# Patient Record
Sex: Female | Born: 1955
Health system: Southern US, Community
[De-identification: ages and names within clinical notes are randomized; demographics above are authoritative.]

## PROBLEM LIST (undated history)

## (undated) DIAGNOSIS — K5790 Diverticulosis of intestine, part unspecified, without perforation or abscess without bleeding: Secondary | ICD-10-CM

## (undated) DIAGNOSIS — K635 Polyp of colon: Secondary | ICD-10-CM

## (undated) DIAGNOSIS — D249 Benign neoplasm of unspecified breast: Secondary | ICD-10-CM

## (undated) DIAGNOSIS — K219 Gastro-esophageal reflux disease without esophagitis: Secondary | ICD-10-CM

## (undated) DIAGNOSIS — Z972 Presence of dental prosthetic device (complete) (partial): Secondary | ICD-10-CM

## (undated) DIAGNOSIS — M549 Dorsalgia, unspecified: Secondary | ICD-10-CM

## (undated) DIAGNOSIS — K449 Diaphragmatic hernia without obstruction or gangrene: Secondary | ICD-10-CM

## (undated) DIAGNOSIS — J449 Chronic obstructive pulmonary disease, unspecified: Secondary | ICD-10-CM

## (undated) DIAGNOSIS — R569 Unspecified convulsions: Secondary | ICD-10-CM

## (undated) DIAGNOSIS — I10 Essential (primary) hypertension: Secondary | ICD-10-CM

## (undated) DIAGNOSIS — G8929 Other chronic pain: Secondary | ICD-10-CM

## (undated) DIAGNOSIS — F3289 Other specified depressive episodes: Secondary | ICD-10-CM

## (undated) DIAGNOSIS — G43909 Migraine, unspecified, not intractable, without status migrainosus: Secondary | ICD-10-CM

## (undated) DIAGNOSIS — B1920 Unspecified viral hepatitis C without hepatic coma: Secondary | ICD-10-CM

## (undated) DIAGNOSIS — F411 Generalized anxiety disorder: Secondary | ICD-10-CM

## (undated) DIAGNOSIS — IMO0002 Reserved for concepts with insufficient information to code with codable children: Secondary | ICD-10-CM

## (undated) DIAGNOSIS — K5792 Diverticulitis of intestine, part unspecified, without perforation or abscess without bleeding: Secondary | ICD-10-CM

## (undated) DIAGNOSIS — M1710 Unilateral primary osteoarthritis, unspecified knee: Secondary | ICD-10-CM

## (undated) DIAGNOSIS — K589 Irritable bowel syndrome without diarrhea: Secondary | ICD-10-CM

## (undated) DIAGNOSIS — K222 Esophageal obstruction: Secondary | ICD-10-CM

## (undated) DIAGNOSIS — J302 Other seasonal allergic rhinitis: Secondary | ICD-10-CM

## (undated) DIAGNOSIS — J381 Polyp of vocal cord and larynx: Secondary | ICD-10-CM

## (undated) DIAGNOSIS — K08109 Complete loss of teeth, unspecified cause, unspecified class: Secondary | ICD-10-CM

## (undated) DIAGNOSIS — M62838 Other muscle spasm: Secondary | ICD-10-CM

## (undated) DIAGNOSIS — G4700 Insomnia, unspecified: Secondary | ICD-10-CM

## (undated) DIAGNOSIS — F329 Major depressive disorder, single episode, unspecified: Secondary | ICD-10-CM

## (undated) HISTORY — DX: Major depressive disorder, single episode, unspecified: F32.9

## (undated) HISTORY — DX: Diverticulitis of intestine, part unspecified, without perforation or abscess without bleeding: K57.92

## (undated) HISTORY — DX: Reserved for concepts with insufficient information to code with codable children: IMO0002

## (undated) HISTORY — DX: Migraine, unspecified, not intractable, without status migrainosus: G43.909

## (undated) HISTORY — PX: OTHER SURGICAL HISTORY: SHX169

## (undated) HISTORY — PX: TUMOR REMOVAL: SHX12

## (undated) HISTORY — PX: ABDOMINAL HYSTERECTOMY: SHX81

## (undated) HISTORY — DX: Unilateral primary osteoarthritis, unspecified knee: M17.10

## (undated) HISTORY — PX: BILATERAL SALPINGOOPHORECTOMY: SHX1223

## (undated) HISTORY — DX: Diaphragmatic hernia without obstruction or gangrene: K44.9

## (undated) HISTORY — DX: Gastro-esophageal reflux disease without esophagitis: K21.9

## (undated) HISTORY — DX: Other specified depressive episodes: F32.89

## (undated) HISTORY — PX: CHOLECYSTECTOMY: SHX55

## (undated) HISTORY — DX: Esophageal obstruction: K22.2

## (undated) HISTORY — DX: Insomnia, unspecified: G47.00

## (undated) HISTORY — DX: Diverticulosis of intestine, part unspecified, without perforation or abscess without bleeding: K57.90

## (undated) HISTORY — PX: RECTAL TUMOR BY PROCTOTOMY EXCISION: SUR550

## (undated) HISTORY — DX: Other muscle spasm: M62.838

## (undated) HISTORY — DX: Generalized anxiety disorder: F41.1

## (undated) HISTORY — DX: Irritable bowel syndrome, unspecified: K58.9

## (undated) HISTORY — PX: BLADDER SURGERY: SHX569

## (undated) HISTORY — DX: Polyp of vocal cord and larynx: J38.1

## (undated) HISTORY — DX: Polyp of colon: K63.5

## (undated) HISTORY — DX: Essential (primary) hypertension: I10

---

## 1998-03-19 ENCOUNTER — Other Ambulatory Visit: Admission: RE | Admit: 1998-03-19 | Discharge: 1998-03-19 | Payer: Self-pay | Admitting: Family Medicine

## 1999-07-30 ENCOUNTER — Other Ambulatory Visit: Admission: RE | Admit: 1999-07-30 | Discharge: 1999-07-30 | Payer: Self-pay

## 2000-08-26 ENCOUNTER — Emergency Department (HOSPITAL_COMMUNITY): Admission: EM | Admit: 2000-08-26 | Discharge: 2000-08-26 | Payer: Self-pay | Admitting: Internal Medicine

## 2000-08-26 ENCOUNTER — Encounter: Payer: Self-pay | Admitting: Internal Medicine

## 2000-10-18 ENCOUNTER — Other Ambulatory Visit: Admission: RE | Admit: 2000-10-18 | Discharge: 2000-10-18 | Payer: Self-pay | Admitting: Family Medicine

## 2001-03-14 ENCOUNTER — Encounter: Admission: RE | Admit: 2001-03-14 | Discharge: 2001-05-02 | Payer: Self-pay | Admitting: Family Medicine

## 2001-12-19 ENCOUNTER — Other Ambulatory Visit: Admission: RE | Admit: 2001-12-19 | Discharge: 2001-12-19 | Payer: Self-pay | Admitting: Family Medicine

## 2003-07-02 ENCOUNTER — Other Ambulatory Visit: Admission: RE | Admit: 2003-07-02 | Discharge: 2003-07-02 | Payer: Self-pay | Admitting: Family Medicine

## 2003-11-06 ENCOUNTER — Emergency Department (HOSPITAL_COMMUNITY): Admission: EM | Admit: 2003-11-06 | Discharge: 2003-11-06 | Payer: Self-pay | Admitting: Emergency Medicine

## 2005-07-05 ENCOUNTER — Ambulatory Visit: Payer: Self-pay | Admitting: Internal Medicine

## 2005-07-08 ENCOUNTER — Ambulatory Visit: Payer: Self-pay | Admitting: Internal Medicine

## 2005-07-08 DIAGNOSIS — K219 Gastro-esophageal reflux disease without esophagitis: Secondary | ICD-10-CM

## 2005-07-08 DIAGNOSIS — K5730 Diverticulosis of large intestine without perforation or abscess without bleeding: Secondary | ICD-10-CM | POA: Insufficient documentation

## 2005-07-08 DIAGNOSIS — K5790 Diverticulosis of intestine, part unspecified, without perforation or abscess without bleeding: Secondary | ICD-10-CM

## 2005-07-08 DIAGNOSIS — K449 Diaphragmatic hernia without obstruction or gangrene: Secondary | ICD-10-CM | POA: Insufficient documentation

## 2005-07-08 DIAGNOSIS — K222 Esophageal obstruction: Secondary | ICD-10-CM

## 2005-07-08 HISTORY — DX: Diverticulosis of intestine, part unspecified, without perforation or abscess without bleeding: K57.90

## 2005-07-08 HISTORY — PX: COLONOSCOPY: SHX174

## 2005-07-08 HISTORY — DX: Gastro-esophageal reflux disease without esophagitis: K21.9

## 2005-07-08 HISTORY — DX: Diaphragmatic hernia without obstruction or gangrene: K44.9

## 2006-11-29 ENCOUNTER — Ambulatory Visit: Payer: Self-pay | Admitting: Internal Medicine

## 2006-11-29 LAB — CONVERTED CEMR LAB
ALT: 24 units/L (ref 0–35)
AST: 21 units/L (ref 0–37)
Albumin: 3.7 g/dL (ref 3.5–5.2)
Alkaline Phosphatase: 38 units/L — ABNORMAL LOW (ref 39–117)
BUN: 5 mg/dL — ABNORMAL LOW (ref 6–23)
Basophils Absolute: 0 10*3/uL (ref 0.0–0.1)
Basophils Relative: 0.2 % (ref 0.0–1.0)
Bilirubin, Direct: 0.3 mg/dL (ref 0.0–0.3)
CO2: 32 meq/L (ref 19–32)
Calcium: 8.9 mg/dL (ref 8.4–10.5)
Chloride: 104 meq/L (ref 96–112)
Creatinine, Ser: 0.8 mg/dL (ref 0.4–1.2)
Eosinophils Absolute: 0 10*3/uL (ref 0.0–0.6)
Eosinophils Relative: 0.9 % (ref 0.0–5.0)
GFR calc Af Amer: 98 mL/min
GFR calc non Af Amer: 81 mL/min
Glucose, Bld: 77 mg/dL (ref 70–99)
HCT: 38.2 % (ref 36.0–46.0)
Hemoglobin: 13.2 g/dL (ref 12.0–15.0)
Lymphocytes Relative: 30.9 % (ref 12.0–46.0)
MCHC: 34.6 g/dL (ref 30.0–36.0)
MCV: 85.7 fL (ref 78.0–100.0)
Monocytes Absolute: 0.3 10*3/uL (ref 0.2–0.7)
Monocytes Relative: 7.1 % (ref 3.0–11.0)
Neutro Abs: 3.1 10*3/uL (ref 1.4–7.7)
Neutrophils Relative %: 60.9 % (ref 43.0–77.0)
Platelets: 173 10*3/uL (ref 150–400)
Potassium: 3.8 meq/L (ref 3.5–5.1)
RBC: 4.46 M/uL (ref 3.87–5.11)
RDW: 12.2 % (ref 11.5–14.6)
Sodium: 140 meq/L (ref 135–145)
TSH: 0.8 microintl units/mL (ref 0.35–5.50)
Total Bilirubin: 1.3 mg/dL — ABNORMAL HIGH (ref 0.3–1.2)
Total Protein: 6.5 g/dL (ref 6.0–8.3)
WBC: 4.9 10*3/uL (ref 4.5–10.5)

## 2006-12-02 ENCOUNTER — Ambulatory Visit: Payer: Self-pay | Admitting: Internal Medicine

## 2006-12-02 DIAGNOSIS — N83209 Unspecified ovarian cyst, unspecified side: Secondary | ICD-10-CM

## 2006-12-02 DIAGNOSIS — D1803 Hemangioma of intra-abdominal structures: Secondary | ICD-10-CM

## 2007-05-08 DIAGNOSIS — F1011 Alcohol abuse, in remission: Secondary | ICD-10-CM | POA: Insufficient documentation

## 2007-05-08 DIAGNOSIS — IMO0002 Reserved for concepts with insufficient information to code with codable children: Secondary | ICD-10-CM | POA: Insufficient documentation

## 2007-05-08 DIAGNOSIS — F329 Major depressive disorder, single episode, unspecified: Secondary | ICD-10-CM

## 2007-05-08 DIAGNOSIS — K589 Irritable bowel syndrome without diarrhea: Secondary | ICD-10-CM

## 2007-05-08 DIAGNOSIS — M949 Disorder of cartilage, unspecified: Secondary | ICD-10-CM

## 2007-05-08 DIAGNOSIS — Z87898 Personal history of other specified conditions: Secondary | ICD-10-CM

## 2007-05-08 DIAGNOSIS — K649 Unspecified hemorrhoids: Secondary | ICD-10-CM | POA: Insufficient documentation

## 2007-05-08 DIAGNOSIS — M1710 Unilateral primary osteoarthritis, unspecified knee: Secondary | ICD-10-CM

## 2007-05-08 DIAGNOSIS — F1921 Other psychoactive substance dependence, in remission: Secondary | ICD-10-CM | POA: Insufficient documentation

## 2007-05-08 DIAGNOSIS — M899 Disorder of bone, unspecified: Secondary | ICD-10-CM | POA: Insufficient documentation

## 2007-05-08 DIAGNOSIS — F411 Generalized anxiety disorder: Secondary | ICD-10-CM

## 2007-07-05 ENCOUNTER — Encounter: Admission: RE | Admit: 2007-07-05 | Discharge: 2007-08-07 | Payer: Self-pay | Admitting: Family Medicine

## 2008-11-25 ENCOUNTER — Ambulatory Visit (HOSPITAL_COMMUNITY): Admission: RE | Admit: 2008-11-25 | Discharge: 2008-11-25 | Payer: Self-pay | Admitting: Family Medicine

## 2008-12-27 ENCOUNTER — Ambulatory Visit (HOSPITAL_COMMUNITY): Admission: RE | Admit: 2008-12-27 | Discharge: 2008-12-27 | Payer: Self-pay | Admitting: Neurology

## 2009-12-03 ENCOUNTER — Emergency Department (HOSPITAL_COMMUNITY): Admission: EM | Admit: 2009-12-03 | Discharge: 2009-12-04 | Payer: Self-pay | Admitting: Emergency Medicine

## 2010-05-14 LAB — LIPID PANEL
Cholesterol: 109 mg/dL (ref 0–200)
LDL Cholesterol: 44 mg/dL (ref 0–99)
Total CHOL/HDL Ratio: 2.5 RATIO
Triglycerides: 106 mg/dL (ref <150)
VLDL: 21 mg/dL (ref 0–40)

## 2010-05-14 LAB — POCT CARDIAC MARKERS
CKMB, poc: <1 ng/mL — ABNORMAL LOW (ref 1.0–8.0)
CKMB, poc: <1 ng/mL — ABNORMAL LOW (ref 1.0–8.0)
Myoglobin, poc: 48.5 ng/mL (ref 12–200)
Myoglobin, poc: 67.2 ng/mL (ref 12–200)
Troponin i, poc: <0.05 ng/mL (ref 0.00–0.09)

## 2010-05-14 LAB — URINE CULTURE

## 2010-05-14 LAB — BASIC METABOLIC PANEL
BUN: 5 mg/dL — ABNORMAL LOW (ref 6–23)
CO2: 28 mEq/L (ref 19–32)
Chloride: 105 mEq/L (ref 96–112)
Creatinine, Ser: 0.69 mg/dL (ref 0.4–1.2)

## 2010-05-14 LAB — DIFFERENTIAL
Basophils Absolute: 0.1 10*3/uL (ref 0.0–0.1)
Basophils Relative: 2 % — ABNORMAL HIGH (ref 0–1)
Eosinophils Absolute: 0.1 10*3/uL (ref 0.0–0.7)
Eosinophils Relative: 2 % (ref 0–5)

## 2010-05-14 LAB — CBC
MCH: 29.4 pg (ref 26.0–34.0)
MCHC: 34.3 g/dL (ref 30.0–36.0)
MCV: 85.6 fL (ref 78.0–100.0)
Platelets: 188 10*3/uL (ref 150–400)
RDW: 13.5 % (ref 11.5–15.5)

## 2010-05-14 LAB — URINALYSIS, ROUTINE W REFLEX MICROSCOPIC
Bilirubin Urine: NEGATIVE
Hgb urine dipstick: NEGATIVE
Ketones, ur: NEGATIVE mg/dL
Protein, ur: NEGATIVE mg/dL
Urobilinogen, UA: 0.2 mg/dL (ref 0.0–1.0)

## 2010-05-14 LAB — TSH: TSH: 2.27 u[IU]/mL (ref 0.350–4.500)

## 2010-07-14 NOTE — Assessment & Plan Note (Signed)
[facility]                         GASTROENTEROLOGY OFFICE NOTE   KINLEIGH, NAULT                         MRN:          [id number]  DATE:11/29/2006                            DOB:          1955/06/30    REASON FOR CONSULTATION:  Abdominal pain.   HISTORY:  This is a 55 year old white female with a history of  anxiety/depression, alcohol and drug abuse, gastroesophageal reflux  disease, irritable bowel syndrome, chronic back pain, prior  cholecystectomy and multiple general abdominal complaints.  She was last  evaluated in this office Jul 05, 2005 for hoarseness and multiple  abdominal complaints.  See that dictation for details.  She underwent  complete colonoscopy which was normal except for sigmoid diverticulosis.  Upper endoscopy was also performed, she was found to have a distal  esophageal stricture due to reflux disease and a hiatal hernia.  No  other abnormalities.  The stricture was dilated for complaints of  dysphagia.  She was kept on proton pump inhibitors.  Because of  complaints of hoarseness she did indeed see an ear, nose and throat  specialist and tells me that she had polyps removed from her vocal  cords.  Today she reports a 1 month history of severe epigastric and  right upper quadrant pain.  Also, more generalized abdominal pain.  She  describes thickness or mucus in the region of the throat.  No true  dysphagia.  She has some nausea, done no vomiting.  She is currently on  omeprazole 20 mg daily for reflux.  She does describe breakthrough  symptoms.  She denies melena or hematochezia.  Alternating bowel habits  are unchanged.  SHE IS INTOLERANT TO CODEINE.   CURRENT MEDICATIONS:  Vicodin, Singulair, Premarin, Xopenex, Valium,  omeprazole, prostig, and Fosamax.   PAST MEDICAL HISTORY:  As above.   PAST SURGICAL HISTORY:  1. Cholecystectomy.  2. Hysterectomy.  3. Bladder tack.   FAMILY HISTORY:  Diabetes, alcoholism,  prostate cancer, heart disease.  No history of GI malignancy.   SOCIAL HISTORY:  Patient is divorced with 1 daughter, she has a GED.  She is unemployed.  Denies current alcohol or drug use.   PHYSICAL EXAMINATION:  Thin, somewhat depressed appearing female in no  acute distress.  Blood pressure 104/66, heart rate 68, weight is 117  pounds (decreased 6 pounds).  She is 5 feet 2 inches in height.  HEENT:  Sclerae are anicteric, conjunctivae are pink, oral mucosa  intact, no adenopathy.  LUNGS:  Clear.  HEART:  Regular.  ABDOMEN:  Soft with complaints of tenderness to palpation in the mid-  epigastric region.  No masses felt, good bowel sounds heard.  EXTREMITIES:  Without edema.   IMPRESSION:  1. Gastroesophageal reflux disease.  Breakthrough symptoms on once      daily omeprazole.  2. History of peptic stricture with dysphagia.  Currently with a mild      globus sensation though no esophageal dysphagia.  3. Chronic abdominal complaints with irritable bowel.  Today with      epigastric and right upper quadrant pain likely due to  the same.  4. History of anxiety and depression with a tendency towards panic      disorder.  5. Chronic pain syndrome, on Vicodin.   RECOMMENDATIONS:  1. Increase Prilosec to 20 mg b.i.d.  2. Strict adherence to reflux precautions.  3. Screening laboratories today including CBC, comprehensive metabolic      panel, thyroid stimulating hormone.  4. Schedule CT scan of the abdomen and pelvis to further evaluate      abdominal pelvic pain.  5. Ongoing general medical care with [facility].     Wilhemina Bonito. Marina Goodell, MD  Electronically Signed    JNP/MedQ  DD: 11/29/2006  DT: 11/29/2006  Job #: [id number]   cc:   Lindaann Pascal, PA-C

## 2010-09-30 ENCOUNTER — Ambulatory Visit: Payer: Self-pay | Admitting: Internal Medicine

## 2010-10-05 ENCOUNTER — Encounter: Payer: Self-pay | Admitting: Internal Medicine

## 2010-10-05 ENCOUNTER — Ambulatory Visit (INDEPENDENT_AMBULATORY_CARE_PROVIDER_SITE_OTHER): Payer: Medicare Other | Admitting: Internal Medicine

## 2010-10-05 VITALS — BP 126/74 | HR 82 | Ht 62.0 in | Wt 160.0 lb

## 2010-10-05 DIAGNOSIS — R109 Unspecified abdominal pain: Secondary | ICD-10-CM

## 2010-10-05 DIAGNOSIS — K222 Esophageal obstruction: Secondary | ICD-10-CM

## 2010-10-05 DIAGNOSIS — K219 Gastro-esophageal reflux disease without esophagitis: Secondary | ICD-10-CM

## 2010-10-05 DIAGNOSIS — K589 Irritable bowel syndrome without diarrhea: Secondary | ICD-10-CM

## 2010-10-05 DIAGNOSIS — G8929 Other chronic pain: Secondary | ICD-10-CM

## 2010-10-05 NOTE — Progress Notes (Signed)
HISTORY OF PRESENT ILLNESS:  Teresa [name] is a 55 y.o. female with a history of anxiety/depression, alcohol and drug abuse, GERD, peptic stricture, irritable bowel syndrome, chronic back pain, chronic abdominal pain, and multiple prior surgeries including cholecystectomy and hysterectomy. Last evaluated in this office September of 2008 for abdominal pain. See that dictation. CT scan of the abdomen and pelvis as well as routine blood work was unremarkable. The patient last underwent upper endoscopy with esophageal dilation in May of 2007. Colonoscopy in May of 2007 was normal except for sigmoid diverticulosis. Prior endoscopy and colonoscopy also performed in 2000. Patient's GI review of systems is essentially positive. For her reflux she is on Dexilant. She does report some breakthrough. Mild dysphagia. Chief complaint is lower abdominal pain. As well alternating bowel habits with a tendency toward constipation. For constipation she takes MiraLax. This helps. No worrisome features such as rectal bleeding or weight loss. She feels the symptoms are similar to those she said for the past decade or greater. Recent evaluation by the primary provider included unremarkable abdominal films. Blood work in January was normal except for mild elevation of hepatic transaminases.  REVIEW OF SYSTEMS:  All non-GI ROS negative except for sinus and allergy trouble, anxiety, arthritis, back pain, fatigue, headaches, itching, muscle cramps, shortness of breath, excessive thirst, and urinary leakage  Past Medical History  Diagnosis Date  . Diverticulosis 07-08-2005    Colonoscopy  . Stricture and stenosis of esophagus 07-08-2005    EGD  . Hiatal hernia 07-08-2005    EGD  . GERD (gastroesophageal reflux disease) 07-08-2005    EGD  . Irritable bowel syndrome   . Vocal cord polyp   . Unspecified hemorrhoids without mention of complication   . Other and unspecified ovarian cyst   . Hemangioma of intra-abdominal structures    . Degeneration of intervertebral disc, site unspecified   . Osteoarthrosis, unspecified whether generalized or localized, lower leg   . Osteopenia   . Unspecified drug dependence, in remission   . Depressive disorder, not elsewhere classified   . Anxiety state, unspecified   . Asthma     Past Surgical History  Procedure Date  . Cholecystectomy   . Abdominal hysterectomy   . Bladder surgery     Tack   . Rectal tumor by proctotomy excision     Social History Teresa [name]  reports that she has quit smoking. She has never used smokeless tobacco. She reports that she does not drink alcohol or use illicit drugs.  family history includes Diabetes in her maternal grandmother; Heart disease in her paternal grandmother; Irritable bowel syndrome in an unspecified family member; Prostate cancer in her father; and Stomach cancer in her maternal uncle.  No Known Allergies     PHYSICAL EXAMINATION: Vital signs: BP 126/74  Pulse 82  Ht 5\' 2"  (1.575 m)  Wt 160 lb (72.576 kg)  BMI 29.26 kg/m2  Constitutional: generally well-appearing, no acute distress Psychiatric: alert and oriented x3, cooperative Eyes: extraocular movements intact, anicteric, conjunctiva pink Mouth: oral pharynx moist, no lesions Neck: supple no lymphadenopathy Cardiovascular: heart regular rate and rhythm, no murmur Lungs: clear to auscultation bilaterally Abdomen: soft, obese, nontender, nondistended, no obvious ascites, no peritoneal signs, normal bowel sounds, no organomegaly Rectal: soft light brown Hemoccult negative stool. No tenderness or mass. Extremities: no lower extremity edema bilaterally Skin: no lesions on visible extremities Neuro: No focal deficits. No asterixis.    ASSESSMENT:  #1. Chronic functional abdominal pain. Ongoing. Prior EXTENSIVE workups  as outlined. No alarm features. #2. Alternating bowel habits and bloating consistent with irritable bowel #3. GERD complicated by peptic  stricture #4. Screening colonoscopy up to date. Due for followup in 2017    PLAN:  #1. No further workup planned #2. Discussions today on functional abdominal pain with the patient #3. Continue MiraLax when necessary constipation #4. Continue Dexilant and reflux precautions #5. Return to the care of your primary provider

## 2010-10-06 ENCOUNTER — Encounter: Payer: Self-pay | Admitting: Internal Medicine

## 2011-03-05 ENCOUNTER — Ambulatory Visit (INDEPENDENT_AMBULATORY_CARE_PROVIDER_SITE_OTHER): Payer: Medicare Other | Admitting: Urology

## 2011-03-05 DIAGNOSIS — R339 Retention of urine, unspecified: Secondary | ICD-10-CM | POA: Diagnosis not present

## 2011-03-05 DIAGNOSIS — N816 Rectocele: Secondary | ICD-10-CM | POA: Diagnosis not present

## 2011-03-17 DIAGNOSIS — R39198 Other difficulties with micturition: Secondary | ICD-10-CM | POA: Diagnosis not present

## 2011-03-17 DIAGNOSIS — N8110 Cystocele, unspecified: Secondary | ICD-10-CM | POA: Diagnosis not present

## 2011-03-17 DIAGNOSIS — N3946 Mixed incontinence: Secondary | ICD-10-CM | POA: Diagnosis not present

## 2011-03-17 DIAGNOSIS — R339 Retention of urine, unspecified: Secondary | ICD-10-CM | POA: Diagnosis not present

## 2011-04-13 DIAGNOSIS — K219 Gastro-esophageal reflux disease without esophagitis: Secondary | ICD-10-CM | POA: Diagnosis not present

## 2011-04-13 DIAGNOSIS — J40 Bronchitis, not specified as acute or chronic: Secondary | ICD-10-CM | POA: Diagnosis not present

## 2011-04-15 DIAGNOSIS — G5600 Carpal tunnel syndrome, unspecified upper limb: Secondary | ICD-10-CM | POA: Diagnosis not present

## 2011-04-15 DIAGNOSIS — M542 Cervicalgia: Secondary | ICD-10-CM | POA: Diagnosis not present

## 2011-04-15 DIAGNOSIS — R51 Headache: Secondary | ICD-10-CM | POA: Diagnosis not present

## 2011-04-21 DIAGNOSIS — K219 Gastro-esophageal reflux disease without esophagitis: Secondary | ICD-10-CM | POA: Diagnosis not present

## 2011-04-28 DIAGNOSIS — N3946 Mixed incontinence: Secondary | ICD-10-CM | POA: Diagnosis not present

## 2011-04-28 DIAGNOSIS — N816 Rectocele: Secondary | ICD-10-CM | POA: Diagnosis not present

## 2011-04-30 ENCOUNTER — Other Ambulatory Visit: Payer: Self-pay | Admitting: Urology

## 2011-05-07 DIAGNOSIS — H8309 Labyrinthitis, unspecified ear: Secondary | ICD-10-CM | POA: Diagnosis not present

## 2011-05-07 DIAGNOSIS — J069 Acute upper respiratory infection, unspecified: Secondary | ICD-10-CM | POA: Diagnosis not present

## 2011-05-13 DIAGNOSIS — Z79899 Other long term (current) drug therapy: Secondary | ICD-10-CM | POA: Diagnosis not present

## 2011-05-13 DIAGNOSIS — F329 Major depressive disorder, single episode, unspecified: Secondary | ICD-10-CM | POA: Diagnosis not present

## 2011-05-13 DIAGNOSIS — R5381 Other malaise: Secondary | ICD-10-CM | POA: Diagnosis not present

## 2011-05-13 DIAGNOSIS — M545 Low back pain: Secondary | ICD-10-CM | POA: Diagnosis not present

## 2011-05-13 DIAGNOSIS — M542 Cervicalgia: Secondary | ICD-10-CM | POA: Diagnosis not present

## 2011-05-13 DIAGNOSIS — G5600 Carpal tunnel syndrome, unspecified upper limb: Secondary | ICD-10-CM | POA: Diagnosis not present

## 2011-05-13 DIAGNOSIS — R51 Headache: Secondary | ICD-10-CM | POA: Diagnosis not present

## 2011-05-13 DIAGNOSIS — R5383 Other fatigue: Secondary | ICD-10-CM | POA: Diagnosis not present

## 2011-05-24 DIAGNOSIS — Z1231 Encounter for screening mammogram for malignant neoplasm of breast: Secondary | ICD-10-CM | POA: Diagnosis not present

## 2011-05-24 DIAGNOSIS — R922 Inconclusive mammogram: Secondary | ICD-10-CM | POA: Diagnosis not present

## 2011-06-11 DIAGNOSIS — R928 Other abnormal and inconclusive findings on diagnostic imaging of breast: Secondary | ICD-10-CM | POA: Diagnosis not present

## 2011-06-11 DIAGNOSIS — N63 Unspecified lump in unspecified breast: Secondary | ICD-10-CM | POA: Diagnosis not present

## 2011-06-15 DIAGNOSIS — F329 Major depressive disorder, single episode, unspecified: Secondary | ICD-10-CM | POA: Diagnosis not present

## 2011-06-15 DIAGNOSIS — M545 Low back pain: Secondary | ICD-10-CM | POA: Diagnosis not present

## 2011-06-15 DIAGNOSIS — Z79899 Other long term (current) drug therapy: Secondary | ICD-10-CM | POA: Diagnosis not present

## 2011-06-15 DIAGNOSIS — R252 Cramp and spasm: Secondary | ICD-10-CM | POA: Diagnosis not present

## 2011-06-15 DIAGNOSIS — G479 Sleep disorder, unspecified: Secondary | ICD-10-CM | POA: Diagnosis not present

## 2011-06-15 DIAGNOSIS — R5383 Other fatigue: Secondary | ICD-10-CM | POA: Diagnosis not present

## 2011-06-24 DIAGNOSIS — K2900 Acute gastritis without bleeding: Secondary | ICD-10-CM | POA: Diagnosis not present

## 2011-06-24 DIAGNOSIS — H60509 Unspecified acute noninfective otitis externa, unspecified ear: Secondary | ICD-10-CM | POA: Diagnosis not present

## 2011-07-06 DIAGNOSIS — H60509 Unspecified acute noninfective otitis externa, unspecified ear: Secondary | ICD-10-CM | POA: Diagnosis not present

## 2011-07-06 DIAGNOSIS — J019 Acute sinusitis, unspecified: Secondary | ICD-10-CM | POA: Diagnosis not present

## 2011-07-12 DIAGNOSIS — R5381 Other malaise: Secondary | ICD-10-CM | POA: Diagnosis not present

## 2011-07-12 DIAGNOSIS — M545 Low back pain: Secondary | ICD-10-CM | POA: Diagnosis not present

## 2011-07-12 DIAGNOSIS — R5383 Other fatigue: Secondary | ICD-10-CM | POA: Diagnosis not present

## 2011-07-12 DIAGNOSIS — G479 Sleep disorder, unspecified: Secondary | ICD-10-CM | POA: Diagnosis not present

## 2011-07-12 DIAGNOSIS — Z79899 Other long term (current) drug therapy: Secondary | ICD-10-CM | POA: Diagnosis not present

## 2011-07-19 DIAGNOSIS — D249 Benign neoplasm of unspecified breast: Secondary | ICD-10-CM | POA: Diagnosis not present

## 2011-07-19 DIAGNOSIS — R928 Other abnormal and inconclusive findings on diagnostic imaging of breast: Secondary | ICD-10-CM | POA: Diagnosis not present

## 2011-07-19 DIAGNOSIS — N6089 Other benign mammary dysplasias of unspecified breast: Secondary | ICD-10-CM | POA: Diagnosis not present

## 2011-08-04 DIAGNOSIS — Z79899 Other long term (current) drug therapy: Secondary | ICD-10-CM | POA: Diagnosis not present

## 2011-08-04 DIAGNOSIS — G479 Sleep disorder, unspecified: Secondary | ICD-10-CM | POA: Diagnosis not present

## 2011-08-04 DIAGNOSIS — M545 Low back pain, unspecified: Secondary | ICD-10-CM | POA: Diagnosis not present

## 2011-08-18 ENCOUNTER — Other Ambulatory Visit: Payer: Self-pay | Admitting: Urology

## 2011-08-18 DIAGNOSIS — J309 Allergic rhinitis, unspecified: Secondary | ICD-10-CM | POA: Diagnosis not present

## 2011-08-18 DIAGNOSIS — J019 Acute sinusitis, unspecified: Secondary | ICD-10-CM | POA: Diagnosis not present

## 2011-08-19 ENCOUNTER — Encounter (HOSPITAL_COMMUNITY): Payer: Self-pay | Admitting: Pharmacy Technician

## 2011-08-20 ENCOUNTER — Encounter (HOSPITAL_COMMUNITY)
Admission: RE | Admit: 2011-08-20 | Discharge: 2011-08-20 | Disposition: A | Discharge status: Home / Self Care | Payer: Medicare Other | Source: Ambulatory Visit | Attending: Urology | Admitting: Urology

## 2011-08-20 ENCOUNTER — Encounter (INDEPENDENT_AMBULATORY_CARE_PROVIDER_SITE_OTHER): Payer: Self-pay

## 2011-08-20 ENCOUNTER — Encounter (HOSPITAL_COMMUNITY): Payer: Self-pay

## 2011-08-20 ENCOUNTER — Ambulatory Visit (HOSPITAL_COMMUNITY)
Admission: RE | Admit: 2011-08-20 | Discharge: 2011-08-20 | Disposition: A | Discharge status: Home / Self Care | Payer: Medicare Other | Source: Ambulatory Visit | Attending: Urology | Admitting: Urology

## 2011-08-20 DIAGNOSIS — Z01811 Encounter for preprocedural respiratory examination: Secondary | ICD-10-CM | POA: Diagnosis not present

## 2011-08-20 LAB — COMPREHENSIVE METABOLIC PANEL
ALT: 40 U/L — ABNORMAL HIGH (ref 0–35)
Albumin: 3.9 g/dL (ref 3.5–5.2)
Alkaline Phosphatase: 98 U/L (ref 39–117)
BUN: 7 mg/dL (ref 6–23)
Chloride: 103 mEq/L (ref 96–112)
GFR calc Af Amer: >90 mL/min (ref >90)
Glucose, Bld: 87 mg/dL (ref 70–99)
Potassium: 4.1 mEq/L (ref 3.5–5.1)
Sodium: 140 mEq/L (ref 135–145)
Total Bilirubin: 1 mg/dL (ref 0.3–1.2)

## 2011-08-20 LAB — CBC
HCT: 39.9 % (ref 36.0–46.0)
MCV: 83.1 fL (ref 78.0–100.0)
RBC: 4.8 MIL/uL (ref 3.87–5.11)
WBC: 6.8 10*3/uL (ref 4.0–10.5)

## 2011-08-20 LAB — ABO/RH: ABO/RH(D): A POS

## 2011-08-20 NOTE — Patient Instructions (Signed)
[name]  08/20/2011   Your procedure is scheduled on: 08/24/11 AT 7:30 AM  Report to SHORT STAY DEPT  at 5:30 AM.  Call this number if you have problems the morning of surgery: [phone]   Remember:   Do not eat food or drink liquids AFTER MIDNIGHT    Take these medicines the morning of surgery with A SIP OF WATER: CETRIZINE / DEXALANT / TAMSULOSIN / ATIVAN IF NEEDED   Do not wear jewelry, make-up or nail polish.  Do not wear lotions, powders, or perfumes.   Do not shave legs or underarms 48 hrs. before surgery (men may shave face)  Do not bring valuables to the hospital.  Contacts, dentures or bridgework may not be worn into surgery.  Leave suitcase in the car. After surgery it may be brought to your room.  For patients admitted to the hospital, checkout time is 11:00 AM the day of discharge.   Patients discharged the day of surgery will not be allowed to drive home.    Special Instructions:   Please read over the following fact sheets that you were given: MRSA  Information               SHOWER WITH BETASEPT THE NIGHT BEFORE SURGERY AND THE MORNING OF SURGERY

## 2011-08-23 NOTE — H&P (Signed)
History of Present Illness   Teresa [name] has mild mixed stress urge incontinence. When she does time voiding she stays almost pad free. She does not want to proceed with a sling and I do not think she should.  She does have a slow flow possibly due to a rectocele but the Rapaflo is helping some. She does have significant flow symptoms and sometimes has to push on the suprapubic area plus or minus strain to urinate.   She has had a hysterectomy and bladder suspension in the past.   She says the vaginal bulging is less on estrogen cream. She still would like to have something done.  I drew her a picture and talked to her about a rectocele repair and an intraoperative inspection for an enterocele, which I do not think she has. She has mild loss of vaginal cuff support and she likely would benefit from a rectocele repair with graft and iliococcygeus repair. She understands it may or may not help her flow. It will not improve her voiding dysfunction.   She has had urodynamics and did have a lower leak-point pressure and significant instability. She had poor bladder contractility.  I drew her a picture and we talked about prolapse surgery in detail. Pros, cons, general surgical and anesthetic risks, and other options including behavioral therapy, pessaries, and watchful waiting were discussed. She understands that prolapse repairs are successful in 80-85% of cases for prolapse symptoms and can recur anteriorly, posteriorly, and/or apically. She understands that in most cases I use a graft and general risks were discussed. Surgical risks were described but not limited to the discussion of injury to neighboring structures including the bowel (with possible life-threatening sepsis and colostomy), bladder, urethra, vagina (all resulting in further surgery), and ureter (resulting in re-implantation). We talked about injury to nerves/soft tissue leading to debilitating and intractable pelvic, abdominal, and  lower extremity pain syndromes and neuropathies. The risks of buttock pain, intractable dyspareunia, and vaginal narrowing and shortening with sequelae were discussed. Bleeding risks, transfusion rates, and infection were discussed. The risk of persistent, de novo, or worsening bladder and/or bowel incontinence/dysfunction was discussed. The need for CIC was described as well the usual post-operative course. The patient understands that she might not reach her treatment goal and that she might be worse following surgery.  Review of systems: No other change in bowel or neurologic status.   Urinalysis: I reviewed, negative.    Past Medical History Problems  1. History of  Anxiety (Symptom) 300.00 2. History of  Arthritis V13.4 3. History of  Asthma 493.90 4. History of  Depression 311 5. History of  Esophageal Reflux 530.81 6. Former Smoker V15.82 7. History of  Gastric Ulcer 531.90 8. History of  Lower Back Pain Chronic 9. History of  Osteoporosis 733.00 10. History of  Seizure 11. History of  Urge And Stress Incontinence 788.33  Surgical History Problems  1. History of  Bladder Surgery 2. History of  Gallbladder Surgery 3. History of  Hysterectomy V45.77 4. History of  Oophorectomy - Bilateral (Removal Of Both Ovaries)  Current Meds 1. Amitiza 8 MCG Oral Capsule; Therapy: (Recorded:04Dec2012) to 2. Cal-Citrate CAPS; Therapy: (Recorded:04Dec2012) to 3. Cetirizine HCl 10 MG Oral Tablet; Therapy: (Recorded:04Dec2012) to 4. Cranberry TABS; Therapy: (Recorded:04Dec2012) to 5. Dexilant 60 MG Oral Capsule Delayed Release; Therapy: (Recorded:04Dec2012) to 6. Estrace 0.1 MG/GM Vaginal Cream; 1 gm 3x/week x 4 weeks, then 1x per week; Therapy:  16Jan2013 to (Last Rx:16Jan2013)  Requested for: 16Jan2013 7.  Fiber Therapy CAPS; Therapy: (Recorded:04Dec2012) to 8. Flexeril 10 MG Oral Tablet; Therapy: (Recorded:04Dec2012) to 9. Fluticasone Propionate 50 MCG/ACT Nasal Suspension; Therapy:  12Feb2013 to 10. LORazepam 1 MG Oral Tablet; Therapy: (Recorded:04Dec2012) to 11. MiraLax Oral Powder; Therapy: (Recorded:04Dec2012) to 12. Premarin 0.625 MG/GM Vaginal Cream; Therapy: 16Jan2013 to 13. ProAir HFA AERS; Therapy: (Recorded:04Dec2012) to 14. Singulair 10 MG Oral Tablet; Therapy: (Recorded:04Dec2012) to 15. TraMADol HCl 50 MG Oral Tablet; Therapy: (Recorded:04Dec2012) to  Allergies No Known Allergies  1. No Known Allergies  Family History Problems  1. Family history of  Family Health Status - Mother's Age 24 2. Family history of  Family Health Status Father 62 3. Paternal history of  Hematuria 4. Paternal history of  Prostate Cancer V16.42 5. Family history of  Renal Failure 6. Family history of  Stroke Syndrome V17.1  Social History Problems  1. Caffeine Use 1 or less daily 2. Former Smoker V15.82 3. Marital History - Divorced V61.03 4. Occupation: Disabled Denied  5. History of  Alcohol Use  Vitals Vital Signs [Data Includes: Last 1 Day]  27Feb2013 09:16AM  Blood Pressure: 127 / 85 Temperature: 97.6 F Heart Rate: 102  Results/Data  Urine [Data Includes: Last 1 Day]   27Feb2013  COLOR STRAW   APPEARANCE CLEAR   SPECIFIC GRAVITY <1.005   pH 6.0   GLUCOSE NEG mg/dL  BILIRUBIN NEG   KETONE NEG mg/dL  BLOOD TRACE   PROTEIN NEG mg/dL  UROBILINOGEN 0.2 mg/dL  NITRITE NEG   LEUKOCYTE ESTERASE NEG   SQUAMOUS EPITHELIAL/HPF RARE   WBC NONE SEEN WBC/hpf  RBC NONE SEEN RBC/hpf  BACTERIA NONE SEEN   CRYSTALS NONE SEEN   CASTS NONE SEEN    Assessment Assessed  1. Rectocele 596.89 2. Urge And Stress Incontinence 788.33  Plan   Discussion/Summary   Rapaflo samples and prescription given. Estrace samples and prescription given. She will continue on MiraLax and/or Colace which is helping. She will schedule surgery.  After a thorough review of the management options for the patient's condition the patient  elected to proceed with surgical  therapy as noted above. We have discussed the potential benefits and risks of the procedure, side effects of the proposed treatment, the likelihood of the patient achieving the goals of the procedure, and any potential problems that might occur during the procedure or recuperation. Informed consent has been obtained.

## 2011-08-24 ENCOUNTER — Encounter (HOSPITAL_COMMUNITY): Payer: Self-pay | Admitting: Anesthesiology

## 2011-08-24 ENCOUNTER — Encounter (HOSPITAL_COMMUNITY): Payer: Self-pay | Admitting: *Deleted

## 2011-08-24 ENCOUNTER — Encounter (HOSPITAL_COMMUNITY)
Admission: RE | Disposition: A | Discharge status: Home / Self Care | Payer: Self-pay | Source: Ambulatory Visit | Attending: Urology

## 2011-08-24 ENCOUNTER — Ambulatory Visit (HOSPITAL_COMMUNITY)
Admission: RE | Admit: 2011-08-24 | Discharge: 2011-08-25 | Disposition: A | Discharge status: Home / Self Care | Payer: Medicare Other | Source: Ambulatory Visit | Attending: Urology | Admitting: Urology

## 2011-08-24 ENCOUNTER — Ambulatory Visit (HOSPITAL_COMMUNITY): Payer: Medicare Other | Admitting: Anesthesiology

## 2011-08-24 DIAGNOSIS — Z90710 Acquired absence of both cervix and uterus: Secondary | ICD-10-CM | POA: Diagnosis not present

## 2011-08-24 DIAGNOSIS — Z79899 Other long term (current) drug therapy: Secondary | ICD-10-CM | POA: Insufficient documentation

## 2011-08-24 DIAGNOSIS — K219 Gastro-esophageal reflux disease without esophagitis: Secondary | ICD-10-CM | POA: Diagnosis not present

## 2011-08-24 DIAGNOSIS — N8110 Cystocele, unspecified: Secondary | ICD-10-CM | POA: Insufficient documentation

## 2011-08-24 DIAGNOSIS — N3946 Mixed incontinence: Secondary | ICD-10-CM | POA: Insufficient documentation

## 2011-08-24 DIAGNOSIS — N816 Rectocele: Secondary | ICD-10-CM | POA: Insufficient documentation

## 2011-08-24 DIAGNOSIS — M810 Age-related osteoporosis without current pathological fracture: Secondary | ICD-10-CM | POA: Insufficient documentation

## 2011-08-24 DIAGNOSIS — J45909 Unspecified asthma, uncomplicated: Secondary | ICD-10-CM | POA: Insufficient documentation

## 2011-08-24 DIAGNOSIS — K5730 Diverticulosis of large intestine without perforation or abscess without bleeding: Secondary | ICD-10-CM | POA: Diagnosis not present

## 2011-08-24 HISTORY — PX: CYSTOSCOPY: SHX5120

## 2011-08-24 HISTORY — PX: RECTOCELE REPAIR: SHX761

## 2011-08-24 HISTORY — PX: VAGINAL PROLAPSE REPAIR: SHX830

## 2011-08-24 LAB — TYPE AND SCREEN
ABO/RH(D): A POS
Antibody Screen: NEGATIVE

## 2011-08-24 LAB — HEMOGLOBIN AND HEMATOCRIT, BLOOD
HCT: 37.4 % (ref 36.0–46.0)
Hemoglobin: 12.4 g/dL (ref 12.0–15.0)

## 2011-08-24 SURGERY — CYSTOSCOPY
Anesthesia: General | Site: Vagina | Wound class: Clean Contaminated

## 2011-08-24 MED ORDER — PANTOPRAZOLE SODIUM 40 MG PO TBEC
40.0000 mg | DELAYED_RELEASE_TABLET | Freq: Every day | ORAL | Status: DC
  Administered 2011-08-25: 40 mg via ORAL
  Filled 2011-08-24: qty 1

## 2011-08-24 MED ORDER — LACTATED RINGERS IV SOLN: INTRAVENOUS | Status: DC

## 2011-08-24 MED ORDER — STERILE WATER FOR IRRIGATION IR SOLN
Status: DC | PRN
  Administered 2011-08-24: 1000 mL via INTRAVESICAL

## 2011-08-24 MED ORDER — HYDROMORPHONE HCL PF 1 MG/ML IJ SOLN
0.2500 mg | INTRAMUSCULAR | Status: DC | PRN
  Administered 2011-08-24 (×2): 0.5 mg via INTRAVENOUS

## 2011-08-24 MED ORDER — TRAMADOL HCL 50 MG PO TABS
50.0000 mg | ORAL_TABLET | ORAL | Status: DC | PRN
  Administered 2011-08-25: 50 mg via ORAL
  Filled 2011-08-24: qty 1

## 2011-08-24 MED ORDER — PROPOFOL 10 MG/ML IV BOLUS
INTRAVENOUS | Status: DC | PRN
  Administered 2011-08-24: 175 mg via INTRAVENOUS

## 2011-08-24 MED ORDER — CYCLOBENZAPRINE HCL 10 MG PO TABS
10.0000 mg | ORAL_TABLET | Freq: Three times a day (TID) | ORAL | Status: DC | PRN
  Filled 2011-08-24: qty 1

## 2011-08-24 MED ORDER — LIDOCAINE-EPINEPHRINE 1 %-1:100000 IJ SOLN
INTRAMUSCULAR | Status: AC
  Filled 2011-08-24: qty 1

## 2011-08-24 MED ORDER — HYDROMORPHONE HCL PF 1 MG/ML IJ SOLN
INTRAMUSCULAR | Status: AC
  Administered 2011-08-24: 11:00:00
  Filled 2011-08-24: qty 1

## 2011-08-24 MED ORDER — GLYCOPYRROLATE 0.2 MG/ML IJ SOLN
INTRAMUSCULAR | Status: DC | PRN
  Administered 2011-08-24: 0.6 mg via INTRAVENOUS

## 2011-08-24 MED ORDER — ESTRADIOL 0.1 MG/GM VA CREA
TOPICAL_CREAM | VAGINAL | Status: AC
  Filled 2011-08-24: qty 85

## 2011-08-24 MED ORDER — MORPHINE SULFATE 10 MG/ML IJ SOLN
INTRAMUSCULAR | Status: AC
  Administered 2011-08-24: 11:00:00
  Filled 2011-08-24: qty 1

## 2011-08-24 MED ORDER — FENTANYL CITRATE 0.05 MG/ML IJ SOLN
INTRAMUSCULAR | Status: DC | PRN
  Administered 2011-08-24 (×2): 50 ug via INTRAVENOUS
  Administered 2011-08-24: 25 ug via INTRAVENOUS
  Administered 2011-08-24 (×2): 50 ug via INTRAVENOUS
  Administered 2011-08-24: 100 ug via INTRAVENOUS
  Administered 2011-08-24: 50 ug via INTRAVENOUS
  Administered 2011-08-24: 25 ug via INTRAVENOUS
  Administered 2011-08-24: 50 ug via INTRAVENOUS

## 2011-08-24 MED ORDER — INDIGOTINDISULFONATE SODIUM 8 MG/ML IJ SOLN
INTRAMUSCULAR | Status: AC
  Filled 2011-08-24: qty 10

## 2011-08-24 MED ORDER — PROMETHAZINE HCL 25 MG/ML IJ SOLN: 6.2500 mg | INTRAMUSCULAR | Status: DC | PRN

## 2011-08-24 MED ORDER — GENTAMICIN IN SALINE 1.6-0.9 MG/ML-% IV SOLN
INTRAVENOUS | Status: AC
  Filled 2011-08-24: qty 50

## 2011-08-24 MED ORDER — METOCLOPRAMIDE HCL 5 MG/ML IJ SOLN
INTRAMUSCULAR | Status: DC | PRN
  Administered 2011-08-24: 10 mg via INTRAVENOUS

## 2011-08-24 MED ORDER — GENTAMICIN SULFATE 40 MG/ML IJ SOLN
300.0000 mg | Freq: Once | INTRAVENOUS | Status: AC
  Administered 2011-08-24: 300 mg via INTRAVENOUS
  Filled 2011-08-24: qty 7.5

## 2011-08-24 MED ORDER — LUBIPROSTONE 8 MCG PO CAPS
8.0000 ug | ORAL_CAPSULE | Freq: Two times a day (BID) | ORAL | Status: DC
  Administered 2011-08-24 – 2011-08-25 (×2): 8 ug via ORAL
  Filled 2011-08-24 (×4): qty 1

## 2011-08-24 MED ORDER — KCL IN DEXTROSE-NACL 10-5-0.45 MEQ/L-%-% IV SOLN
INTRAVENOUS | Status: AC
  Filled 2011-08-24: qty 1000

## 2011-08-24 MED ORDER — MORPHINE SULFATE 2 MG/ML IJ SOLN
2.0000 mg | INTRAMUSCULAR | Status: DC | PRN
  Administered 2011-08-24 – 2011-08-25 (×5): 4 mg via INTRAVENOUS
  Filled 2011-08-24 (×2): qty 1
  Filled 2011-08-24: qty 2
  Filled 2011-08-24 (×2): qty 1

## 2011-08-24 MED ORDER — KCL IN DEXTROSE-NACL 10-5-0.45 MEQ/L-%-% IV SOLN
INTRAVENOUS | Status: DC
  Administered 2011-08-24 – 2011-08-25 (×3): via INTRAVENOUS
  Filled 2011-08-24 (×5): qty 1000

## 2011-08-24 MED ORDER — INDIGOTINDISULFONATE SODIUM 8 MG/ML IJ SOLN
INTRAMUSCULAR | Status: DC | PRN
  Administered 2011-08-24: 5 mL via INTRAVENOUS

## 2011-08-24 MED ORDER — SODIUM CHLORIDE 0.9 % IR SOLN
Status: DC | PRN
  Administered 2011-08-24: 09:00:00

## 2011-08-24 MED ORDER — SUCCINYLCHOLINE CHLORIDE 20 MG/ML IJ SOLN
INTRAMUSCULAR | Status: DC | PRN
  Administered 2011-08-24: 100 mg via INTRAVENOUS

## 2011-08-24 MED ORDER — LORAZEPAM 1 MG PO TABS
1.0000 mg | ORAL_TABLET | Freq: Two times a day (BID) | ORAL | Status: DC | PRN
  Administered 2011-08-24: 1 mg via ORAL
  Filled 2011-08-24: qty 1

## 2011-08-24 MED ORDER — POLYETHYLENE GLYCOL 3350 17 G PO PACK
17.0000 g | PACK | Freq: Every day | ORAL | Status: DC
  Administered 2011-08-24 – 2011-08-25 (×2): 17 g via ORAL
  Filled 2011-08-24 (×2): qty 1

## 2011-08-24 MED ORDER — ROCURONIUM BROMIDE 100 MG/10ML IV SOLN
INTRAVENOUS | Status: DC | PRN
  Administered 2011-08-24 (×3): 5 mg via INTRAVENOUS
  Administered 2011-08-24: 20 mg via INTRAVENOUS

## 2011-08-24 MED ORDER — ESTRADIOL 0.1 MG/GM VA CREA
TOPICAL_CREAM | VAGINAL | Status: DC | PRN
  Administered 2011-08-24: 1 via VAGINAL

## 2011-08-24 MED ORDER — ONDANSETRON HCL 4 MG/2ML IJ SOLN
INTRAMUSCULAR | Status: DC | PRN
  Administered 2011-08-24: 4 mg via INTRAVENOUS

## 2011-08-24 MED ORDER — MIDAZOLAM HCL 5 MG/5ML IJ SOLN
INTRAMUSCULAR | Status: DC | PRN
  Administered 2011-08-24 (×2): 1 mg via INTRAVENOUS

## 2011-08-24 MED ORDER — LACTATED RINGERS IV SOLN
INTRAVENOUS | Status: DC | PRN
  Administered 2011-08-24 (×2): via INTRAVENOUS

## 2011-08-24 MED ORDER — GENTAMICIN IN SALINE 1.6-0.9 MG/ML-% IV SOLN
80.0000 mg | INTRAVENOUS | Status: AC
  Administered 2011-08-24: 80 mg via INTRAVENOUS

## 2011-08-24 MED ORDER — MONTELUKAST SODIUM 10 MG PO TABS
10.0000 mg | ORAL_TABLET | Freq: Every day | ORAL | Status: DC
  Administered 2011-08-24: 10 mg via ORAL
  Filled 2011-08-24 (×2): qty 1

## 2011-08-24 MED ORDER — TAMSULOSIN HCL 0.4 MG PO CAPS
0.4000 mg | ORAL_CAPSULE | Freq: Every day | ORAL | Status: DC
  Administered 2011-08-24 – 2011-08-25 (×2): 0.4 mg via ORAL
  Filled 2011-08-24 (×2): qty 1

## 2011-08-24 MED ORDER — ACETAMINOPHEN 10 MG/ML IV SOLN
1000.0000 mg | Freq: Four times a day (QID) | INTRAVENOUS | Status: AC
  Administered 2011-08-24 – 2011-08-25 (×4): 1000 mg via INTRAVENOUS
  Filled 2011-08-24 (×5): qty 100

## 2011-08-24 MED ORDER — SODIUM CHLORIDE 0.9 % IV SOLN
1.5000 g | INTRAVENOUS | Status: AC
  Administered 2011-08-24: 1.5 g via INTRAVENOUS

## 2011-08-24 MED ORDER — ALBUTEROL SULFATE HFA 108 (90 BASE) MCG/ACT IN AERS: 2.0000 | INHALATION_SPRAY | Freq: Four times a day (QID) | RESPIRATORY_TRACT | Status: DC | PRN

## 2011-08-24 MED ORDER — SODIUM CHLORIDE 0.9 % IV SOLN
INTRAVENOUS | Status: AC
  Filled 2011-08-24: qty 1.5

## 2011-08-24 MED ORDER — NEOSTIGMINE METHYLSULFATE 1 MG/ML IJ SOLN
INTRAMUSCULAR | Status: DC | PRN
  Administered 2011-08-24: 4 mg via INTRAVENOUS

## 2011-08-24 MED ORDER — LIDOCAINE-EPINEPHRINE (PF) 1 %-1:200000 IJ SOLN
INTRAMUSCULAR | Status: DC | PRN
  Administered 2011-08-24: 15 mL

## 2011-08-24 MED ORDER — LIDOCAINE HCL (CARDIAC) 20 MG/ML IV SOLN
INTRAVENOUS | Status: DC | PRN
  Administered 2011-08-24: 60 mg via INTRAVENOUS

## 2011-08-24 MED ORDER — DEXAMETHASONE SODIUM PHOSPHATE 10 MG/ML IJ SOLN
INTRAMUSCULAR | Status: DC | PRN
  Administered 2011-08-24: 5 mg via INTRAVENOUS

## 2011-08-24 MED ORDER — SODIUM CHLORIDE 0.9 % IV SOLN
1.5000 g | Freq: Four times a day (QID) | INTRAVENOUS | Status: AC
  Administered 2011-08-24 – 2011-08-25 (×3): 1.5 g via INTRAVENOUS
  Filled 2011-08-24 (×3): qty 1.5

## 2011-08-24 SURGICAL SUPPLY — 59 items
ADH SKN CLS APL DERMABOND .7 (GAUZE/BANDAGES/DRESSINGS)
BAG URINE DRAINAGE (UROLOGICAL SUPPLIES) ×2 IMPLANT
BAG URO CATCHER STRL LF (DRAPE) ×2 IMPLANT
BLADE HEX COATED 2.75 (ELECTRODE) ×2 IMPLANT
BLADE SURG 15 STRL LF DISP TIS (BLADE) ×2 IMPLANT
BLADE SURG 15 STRL SS (BLADE) ×4
BRIEF STRETCH FOR OB PAD LRG (UNDERPADS AND DIAPERS) ×2 IMPLANT
CATH FOLEY 2WAY SLVR  5CC 14FR (CATHETERS) ×1
CATH FOLEY 2WAY SLVR  5CC 16FR (CATHETERS) ×1
CATH FOLEY 2WAY SLVR 5CC 14FR (CATHETERS) ×1 IMPLANT
CATH FOLEY 2WAY SLVR 5CC 16FR (CATHETERS) ×1 IMPLANT
CLOTH BEACON ORANGE TIMEOUT ST (SAFETY) ×2 IMPLANT
COVER MAYO STAND STRL (DRAPES) ×2 IMPLANT
DECANTER SPIKE VIAL GLASS SM (MISCELLANEOUS) ×2 IMPLANT
DERMABOND ADVANCED (GAUZE/BANDAGES/DRESSINGS)
DERMABOND ADVANCED .7 DNX12 (GAUZE/BANDAGES/DRESSINGS) IMPLANT
DEVICE CAPIO SUTURING (INSTRUMENTS)
DEVICE CAPIO SUTURING OPC (INSTRUMENTS) IMPLANT
DRAIN PENROSE 18X1/4 LTX STRL (WOUND CARE) ×2 IMPLANT
DRAPE CAMERA CLOSED 9X96 (DRAPES) ×2 IMPLANT
DRAPE LG THREE QUARTER DISP (DRAPES) ×2 IMPLANT
GAUZE PACKING 1 X5 YD ST (GAUZE/BANDAGES/DRESSINGS) ×2 IMPLANT
GAUZE PACKING 2X5 YD STERILE (GAUZE/BANDAGES/DRESSINGS) IMPLANT
GAUZE SPONGE 4X4 16PLY XRAY LF (GAUZE/BANDAGES/DRESSINGS) ×4 IMPLANT
GLOVE BIOGEL M STRL SZ7.5 (GLOVE) ×6 IMPLANT
GLOVE BIOGEL PI IND STRL 8 (GLOVE) ×3 IMPLANT
GLOVE BIOGEL PI INDICATOR 8 (GLOVE) ×3
GOWN PREVENTION PLUS XLARGE (GOWN DISPOSABLE) ×4 IMPLANT
GOWN STRL REIN XL XLG (GOWN DISPOSABLE) ×2 IMPLANT
HOLDER FOLEY CATH W/STRAP (MISCELLANEOUS) ×2 IMPLANT
KIT BASIN OR (CUSTOM PROCEDURE TRAY) ×2 IMPLANT
NEEDLE HYPO 22GX1.5 SAFETY (NEEDLE) IMPLANT
NEEDLE INJECT RIGID (NEEDLE) IMPLANT
NEEDLE MAYO .5 CIRCLE (NEEDLE) ×2 IMPLANT
NEEDLE MAYO 6 CRC TAPER PT (NEEDLE) ×2 IMPLANT
PACK CYSTO (CUSTOM PROCEDURE TRAY) ×2 IMPLANT
PENCIL BUTTON HOLSTER BLD 10FT (ELECTRODE) ×2 IMPLANT
PLUG CATH AND CAP STER (CATHETERS) ×2 IMPLANT
RETRACTOR STAY HOOK 5MM (MISCELLANEOUS) ×2 IMPLANT
SHEET LAVH (DRAPES) ×2 IMPLANT
SUT CAPIO ETHIBPND (SUTURE) IMPLANT
SUT CAPIO POLYGLYCOLIC (SUTURE) IMPLANT
SUT ETHIBOND 0 (SUTURE) IMPLANT
SUT SILK 2 0 SH (SUTURE) IMPLANT
SUT VIC AB 0 CT1 27 (SUTURE) ×2
SUT VIC AB 0 CT1 27XBRD ANTBC (SUTURE) ×1 IMPLANT
SUT VIC AB 2-0 CT1 27 (SUTURE) ×4
SUT VIC AB 2-0 CT1 27XBRD (SUTURE) ×2 IMPLANT
SUT VIC AB 2-0 SH 27 (SUTURE) ×10
SUT VIC AB 2-0 SH 27X BRD (SUTURE) ×5 IMPLANT
SUT VIC AB 3-0 SH 27 (SUTURE) ×8
SUT VIC AB 3-0 SH 27XBRD (SUTURE) ×4 IMPLANT
SUT VIC AB 4-0 PS2 27 (SUTURE) ×2 IMPLANT
SUT VICRYL 0 UR6 27IN ABS (SUTURE) ×10 IMPLANT
SYRINGE 10CC LL (SYRINGE) ×2 IMPLANT
TISSUE REPAIR XENFORM 4X7CM (Tissue) ×2 IMPLANT
TUBING CONNECTING 10 (TUBING) ×2 IMPLANT
WATER STERILE IRR 1500ML POUR (IV SOLUTION) ×2 IMPLANT
YANKAUER SUCT BULB TIP 10FT TU (MISCELLANEOUS) ×2 IMPLANT

## 2011-08-24 NOTE — Progress Notes (Signed)
ANTIBIOTIC CONSULT NOTE - INITIAL  Pharmacy Consult for Gentamicin Indication: synergy  Allergies  Allergen Reactions  . Ambien (Zolpidem Tartrate) Other (See Comments)    unknown  . Codeine Itching  . Gabapentin Other (See Comments)    unknown    Patient Measurements: Weight: 73.9 kg (as of 08/20/11) Height: 157 cm (as of 08/20/11) IBW: 49.7 kg Adjusted Body Weight: 59.4 kg  Vital Signs: Temp: 97.8 F (36.6 C) (06/25 1101) BP: 120/74 mmHg (06/25 1101) Pulse Rate: 92  (06/25 1101)  Microbiology: Recent Results (from the past 720 hour(s))  SURGICAL PCR SCREEN     Status: Abnormal   Collection Time   08/20/11  1:40 PM      Component Value Range Status Comment   MRSA, PCR NEGATIVE  NEGATIVE Final    Staphylococcus aureus POSITIVE (*) NEGATIVE Final     Medical History: Past Medical History  Diagnosis Date  . Diverticulosis 07-08-2005    Colonoscopy  . Stricture and stenosis of esophagus 07-08-2005    EGD  . Hiatal hernia 07-08-2005    EGD  . GERD (gastroesophageal reflux disease) 07-08-2005    EGD  . Irritable bowel syndrome   . Vocal cord polyp   . Unspecified hemorrhoids without mention of complication   . Other and unspecified ovarian cyst   . Hemangioma of intra-abdominal structures   . Degeneration of intervertebral disc, site unspecified   . Osteoarthrosis, unspecified whether generalized or localized, lower leg   . Osteopenia   . Unspecified drug dependence, in remission   . Depressive disorder, not elsewhere classified   . Anxiety state, unspecified   . Asthma   . Rectocele   . Difficulty sleeping     Assessment: 55 YOF s/p cystoscopy, rectocele, vaginal vault suspension to receive 24 hours of post-op antibiotics with Unasyn and gentamicin for synergy.  Patient received 80 mg IV gent and 1500 mg Unasyn pre-op this AM at ~0700.  Will use patient's adjusted body weight of 59.4 kg for one-time 5 mg/kg synergistic dose of gentamicin.  SCr 0.74, CrCl~82  ml/min.  Goal of Therapy:  synergy with gentamicin  Plan:  Gentamicin 300 mg IV once today. Unasyn dosing appropriate.  Clance Boll 08/24/2011,11:20 AM

## 2011-08-24 NOTE — Anesthesia Postprocedure Evaluation (Signed)
Anesthesia Post Note  Patient: Teresa [name]  Procedure(s) Performed: Procedure(s) (LRB): CYSTOSCOPY (N/A) POSTERIOR REPAIR (RECTOCELE) (N/A) VAGINAL VAULT SUSPENSION (N/A)  Anesthesia type: General  Patient location: PACU  Post pain: Pain level controlled  Post assessment: Post-op Vital signs reviewed  Last Vitals:  Filed Vitals:   08/24/11 1015  BP: 118/62  Pulse: 91  Temp:   Resp: 13    Post vital signs: Reviewed  Level of consciousness: sedated  Complications: No apparent anesthesia complications

## 2011-08-24 NOTE — Interval H&P Note (Signed)
History and Physical Interval Note:  08/24/2011 7:28 AM  Teresa [name]  has presented today for surgery, with the diagnosis of Rectocele Vault Prolapse  The various methods of treatment have been discussed with the patient and family. After consideration of risks, benefits and other options for treatment, the patient has consented to  Procedure(s) (LRB): CYSTOSCOPY (N/A) POSTERIOR REPAIR (RECTOCELE) (N/A) VAGINAL VAULT SUSPENSION (N/A) as a surgical intervention .  The patient's history has been reviewed, patient examined, no change in status, stable for surgery.  I have reviewed the patients' chart and labs.  Questions were answered to the patient's satisfaction.     Aashvi Rezabek A

## 2011-08-24 NOTE — Anesthesia Preprocedure Evaluation (Addendum)
Anesthesia Evaluation  Patient identified by MRN, date of birth, ID band Patient awake    Reviewed: Allergy & Precautions, H&P , NPO status , Patient's Chart, lab work & pertinent test results  Airway Mallampati: II TM Distance: >3 FB Neck ROM: Full    Dental  (+) Edentulous Upper and Edentulous Lower   Pulmonary asthma ,  breath sounds clear to auscultation  Pulmonary exam normal       Cardiovascular negative cardio ROS  Rhythm:Regular Rate:Normal     Neuro/Psych Anxiety Depression  Neuromuscular disease negative neurological ROS  negative psych ROS   GI/Hepatic Neg liver ROS, hiatal hernia, GERD-  ,(+)     substance abuse  alcohol use,   Endo/Other  negative endocrine ROS  Renal/GU negative Renal ROS  negative genitourinary   Musculoskeletal negative musculoskeletal ROS (+)   Abdominal   Peds negative pediatric ROS (+)  Hematology negative hematology ROS (+)   Anesthesia Other Findings   Reproductive/Obstetrics negative OB ROS                          Anesthesia Physical Anesthesia Plan  ASA: II  Anesthesia Plan: General   Post-op Pain Management:    Induction: Intravenous  Airway Management Planned: Oral ETT  Additional Equipment:   Intra-op Plan:   Post-operative Plan: Extubation in OR  Informed Consent: I have reviewed the patients History and Physical, chart, labs and discussed the procedure including the risks, benefits and alternatives for the proposed anesthesia with the patient or authorized representative who has indicated his/her understanding and acceptance.   Dental advisory given  Plan Discussed with: CRNA  Anesthesia Plan Comments:         Anesthesia Quick Evaluation

## 2011-08-24 NOTE — Op Note (Signed)
Preop diagnosis: Rectocele possible small enterocele vault prolapse Postoperative diagnosis: Rectocele possible small enterocele vault prolapse Surgery: Rectocele repair plus graft plus vault prolapse repair plus cystoscopy Surgeon: Dr. Lorin Picket Jaymon Dudek Assistant: Pecola Leisure  The patient consented to the above procedure with the above diagnoses. Extra care was taken with leg positioning to minimize the risk of compartment syndrome and neuropathy and DVT. She had a narrow pubic arch and narrow vagina and she is a petite lady. Preoperative antibiotics were given.  I instilled 20 cc of a lidocaine epinephrine mixture. Her vaginal cuff was well supported I could identify the cuff and dimples. She a little bit of narrowing of her introitus and a ski jump the fact in the midline at the level of the posterior fourchette. Likely from previous surgery or episiotomy there was a gaping of the distal vagina for approximately 2 cm before it started to bulge with her posterior defect  I removed a small triangle of perineal skin between 2 Allis clamps placed on the hymenal ring. I made a long posterior incision all the way to the cuff. Sharp dissection had to be used especially distally where she had a previous incision or scarring. There was quite a bit of dissection sharp at the level of the apex to make certain I took down the posterior defect apically. I sharply and bluntly dissected to the lateral sidewall bilaterally and gently broke to the pararectal gutters bilaterally identifying the iliococcygeus muscle  Rectal examination identified an obvious apical site defect and I did a trapdoor closure with 3 interrupted Vicryl placed through rectovaginal fascia into the full thickness of the posterior vaginal wall the level apex. Prior to this I placed a 0 Vicryl cephalad into the iliococcygeus muscle distal and more ventral than the initial spine. I did not dissect back to the initial spine.  I repeated the  rectal examination after the trapdoor and she needed a gentle 2 layer imbricating posterior repair and this was done.  I cut a 7 x 4 cm graft in the shape of a trapezoid and leaving some graft apically I sewed it in place and distally with 0 Vicryl superficially to the rectovaginal fascia proximal to the introitus. I was doing the anterior repair I was trying to give her as much length as I could as I imbricated gently. Repeat rectal examination demonstrated no suture in the rectum and nice flattening of the rectum with no distortion of the lumen.  Irrigation was utilized. I trimmed a few millimeters bilaterally of posterior vaginal wall. I closed the posterior vaginal wall with running 2-0 Vicryl and CT1 needle reapproximating the hymen. I used a gentle 0 Vicryl suture in the perineum imbricating minimally. I exteriorize the running Vicryl and closed the perineum subcuticularly.  Although I had checked throughout the case the patient had very good vaginal length. She had no narrowing of the vagina. She once again had a little bit of a ski jump the fact at 6:00 at the level of the posterior fourchette. I did not feel that there was another way I could closed the perineum to change her anatomy. In my opinion she did not need a releasing incision at 5 and our 7:00. I did not want to do a transverse closure of the perineum to shorten the perineum. I could insert 2 fingers easily into the vagina.  Vaginal pack with Estrace cream was inserted. I will discuss the mild narrowing of the introitus with the patient postoperatively. I was very pleased  with the surgery

## 2011-08-24 NOTE — Transfer of Care (Signed)
Immediate Anesthesia Transfer of Care Note  Patient: Teresa [name]  Procedure(s) Performed: Procedure(s) (LRB): CYSTOSCOPY (N/A) POSTERIOR REPAIR (RECTOCELE) (N/A) VAGINAL VAULT SUSPENSION (N/A)  Patient Location: PACU  Anesthesia Type: General  Level of Consciousness: oriented, sedated and patient cooperative  Airway & Oxygen Therapy: Patient Spontanous Breathing and Patient connected to face mask oxygen  Post-op Assessment: Report given to PACU RN, Post -op Vital signs reviewed and stable and Patient moving all extremities  Post vital signs: Reviewed and stable  Complications: No apparent anesthesia complications

## 2011-08-25 ENCOUNTER — Encounter (HOSPITAL_COMMUNITY): Payer: Self-pay | Admitting: Urology

## 2011-08-25 LAB — BASIC METABOLIC PANEL
CO2: 26 mEq/L (ref 19–32)
Calcium: 8.2 mg/dL — ABNORMAL LOW (ref 8.4–10.5)
Creatinine, Ser: 0.67 mg/dL (ref 0.50–1.10)
GFR calc non Af Amer: >90 mL/min (ref >90)
Sodium: 136 mEq/L (ref 135–145)

## 2011-08-25 LAB — HEMOGLOBIN AND HEMATOCRIT, BLOOD: Hemoglobin: 12.1 g/dL (ref 12.0–15.0)

## 2011-08-25 MED ORDER — CIPROFLOXACIN HCL 250 MG PO TABS: 250.0000 mg | ORAL_TABLET | Freq: Two times a day (BID) | ORAL | Status: DC

## 2011-08-25 MED ORDER — OXYCODONE-ACETAMINOPHEN 10-325 MG PO TABS: 1.0000 | ORAL_TABLET | ORAL | Status: DC | PRN

## 2011-08-25 NOTE — Discharge Instructions (Signed)
I have reviewed discharge instructions in detail with the patient. They will follow-up with me or their physician as scheduled. My nurse will also be calling the patients as per protocol.   Please gives names of miralax and colace Keep bowels regular for 6-12 weeks

## 2011-08-25 NOTE — Discharge Summary (Signed)
Date of admission: 08/24/2011  Date of discharge: 08/25/2011  Admission diagnosis:Rectocele  Discharge diagnosis: Rectocele  Secondary diagnoses: Vault prolapse  History and Physical: For full details, please see admission history and physical. Briefly, Teresa [name] is a 56 y.o. year old patient with rectocele. Had surgery and uneventful post op course. Labs OK and minimal pain day of discharge.   Hospital Course: See above.   Laboratory values:  Basename 08/25/11 0452 08/24/11 1620  HGB 12.1 12.4  HCT 36.4 37.4    Basename 08/25/11 0452  CREATININE 0.67    Disposition: Home  Discharge instruction: The patient was instructed to be ambulatory but told to refrain from heavy lifting, strenuous activity, or driving.   Discharge medications:  Medication List  As of 08/25/2011  6:58 AM   START taking these medications         ciprofloxacin 250 MG tablet   Commonly known as: CIPRO   Take 1 tablet (250 mg total) by mouth 2 (two) times daily.      oxyCODONE-acetaminophen 10-325 MG per tablet   Commonly known as: PERCOCET   Take 1-2 tablets by mouth every 4 (four) hours as needed for pain.         CONTINUE taking these medications         cetirizine 10 MG tablet   Commonly known as: ZYRTEC      Cranberry 1000 MG Caps      cyclobenzaprine 10 MG tablet   Commonly known as: FLEXERIL      DEXILANT 60 MG capsule   Generic drug: dexlansoprazole      ESTRACE VA      Fiber Complete Tabs      LORazepam 1 MG tablet   Commonly known as: ATIVAN      lubiprostone 8 MCG capsule   Commonly known as: AMITIZA      montelukast 10 MG tablet   Commonly known as: SINGULAIR      polyethylene glycol packet   Commonly known as: MIRALAX / GLYCOLAX      PROAIR HFA 108 (90 BASE) MCG/ACT inhaler   Generic drug: albuterol      Tamsulosin HCl 0.4 MG Caps   Commonly known as: FLOMAX          Where to get your medications    These are the prescriptions that you need to pick up. We  sent them to a specific pharmacy, so you will need to go there to get them.   [facility] - [place], [place] - [address] [facility]    [address] [place] [address]    Phone: [phone]        ciprofloxacin 250 MG tablet         You may get these medications from any pharmacy.         oxyCODONE-acetaminophen 10-325 MG per tablet            Followup:  Follow-up Information    Follow up with Dennise Bamber A, MD. (as scheduled)    Contact information:   [address] [facility] [place] [address] [phone]

## 2011-08-25 NOTE — Progress Notes (Signed)
Vitals normal Laboratory tests normal Patient alert and stable Pain minimal and well-controlled; no nerve pain Post treatment course discussed in detail Followup discussed in detail See orders

## 2011-08-25 NOTE — Progress Notes (Signed)
Pt voided 400cc of urine. PVR check with bladder scanner equal to 17cc. Pt has no complaints of discomfort or distention. Teresa [name]

## 2011-08-27 ENCOUNTER — Encounter (INDEPENDENT_AMBULATORY_CARE_PROVIDER_SITE_OTHER): Payer: Self-pay | Admitting: Surgery

## 2011-08-31 DIAGNOSIS — N816 Rectocele: Secondary | ICD-10-CM | POA: Diagnosis not present

## 2011-09-01 ENCOUNTER — Encounter (INDEPENDENT_AMBULATORY_CARE_PROVIDER_SITE_OTHER): Payer: Self-pay | Admitting: Surgery

## 2011-09-01 ENCOUNTER — Ambulatory Visit (INDEPENDENT_AMBULATORY_CARE_PROVIDER_SITE_OTHER): Payer: Medicare Other | Admitting: Surgery

## 2011-09-01 VITALS — BP 112/86 | HR 112 | Temp 97.6°F | Ht 62.0 in | Wt 165.2 lb

## 2011-09-01 DIAGNOSIS — D249 Benign neoplasm of unspecified breast: Secondary | ICD-10-CM

## 2011-09-01 DIAGNOSIS — B850 Pediculosis due to Pediculus humanus capitis: Secondary | ICD-10-CM | POA: Diagnosis not present

## 2011-09-01 NOTE — Progress Notes (Signed)
Patient ID: Teresa [name], female   DOB: January 24, 1956, 56 y.o.   MRN: [id number]  Chief Complaint  Patient presents with  . Pre-op Exam    eval Lt br mass    HPI Teresa [name] is a 56 y.o. female.  This is a very pleasant female who is referred by Caren Macadam PA for evaluation of a recent diagnosis of a left breast papilloma. This was found on routine screening mammography. She has had no problems her breast in the past. She denies nipple discharge. She is otherwise without complaints. HPI  Past Medical History  Diagnosis Date  . Diverticulosis 07-08-2005    Colonoscopy  . Stricture and stenosis of esophagus 07-08-2005    EGD  . Hiatal hernia 07-08-2005    EGD  . GERD (gastroesophageal reflux disease) 07-08-2005    EGD  . Irritable bowel syndrome   . Vocal cord polyp   . Unspecified hemorrhoids without mention of complication   . Other and unspecified ovarian cyst   . Hemangioma of intra-abdominal structures   . Degeneration of intervertebral disc, site unspecified   . Osteoarthrosis, unspecified whether generalized or localized, lower leg   . Osteopenia   . Unspecified drug dependence, in remission   . Depressive disorder, not elsewhere classified   . Anxiety state, unspecified   . Asthma   . Rectocele   . Difficulty sleeping     Past Surgical History  Procedure Date  . Cholecystectomy   . Abdominal hysterectomy   . Bladder surgery     Tack   . Rectal tumor by proctotomy excision   . Cystoscopy 08/24/2011    Procedure: CYSTOSCOPY;  Surgeon: Martina Sinner, MD;  Location: [facility];  Service: Urology;  Laterality: N/A;  Cystoscopy,  Rectocele Repair and Vault Prolapse Repair  . Rectocele repair 08/24/2011    Procedure: POSTERIOR REPAIR (RECTOCELE);  Surgeon: Martina Sinner, MD;  Location: [facility];  Service: Urology;  Laterality: N/A;  . Vaginal prolapse repair 08/24/2011    Procedure: VAGINAL VAULT SUSPENSION;  Surgeon: Martina Sinner, MD;  Location: [facility];   Service: Urology;  Laterality: N/A;    Family History  Problem Relation Age of Onset  . Prostate cancer Father   . Stomach cancer Maternal Uncle   . Diabetes Maternal Grandmother   . Heart disease Paternal Grandmother   . Irritable bowel syndrome      Social History History  Substance Use Topics  . Smoking status: Former Smoker    Quit date: 08/20/1991  . Smokeless tobacco: Never Used  . Alcohol Use: No    Allergies  Allergen Reactions  . Ambien (Zolpidem Tartrate) Other (See Comments)    unknown  . Codeine Itching  . Gabapentin Other (See Comments)    unknown    Current Outpatient Prescriptions  Medication Sig Dispense Refill  . albuterol (PROAIR HFA) 108 (90 BASE) MCG/ACT inhaler Inhale 2 puffs into the lungs every 6 (six) hours as needed. Wheezing and shortness of breath      . cetirizine (ZYRTEC) 10 MG tablet Take 10 mg by mouth daily.      . Cranberry 1000 MG CAPS Take 2 capsules by mouth daily.        . cyclobenzaprine (FLEXERIL) 10 MG tablet Take 10 mg by mouth 3 (three) times daily as needed. spasms      . dexlansoprazole (DEXILANT) 60 MG capsule Take 60 mg by mouth daily.       . Estradiol (ESTRACE  VA) Place 1 application vaginally daily. Apply topically once daily      . Fiber Complete TABS Take 1 tablet by mouth daily.        Marland Kitchen LORazepam (ATIVAN) 1 MG tablet Take 1 mg by mouth 2 (two) times daily as needed. anxiety      . lubiprostone (AMITIZA) 8 MCG capsule Take 8 mcg by mouth 2 (two) times daily with a meal.       . montelukast (SINGULAIR) 10 MG tablet Take 10 mg by mouth at bedtime.       Marland Kitchen oxyCODONE-acetaminophen (PERCOCET) 10-325 MG per tablet Take 1-2 tablets by mouth every 4 (four) hours as needed for pain.  40 tablet  0  . polyethylene glycol (MIRALAX / GLYCOLAX) packet Take 17 g by mouth daily.        . Tamsulosin HCl (FLOMAX) 0.4 MG CAPS Take 0.4 mg by mouth daily.        Review of Systems Review of Systems  Constitutional: Negative for fever,  chills and unexpected weight change.  HENT: Negative for hearing loss, congestion, sore throat, trouble swallowing and voice change.   Eyes: Negative for visual disturbance.  Respiratory: Negative for cough and wheezing.   Cardiovascular: Negative for chest pain, palpitations and leg swelling.  Gastrointestinal: Negative for nausea, vomiting, abdominal pain, diarrhea, constipation, blood in stool, abdominal distention and anal bleeding.  Genitourinary: Negative for hematuria, vaginal bleeding and difficulty urinating.  Musculoskeletal: Negative for arthralgias.  Skin: Negative for rash and wound.  Neurological: Negative for seizures, syncope and headaches.  Hematological: Negative for adenopathy. Does not bruise/bleed easily.  Psychiatric/Behavioral: Negative for confusion.    Blood pressure 112/86, pulse 112, temperature 97.6 F (36.4 C), temperature source Temporal, height 5\' 2"  (1.575 m), weight 165 lb 3.2 oz (74.934 kg), SpO2 95.00%.  Physical Exam Physical Exam  Constitutional: She is oriented to person, place, and time. She appears well-developed and well-nourished. No distress.  HENT:  Head: Normocephalic and atraumatic.  Right Ear: External ear normal.  Left Ear: External ear normal.  Nose: Nose normal.  Mouth/Throat: Oropharynx is clear and moist.  Eyes: Conjunctivae are normal. Pupils are equal, round, and reactive to light. Right eye exhibits no discharge. Left eye exhibits no discharge. No scleral icterus.  Neck: Normal range of motion. Neck supple. No tracheal deviation present. No thyromegaly present.  Cardiovascular: Normal rate, regular rhythm, normal heart sounds and intact distal pulses.   No murmur heard. Pulmonary/Chest: Effort normal and breath sounds normal. No respiratory distress. She has no wheezes. She has no rales. She exhibits no tenderness.  Abdominal: Soft. Bowel sounds are normal. She exhibits no distension. There is no tenderness.  Lymphadenopathy:     She has no cervical adenopathy.  Neurological: She is alert and oriented to person, place, and time. No cranial nerve deficit.  Skin: Skin is warm and dry. No rash noted. She is not diaphoretic. No erythema.  Psychiatric: Her behavior is normal. Judgment normal.  breast: There are no palpable masses There is no axillary adenopathy On either side  Data Reviewed I have reviewed her mammograms and biopsy results. This is a 7 mm nodule in the left breast. Pathology shows an intraductal papilloma  Assessment    Left breast intraductal papilloma    Plan    Needle localized excisional biopsy of the mass is recommended for histological evaluation and to rule out malignancy. I discussed this with the patient in detail. I discussed the risk of surgery which  includes but is not limited to bleeding, infection, injury, need for further surgery, et Karie Soda. She understands and wishes to proceed. Surgery will be scheduled. Likelihood of success is good       Jozy Mcphearson A 09/01/2011, 4:52 PM

## 2011-09-03 ENCOUNTER — Other Ambulatory Visit (INDEPENDENT_AMBULATORY_CARE_PROVIDER_SITE_OTHER): Payer: Self-pay | Admitting: Surgery

## 2011-09-03 DIAGNOSIS — D249 Benign neoplasm of unspecified breast: Secondary | ICD-10-CM

## 2011-09-24 DIAGNOSIS — F329 Major depressive disorder, single episode, unspecified: Secondary | ICD-10-CM | POA: Diagnosis not present

## 2011-09-24 DIAGNOSIS — F411 Generalized anxiety disorder: Secondary | ICD-10-CM | POA: Diagnosis not present

## 2011-09-30 DIAGNOSIS — D249 Benign neoplasm of unspecified breast: Secondary | ICD-10-CM

## 2011-09-30 HISTORY — DX: Benign neoplasm of unspecified breast: D24.9

## 2011-10-04 ENCOUNTER — Encounter (HOSPITAL_BASED_OUTPATIENT_CLINIC_OR_DEPARTMENT_OTHER): Payer: Self-pay | Admitting: *Deleted

## 2011-10-04 NOTE — Progress Notes (Signed)
May have to r/s if nobody to bring her-she will call [facility]cs

## 2011-10-05 DIAGNOSIS — Z79899 Other long term (current) drug therapy: Secondary | ICD-10-CM | POA: Diagnosis not present

## 2011-10-05 DIAGNOSIS — G479 Sleep disorder, unspecified: Secondary | ICD-10-CM | POA: Diagnosis not present

## 2011-10-05 DIAGNOSIS — M545 Low back pain: Secondary | ICD-10-CM | POA: Diagnosis not present

## 2011-10-06 ENCOUNTER — Other Ambulatory Visit (INDEPENDENT_AMBULATORY_CARE_PROVIDER_SITE_OTHER): Payer: Self-pay | Admitting: Surgery

## 2011-10-06 DIAGNOSIS — D249 Benign neoplasm of unspecified breast: Secondary | ICD-10-CM

## 2011-10-07 ENCOUNTER — Ambulatory Visit (HOSPITAL_BASED_OUTPATIENT_CLINIC_OR_DEPARTMENT_OTHER): Admission: RE | Admit: 2011-10-07 | Payer: Medicare Other | Source: Ambulatory Visit | Admitting: Surgery

## 2011-10-07 ENCOUNTER — Encounter (HOSPITAL_BASED_OUTPATIENT_CLINIC_OR_DEPARTMENT_OTHER): Admission: RE | Payer: Self-pay | Source: Ambulatory Visit

## 2011-10-07 HISTORY — DX: Presence of dental prosthetic device (complete) (partial): Z97.2

## 2011-10-07 HISTORY — DX: Complete loss of teeth, unspecified cause, unspecified class: K08.109

## 2011-10-07 SURGERY — BREAST BIOPSY WITH NEEDLE LOCALIZATION
Anesthesia: General | Laterality: Left

## 2011-10-08 ENCOUNTER — Encounter (INDEPENDENT_AMBULATORY_CARE_PROVIDER_SITE_OTHER): Payer: Self-pay

## 2011-10-15 DIAGNOSIS — F329 Major depressive disorder, single episode, unspecified: Secondary | ICD-10-CM | POA: Diagnosis not present

## 2011-10-15 DIAGNOSIS — F411 Generalized anxiety disorder: Secondary | ICD-10-CM | POA: Diagnosis not present

## 2011-10-19 ENCOUNTER — Encounter (INDEPENDENT_AMBULATORY_CARE_PROVIDER_SITE_OTHER): Payer: Medicare Other | Admitting: Surgery

## 2011-10-19 DIAGNOSIS — H01009 Unspecified blepharitis unspecified eye, unspecified eyelid: Secondary | ICD-10-CM | POA: Diagnosis not present

## 2011-10-27 DIAGNOSIS — K589 Irritable bowel syndrome without diarrhea: Secondary | ICD-10-CM | POA: Diagnosis not present

## 2011-10-27 DIAGNOSIS — F411 Generalized anxiety disorder: Secondary | ICD-10-CM | POA: Diagnosis not present

## 2011-10-27 DIAGNOSIS — R3919 Other difficulties with micturition: Secondary | ICD-10-CM | POA: Diagnosis not present

## 2011-10-28 ENCOUNTER — Encounter (HOSPITAL_BASED_OUTPATIENT_CLINIC_OR_DEPARTMENT_OTHER): Payer: Self-pay | Admitting: *Deleted

## 2011-11-02 NOTE — H&P (Signed)
Patient ID: Teresa [name], female DOB: [birth year], 56 y.o. MRN: [id number]  Chief Complaint   Patient presents with   .  Pre-op Exam     eval Lt br mass    HPI  Teresa [name] is a 56 y.o. female. This is a very pleasant female who is referred by Caren Macadam PA for evaluation of a recent diagnosis of a left breast papilloma. This was found on routine screening mammography. She has had no problems her breast in the past. She denies nipple discharge. She is otherwise without complaints.  HPI  Past Medical History   Diagnosis  Date   .  Diverticulosis  07-08-2005     Colonoscopy   .  Stricture and stenosis of esophagus  07-08-2005     EGD   .  Hiatal hernia  07-08-2005     EGD   .  GERD (gastroesophageal reflux disease)  07-08-2005     EGD   .  Irritable bowel syndrome    .  Vocal cord polyp    .  Unspecified hemorrhoids without mention of complication    .  Other and unspecified ovarian cyst    .  Hemangioma of intra-abdominal structures    .  Degeneration of intervertebral disc, site unspecified    .  Osteoarthrosis, unspecified whether generalized or localized, lower leg    .  Osteopenia    .  Unspecified drug dependence, in remission    .  Depressive disorder, not elsewhere classified    .  Anxiety state, unspecified    .  Asthma    .  Rectocele    .  Difficulty sleeping     Past Surgical History   Procedure  Date   .  Cholecystectomy    .  Abdominal hysterectomy    .  Bladder surgery      Tack   .  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  Cystoscopy  08/24/2011     Procedure: CYSTOSCOPY; Surgeon: Martina Sinner, MD; Location: [facility]; Service: Urology; Laterality: N/A; Cystoscopy, Rectocele Repair and Vault Prolapse Repair   .  Rectocele repair  08/24/2011     Procedure: POSTERIOR REPAIR (RECTOCELE); Surgeon: Martina Sinner, MD; Location: [facility]; Service: Urology; Laterality: N/A;   .  Vaginal prolapse repair  08/24/2011     Procedure: VAGINAL VAULT SUSPENSION;  Surgeon: Martina Sinner, MD; Location: [facility]; Service: Urology; Laterality: N/A;    Family History   Problem  Relation  Age of Onset   .  Prostate cancer  Father    .  Stomach cancer  Maternal Uncle    .  Diabetes  Maternal Grandmother    .  Heart disease  Paternal Grandmother    .  Irritable bowel syndrome      Social History  History   Substance Use Topics   .  Smoking status:  Former Smoker     Quit date:  08/20/1991   .  Smokeless tobacco:  Never Used   .  Alcohol Use:  No    Allergies   Allergen  Reactions   .  Ambien (Zolpidem Tartrate)  Other (See Comments)     unknown   .  Codeine  Itching   .  Gabapentin  Other (See Comments)     unknown    Current Outpatient Prescriptions   Medication  Sig  Dispense  Refill   .  albuterol (PROAIR HFA) 108 (90  BASE) MCG/ACT inhaler  Inhale 2 puffs into the lungs every 6 (six) hours as needed. Wheezing and shortness of breath     .  cetirizine (ZYRTEC) 10 MG tablet  Take 10 mg by mouth daily.     .  Cranberry 1000 MG CAPS  Take 2 capsules by mouth daily.     .  cyclobenzaprine (FLEXERIL) 10 MG tablet  Take 10 mg by mouth 3 (three) times daily as needed. spasms     .  dexlansoprazole (DEXILANT) 60 MG capsule  Take 60 mg by mouth daily.     .  Estradiol (ESTRACE VA)  Place 1 application vaginally daily. Apply topically once daily     .  Fiber Complete TABS  Take 1 tablet by mouth daily.     Marland Kitchen  LORazepam (ATIVAN) 1 MG tablet  Take 1 mg by mouth 2 (two) times daily as needed. anxiety     .  lubiprostone (AMITIZA) 8 MCG capsule  Take 8 mcg by mouth 2 (two) times daily with a meal.     .  montelukast (SINGULAIR) 10 MG tablet  Take 10 mg by mouth at bedtime.     Marland Kitchen  oxyCODONE-acetaminophen (PERCOCET) 10-325 MG per tablet  Take 1-2 tablets by mouth every 4 (four) hours as needed for pain.  40 tablet  0   .  polyethylene glycol (MIRALAX / GLYCOLAX) packet  Take 17 g by mouth daily.     .  Tamsulosin HCl (FLOMAX) 0.4 MG CAPS  Take 0.4 mg by  mouth daily.      Review of Systems  Review of Systems  Constitutional: Negative for fever, chills and unexpected weight change.  HENT: Negative for hearing loss, congestion, sore throat, trouble swallowing and voice change.  Eyes: Negative for visual disturbance.  Respiratory: Negative for cough and wheezing.  Cardiovascular: Negative for chest pain, palpitations and leg swelling.  Gastrointestinal: Negative for nausea, vomiting, abdominal pain, diarrhea, constipation, blood in stool, abdominal distention and anal bleeding.  Genitourinary: Negative for hematuria, vaginal bleeding and difficulty urinating.  Musculoskeletal: Negative for arthralgias.  Skin: Negative for rash and wound.  Neurological: Negative for seizures, syncope and headaches.  Hematological: Negative for adenopathy. Does not bruise/bleed easily.  Psychiatric/Behavioral: Negative for confusion.   Blood pressure 112/86, pulse 112, temperature 97.6 F (36.4 C), temperature source Temporal, height 5\' 2"  (1.575 m), weight 165 lb 3.2 oz (74.934 kg), SpO2 95.00%.  Physical Exam  Physical Exam  Constitutional: She is oriented to person, place, and time. She appears well-developed and well-nourished. No distress.  HENT:  Head: Normocephalic and atraumatic.  Right Ear: External ear normal.  Left Ear: External ear normal.  Nose: Nose normal.  Mouth/Throat: Oropharynx is clear and moist.  Eyes: Conjunctivae are normal. Pupils are equal, round, and reactive to light. Right eye exhibits no discharge. Left eye exhibits no discharge. No scleral icterus.  Neck: Normal range of motion. Neck supple. No tracheal deviation present. No thyromegaly present.  Cardiovascular: Normal rate, regular rhythm, normal heart sounds and intact distal pulses.  No murmur heard.  Pulmonary/Chest: Effort normal and breath sounds normal. No respiratory distress. She has no wheezes. She has no rales. She exhibits no tenderness.  Abdominal: Soft. Bowel  sounds are normal. She exhibits no distension. There is no tenderness.  Lymphadenopathy:  She has no cervical adenopathy.  Neurological: She is alert and oriented to person, place, and time. No cranial nerve deficit.  Skin: Skin is warm and dry. No  rash noted. She is not diaphoretic. No erythema.  Psychiatric: Her behavior is normal. Judgment normal.  breast: There are no palpable masses There is no axillary adenopathy On either side  Data Reviewed  I have reviewed her mammograms and biopsy results. This is a 7 mm nodule in the left breast. Pathology shows an intraductal papilloma  Assessment   Left breast intraductal papilloma   Plan   Needle localized excisional biopsy of the mass is recommended for histological evaluation and to rule out malignancy. I discussed this with the patient in detail. I discussed the risk of surgery which includes but is not limited to bleeding, infection, injury, need for further surgery, et Karie Soda. She understands and wishes to proceed. Surgery will be scheduled. Likelihood of success is good   Brodan Grewell A

## 2011-11-03 ENCOUNTER — Encounter (HOSPITAL_BASED_OUTPATIENT_CLINIC_OR_DEPARTMENT_OTHER): Payer: Self-pay | Admitting: Certified Registered Nurse Anesthetist

## 2011-11-03 ENCOUNTER — Encounter (HOSPITAL_BASED_OUTPATIENT_CLINIC_OR_DEPARTMENT_OTHER)
Admission: RE | Disposition: A | Discharge status: Home / Self Care | Payer: Self-pay | Source: Ambulatory Visit | Attending: Surgery

## 2011-11-03 ENCOUNTER — Ambulatory Visit
Admission: RE | Admit: 2011-11-03 | Discharge: 2011-11-03 | Disposition: A | Discharge status: Home / Self Care | Payer: Medicare Other | Source: Ambulatory Visit | Attending: Surgery | Admitting: Surgery

## 2011-11-03 ENCOUNTER — Encounter (HOSPITAL_BASED_OUTPATIENT_CLINIC_OR_DEPARTMENT_OTHER): Payer: Self-pay | Admitting: *Deleted

## 2011-11-03 ENCOUNTER — Ambulatory Visit (HOSPITAL_BASED_OUTPATIENT_CLINIC_OR_DEPARTMENT_OTHER): Payer: Medicare Other | Admitting: Certified Registered Nurse Anesthetist

## 2011-11-03 ENCOUNTER — Ambulatory Visit (HOSPITAL_BASED_OUTPATIENT_CLINIC_OR_DEPARTMENT_OTHER)
Admission: RE | Admit: 2011-11-03 | Discharge: 2011-11-03 | Disposition: A | Discharge status: Home / Self Care | Payer: Medicare Other | Source: Ambulatory Visit | Attending: Surgery | Admitting: Surgery

## 2011-11-03 DIAGNOSIS — J45909 Unspecified asthma, uncomplicated: Secondary | ICD-10-CM | POA: Insufficient documentation

## 2011-11-03 DIAGNOSIS — F329 Major depressive disorder, single episode, unspecified: Secondary | ICD-10-CM | POA: Diagnosis not present

## 2011-11-03 DIAGNOSIS — F411 Generalized anxiety disorder: Secondary | ICD-10-CM | POA: Diagnosis not present

## 2011-11-03 DIAGNOSIS — D249 Benign neoplasm of unspecified breast: Secondary | ICD-10-CM

## 2011-11-03 DIAGNOSIS — N6089 Other benign mammary dysplasias of unspecified breast: Secondary | ICD-10-CM | POA: Diagnosis not present

## 2011-11-03 DIAGNOSIS — K219 Gastro-esophageal reflux disease without esophagitis: Secondary | ICD-10-CM | POA: Diagnosis not present

## 2011-11-03 DIAGNOSIS — F3289 Other specified depressive episodes: Secondary | ICD-10-CM | POA: Insufficient documentation

## 2011-11-03 DIAGNOSIS — N6019 Diffuse cystic mastopathy of unspecified breast: Secondary | ICD-10-CM | POA: Diagnosis not present

## 2011-11-03 HISTORY — DX: Unspecified convulsions: R56.9

## 2011-11-03 HISTORY — DX: Benign neoplasm of unspecified breast: D24.9

## 2011-11-03 HISTORY — DX: Dorsalgia, unspecified: M54.9

## 2011-11-03 HISTORY — DX: Other seasonal allergic rhinitis: J30.2

## 2011-11-03 HISTORY — DX: Other chronic pain: G89.29

## 2011-11-03 HISTORY — PX: BREAST BIOPSY: SHX20

## 2011-11-03 SURGERY — BREAST BIOPSY WITH NEEDLE LOCALIZATION
Anesthesia: General | Site: Breast | Laterality: Left | Wound class: Clean

## 2011-11-03 MED ORDER — ONDANSETRON HCL 4 MG/2ML IJ SOLN
INTRAMUSCULAR | Status: DC | PRN
  Administered 2011-11-03: 4 mg via INTRAVENOUS

## 2011-11-03 MED ORDER — FENTANYL CITRATE 0.05 MG/ML IJ SOLN
INTRAMUSCULAR | Status: DC | PRN
  Administered 2011-11-03 (×2): 50 ug via INTRAVENOUS

## 2011-11-03 MED ORDER — OXYCODONE-ACETAMINOPHEN 5-325 MG PO TABS: ORAL_TABLET | ORAL | Status: AC

## 2011-11-03 MED ORDER — KETOROLAC TROMETHAMINE 30 MG/ML IJ SOLN
INTRAMUSCULAR | Status: DC | PRN
  Administered 2011-11-03: 30 mg via INTRAVENOUS

## 2011-11-03 MED ORDER — LACTATED RINGERS IV SOLN
INTRAVENOUS | Status: DC
  Administered 2011-11-03 (×2): via INTRAVENOUS

## 2011-11-03 MED ORDER — LIDOCAINE HCL (CARDIAC) 20 MG/ML IV SOLN
INTRAVENOUS | Status: DC | PRN
  Administered 2011-11-03: 60 mg via INTRAVENOUS

## 2011-11-03 MED ORDER — MEPERIDINE HCL 25 MG/ML IJ SOLN: 6.2500 mg | INTRAMUSCULAR | Status: DC | PRN

## 2011-11-03 MED ORDER — ACETAMINOPHEN 650 MG RE SUPP: 650.0000 mg | RECTAL | Status: DC | PRN

## 2011-11-03 MED ORDER — HYDROMORPHONE HCL PF 1 MG/ML IJ SOLN
0.2500 mg | INTRAMUSCULAR | Status: DC | PRN
  Administered 2011-11-03 (×2): 0.25 mg via INTRAVENOUS

## 2011-11-03 MED ORDER — ACETAMINOPHEN 325 MG PO TABS: 650.0000 mg | ORAL_TABLET | ORAL | Status: DC | PRN

## 2011-11-03 MED ORDER — OXYCODONE HCL 5 MG PO TABS: 5.0000 mg | ORAL_TABLET | ORAL | Status: DC | PRN

## 2011-11-03 MED ORDER — MORPHINE SULFATE 2 MG/ML IJ SOLN: 2.0000 mg | INTRAMUSCULAR | Status: DC | PRN

## 2011-11-03 MED ORDER — BUPIVACAINE-EPINEPHRINE 0.5% -1:200000 IJ SOLN
INTRAMUSCULAR | Status: DC | PRN
  Administered 2011-11-03: 15 mL

## 2011-11-03 MED ORDER — DEXAMETHASONE SODIUM PHOSPHATE 4 MG/ML IJ SOLN
INTRAMUSCULAR | Status: DC | PRN
  Administered 2011-11-03: 10 mg via INTRAVENOUS

## 2011-11-03 MED ORDER — SODIUM CHLORIDE 0.9 % IJ SOLN: 3.0000 mL | Freq: Two times a day (BID) | INTRAMUSCULAR | Status: DC

## 2011-11-03 MED ORDER — PROPOFOL 10 MG/ML IV BOLUS
INTRAVENOUS | Status: DC | PRN
  Administered 2011-11-03: 200 mg via INTRAVENOUS

## 2011-11-03 MED ORDER — ONDANSETRON HCL 4 MG/2ML IJ SOLN: 4.0000 mg | Freq: Four times a day (QID) | INTRAMUSCULAR | Status: DC | PRN

## 2011-11-03 MED ORDER — PROMETHAZINE HCL 25 MG/ML IJ SOLN: 6.2500 mg | INTRAMUSCULAR | Status: DC | PRN

## 2011-11-03 MED ORDER — MIDAZOLAM HCL 5 MG/5ML IJ SOLN
INTRAMUSCULAR | Status: DC | PRN
  Administered 2011-11-03: 1 mg via INTRAVENOUS

## 2011-11-03 MED ORDER — SODIUM CHLORIDE 0.9 % IJ SOLN: 3.0000 mL | INTRAMUSCULAR | Status: DC | PRN

## 2011-11-03 MED ORDER — CEFAZOLIN SODIUM-DEXTROSE 2-3 GM-% IV SOLR
2.0000 g | INTRAVENOUS | Status: AC
  Administered 2011-11-03: 2 g via INTRAVENOUS

## 2011-11-03 MED ORDER — SODIUM CHLORIDE 0.9 % IV SOLN: 250.0000 mL | INTRAVENOUS | Status: DC | PRN

## 2011-11-03 SURGICAL SUPPLY — 43 items
APL SKNCLS STERI-STRIP NONHPOA (GAUZE/BANDAGES/DRESSINGS) ×1
BENZOIN TINCTURE PRP APPL 2/3 (GAUZE/BANDAGES/DRESSINGS) ×2 IMPLANT
BLADE HEX COATED 2.75 (ELECTRODE) ×2 IMPLANT
BLADE SURG 15 STRL LF DISP TIS (BLADE) ×1 IMPLANT
BLADE SURG 15 STRL SS (BLADE) ×2
CANISTER SUCTION 1200CC (MISCELLANEOUS) IMPLANT
CHLORAPREP W/TINT 26ML (MISCELLANEOUS) ×2 IMPLANT
CLIP TI WIDE RED SMALL 6 (CLIP) IMPLANT
CLOTH BEACON ORANGE TIMEOUT ST (SAFETY) ×2 IMPLANT
COVER MAYO STAND STRL (DRAPES) ×2 IMPLANT
COVER TABLE BACK 60X90 (DRAPES) ×2 IMPLANT
DECANTER SPIKE VIAL GLASS SM (MISCELLANEOUS) IMPLANT
DEVICE DUBIN W/COMP PLATE 8390 (MISCELLANEOUS) ×2 IMPLANT
DRAPE PED LAPAROTOMY (DRAPES) ×2 IMPLANT
DRAPE UTILITY XL STRL (DRAPES) ×2 IMPLANT
DRSG TEGADERM 4X4.75 (GAUZE/BANDAGES/DRESSINGS) ×2 IMPLANT
ELECT REM PT RETURN 9FT ADLT (ELECTROSURGICAL) ×2
ELECTRODE REM PT RTRN 9FT ADLT (ELECTROSURGICAL) ×1 IMPLANT
GAUZE SPONGE 4X4 12PLY STRL LF (GAUZE/BANDAGES/DRESSINGS) ×2 IMPLANT
GLOVE BIOGEL M 7.0 STRL (GLOVE) ×2 IMPLANT
GLOVE BIOGEL PI IND STRL 7.5 (GLOVE) ×1 IMPLANT
GLOVE BIOGEL PI INDICATOR 7.5 (GLOVE) ×1
GLOVE SURG SIGNA 7.5 PF LTX (GLOVE) ×2 IMPLANT
GOWN PREVENTION PLUS XLARGE (GOWN DISPOSABLE) ×2 IMPLANT
GOWN PREVENTION PLUS XXLARGE (GOWN DISPOSABLE) ×2 IMPLANT
KIT MARKER MARGIN INK (KITS) IMPLANT
NEEDLE HYPO 25X1 1.5 SAFETY (NEEDLE) ×2 IMPLANT
NS IRRIG 1000ML POUR BTL (IV SOLUTION) ×2 IMPLANT
PACK BASIN DAY SURGERY FS (CUSTOM PROCEDURE TRAY) ×2 IMPLANT
PENCIL BUTTON HOLSTER BLD 10FT (ELECTRODE) ×2 IMPLANT
SLEEVE SCD COMPRESS KNEE MED (MISCELLANEOUS) ×2 IMPLANT
SPONGE LAP 4X18 X RAY DECT (DISPOSABLE) ×2 IMPLANT
STRIP CLOSURE SKIN 1/2X4 (GAUZE/BANDAGES/DRESSINGS) ×2 IMPLANT
SUT MNCRL AB 4-0 PS2 18 (SUTURE) ×2 IMPLANT
SUT SILK 2 0 SH (SUTURE) ×2 IMPLANT
SUT VIC AB 3-0 SH 27 (SUTURE) ×2
SUT VIC AB 3-0 SH 27X BRD (SUTURE) ×1 IMPLANT
SYR CONTROL 10ML LL (SYRINGE) ×2 IMPLANT
TOWEL OR 17X24 6PK STRL BLUE (TOWEL DISPOSABLE) ×2 IMPLANT
TOWEL OR NON WOVEN STRL DISP B (DISPOSABLE) ×2 IMPLANT
TUBE CONNECTING 20X1/4 (TUBING) IMPLANT
WATER STERILE IRR 1000ML POUR (IV SOLUTION) IMPLANT
YANKAUER SUCT BULB TIP NO VENT (SUCTIONS) IMPLANT

## 2011-11-03 NOTE — Anesthesia Procedure Notes (Signed)
Procedure Name: LMA Insertion Date/Time: 11/03/2011 9:58 AM Performed by: Nolton Denis D Pre-anesthesia Checklist: Patient identified, Emergency Drugs available, Suction available and Patient being monitored Patient Re-evaluated:Patient Re-evaluated prior to inductionOxygen Delivery Method: Circle System Utilized Preoxygenation: Pre-oxygenation with 100% oxygen Intubation Type: IV induction Ventilation: Mask ventilation without difficulty LMA: LMA inserted LMA Size: 4.0 Number of attempts: 1 Airway Equipment and Method: bite block Placement Confirmation: positive ETCO2 Tube secured with: Tape Dental Injury: Teeth and Oropharynx as per pre-operative assessment

## 2011-11-03 NOTE — Anesthesia Preprocedure Evaluation (Signed)
Anesthesia Evaluation  Patient identified by MRN, date of birth, ID band Patient awake    Reviewed: Allergy & Precautions, H&P , NPO status , Patient's Chart, lab work & pertinent test results  History of Anesthesia Complications Negative for: history of anesthetic complications  Airway Mallampati: II  Neck ROM: Full    Dental  (+) Edentulous Upper and Edentulous Lower   Pulmonary asthma ,  breath sounds clear to auscultation        Cardiovascular negative cardio ROS  Rhythm:Regular Rate:Normal     Neuro/Psych Seizures -,  PSYCHIATRIC DISORDERS Anxiety Depression  Neuromuscular disease    GI/Hepatic Neg liver ROS, hiatal hernia, GERD-  ,  Endo/Other    Renal/GU      Musculoskeletal   Abdominal   Peds  Hematology   Anesthesia Other Findings   Reproductive/Obstetrics                           Anesthesia Physical Anesthesia Plan  ASA: II  Anesthesia Plan: General   Post-op Pain Management:    Induction: Intravenous  Airway Management Planned: LMA  Additional Equipment:   Intra-op Plan:   Post-operative Plan: Extubation in OR  Informed Consent: I have reviewed the patients History and Physical, chart, labs and discussed the procedure including the risks, benefits and alternatives for the proposed anesthesia with the patient or authorized representative who has indicated his/her understanding and acceptance.     Plan Discussed with: CRNA and Surgeon  Anesthesia Plan Comments:         Anesthesia Quick Evaluation

## 2011-11-03 NOTE — Transfer of Care (Signed)
Immediate Anesthesia Transfer of Care Note  Patient: Teresa [name]  Procedure(s) Performed: Procedure(s) (LRB) with comments: BREAST BIOPSY WITH NEEDLE LOCALIZATION (Left) - needle localized left breast biopsy  Patient Location: PACU  Anesthesia Type: General  Level of Consciousness: awake, alert , oriented and patient cooperative  Airway & Oxygen Therapy: Patient Spontanous Breathing and Patient connected to face mask oxygen  Post-op Assessment: Report given to PACU RN and Post -op Vital signs reviewed and stable  Post vital signs: Reviewed and stable  Complications: No apparent anesthesia complications

## 2011-11-03 NOTE — Anesthesia Postprocedure Evaluation (Signed)
  Anesthesia Post-op Note  Patient: Teresa [name]  Procedure(s) Performed: Procedure(s) (LRB) with comments: BREAST BIOPSY WITH NEEDLE LOCALIZATION (Left) - needle localized left breast biopsy  Patient Location: PACU  Anesthesia Type: General  Level of Consciousness: awake  Airway and Oxygen Therapy: Patient Spontanous Breathing  Post-op Pain: mild  Post-op Assessment: Post-op Vital signs reviewed  Post-op Vital Signs: stable  Complications: No apparent anesthesia complications

## 2011-11-03 NOTE — Interval H&P Note (Signed)
History and Physical Interval Note:  No change in H and P  11/03/2011 9:17 AM  Teresa [name]  has presented today for surgery, with the diagnosis of left breast papilloma  The various methods of treatment have been discussed with the patient and family. After consideration of risks, benefits and other options for treatment, the patient has consented to  Procedure(s) (LRB) with comments: BREAST BIOPSY WITH NEEDLE LOCALIZATION (Left) - needle localized left breast biopsy as a surgical intervention .  The patient's history has been reviewed, patient examined, no change in status, stable for surgery.  I have reviewed the patient's chart and labs.  Questions were answered to the patient's satisfaction.     Shantia Sanford A

## 2011-11-03 NOTE — Op Note (Signed)
BREAST BIOPSY WITH NEEDLE LOCALIZATION  Procedure Note  [name] 11/03/2011   Pre-op Diagnosis: left breast papilloma     Post-op Diagnosis: same  Procedure(s): NEEDLE LOCALIZED LEFT BREAST LUMPECTOMY  Surgeon(s): Shelly Rubenstein, MD  Anesthesia: General  Staff:  Nolon Nations Ward, RN - Circulator Joylene Grapes, RN - Relief Circulator Vernie Ammons Bouchillon, RN - Scrub Person  Estimated Blood Loss: Minimal               Specimens: left breast lumpectomy         Indications: This is [name] 56 year old female presents with [name] left breast mass. Stereotactic biopsy to further left breast papilloma. The decision was made to proceed with left breast needle localized lumpectomy  Procedure: The patient already had [name] localization wire placed in the left breast at the breast center. She was taken to the operating room and identified as the correct patient. She was placed in the supine position on the operating table and general anesthesia was induced. Her left breast was prepped and draped in the usual sterile fashion. I anesthetize the skin around the localization wire with half percent Marcaine. It extended to the breast tissue with electrocautery. I then performed [name] wide lumpectomy going down to the chest wall around the localization wire. Adequate wide margins appeared to be achieved. The specimen was completely removed, x-ray confirmed that the suspicious area was in the specimen with wide margins. This was confirmed by the radiologist. The specimen was sent to pathology for evaluation. Hemostasis achieved with cautery. [name] close subcutaneous tissue with interrupted 3 Vicryl sutures and closed the skin with running 4-0 Monocryl. Steri-Strips, gauze, and Tegaderm applied. The patient tolerated the procedure well. All the counts were correct at the end of the procedure. The patient was then extubated in operating room and taken in stable condition to the recovery room. Teresa [name]   Date:  11/03/2011  Time: 10:26 AM

## 2011-11-04 ENCOUNTER — Encounter (HOSPITAL_BASED_OUTPATIENT_CLINIC_OR_DEPARTMENT_OTHER): Payer: Self-pay | Admitting: Surgery

## 2011-11-04 LAB — POCT HEMOGLOBIN-HEMACUE: Hemoglobin: 6.5 g/dL — CL (ref 12.0–15.0)

## 2011-11-08 ENCOUNTER — Telehealth (INDEPENDENT_AMBULATORY_CARE_PROVIDER_SITE_OTHER): Payer: Self-pay | Admitting: General Surgery

## 2011-11-08 NOTE — Telephone Encounter (Signed)
Message copied by Wilder Glade on Mon Nov 08, 2011  2:59 PM ------      Message from: Erin, [place]      Created: Mon Nov 08, 2011  2:45 PM      Regarding: Dr Magnus Ivan      Contact: [phone]       Pt need to know her Bx results. Please call.            Thanks

## 2011-11-08 NOTE — Telephone Encounter (Signed)
Called pt and wanted to know her path results and I gave it over the phone and told her to keep her appt with Dr Magnus Ivan on 11-22-11

## 2011-11-12 DIAGNOSIS — Z23 Encounter for immunization: Secondary | ICD-10-CM | POA: Diagnosis not present

## 2011-11-17 DIAGNOSIS — K589 Irritable bowel syndrome without diarrhea: Secondary | ICD-10-CM | POA: Diagnosis not present

## 2011-11-17 DIAGNOSIS — F329 Major depressive disorder, single episode, unspecified: Secondary | ICD-10-CM | POA: Diagnosis not present

## 2011-11-22 ENCOUNTER — Encounter (INDEPENDENT_AMBULATORY_CARE_PROVIDER_SITE_OTHER): Payer: Medicare Other | Admitting: Surgery

## 2011-11-22 ENCOUNTER — Ambulatory Visit (INDEPENDENT_AMBULATORY_CARE_PROVIDER_SITE_OTHER): Payer: Medicare Other | Admitting: Surgery

## 2011-11-22 ENCOUNTER — Encounter (INDEPENDENT_AMBULATORY_CARE_PROVIDER_SITE_OTHER): Payer: Self-pay | Admitting: Surgery

## 2011-11-22 VITALS — BP 130/88 | HR 104 | Temp 98.2°F | Ht 62.0 in | Wt 163.0 lb

## 2011-11-22 DIAGNOSIS — Z09 Encounter for follow-up examination after completed treatment for conditions other than malignant neoplasm: Secondary | ICD-10-CM

## 2011-11-22 NOTE — Progress Notes (Signed)
Subjective:     Patient ID: Teresa [name], female   DOB: [birth year], 56 y.o.   MRN: [id number]  HPI  She is here for her first postoperative visit status post needle localized left breast lumpectomy for papilloma She has no complaints. Review of Systems     Objective:   Physical Exam    On exam, her incision is well healed with no evidence of infection. The final pathology confirmed the papilloma and fibrocystic changes with no evidence of malignancy Assessment:     Patient stable postop    Plan:     I again explained the pathology to her. She will continue her yearly mammograms. She will continue self-examinations. I will see her back as needed

## 2011-11-25 DIAGNOSIS — H01009 Unspecified blepharitis unspecified eye, unspecified eyelid: Secondary | ICD-10-CM | POA: Diagnosis not present

## 2011-12-13 DIAGNOSIS — G479 Sleep disorder, unspecified: Secondary | ICD-10-CM | POA: Diagnosis not present

## 2011-12-13 DIAGNOSIS — M545 Low back pain: Secondary | ICD-10-CM | POA: Diagnosis not present

## 2011-12-13 DIAGNOSIS — M25569 Pain in unspecified knee: Secondary | ICD-10-CM | POA: Diagnosis not present

## 2011-12-13 DIAGNOSIS — Z79899 Other long term (current) drug therapy: Secondary | ICD-10-CM | POA: Diagnosis not present

## 2011-12-15 DIAGNOSIS — D510 Vitamin B12 deficiency anemia due to intrinsic factor deficiency: Secondary | ICD-10-CM | POA: Diagnosis not present

## 2011-12-15 DIAGNOSIS — R5381 Other malaise: Secondary | ICD-10-CM | POA: Diagnosis not present

## 2011-12-27 DIAGNOSIS — M545 Low back pain: Secondary | ICD-10-CM | POA: Diagnosis not present

## 2011-12-27 DIAGNOSIS — J069 Acute upper respiratory infection, unspecified: Secondary | ICD-10-CM | POA: Diagnosis not present

## 2011-12-31 DIAGNOSIS — H01009 Unspecified blepharitis unspecified eye, unspecified eyelid: Secondary | ICD-10-CM | POA: Diagnosis not present

## 2012-01-04 DIAGNOSIS — N3946 Mixed incontinence: Secondary | ICD-10-CM | POA: Diagnosis not present

## 2012-01-05 DIAGNOSIS — F410 Panic disorder [episodic paroxysmal anxiety] without agoraphobia: Secondary | ICD-10-CM | POA: Diagnosis not present

## 2012-01-20 DIAGNOSIS — E785 Hyperlipidemia, unspecified: Secondary | ICD-10-CM | POA: Diagnosis not present

## 2012-01-20 DIAGNOSIS — R3919 Other difficulties with micturition: Secondary | ICD-10-CM | POA: Diagnosis not present

## 2012-01-20 DIAGNOSIS — Z124 Encounter for screening for malignant neoplasm of cervix: Secondary | ICD-10-CM | POA: Diagnosis not present

## 2012-02-17 DIAGNOSIS — J019 Acute sinusitis, unspecified: Secondary | ICD-10-CM | POA: Diagnosis not present

## 2012-03-01 DIAGNOSIS — K222 Esophageal obstruction: Secondary | ICD-10-CM

## 2012-03-01 HISTORY — DX: Esophageal obstruction: K22.2

## 2012-03-22 DIAGNOSIS — G5600 Carpal tunnel syndrome, unspecified upper limb: Secondary | ICD-10-CM | POA: Diagnosis not present

## 2012-03-22 DIAGNOSIS — Z79899 Other long term (current) drug therapy: Secondary | ICD-10-CM | POA: Diagnosis not present

## 2012-03-22 DIAGNOSIS — M25569 Pain in unspecified knee: Secondary | ICD-10-CM | POA: Diagnosis not present

## 2012-03-22 DIAGNOSIS — M545 Low back pain: Secondary | ICD-10-CM | POA: Diagnosis not present

## 2012-05-10 DIAGNOSIS — N3289 Other specified disorders of bladder: Secondary | ICD-10-CM | POA: Diagnosis not present

## 2012-05-29 ENCOUNTER — Ambulatory Visit: Payer: Self-pay | Admitting: Physician Assistant

## 2012-05-29 ENCOUNTER — Telehealth: Payer: Self-pay | Admitting: Physician Assistant

## 2012-05-29 NOTE — Telephone Encounter (Signed)
APPT MADE

## 2012-06-15 ENCOUNTER — Telehealth: Payer: Self-pay | Admitting: Physician Assistant

## 2012-06-15 ENCOUNTER — Ambulatory Visit (INDEPENDENT_AMBULATORY_CARE_PROVIDER_SITE_OTHER): Payer: Medicare Other | Admitting: Nurse Practitioner

## 2012-06-15 ENCOUNTER — Encounter: Payer: Self-pay | Admitting: Nurse Practitioner

## 2012-06-15 VITALS — BP 129/77 | HR 117 | Temp 97.0°F | Ht 63.0 in | Wt 157.5 lb

## 2012-06-15 DIAGNOSIS — J029 Acute pharyngitis, unspecified: Secondary | ICD-10-CM

## 2012-06-15 MED ORDER — AZITHROMYCIN 250 MG PO TABS: ORAL_TABLET | ORAL | Status: DC

## 2012-06-15 NOTE — Telephone Encounter (Signed)
Cough, ear pain, sore throat x 1 week.  Appt scheduled for this evening.

## 2012-06-15 NOTE — Patient Instructions (Addendum)

## 2012-06-15 NOTE — Progress Notes (Signed)
  Subjective:    Patient ID: Teresa [name], female    DOB: 1955-08-06, 57 y.o.   MRN: [id number]  HPI Patient in complaining of ear pain, sore throat, and sinus pressure . Started 1week. Has gotten bettre but then got worse since started. Associated symptoms include post nasal drip and productive cough. He has tried Mucinex OTC without relief     Review of Systems  Constitutional: Negative for fever and chills.  HENT: Positive for ear pain, sore throat, postnasal drip and sinus pressure. Negative for rhinorrhea.   Eyes: Positive for pain.  Respiratory: Positive for chest tightness and shortness of breath. Negative for wheezing.   Cardiovascular: Negative.   Neurological: Positive for headaches. Negative for dizziness.  All other systems reviewed and are negative.       Objective:   Physical Exam  Constitutional: She is oriented to person, place, and time. She appears well-developed and well-nourished.  HENT:  Head: Normocephalic.  Right Ear: External ear normal.  Left Ear: External ear normal.  Nose: Right sinus exhibits maxillary sinus tenderness. Left sinus exhibits maxillary sinus tenderness.  Mouth/Throat: Posterior oropharyngeal erythema present.  Eyes: Conjunctivae are normal. Pupils are equal, round, and reactive to light.  Neck: Normal range of motion. Neck supple.  Cardiovascular: Regular rhythm and normal heart sounds.  Tachycardia present.   Pulmonary/Chest: Effort normal. She has wheezes (expiratory lower lobes).  Neurological: She is alert and oriented to person, place, and time.  Skin: Skin is warm and dry.  Psychiatric: She has a normal mood and affect. Her behavior is normal. Judgment and thought content normal.    BP 129/77  Pulse 117  Temp(Src) 97 F (36.1 C) (Oral)  Ht 5\' 3"  (1.6 m)  Wt 157 lb 8 oz (71.442 kg)  BMI 27.91 kg/m2  Results for orders placed in visit on 06/15/12  POCT RAPID STREP A (OFFICE)      Result Value Range   Rapid Strep A  Screen Negative  Negative       Assessment & Plan:  1. Sore throat - POCT rapid strep A  2. Acute pharyngitis - azithromycin (ZITHROMAX Z-PAK) 250 MG tablet; Take as directed  Dispense: 6 each; Refill: 0 1. Take meds as prescribed 2. Use a cool mist humidifier especially during the winter months and when heat has  been humid. 3. Use saline nose sprays frequently 4. Saline irrigations of the nose can be very helpful if done frequently.  * 4X daily for 1 week*  * Use of a nettie pot can be helpful with this. Follow directions with this* 5. Drink plenty of fluids 6. Keep thermostat turn down low 7.For any cough or congestion  Use plain Mucinex- regular strength or max strength is fine   * Children- consult with Pharmacist for dosing 8. For fever or aces or pains- take tylenol or ibuprofen appropriate for age and weight.  * for fevers greater than 101 orally you may alternate ibuprofen and tylenol every  3 hours.   Mary-Margaret Daphine Deutscher, FNP

## 2012-06-23 DIAGNOSIS — M25569 Pain in unspecified knee: Secondary | ICD-10-CM | POA: Diagnosis not present

## 2012-06-23 DIAGNOSIS — M545 Low back pain: Secondary | ICD-10-CM | POA: Diagnosis not present

## 2012-06-23 DIAGNOSIS — Z79899 Other long term (current) drug therapy: Secondary | ICD-10-CM | POA: Diagnosis not present

## 2012-07-19 DIAGNOSIS — M159 Polyosteoarthritis, unspecified: Secondary | ICD-10-CM | POA: Diagnosis not present

## 2012-07-19 DIAGNOSIS — M545 Low back pain: Secondary | ICD-10-CM | POA: Diagnosis not present

## 2012-07-19 DIAGNOSIS — M25569 Pain in unspecified knee: Secondary | ICD-10-CM | POA: Diagnosis not present

## 2012-07-19 DIAGNOSIS — Z79899 Other long term (current) drug therapy: Secondary | ICD-10-CM | POA: Diagnosis not present

## 2012-07-21 ENCOUNTER — Other Ambulatory Visit: Payer: Self-pay | Admitting: *Deleted

## 2012-07-21 NOTE — Telephone Encounter (Signed)
If approved please route to nurse to [facility] [place] [phone].  Last script written 04/08/12 for #60

## 2012-07-22 MED ORDER — LORAZEPAM 1 MG PO TABS: 1.0000 mg | ORAL_TABLET | Freq: Two times a day (BID) | ORAL | Status: DC | PRN

## 2012-07-22 NOTE — Telephone Encounter (Signed)
This is okay with 5 refills

## 2012-07-25 ENCOUNTER — Ambulatory Visit (INDEPENDENT_AMBULATORY_CARE_PROVIDER_SITE_OTHER): Payer: Medicare Other | Admitting: Family Medicine

## 2012-07-25 ENCOUNTER — Telehealth: Payer: Self-pay | Admitting: Family Medicine

## 2012-07-25 ENCOUNTER — Encounter: Payer: Self-pay | Admitting: Family Medicine

## 2012-07-25 ENCOUNTER — Ambulatory Visit (INDEPENDENT_AMBULATORY_CARE_PROVIDER_SITE_OTHER): Payer: Medicare Other

## 2012-07-25 VITALS — BP 118/84 | HR 124 | Temp 97.6°F | Ht 62.5 in | Wt 166.0 lb

## 2012-07-25 DIAGNOSIS — J029 Acute pharyngitis, unspecified: Secondary | ICD-10-CM | POA: Diagnosis not present

## 2012-07-25 DIAGNOSIS — R059 Cough, unspecified: Secondary | ICD-10-CM | POA: Diagnosis not present

## 2012-07-25 DIAGNOSIS — R05 Cough: Secondary | ICD-10-CM | POA: Diagnosis not present

## 2012-07-25 DIAGNOSIS — R000 Tachycardia, unspecified: Secondary | ICD-10-CM | POA: Diagnosis not present

## 2012-07-25 DIAGNOSIS — J209 Acute bronchitis, unspecified: Secondary | ICD-10-CM | POA: Insufficient documentation

## 2012-07-25 DIAGNOSIS — R0602 Shortness of breath: Secondary | ICD-10-CM

## 2012-07-25 LAB — POCT CBC
Granulocyte percent: 72.3 %G (ref 37–80)
HCT, POC: 41.7 % (ref 37.7–47.9)
Hemoglobin: 14 g/dL (ref 12.2–16.2)
Lymph, poc: 1.4 (ref 0.6–3.4)
MCH, POC: 27.5 pg (ref 27–31.2)
MCHC: 33.6 g/dL (ref 31.8–35.4)
MCV: 81.7 fL (ref 80–97)
MPV: 8 fL (ref 0–99.8)
POC Granulocyte: 4.3 (ref 2–6.9)
POC LYMPH PERCENT: 22.6 %L (ref 10–50)
Platelet Count, POC: 175 10*3/uL (ref 142–424)
RBC: 5.1 M/uL (ref 4.04–5.48)
RDW, POC: 14.2 %
WBC: 6 10*3/uL (ref 4.6–10.2)

## 2012-07-25 LAB — THYROID PANEL WITH TSH
Free Thyroxine Index: 2.9 (ref 1.0–3.9)
T3 Uptake: 27.7 % (ref 22.5–37.0)
T4, Total: 10.4 ug/dL (ref 5.0–12.5)
TSH: 1.551 u[IU]/mL (ref 0.350–4.500)

## 2012-07-25 LAB — COMPLETE METABOLIC PANEL WITH GFR
ALT: 33 U/L (ref 0–35)
AST: 29 U/L (ref 0–37)
Albumin: 3.9 g/dL (ref 3.5–5.2)
Alkaline Phosphatase: 97 U/L (ref 39–117)
BUN: 7 mg/dL (ref 6–23)
CO2: 27 mEq/L (ref 19–32)
Calcium: 9 mg/dL (ref 8.4–10.5)
Chloride: 105 mEq/L (ref 96–112)
Creat: 0.67 mg/dL (ref 0.50–1.10)
GFR, Est African American: >89 mL/min
GFR, Est Non African American: >89 mL/min
Glucose, Bld: 104 mg/dL — ABNORMAL HIGH (ref 70–99)
Potassium: 3.9 mEq/L (ref 3.5–5.3)
Sodium: 140 mEq/L (ref 135–145)
Total Bilirubin: 1.1 mg/dL (ref 0.3–1.2)
Total Protein: 6.7 g/dL (ref 6.0–8.3)

## 2012-07-25 LAB — POCT RAPID STREP A (OFFICE): Rapid Strep A Screen: NEGATIVE

## 2012-07-25 MED ORDER — AMOXICILLIN 875 MG PO TABS: 875.0000 mg | ORAL_TABLET | Freq: Two times a day (BID) | ORAL | Status: DC

## 2012-07-25 MED ORDER — BENZONATATE 200 MG PO CAPS: 200.0000 mg | ORAL_CAPSULE | Freq: Two times a day (BID) | ORAL | Status: DC | PRN

## 2012-07-25 MED ORDER — LORAZEPAM 1 MG PO TABS: 1.0000 mg | ORAL_TABLET | Freq: Two times a day (BID) | ORAL | Status: DC | PRN

## 2012-07-25 NOTE — Addendum Note (Signed)
Addended by: Gwenith Daily on: 07/25/2012 05:08 PM   Modules accepted: Orders

## 2012-07-25 NOTE — Progress Notes (Signed)
Patient ID: Teresa [name], female   DOB: [birth year], 57 y.o.   MRN: [id number] SUBJECTIVE: HPI: Having cough and congestion. Not sure whether it is allergy or upper respiratory infection. Had OTC cough medicine and noticed that her heart rate is also faster. No chest pain. No palpitations. No pedal edema. No night sweats.  PMH/PSH: reviewed/updated in Epic  SH/FH: reviewed/updated in Epic  Allergies: reviewed/updated in Epic  Medications: reviewed/updated in Epic  Immunizations: reviewed/updated in Epic  ROS: As above in the HPI. All other systems are stable or negative.  OBJECTIVE: APPEARANCE:  Patient in no acute distress.The patient appeared well nourished and normally developed. Acyanotic. Waist: VITAL SIGNS:BP 118/84  Pulse 124  Temp(Src) 97.6 F (36.4 C) (Oral)  Ht 5' 2.5" (1.588 m)  Wt 166 lb (75.297 kg)  BMI 29.86 kg/m2  SpO2 97% Obese WF   SKIN: warm and  Dry without overt rashes, tattoos and scars  HEAD and Neck: without JVD, Head and scalp: normal Eyes:No scleral icterus. Fundi normal, eye movements normal. Ears: Auricle normal, canal normal, Tympanic membranes normal, insufflation normal. Nose: normal Throat: normal Neck & thyroid: normal  CHEST & LUNGS: Chest wall: normal Lungs: Rhonchi and rattling cough. No Rales , no Wheezes.  CVS: Reveals the PMI to be normally located. Regular rhythm,Tachycardic 120 to 130 First and Second Heart sounds are normal,  absence of murmurs, rubs or gallops. Peripheral vasculature: Radial pulses: normal Dorsal pedis pulses: normal Posterior pulses: normal  EKG= sinus Tachycardia.  ABDOMEN:  Appearance: normal Benign,, no organomegaly, no masses, no Abdominal Aortic enlargement. No Guarding , no rebound. No Bruits. Bowel sounds: normal  RECTAL: N/A GU: N/A  EXTREMETIES: nonedematous. Both Femoral and Pedal pulses are normal.  MUSCULOSKELETAL:  Spine: normal Joints: intact  NEUROLOGIC: oriented to  time,place and person; nonfocal. Strength is normal Sensory is normal Reflexes are normal Cranial Nerves are normal.  Chest Xray: [facility] reading (PRIMARY) by  Dr.Cianna Kasparian: no acute findings.                          ASSESSMENT: Cough - Plan: DG Chest 2 View, POCT CBC, benzonatate (TESSALON) 200 MG capsule  Sore throat - Plan: POCT rapid strep A  SOB (shortness of breath) - Plan: EKG 12-Lead, POCT CBC  Tachycardia - Plan: POCT CBC, Thyroid Panel With TSH, COMPLETE METABOLIC PANEL WITH GFR  Acute bronchitis - Plan: amoxicillin (AMOXIL) 875 MG tablet  PLAN: Orders Placed This Encounter  Procedures  . DG Chest 2 View    Standing Status: Future     Number of Occurrences: 1     Standing Expiration Date: 09/24/2013    Order Specific Question:  Reason for Exam (SYMPTOM  OR DIAGNOSIS REQUIRED)    Answer:  cough    Order Specific Question:  Is the patient pregnant?    Answer:  No    Order Specific Question:  Preferred imaging location?    Answer:  Internal  . Thyroid Panel With TSH  . COMPLETE METABOLIC PANEL WITH GFR  . POCT rapid strep A  . POCT CBC  . EKG 12-Lead   Results for orders placed in visit on 07/25/12  POCT RAPID STREP A (OFFICE)      Result Value Range   Rapid Strep A Screen Negative  Negative  POCT CBC      Result Value Range   WBC 6.0  4.6 - 10.2 K/uL   Lymph, poc 1.4  0.6 -  3.4   POC LYMPH PERCENT 22.6  10 - 50 %L   POC Granulocyte 4.3  2 - 6.9   Granulocyte percent 72.3  37 - 80 %G   RBC 5.1  4.04 - 5.48 M/uL   Hemoglobin 14.0  12.2 - 16.2 g/dL   HCT, POC 16.1  09.6 - 47.9 %   MCV 81.7  80 - 97 fL   MCH, POC 27.5  27 - 31.2 pg   MCHC 33.6  31.8 - 35.4 g/dL   RDW, POC 04.5     Platelet Count, POC 175.0  142 - 424 K/uL   MPV 8.0  0 - 99.8 fL   Results for orders placed in visit on 07/25/12  POCT RAPID STREP A (OFFICE)      Result Value Range   Rapid Strep A Screen Negative  Negative  POCT CBC      Result Value Range   WBC 6.0  4.6 - 10.2 K/uL    Lymph, poc 1.4  0.6 - 3.4   POC LYMPH PERCENT 22.6  10 - 50 %L   POC Granulocyte 4.3  2 - 6.9   Granulocyte percent 72.3  37 - 80 %G   RBC 5.1  4.04 - 5.48 M/uL   Hemoglobin 14.0  12.2 - 16.2 g/dL   HCT, POC 40.9  81.1 - 47.9 %   MCV 81.7  80 - 97 fL   MCH, POC 27.5  27 - 31.2 pg   MCHC 33.6  31.8 - 35.4 g/dL   RDW, POC 91.4     Platelet Count, POC 175.0  142 - 424 K/uL   MPV 8.0  0 - 99.8 fL    Meds ordered this encounter  Medications  . celecoxib (CELEBREX) 200 MG capsule    Sig: Take 200 mg by mouth daily.  Marland Kitchen amoxicillin (AMOXIL) 875 MG tablet    Sig: Take 1 tablet (875 mg total) by mouth 2 (two) times daily.    Dispense:  20 tablet    Refill:  0  . benzonatate (TESSALON) 200 MG capsule    Sig: Take 1 capsule (200 mg total) by mouth 2 (two) times daily as needed for cough.    Dispense:  20 capsule    Refill:  0   RTC in 1 week. D/C OTC meds. Fluids rest. Discussed with patient.  Kylyn Sookram P. [place] Charon, M.D.

## 2012-07-25 NOTE — Telephone Encounter (Signed)
Called into pharmacy

## 2012-07-25 NOTE — Telephone Encounter (Signed)
appt made for today 

## 2012-07-26 NOTE — Progress Notes (Signed)
Quick Note:  Call patient. Labs normal. No change in plan. ______ 

## 2012-07-27 ENCOUNTER — Other Ambulatory Visit: Payer: Self-pay | Admitting: *Deleted

## 2012-07-27 MED ORDER — MONTELUKAST SODIUM 10 MG PO TABS: 10.0000 mg | ORAL_TABLET | Freq: Every day | ORAL | Status: DC

## 2012-08-02 ENCOUNTER — Ambulatory Visit (INDEPENDENT_AMBULATORY_CARE_PROVIDER_SITE_OTHER): Payer: Medicare Other | Admitting: Family Medicine

## 2012-08-02 ENCOUNTER — Encounter: Payer: Self-pay | Admitting: Family Medicine

## 2012-08-02 VITALS — BP 133/93 | HR 100 | Temp 97.7°F | Ht 62.5 in | Wt 166.0 lb

## 2012-08-02 DIAGNOSIS — F1011 Alcohol abuse, in remission: Secondary | ICD-10-CM

## 2012-08-02 DIAGNOSIS — F411 Generalized anxiety disorder: Secondary | ICD-10-CM

## 2012-08-02 DIAGNOSIS — F1921 Other psychoactive substance dependence, in remission: Secondary | ICD-10-CM

## 2012-08-02 DIAGNOSIS — R000 Tachycardia, unspecified: Secondary | ICD-10-CM

## 2012-08-02 DIAGNOSIS — R059 Cough, unspecified: Secondary | ICD-10-CM

## 2012-08-02 DIAGNOSIS — R05 Cough: Secondary | ICD-10-CM

## 2012-08-02 NOTE — Progress Notes (Signed)
Patient ID: Teresa [name], female   DOB: May 15, 1955, 57 y.o.   MRN: [id number] SUBJECTIVE: Chief Complaint  Patient presents with  . Cough    1 week rck  . elevated heart rate   HPI: Here for  Recheck cough improving. No fevers. Stressed due to the fact that her child and grandchild are drug addicts. They go from home to home. Patient herself admits to a past history of Rx substance abuse.   PMH/PSH: reviewed/updated in Epic  SH/FH: reviewed/updated in Epic  Allergies: reviewed/updated in Epic  Medications: reviewed/updated in Epic  Immunizations: reviewed/updated in Epic  ROS: As above in the HPI. All other systems are stable or negative.  OBJECTIVE: APPEARANCE:  Patient in no acute distress.The patient appeared well nourished and normally developed. Acyanotic. Waist: VITAL SIGNS:BP 133/93  Pulse 100  Temp(Src) 97.7 F (36.5 C) (Oral)  Ht 5' 2.5" (1.588 m)  Wt 166 lb (75.297 kg)  BMI 29.86 kg/m2 WF overweight.  SKIN: warm and  Dry without overt rashes, tattoos and scars  HEAD and Neck: without JVD, Head and scalp: normal Eyes:No scleral icterus. Fundi normal, eye movements normal. Ears: Auricle normal, canal normal, Tympanic membranes normal, insufflation normal. Nose: normal Throat: normal Neck & thyroid: normal  CHEST & LUNGS: Chest wall: normal Lungs: Clear. occassional cough  CVS: Reveals the PMI to be normally located. Regular rhythm, First and Second Heart sounds are normal,  absence of murmurs, rubs or gallops. Peripheral vasculature: Radial pulses: normal Dorsal pedis pulses: normal Posterior pulses: normal  ABDOMEN:  Appearance:obese Benign, no organomegaly, no masses, no Abdominal Aortic enlargement. No Guarding , no rebound. No Bruits. Bowel sounds: normal  RECTAL: N/A GU: N/A  EXTREMETIES: nonedematous. Both Femoral and Pedal pulses are normal.  MUSCULOSKELETAL:  Spine: normal Joints: intact  NEUROLOGIC: oriented to time,place  and person; nonfocal. Strength is normal Sensory is normal Reflexes are normal Cranial Nerves are normal. Results for orders placed in visit on 07/25/12  THYROID PANEL WITH TSH      Result Value Range   T4, Total 10.4  5.0 - 12.5 ug/dL   T3 Uptake 10.2  72.5 - 37.0 %   Free Thyroxine Index 2.9  1.0 - 3.9   TSH 1.551  0.350 - 4.500 uIU/mL  COMPLETE METABOLIC PANEL WITH GFR      Result Value Range   Sodium 140  135 - 145 mEq/L   Potassium 3.9  3.5 - 5.3 mEq/L   Chloride 105  96 - 112 mEq/L   CO2 27  19 - 32 mEq/L   Glucose, Bld 104 (*) 70 - 99 mg/dL   BUN 7  6 - 23 mg/dL   Creat 3.66  4.40 - 3.47 mg/dL   Total Bilirubin 1.1  0.3 - 1.2 mg/dL   Alkaline Phosphatase 97  39 - 117 U/L   AST 29  0 - 37 U/L   ALT 33  0 - 35 U/L   Total Protein 6.7  6.0 - 8.3 g/dL   Albumin 3.9  3.5 - 5.2 g/dL   Calcium 9.0  8.4 - 42.5 mg/dL   GFR, Est African American >89     GFR, Est Non African American >89    POCT RAPID STREP A (OFFICE)      Result Value Range   Rapid Strep A Screen Negative  Negative  POCT CBC      Result Value Range   WBC 6.0  4.6 - 10.2 K/uL   Lymph, poc  1.4  0.6 - 3.4   POC LYMPH PERCENT 22.6  10 - 50 %L   POC Granulocyte 4.3  2 - 6.9   Granulocyte percent 72.3  37 - 80 %G   RBC 5.1  4.04 - 5.48 M/uL   Hemoglobin 14.0  12.2 - 16.2 g/dL   HCT, POC 16.1  09.6 - 47.9 %   MCV 81.7  80 - 97 fL   MCH, POC 27.5  27 - 31.2 pg   MCHC 33.6  31.8 - 35.4 g/dL   RDW, POC 04.5     Platelet Count, POC 175.0  142 - 424 K/uL   MPV 8.0  0 - 99.8 fL    ASSESSMENT: Tachycardia  Cough - resolving  ANXIETY  Alcohol abuse, in remission  DRUG ABUSE, IN REMISSION   PLAN: Stress reduction. No orders of the defined types were placed in this encounter.   No orders of the defined types were placed in this encounter.   Return in about 4 months (around 12/02/2012) for Recheck medical problems.  Clela Hagadorn P. [place] Charon, M.D.

## 2012-08-03 ENCOUNTER — Encounter (HOSPITAL_COMMUNITY): Payer: Self-pay | Admitting: Emergency Medicine

## 2012-08-03 ENCOUNTER — Observation Stay (HOSPITAL_COMMUNITY)
Admission: EM | Admit: 2012-08-03 | Discharge: 2012-08-04 | Disposition: A | Discharge status: Home / Self Care | Payer: Medicare Other | Attending: Internal Medicine | Admitting: Internal Medicine

## 2012-08-03 ENCOUNTER — Emergency Department (HOSPITAL_COMMUNITY): Payer: Medicare Other

## 2012-08-03 DIAGNOSIS — K219 Gastro-esophageal reflux disease without esophagitis: Secondary | ICD-10-CM | POA: Diagnosis not present

## 2012-08-03 DIAGNOSIS — K5900 Constipation, unspecified: Secondary | ICD-10-CM | POA: Diagnosis not present

## 2012-08-03 DIAGNOSIS — J9801 Acute bronchospasm: Secondary | ICD-10-CM | POA: Diagnosis not present

## 2012-08-03 DIAGNOSIS — J209 Acute bronchitis, unspecified: Secondary | ICD-10-CM

## 2012-08-03 DIAGNOSIS — K449 Diaphragmatic hernia without obstruction or gangrene: Secondary | ICD-10-CM | POA: Insufficient documentation

## 2012-08-03 DIAGNOSIS — R079 Chest pain, unspecified: Secondary | ICD-10-CM

## 2012-08-03 DIAGNOSIS — F411 Generalized anxiety disorder: Secondary | ICD-10-CM | POA: Diagnosis not present

## 2012-08-03 DIAGNOSIS — R0789 Other chest pain: Secondary | ICD-10-CM | POA: Diagnosis not present

## 2012-08-03 LAB — CBC WITH DIFFERENTIAL/PLATELET
Lymphocytes Relative: 23 % (ref 12–46)
Lymphs Abs: 1.9 10*3/uL (ref 0.7–4.0)
Neutrophils Relative %: 68 % (ref 43–77)
Platelets: 190 10*3/uL (ref 150–400)
RBC: 4.67 MIL/uL (ref 3.87–5.11)
WBC: 8.3 10*3/uL (ref 4.0–10.5)

## 2012-08-03 LAB — BASIC METABOLIC PANEL
CO2: 24 mEq/L (ref 19–32)
GFR calc non Af Amer: 75 mL/min — ABNORMAL LOW (ref >90)
Glucose, Bld: 123 mg/dL — ABNORMAL HIGH (ref 70–99)
Potassium: 3.4 mEq/L — ABNORMAL LOW (ref 3.5–5.1)
Sodium: 140 mEq/L (ref 135–145)

## 2012-08-03 LAB — TROPONIN I: Troponin I: <0.3 ng/mL (ref <0.30)

## 2012-08-03 MED ORDER — ASPIRIN EC 81 MG PO TBEC
324.0000 mg | DELAYED_RELEASE_TABLET | Freq: Every day | ORAL | Status: DC
  Administered 2012-08-04: 324 mg via ORAL
  Filled 2012-08-03: qty 1

## 2012-08-03 MED ORDER — ASPIRIN 81 MG PO CHEW
324.0000 mg | CHEWABLE_TABLET | Freq: Once | ORAL | Status: AC
  Administered 2012-08-03: 324 mg via ORAL
  Filled 2012-08-03: qty 4

## 2012-08-03 MED ORDER — SODIUM CHLORIDE 0.9 % IJ SOLN
3.0000 mL | Freq: Two times a day (BID) | INTRAMUSCULAR | Status: DC
  Administered 2012-08-03: 3 mL via INTRAVENOUS

## 2012-08-03 MED ORDER — SODIUM CHLORIDE 0.9 % IV SOLN
INTRAVENOUS | Status: AC
  Administered 2012-08-03: 23:00:00 via INTRAVENOUS

## 2012-08-03 MED ORDER — PANTOPRAZOLE SODIUM 40 MG PO TBEC
80.0000 mg | DELAYED_RELEASE_TABLET | Freq: Two times a day (BID) | ORAL | Status: DC
  Administered 2012-08-04: 80 mg via ORAL
  Filled 2012-08-03: qty 2

## 2012-08-03 MED ORDER — SODIUM CHLORIDE 0.9 % IJ SOLN: 3.0000 mL | INTRAMUSCULAR | Status: DC | PRN

## 2012-08-03 MED ORDER — BENZONATATE 100 MG PO CAPS: 200.0000 mg | ORAL_CAPSULE | Freq: Two times a day (BID) | ORAL | Status: DC | PRN

## 2012-08-03 MED ORDER — ALUM & MAG HYDROXIDE-SIMETH 200-200-20 MG/5ML PO SUSP
30.0000 mL | Freq: Four times a day (QID) | ORAL | Status: DC
  Administered 2012-08-03 – 2012-08-04 (×3): 30 mL via ORAL
  Filled 2012-08-03 (×3): qty 30

## 2012-08-03 MED ORDER — ONDANSETRON HCL 4 MG/2ML IJ SOLN: 4.0000 mg | Freq: Four times a day (QID) | INTRAMUSCULAR | Status: DC | PRN

## 2012-08-03 MED ORDER — SODIUM CHLORIDE 0.9 % IV SOLN: 250.0000 mL | INTRAVENOUS | Status: DC | PRN

## 2012-08-03 MED ORDER — LORATADINE 10 MG PO TABS
10.0000 mg | ORAL_TABLET | Freq: Every day | ORAL | Status: DC
  Administered 2012-08-04: 10 mg via ORAL
  Filled 2012-08-03: qty 1

## 2012-08-03 MED ORDER — NITROGLYCERIN 2 % TD OINT
1.0000 [in_us] | TOPICAL_OINTMENT | Freq: Once | TRANSDERMAL | Status: AC
  Administered 2012-08-03: 1 [in_us] via TOPICAL
  Filled 2012-08-03: qty 1

## 2012-08-03 MED ORDER — ONDANSETRON HCL 4 MG/2ML IJ SOLN
4.0000 mg | Freq: Once | INTRAMUSCULAR | Status: AC
  Administered 2012-08-03: 4 mg via INTRAVENOUS
  Filled 2012-08-03: qty 2

## 2012-08-03 MED ORDER — ALBUTEROL SULFATE HFA 108 (90 BASE) MCG/ACT IN AERS: 2.0000 | INHALATION_SPRAY | Freq: Four times a day (QID) | RESPIRATORY_TRACT | Status: DC | PRN

## 2012-08-03 MED ORDER — POLYETHYLENE GLYCOL 3350 17 G PO PACK
17.0000 g | PACK | Freq: Every day | ORAL | Status: DC | PRN
  Administered 2012-08-04: 17 g via ORAL
  Filled 2012-08-03: qty 1

## 2012-08-03 MED ORDER — ENOXAPARIN SODIUM 40 MG/0.4ML ~~LOC~~ SOLN
40.0000 mg | SUBCUTANEOUS | Status: DC
  Administered 2012-08-03: 40 mg via SUBCUTANEOUS
  Filled 2012-08-03: qty 0.4

## 2012-08-03 MED ORDER — BISACODYL 10 MG RE SUPP
10.0000 mg | Freq: Once | RECTAL | Status: AC
  Administered 2012-08-03: 10 mg via RECTAL
  Filled 2012-08-03: qty 1

## 2012-08-03 MED ORDER — LUBIPROSTONE 8 MCG PO CAPS
8.0000 ug | ORAL_CAPSULE | Freq: Two times a day (BID) | ORAL | Status: DC
  Administered 2012-08-04: 8 ug via ORAL
  Filled 2012-08-03 (×7): qty 1

## 2012-08-03 MED ORDER — MORPHINE SULFATE 4 MG/ML IJ SOLN
4.0000 mg | Freq: Once | INTRAMUSCULAR | Status: AC
  Administered 2012-08-03: 4 mg via INTRAVENOUS
  Filled 2012-08-03: qty 1

## 2012-08-03 MED ORDER — MONTELUKAST SODIUM 10 MG PO TABS
10.0000 mg | ORAL_TABLET | Freq: Every day | ORAL | Status: DC
Start: 2012-08-03 — End: 2012-08-04
  Administered 2012-08-03: 10 mg via ORAL
  Filled 2012-08-03: qty 1

## 2012-08-03 MED ORDER — LORAZEPAM 1 MG PO TABS
1.0000 mg | ORAL_TABLET | Freq: Two times a day (BID) | ORAL | Status: DC | PRN
  Administered 2012-08-03 – 2012-08-04 (×2): 1 mg via ORAL
  Filled 2012-08-03 (×2): qty 1

## 2012-08-03 MED ORDER — ONDANSETRON HCL 4 MG PO TABS: 4.0000 mg | ORAL_TABLET | Freq: Four times a day (QID) | ORAL | Status: DC | PRN

## 2012-08-03 NOTE — ED Notes (Signed)
EDP made aware of pt's CP, awaiting for orders

## 2012-08-03 NOTE — ED Provider Notes (Signed)
History     CSN: [id number]  Arrival date & time 08/03/12  1739   First MD Initiated Contact with Patient 08/03/12 1759      Chief Complaint  Patient presents with  . Chest Pain    (Consider location/radiation/quality/duration/timing/severity/associated sxs/prior treatment) HPI Comments: Teresa [name] is a 57 y.o. female who reports onset of intermittent chest pressure or several days ago. The pain is intermittent. It is occasionally 6 to 7/10. The pain is present on arrival in the emergency department. It has been constant since early this morning. The pain is a pressure-like and smothering sensation. She has occasional cough that has not improved with amoxicillin and an albuterol inhaler that was prescribed about 6 days ago. She saw her doctor yesterday for a checkup. No additional treatments were given. She's never had a cardiac evaluation. She stopped smoking 20 years ago. She denies nausea or vomiting. She has mild, epigastric discomfort that feels like her typical hiatal hernia pain. There are no other known modifying factors.  Patient is a 57 y.o. female presenting with chest pain. The history is provided by the patient.  Chest Pain   Past Medical History  Diagnosis Date  . Diverticulosis 07-08-2005    colonoscopy  . Hiatal hernia 07-08-2005    EGD  . GERD (gastroesophageal reflux disease) 07-08-2005    EGD  . Irritable bowel syndrome   . Vocal cord polyp     history of  . Degeneration of intervertebral disc, site unspecified   . Osteoarthrosis, unspecified whether generalized or localized, lower leg   . Depressive disorder, not elsewhere classified   . Anxiety state, unspecified   . Full dentures   . Seizures 1990s    x 1, unknown cause; no seizures since  . Chronic back pain greater than 3 months duration   . Seasonal allergies   . Papilloma of breast 09/2011    left  . Asthma     prn inhaler    Past Surgical History  Procedure Laterality Date  . Cholecystectomy     . Bladder surgery      bladder tack  . 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 Cystoscopy  08/24/2011    Procedure: CYSTOSCOPY;  Surgeon: Martina Sinner, MD;  Location: [facility];  Service: Urology;  Laterality: N/A;  Cystoscopy,  Rectocele Repair and Vault Prolapse Repair  . Rectocele repair  08/24/2011    Procedure: POSTERIOR REPAIR (RECTOCELE);  Surgeon: Martina Sinner, MD;  Location: [facility];  Service: Urology;  Laterality: N/A;  . Vaginal prolapse repair  08/24/2011    Procedure: VAGINAL VAULT SUSPENSION;  Surgeon: Martina Sinner, MD;  Location: [facility];  Service: Urology;  Laterality: N/A;  . Abdominal hysterectomy      partial  . Bilateral salpingoophorectomy    . Other surgical history      exc. vocal cord polyp  . Breast biopsy  11/03/2011    Procedure: BREAST BIOPSY WITH NEEDLE LOCALIZATION;  Surgeon: Shelly Rubenstein, MD;  Location: [facility];  Service: General;  Laterality: Left;  needle localized left breast biopsy    Family History  Problem Relation Age of Onset  . Prostate cancer Father   . Stomach cancer Maternal Uncle   . Diabetes Maternal Grandmother   . Heart disease Paternal Grandmother   . Irritable bowel syndrome      History  Substance Use Topics  . Smoking status: Former Smoker    Quit date: 08/20/1991  .  Smokeless tobacco: Never Used  . Alcohol Use: No    OB History   Grav Para Term Preterm Abortions TAB SAB Ect Mult Living                  Review of Systems  Cardiovascular: Positive for chest pain.  All other systems reviewed and are negative.    Allergies  Arthrotec; Naproxen; Prozac; Vicodin; Ambien; and Gabapentin  Home Medications   No current outpatient prescriptions on file.  BP 114/81  Pulse 97  Temp(Src) 97.8 F (36.6 C) (Oral)  Resp 18  Ht 5\' 3"  (1.6 m)  Wt 166 lb 10.7 oz (75.6 kg)  BMI 29.53 kg/m2  SpO2 94%  Physical Exam  Nursing note and vitals reviewed. Constitutional: She is oriented to  person, place, and time. She appears well-developed.  She appears older than her stated age  HENT:  Head: Normocephalic and atraumatic.  Eyes: Conjunctivae and EOM are normal. Pupils are equal, round, and reactive to light.  Neck: Normal range of motion and phonation normal. Neck supple.  Cardiovascular: Normal rate, regular rhythm and intact distal pulses.   Pulmonary/Chest: Effort normal and breath sounds normal. No respiratory distress. She has no wheezes. She has no rales. She exhibits no tenderness.  No rhonchi. Left anterior chest wall tenderness, without crepitation.  Abdominal: Soft. She exhibits no distension and no mass. There is no tenderness. There is no guarding.  Musculoskeletal: Normal range of motion.  Neurological: She is alert and oriented to person, place, and time. She has normal strength. She exhibits normal muscle tone.  Skin: Skin is warm and dry.  Psychiatric: Her behavior is normal. Judgment and thought content normal.  She appears depressed    ED Course  Procedures (including critical care time)  Medications  0.9 %  sodium chloride infusion (not administered)  nitroGLYCERIN (NITROGLYN) 2 % ointment 1 inch (not administered)  aspirin EC tablet 324 mg (not administered)  montelukast (SINGULAIR) tablet 10 mg (not administered)  benzonatate (TESSALON) capsule 200 mg (not administered)  LORazepam (ATIVAN) tablet 1 mg (not administered)  loratadine (CLARITIN) tablet 10 mg (not administered)  albuterol (PROVENTIL HFA;VENTOLIN HFA) 108 (90 BASE) MCG/ACT inhaler 2 puff (not administered)  pantoprazole (PROTONIX) EC tablet 80 mg (not administered)  lubiprostone (AMITIZA) capsule 8 mcg (not administered)  polyethylene glycol (MIRALAX / GLYCOLAX) packet 17 g (not administered)  enoxaparin (LOVENOX) injection 40 mg (not administered)  sodium chloride 0.9 % injection 3 mL (not administered)  sodium chloride 0.9 % injection 3 mL (not administered)  0.9 %  sodium chloride  infusion (not administered)  ondansetron (ZOFRAN) tablet 4 mg (not administered)    Or  ondansetron (ZOFRAN) injection 4 mg (not administered)  alum & mag hydroxide-simeth (MAALOX/MYLANTA) 200-200-20 MG/5ML suspension 30 mL (not administered)  bisacodyl (DULCOLAX) suppository 10 mg (not administered)  aspirin chewable tablet 324 mg (324 mg Oral Given 08/03/12 1822)  nitroGLYCERIN (NITROGLYN) 2 % ointment 1 inch (1 inch Topical Given 08/03/12 1823)  morphine 4 MG/ML injection 4 mg (4 mg Intravenous Given 08/03/12 2052)  ondansetron (ZOFRAN) injection 4 mg (4 mg Intravenous Given 08/03/12 2052)    Patient Vitals for the past 24 hrs:  BP Temp Temp src Pulse Resp SpO2 Height Weight  08/03/12 2130 114/81 mmHg 97.8 F (36.6 C) Oral 97 18 94 % 5\' 3"  (1.6 m) 166 lb 10.7 oz (75.6 kg)  08/03/12 2107 121/75 mmHg - - 98 17 95 % - -  08/03/12 2000 128/81 mmHg - -  100 19 95 % - -  08/03/12 1900 127/77 mmHg - - 99 21 97 % - -  08/03/12 1845 - - - 98 20 96 % - -  08/03/12 1800 121/74 mmHg - - 109 19 96 % - -  08/03/12 1754 127/80 mmHg 98.2 F (36.8 C) Oral 125 17 95 % 5\' 3"  (1.6 m) 165 lb (74.844 kg)   6:15 PM Reevaluation with update and discussion. After initial assessment and treatment, an updated evaluation reveals no change in pain, after nitroglycerin. Still complaining of chest pain, morphine, ordered. Brookelin Felber L   CRITICAL CARE Performed by: Mancel Bale L Total critical care time: 45 minutes Critical care time was exclusive of separately billable procedures and treating other patients. Critical care was necessary to treat or prevent imminent or life-threatening deterioration. Critical care was time spent personally by me on the following activities: development of treatment plan with patient and/or surrogate as well as nursing, discussions with consultants, evaluation of patient's response to treatment, examination of patient, obtaining history from patient or surrogate, ordering and performing  treatments and interventions, ordering and review of laboratory studies, ordering and review of radiographic studies, pulse oximetry and re-evaluation of patient's condition.    Date: 08/03/12  Rate: 110  Rhythm: normal sinus rhythm  QRS Axis: normal  PR and QT Intervals: normal  ST/T Wave abnormalities: normal  PR and QRS Conduction Disutrbances:none  Narrative Interpretation:   Old EKG Reviewed: changes noted- 08/20/11- rate faster     Labs Reviewed  BASIC METABOLIC PANEL - Abnormal; Notable for the following:    Potassium 3.4 (*)    Glucose, Bld 123 (*)    GFR calc non Af Amer 75 (*)    GFR calc Af Amer 87 (*)    All other components within normal limits  TROPONIN I  CBC WITH DIFFERENTIAL  D-DIMER, QUANTITATIVE  CBC  COMPREHENSIVE METABOLIC PANEL  TROPONIN I  TROPONIN I  TROPONIN I   Dg Chest Portable 1 View  08/03/2012   *RADIOLOGY REPORT*  Clinical Data: Chest pain  PORTABLE CHEST - 1 VIEW  Comparison: 07/25/2012  Findings: Midline trachea.  Normal heart size and mediastinal contours. No pleural effusion or pneumothorax.  Suspect minimal scarring or subsegmental atelectasis at the left lung base.  IMPRESSION: No acute cardiopulmonary disease.   Original Report Authenticated By: Jeronimo Greaves, M.D.     1. Chest pain   2. Anxiety state, unspecified   3. Constipation   4. Esophageal reflux       MDM  Chest pain, nonspecific, worsening, despite treatment for bronchitis. Her chest pain is worsening despite treatment in the outpatient setting. Initial evaluation for cardiac disease; negative. Chest pain is atypical for coronary disease, however, she is at risk and has never been evaluated for cardiac disease, previously. She needs to be admitted for observation, stabilization, and risk stratification   Nursing Notes Reviewed/ Care Coordinated, and agree without changes. Applicable Imaging Reviewed. Radiologic imaging report reviewed and images by radiography  - viewed, by  me. Interpretation of Laboratory Data incorporated into ED treatment   Plan: Admit     Flint Melter, MD 08/03/12 2209

## 2012-08-03 NOTE — H&P (Signed)
PCP:   Redmond Baseman, MD   Chief Complaint:  Chest pain  HPI: 57 year old female who came to the hospital today for chest pain, patient was diagnosed with bronchitis last week and was prescribed amoxicillin. Patient has taken amoxicillin offer more than 10 days. She still complains of cough. Patient does have a history of hiatal hernia and GERD and has been taking Dexilant, she does complain of bloating and constipation. She has been taking Amitiza for constipation. Patient does not have history of heart problems. She does not smoke cigarettes, though she does have significant family history of cardiac problems. Today she denies nausea vomiting or diarrhea. Does contain of constipation and passing gas. Denies shortness of breath, does complain of discomfort in throat associated with chest pain.  Allergies:   Allergies  Allergen Reactions  . Arthrotec (Diclofenac-Misoprostol)   . Naproxen     Irritates stomach  . Prozac (Fluoxetine Hcl)     "makes me feel bad"  . Vicodin (Hydrocodone-Acetaminophen)   . Ambien (Zolpidem Tartrate) Other (See Comments)    COULDN'T FUNCTION, STAYED SLEEPY  . Gabapentin Other (See Comments)    COULDN'T FUNCTION, STAYED SLEEPY      Past Medical History  Diagnosis Date  . Diverticulosis 07-08-2005    colonoscopy  . Hiatal hernia 07-08-2005    EGD  . GERD (gastroesophageal reflux disease) 07-08-2005    EGD  . Irritable bowel syndrome   . Vocal cord polyp     history of  . Degeneration of intervertebral disc, site unspecified   . Osteoarthrosis, unspecified whether generalized or localized, lower leg   . Depressive disorder, not elsewhere classified   . Anxiety state, unspecified   . Full dentures   . Seizures 1990s    x 1, unknown cause; no seizures since  . Chronic back pain greater than 3 months duration   . Seasonal allergies   . Papilloma of breast 09/2011    left  . Asthma     prn inhaler    Past Surgical History  Procedure  Laterality Date  . Cholecystectomy    . Bladder surgery      bladder tack  . 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 Cystoscopy  08/24/2011    Procedure: CYSTOSCOPY;  Surgeon: Martina Sinner, MD;  Location: [facility];  Service: Urology;  Laterality: N/A;  Cystoscopy,  Rectocele Repair and Vault Prolapse Repair  . Rectocele repair  08/24/2011    Procedure: POSTERIOR REPAIR (RECTOCELE);  Surgeon: Martina Sinner, MD;  Location: [facility];  Service: Urology;  Laterality: N/A;  . Vaginal prolapse repair  08/24/2011    Procedure: VAGINAL VAULT SUSPENSION;  Surgeon: Martina Sinner, MD;  Location: [facility];  Service: Urology;  Laterality: N/A;  . Abdominal hysterectomy      partial  . Bilateral salpingoophorectomy    . Other surgical history      exc. vocal cord polyp  . Breast biopsy  11/03/2011    Procedure: BREAST BIOPSY WITH NEEDLE LOCALIZATION;  Surgeon: Shelly Rubenstein, MD;  Location: [facility];  Service: General;  Laterality: Left;  needle localized left breast biopsy    Prior to Admission medications   Medication Sig Start Date End Date Taking? Authorizing Provider  albuterol (PROAIR HFA) 108 (90 BASE) MCG/ACT inhaler Inhale 2 puffs into the lungs every 6 (six) hours as needed. Wheezing and shortness of breath   Yes Historical Provider, MD  amoxicillin (AMOXIL) 875 MG tablet Take 1  tablet (875 mg total) by mouth 2 (two) times daily. 07/25/12  Yes Ileana Ladd, MD  benzonatate (TESSALON) 200 MG capsule Take 1 capsule (200 mg total) by mouth 2 (two) times daily as needed for cough. 07/25/12  Yes Ileana Ladd, MD  celecoxib (CELEBREX) 200 MG capsule Take 200 mg by mouth daily.   Yes Historical Provider, MD  cetirizine (ZYRTEC) 10 MG tablet Take 10 mg by mouth every morning.    Yes Historical Provider, MD  cyclobenzaprine (FLEXERIL) 10 MG tablet Take 10 mg by mouth 3 (three) times daily as needed. spasms   Yes Historical Provider, MD  dexlansoprazole (DEXILANT) 60 MG  capsule Take 60 mg by mouth daily.    Yes Historical Provider, MD  Fiber Complete TABS Take 1 tablet by mouth daily.     Yes Historical Provider, MD  LORazepam (ATIVAN) 1 MG tablet Take 1 tablet (1 mg total) by mouth 2 (two) times daily as needed for anxiety. 07/25/12  Yes Ernestina Penna, MD  lubiprostone (AMITIZA) 8 MCG capsule Take 8 mcg by mouth 2 (two) times daily with a meal.    Yes Historical Provider, MD  montelukast (SINGULAIR) 10 MG tablet Take 1 tablet (10 mg total) by mouth at bedtime. 07/27/12  Yes Mary-Margaret Daphine Deutscher, FNP  polyethylene glycol (MIRALAX / GLYCOLAX) packet Take 17 g by mouth daily as needed (for constipation).    Yes Historical Provider, MD  aspirin EC 81 MG tablet Take 324 mg by mouth once as needed for pain.    Historical Provider, MD    Social History:  reports that she quit smoking about 20 years ago. She has never used smokeless tobacco. She reports that she does not drink alcohol or use illicit drugs.  Family History  Problem Relation Age of Onset  . Prostate cancer Father   . Stomach cancer Maternal Uncle   . Diabetes Maternal Grandmother   . Heart disease Paternal Grandmother   . Irritable bowel syndrome      Review of Systems:  HEENT: Denies headache, blurred vision, runny nose, sore throat,  Neck: Denies thyroid problems,lymphadenopathy Chest : Denies shortness of breath, no history of COPD Heart : See history of present illness GI: Denies  nausea, vomiting, diarrhea, GU: Denies dysuria, urgency, frequency of urination, hematuria Neuro: Denies stroke, positive history of seizures, syncope    Physical Exam: Blood pressure 128/81, pulse 100, temperature 98.2 F (36.8 C), temperature source Oral, resp. rate 19, height 5\' 3"  (1.6 m), weight 74.844 kg (165 lb), SpO2 95.00%. Constitutional:   Patient is a well-developed and well-nourished female  in no acute distress and cooperative with exam. Head: Normocephalic and atraumatic Mouth: Mucus membranes  moist Throat: Pharyngeal mucosa is non-erythematous, no edema noted Eyes: PERRL, EOMI, conjunctivae normal Neck: Supple, No Thyromegaly Cardiovascular: RRR, S1 normal, S2 normal Pulmonary/Chest: CTAB, no wheezes, rales, or rhonchi Abdominal: Soft. Non-tender, non-distended, bowel sounds are normal, no masses, organomegaly, or guarding present.  Neurological: A&O x3, Strenght is normal and symmetric bilaterally, cranial nerve II-XII are grossly intact, no focal motor deficit, sensory intact to light touch bilaterally.  Extremities : No Cyanosis, Clubbing or Edema   Labs on Admission:  Results for orders placed during the hospital encounter of 08/03/12 (from the past 48 hour(s))  TROPONIN I     Status: None   Collection Time    08/03/12  5:57 PM      Result Value Range   Troponin I <0.30  <0.30 ng/mL  Comment:            Due to the release kinetics of cTnI,     a negative result within the first hours     of the onset of symptoms does not rule out     myocardial infarction with certainty.     If myocardial infarction is still suspected,     repeat the test at appropriate intervals.  CBC WITH DIFFERENTIAL     Status: None   Collection Time    08/03/12  5:57 PM      Result Value Range   WBC 8.3  4.0 - 10.5 K/uL   RBC 4.67  3.87 - 5.11 MIL/uL   Hemoglobin 13.1  12.0 - 15.0 g/dL   HCT 16.1  09.6 - 04.5 %   MCV 81.4  78.0 - 100.0 fL   MCH 28.1  26.0 - 34.0 pg   MCHC 34.5  30.0 - 36.0 g/dL   RDW 40.9  81.1 - 91.4 %   Platelets 190  150 - 400 K/uL   Neutrophils Relative % 68  43 - 77 %   Neutro Abs 5.6  1.7 - 7.7 K/uL   Lymphocytes Relative 23  12 - 46 %   Lymphs Abs 1.9  0.7 - 4.0 K/uL   Monocytes Relative 7  3 - 12 %   Monocytes Absolute 0.6  0.1 - 1.0 K/uL   Eosinophils Relative 1  0 - 5 %   Eosinophils Absolute 0.1  0.0 - 0.7 K/uL   Basophils Relative 0  0 - 1 %   Basophils Absolute 0.0  0.0 - 0.1 K/uL  BASIC METABOLIC PANEL     Status: Abnormal   Collection Time     08/03/12  5:57 PM      Result Value Range   Sodium 140  135 - 145 mEq/L   Potassium 3.4 (*) 3.5 - 5.1 mEq/L   Chloride 104  96 - 112 mEq/L   CO2 24  19 - 32 mEq/L   Glucose, Bld 123 (*) 70 - 99 mg/dL   BUN 8  6 - 23 mg/dL   Creatinine, Ser 7.82  0.50 - 1.10 mg/dL   Calcium 8.8  8.4 - 95.6 mg/dL   GFR calc non Af Amer 75 (*) >90 mL/min   GFR calc Af Amer 87 (*) >90 mL/min   Comment:            The eGFR has been calculated     using the CKD EPI equation.     This calculation has not been     validated in all clinical     situations.     eGFR's persistently     <90 mL/min signify     possible Chronic Kidney Disease.  D-DIMER, QUANTITATIVE     Status: None   Collection Time    08/03/12  6:40 PM      Result Value Range   D-Dimer, Quant <0.27  0.00 - 0.48 ug/mL-FEU   Comment:            AT THE INHOUSE ESTABLISHED CUTOFF     VALUE OF 0.48 ug/mL FEU,     THIS ASSAY HAS BEEN DOCUMENTED     IN THE LITERATURE TO HAVE     A SENSITIVITY AND NEGATIVE     PREDICTIVE VALUE OF AT LEAST     98 TO 99%.  THE TEST RESULT     SHOULD BE CORRELATED WITH  AN ASSESSMENT OF THE CLINICAL     PROBABILITY OF DVT / VTE.    Radiological Exams on Admission: Dg Chest Portable 1 View  08/03/2012   *RADIOLOGY REPORT*  Clinical Data: Chest pain  PORTABLE CHEST - 1 VIEW  Comparison: 07/25/2012  Findings: Midline trachea.  Normal heart size and mediastinal contours. No pleural effusion or pneumothorax.  Suspect minimal scarring or subsegmental atelectasis at the left lung base.  IMPRESSION: No acute cardiopulmonary disease.   Original Report Authenticated By: Jeronimo Greaves, M.D.    Assessment/Plan Active Problems:   GERD   HIATAL HERNIA   Chest pain   Constipation  Chest pain We'll admit the patient under observation and obtain 3 sets of cardiac enzymes. Patient's EKG only shows sinus tachycardia. Patient will be continued on aspirin.  GERD Patient has  a history of hiatal  Hernia, and will continue  on PPIs. We'll start her on Protonix 40 mg by mouth twice a day. Also patient complains of bloating and gas we'll start her on Maalox 30 mL's every 6 hours x4 doses.  Constipation Continue on Amitiza Also start on Dulcolax suppository for constipation  Acute bronchitis Resolved at this time, patient has completed 10 days of course of antibiotics. Will not repeat antibiotics at this time Chest x-ray shows no acute cardia pulmonary disease Continue when necessary albuterol for asthma Continue with singulair  and cetirizine  DVT prophylaxis Lovenox  Time Spent on Admission: 55 min  Leala Bryand S [facility] Pager: [phone] 08/03/2012, 8:38 PM

## 2012-08-03 NOTE — ED Notes (Signed)
Pt states that she can take morphine for pain and has had in past without problems

## 2012-08-03 NOTE — ED Notes (Signed)
On Zithromax for bronchitis currently.

## 2012-08-03 NOTE — ED Notes (Signed)
EDP made aware of pt's continued CP, awaiting for pain med orders

## 2012-08-03 NOTE — ED Notes (Signed)
Patient with c/o central and right chest pressure that started last night. Patient reports taking ASA and pain subsiding last night. Pain began again early this morning. Advised to come to ED by PCP.

## 2012-08-04 ENCOUNTER — Encounter (HOSPITAL_COMMUNITY): Payer: Self-pay | Admitting: *Deleted

## 2012-08-04 ENCOUNTER — Telehealth: Payer: Self-pay | Admitting: Family Medicine

## 2012-08-04 DIAGNOSIS — J209 Acute bronchitis, unspecified: Secondary | ICD-10-CM

## 2012-08-04 DIAGNOSIS — R079 Chest pain, unspecified: Secondary | ICD-10-CM | POA: Diagnosis not present

## 2012-08-04 DIAGNOSIS — J9801 Acute bronchospasm: Secondary | ICD-10-CM | POA: Diagnosis present

## 2012-08-04 DIAGNOSIS — F411 Generalized anxiety disorder: Secondary | ICD-10-CM | POA: Diagnosis not present

## 2012-08-04 LAB — COMPREHENSIVE METABOLIC PANEL
BUN: 8 mg/dL (ref 6–23)
Calcium: 8.3 mg/dL — ABNORMAL LOW (ref 8.4–10.5)
Creatinine, Ser: 0.89 mg/dL (ref 0.50–1.10)
GFR calc Af Amer: 84 mL/min — ABNORMAL LOW (ref >90)
GFR calc non Af Amer: 72 mL/min — ABNORMAL LOW (ref >90)
Glucose, Bld: 120 mg/dL — ABNORMAL HIGH (ref 70–99)
Sodium: 141 mEq/L (ref 135–145)
Total Protein: 6.2 g/dL (ref 6.0–8.3)

## 2012-08-04 LAB — CBC
HCT: 36 % (ref 36.0–46.0)
Hemoglobin: 11.8 g/dL — ABNORMAL LOW (ref 12.0–15.0)
MCH: 27.2 pg (ref 26.0–34.0)
MCHC: 32.8 g/dL (ref 30.0–36.0)
MCV: 82.9 fL (ref 78.0–100.0)

## 2012-08-04 LAB — TROPONIN I
Troponin I: <0.3 ng/mL (ref <0.30)
Troponin I: <0.3 ng/mL (ref <0.30)
Troponin I: <0.3 ng/mL (ref <0.30)

## 2012-08-04 MED ORDER — OXYCODONE-ACETAMINOPHEN 5-325 MG PO TABS
1.0000 | ORAL_TABLET | ORAL | Status: DC | PRN
  Administered 2012-08-04 (×2): 1 via ORAL
  Filled 2012-08-04 (×2): qty 1

## 2012-08-04 MED ORDER — FLUTICASONE-SALMETEROL 100-50 MCG/DOSE IN AEPB: 1.0000 | INHALATION_SPRAY | Freq: Two times a day (BID) | RESPIRATORY_TRACT | Status: DC

## 2012-08-04 NOTE — Discharge Summary (Signed)
Physician Discharge Summary  Teresa [name] ZOX:[id number] DOB: 10/15/1955 DOA: 08/03/2012  PCP: Redmond Baseman, MD  Admit date: 08/03/2012 Discharge date: 08/04/2012  Time spent: 25 minutes  Recommendations for Outpatient Follow-up:  1. Patient will follow up with her PCP in the next few weeks 2. She is being prescribed an Advair inhaler for presumed bronchospasm and suspected underlying COPD 3. She's been referred to Gen. surgery for evaluation of hiatal hernia  Discharge Diagnoses:  Active Problems:   GERD   HIATAL HERNIA   Chest pain   Constipation   Bronchospasm   Discharge Condition: Improved, being discharged home  Diet recommendation: Low-sodium  Filed Weights   08/03/12 1754 08/03/12 2130  Weight: 74.844 kg (165 lb) 75.6 kg (166 lb 10.7 oz)    History of present illness:  On 45/70:57 year old female who came to the hospital today for chest pain, patient was diagnosed with bronchitis last week and was prescribed amoxicillin. Patient has taken amoxicillin offer more than 10 days. She still complains of cough. Patient does have a history of hiatal hernia and GERD and has been taking Dexilant, she does complain of bloating and constipation. She has been taking Amitiza for constipation. Patient does not have history of heart problems. She does not smoke cigarettes, though she does have significant family history of cardiac problems.  Today she denies nausea vomiting or diarrhea. Does contain of constipation and passing gas. Denies shortness of breath, does complain of discomfort in throat associated with chest pain.   Hospital Course:  Active Problems:   GERD: Continue PPI. Exacerbated by hiatal hernia. See below.    HIATAL HERNIA: Referral to Gen. surgery made. See below.    Chest pain: Principal problem. EKG and enzymes x3 were normal. Pain was atypical. Patient history of hiatal hernia and it's possible this could be from severe GERD. She's been on PPI for some time so  she got referral to general surgery as outpatient. Also to be bronchospasm. Patient has a recent course of bronchitis has been told the past she has chronic bronchitis. She states that she is on an albuterol inhaler when necessary but in the last few months has had to use it more. Suspect this is because of summer weather and increased pollen. Have started her on Advair scheduled twice a day plus continue her when necessary inhaler. There may possibly be a mild component of drug-seeking. Patient has listed in her past mental history of substance abuse issues. Tissue to be very focused on getting Percocet and asked for prescription for this pain when explained that this pain was not one that could be well treated with narcotics, she asked for that prescription for her back pain. I told her she needs to get this from her primary care physician.   Constipation: Stable.    Bronchospasm: See above.    Procedures:  None  Consultations:  None  Discharge Exam: Filed Vitals:   08/03/12 2107 08/03/12 2130 08/04/12 0622 08/04/12 0958  BP: 121/75 114/81 90/52 107/72  Pulse: 98 97 79 95  Temp:  97.8 F (36.6 C) 97.7 F (36.5 C)   TempSrc:  Oral    Resp: 17 18 16    Height:  5\' 3"  (1.6 m)    Weight:  75.6 kg (166 lb 10.7 oz)    SpO2: 95% 94% 95% 96%    General: Alert and oriented x3, no acute distress Cardiovascular: Regular rate and rhythm, S1-S2 Respiratory: Clear to auscultation bilaterally  Discharge Instructions  Discharge Orders  Future Appointments Provider Department Dept Phone   12/05/2012 9:00 AM Ileana Ladd, MD [facility] [phone]   Future Orders Complete By Expires     Diet - low sodium heart healthy  As directed     Increase activity slowly  As directed         Medication List    STOP taking these medications       amoxicillin 875 MG tablet  Commonly known as:  AMOXIL      TAKE these medications       aspirin EC 81 MG tablet  Take  324 mg by mouth once as needed for pain.     benzonatate 200 MG capsule  Commonly known as:  TESSALON  Take 1 capsule (200 mg total) by mouth 2 (two) times daily as needed for cough.     celecoxib 200 MG capsule  Commonly known as:  CELEBREX  Take 200 mg by mouth daily.     cetirizine 10 MG tablet  Commonly known as:  ZYRTEC  Take 10 mg by mouth every morning.     cyclobenzaprine 10 MG tablet  Commonly known as:  FLEXERIL  Take 10 mg by mouth 3 (three) times daily as needed. spasms     DEXILANT 60 MG capsule  Generic drug:  dexlansoprazole  Take 60 mg by mouth daily.     Fiber Complete Tabs  Take 1 tablet by mouth daily.     Fluticasone-Salmeterol 100-50 MCG/DOSE Aepb  Commonly known as:  ADVAIR DISKUS  Inhale 1 puff into the lungs 2 (two) times daily.     LORazepam 1 MG tablet  Commonly known as:  ATIVAN  Take 1 tablet (1 mg total) by mouth 2 (two) times daily as needed for anxiety.     lubiprostone 8 MCG capsule  Commonly known as:  AMITIZA  Take 8 mcg by mouth 2 (two) times daily with a meal.     montelukast 10 MG tablet  Commonly known as:  SINGULAIR  Take 1 tablet (10 mg total) by mouth at bedtime.     polyethylene glycol packet  Commonly known as:  MIRALAX / GLYCOLAX  Take 17 g by mouth daily as needed (for constipation).     PROAIR HFA 108 (90 BASE) MCG/ACT inhaler  Generic drug:  albuterol  Inhale 2 puffs into the lungs every 6 (six) hours as needed. Wheezing and shortness of breath       Allergies  Allergen Reactions  . Arthrotec (Diclofenac-Misoprostol)   . Naproxen     Irritates stomach  . Prozac (Fluoxetine Hcl)     "makes me feel bad"  . Vicodin (Hydrocodone-Acetaminophen)   . Ambien (Zolpidem Tartrate) Other (See Comments)    COULDN'T FUNCTION, STAYED SLEEPY  . Gabapentin Other (See Comments)    COULDN'T FUNCTION, STAYED SLEEPY       Follow-up Information   Follow up with Redmond Baseman, MD In 2 weeks.   Contact information:    [address] [place] [address] [phone]       Schedule an appointment as soon as possible for a visit with CCS,MD, MD. (Evaluation for hital hernia repair)    Contact information:   [address] [place] [address] [phone]        The results of significant diagnostics from this hospitalization (including imaging, microbiology, ancillary and laboratory) are listed below for reference.    Significant Diagnostic Studies: Dg Chest 2 View  07/26/2012   .  IMPRESSION: Mild lung hyperexpansion and bronchitic change without acute cardiopulmonary disease.  Clinically significant discrepancy from primary report, if provided: None   Original Report Authenticated By: Tacey Ruiz, MD   Dg Chest Portable 1 View  08/03/2012    IMPRESSION: No acute cardiopulmonary disease.   Original Report Authenticated By: Jeronimo Greaves, M.D.    Microbiology: No results found for this or any previous visit (from the past 240 hour(s)).   Labs: Basic Metabolic Panel:  Recent Labs Lab 08/03/12 1757 08/04/12 0548  NA 140 141  K 3.4* 4.0  CL 104 105  CO2 24 29  GLUCOSE 123* 120*  BUN 8 8  CREATININE 0.86 0.89  CALCIUM 8.8 8.3*   Liver Function Tests:  Recent Labs Lab 08/04/12 0548  AST 26  ALT 32  ALKPHOS 91  BILITOT 0.6  PROT 6.2  ALBUMIN 3.2*   CBC:  Recent Labs Lab 08/03/12 1757 08/04/12 0548  WBC 8.3 6.1  NEUTROABS 5.6  --   HGB 13.1 11.8*  HCT 38.0 36.0  MCV 81.4 82.9  PLT 190 163   Cardiac Enzymes:  Recent Labs Lab 08/03/12 1757 08/03/12 2341 08/04/12 0548 08/04/12 1129  TROPONINI <0.30 <0.30 <0.30 <0.30      Signed:  Virginia Rochester K  [facility] 08/04/2012, 2:17 PM

## 2012-08-04 NOTE — Progress Notes (Signed)
IV removed, site WNL.  Pt given d/c instructions, new prescription was called into pt's pharmacy, pt made aware.  Discussed home care with patient and discussed home medications, patient verbalizes understanding, teachback completed. F/U appointment in place, pt states they will keep appointment. Pt is stable at this time. Pt taken to main entrance in wheelchair by staff member.

## 2012-08-04 NOTE — Progress Notes (Signed)
Utilization Review Complete  

## 2012-08-07 NOTE — Telephone Encounter (Signed)
CALLED PT AND SHE STATED SHE DID NOT NEED PERCOCET AT THIS TIME.

## 2012-08-17 ENCOUNTER — Encounter: Payer: Self-pay | Admitting: Family Medicine

## 2012-08-17 ENCOUNTER — Ambulatory Visit (INDEPENDENT_AMBULATORY_CARE_PROVIDER_SITE_OTHER): Payer: Medicare Other | Admitting: Family Medicine

## 2012-08-17 DIAGNOSIS — L309 Dermatitis, unspecified: Secondary | ICD-10-CM | POA: Insufficient documentation

## 2012-08-17 DIAGNOSIS — IMO0002 Reserved for concepts with insufficient information to code with codable children: Secondary | ICD-10-CM | POA: Diagnosis not present

## 2012-08-17 DIAGNOSIS — L259 Unspecified contact dermatitis, unspecified cause: Secondary | ICD-10-CM

## 2012-08-17 DIAGNOSIS — F1921 Other psychoactive substance dependence, in remission: Secondary | ICD-10-CM

## 2012-08-17 DIAGNOSIS — K589 Irritable bowel syndrome without diarrhea: Secondary | ICD-10-CM | POA: Diagnosis not present

## 2012-08-17 DIAGNOSIS — K219 Gastro-esophageal reflux disease without esophagitis: Secondary | ICD-10-CM | POA: Diagnosis not present

## 2012-08-17 DIAGNOSIS — M1710 Unilateral primary osteoarthritis, unspecified knee: Secondary | ICD-10-CM

## 2012-08-17 MED ORDER — TRIAMCINOLONE ACETONIDE 0.025 % EX OINT: TOPICAL_OINTMENT | Freq: Two times a day (BID) | CUTANEOUS | Status: DC

## 2012-08-17 NOTE — Progress Notes (Signed)
Patient ID: Teresa [name], female   DOB: February 05, 1956, 57 y.o.   MRN: [id number] SUBJECTIVE: CC: Chief Complaint  Patient presents with  . Hospitalization Follow-up    overnight stay at [facility] .she states she needs referral to GI for hiatal hernia    HPI: 1. Hiatal hernia: was referred General surgeon but they said  She needed a GI evaluation first. A lot of heart burn. Was admitted at [facility] overnight. MI ruled out enzyme negative. Reflux with BRASH.   PMH/PSH: reviewed/updated in Epic  SH/FH: reviewed/updated in Epic  Allergies: reviewed/updated in Epic  Medications: reviewed/updated in Epic  Immunizations: reviewed/updated in Epic  ROS: As above in the HPI. All other systems are stable or negative.  OBJECTIVE: APPEARANCE:  Patient in no acute distress.The patient appeared well nourished and normally developed. Acyanotic. Waist: VITAL SIGNS: WF NAD  SKIN: warm and  Dry. Small exzematous lesions on the forearms.  HEAD and Neck: without JVD, Head and scalp: normal Eyes:No scleral icterus. Fundi normal, eye movements normal. Ears: Auricle normal, canal normal, Tympanic membranes normal, insufflation normal. Nose: normal Throat: normal Neck & thyroid: normal  CHEST & LUNGS: Chest wall: normal Lungs: Clear  CVS: Reveals the PMI to be normally located. Regular rhythm, First and Second Heart sounds are normal,  absence of murmurs, rubs or gallops. Peripheral vasculature: Radial pulses: normal Dorsal pedis pulses: normal Posterior pulses: normal  ABDOMEN:  Appearance:obeseBenign, no organomegaly, no masses, no Abdominal Aortic enlargement. No Guarding , no rebound. No Bruits. Bowel sounds: normal  RECTAL: N/A GU: N/A  Musculoskeletal: Reduced ROM of spine.  NEUROLOGIC: oriented to time,place and person; nonfocal in extremities.  ASSESSMENT: GERD (gastroesophageal reflux disease) - Plan: Ambulatory referral to Gastroenterology  Eczema - Plan: triamcinolone  (KENALOG) 0.025 % ointment  Irritable bowel syndrome  DEGENERATIVE JOINT DISEASE, KNEES, BILATERAL  DEGENERATIVE DISC DISEASE  DRUG ABUSE, IN REMISSION  GERD   PLAN: Orders Placed This Encounter  Procedures  . Ambulatory referral to Gastroenterology    Referral Priority:  Routine    Referral Type:  Consultation    Referral Reason:  Specialty Services Required    Requested Specialty:  Gastroenterology    Number of Visits Requested:  1   Results for orders placed during the hospital encounter of 08/03/12  TROPONIN I      Result Value Range   Troponin I <0.30  <0.30 ng/mL  CBC WITH DIFFERENTIAL      Result Value Range   WBC 8.3  4.0 - 10.5 K/uL   RBC 4.67  3.87 - 5.11 MIL/uL   Hemoglobin 13.1  12.0 - 15.0 g/dL   HCT 40.9  81.1 - 91.4 %   MCV 81.4  78.0 - 100.0 fL   MCH 28.1  26.0 - 34.0 pg   MCHC 34.5  30.0 - 36.0 g/dL   RDW 78.2  95.6 - 21.3 %   Platelets 190  150 - 400 K/uL   Neutrophils Relative % 68  43 - 77 %   Neutro Abs 5.6  1.7 - 7.7 K/uL   Lymphocytes Relative 23  12 - 46 %   Lymphs Abs 1.9  0.7 - 4.0 K/uL   Monocytes Relative 7  3 - 12 %   Monocytes Absolute 0.6  0.1 - 1.0 K/uL   Eosinophils Relative 1  0 - 5 %   Eosinophils Absolute 0.1  0.0 - 0.7 K/uL   Basophils Relative 0  0 - 1 %   Basophils Absolute 0.0  0.0 - 0.1 K/uL  BASIC METABOLIC PANEL      Result Value Range   Sodium 140  135 - 145 mEq/L   Potassium 3.4 (*) 3.5 - 5.1 mEq/L   Chloride 104  96 - 112 mEq/L   CO2 24  19 - 32 mEq/L   Glucose, Bld 123 (*) 70 - 99 mg/dL   BUN 8  6 - 23 mg/dL   Creatinine, Ser 1.61  0.50 - 1.10 mg/dL   Calcium 8.8  8.4 - 09.6 mg/dL   GFR calc non Af Amer 75 (*) >90 mL/min   GFR calc Af Amer 87 (*) >90 mL/min  D-DIMER, QUANTITATIVE      Result Value Range   D-Dimer, Quant <0.27  0.00 - 0.48 ug/mL-FEU  CBC      Result Value Range   WBC 6.1  4.0 - 10.5 K/uL   RBC 4.34  3.87 - 5.11 MIL/uL   Hemoglobin 11.8 (*) 12.0 - 15.0 g/dL   HCT 04.5  40.9 - 81.1 %    MCV 82.9  78.0 - 100.0 fL   MCH 27.2  26.0 - 34.0 pg   MCHC 32.8  30.0 - 36.0 g/dL   RDW 91.4  78.2 - 95.6 %   Platelets 163  150 - 400 K/uL  COMPREHENSIVE METABOLIC PANEL      Result Value Range   Sodium 141  135 - 145 mEq/L   Potassium 4.0  3.5 - 5.1 mEq/L   Chloride 105  96 - 112 mEq/L   CO2 29  19 - 32 mEq/L   Glucose, Bld 120 (*) 70 - 99 mg/dL   BUN 8  6 - 23 mg/dL   Creatinine, Ser 2.13  0.50 - 1.10 mg/dL   Calcium 8.3 (*) 8.4 - 10.5 mg/dL   Total Protein 6.2  6.0 - 8.3 g/dL   Albumin 3.2 (*) 3.5 - 5.2 g/dL   AST 26  0 - 37 U/L   ALT 32  0 - 35 U/L   Alkaline Phosphatase 91  39 - 117 U/L   Total Bilirubin 0.6  0.3 - 1.2 mg/dL   GFR calc non Af Amer 72 (*) >90 mL/min   GFR calc Af Amer 84 (*) >90 mL/min  TROPONIN I      Result Value Range   Troponin I <0.30  <0.30 ng/mL  TROPONIN I      Result Value Range   Troponin I <0.30  <0.30 ng/mL  TROPONIN I      Result Value Range   Troponin I <0.30  <0.30 ng/mL   Meds ordered this encounter  Medications  . triamcinolone (KENALOG) 0.025 % ointment    Sig: Apply topically 2 (two) times daily. For 10 days    Dispense:  30 g    Refill:  0   Return in about 3 months (around 11/17/2012) for Recheck medical problems.  Terique Kawabata P. Modesto Charon, M.D.

## 2012-08-30 ENCOUNTER — Ambulatory Visit: Payer: Medicare Other | Admitting: Internal Medicine

## 2012-09-06 ENCOUNTER — Encounter: Payer: Self-pay | Admitting: Internal Medicine

## 2012-09-06 ENCOUNTER — Ambulatory Visit (INDEPENDENT_AMBULATORY_CARE_PROVIDER_SITE_OTHER): Payer: Medicare Other | Admitting: Internal Medicine

## 2012-09-06 VITALS — BP 124/84 | HR 104 | Ht 62.0 in | Wt 166.1 lb

## 2012-09-06 DIAGNOSIS — K222 Esophageal obstruction: Secondary | ICD-10-CM | POA: Diagnosis not present

## 2012-09-06 DIAGNOSIS — K589 Irritable bowel syndrome without diarrhea: Secondary | ICD-10-CM | POA: Diagnosis not present

## 2012-09-06 DIAGNOSIS — R1310 Dysphagia, unspecified: Secondary | ICD-10-CM

## 2012-09-06 DIAGNOSIS — K219 Gastro-esophageal reflux disease without esophagitis: Secondary | ICD-10-CM

## 2012-09-06 NOTE — Patient Instructions (Addendum)

## 2012-09-06 NOTE — Progress Notes (Signed)
HISTORY OF PRESENT ILLNESS:  Teresa [name] is a 57 y.o. female with a history of anxiety/depression, alcohol and drug abuse, GERD complicated by peptic stricture, irritable bowel syndrome, chronic back pain, chronic abdominal pain, multiple prior surgeries including cholecystectomy and hysterectomy. She has been evaluated in this office on several occasions for abdominal pain (most recently August 2012). She presents today, at the request of her primary provider, regarding problems with chest pain and worsening reflux. The patient was hospitalized for 2 days in early June for evaluation of chest pain and shortness of breath. She underwent extensive workup which was essentially unremarkable. She was given the presumptive diagnosis of bronchospasm and GERD. They wanted to refer her to general surgery, for "her hiatal hernia". Patient tells me that she has had worsening reflux symptoms, including regurgitation and pyrosis, over the past month despite excellent. She continues with chronic constipation for which she takes fiber, Amitiza, and MiraLax. She does move her bowels daily. Her GI review of systems is remarkable for nausea, weight gain, dysphagia to solids, bloating, abdominal discomfort, and chronic constipation. Patient underwent colonoscopy in 2000 and again in 2007. The examinations were normal. Her last upper endoscopy, for complaints of dysphagia was performed may 2007. He was found to have a distal esophageal stricture which was dilated with a 54 [place] Maloney dilator. A hiatal hernia was present. Otherwise normal exam. Recent laboratories and x-rays have been reviewed. As well, recent hospital summary and PCP office visits reviewed.  REVIEW OF SYSTEMS:  All non-GI ROS negative except for sinus and allergy trouble, anxiety, arthritis, back pain, visual change, confusion, cough, depression, fatigue, headaches, heart rhythm change, itching, muscle cramps, muscle pains, shortness of breath, sleeping  problems, excessive thirst, urinary leakage  Past Medical History  Diagnosis Date  . Diverticulosis 07-08-2005    colonoscopy  . Hiatal hernia 07-08-2005    EGD  . GERD (gastroesophageal reflux disease) 07-08-2005    EGD  . Irritable bowel syndrome   . Vocal cord polyp     history of  . Degeneration of intervertebral disc, site unspecified   . Osteoarthrosis, unspecified whether generalized or localized, lower leg   . Depressive disorder, not elsewhere classified   . Anxiety state, unspecified   . Full dentures   . Seizures 1990s    x 1, unknown cause; no seizures since  . Chronic back pain greater than 3 months duration   . Seasonal allergies   . Papilloma of breast 09/2011    left  . Asthma     prn inhaler  . Esophageal stricture     Past Surgical History  Procedure Laterality Date  . Cholecystectomy    . Bladder surgery      bladder tack  . 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 Cystoscopy  08/24/2011    Procedure: CYSTOSCOPY;  Surgeon: Martina Sinner, MD;  Location: [facility];  Service: Urology;  Laterality: N/A;  Cystoscopy,  Rectocele Repair and Vault Prolapse Repair  . Rectocele repair  08/24/2011    Procedure: POSTERIOR REPAIR (RECTOCELE);  Surgeon: Martina Sinner, MD;  Location: [facility];  Service: Urology;  Laterality: N/A;  . Vaginal prolapse repair  08/24/2011    Procedure: VAGINAL VAULT SUSPENSION;  Surgeon: Martina Sinner, MD;  Location: [facility];  Service: Urology;  Laterality: N/A;  . Abdominal hysterectomy      partial  . Bilateral salpingoophorectomy    . Other surgical history      exc. vocal  cord polyp  . Breast biopsy  11/03/2011    Procedure: BREAST BIOPSY WITH NEEDLE LOCALIZATION;  Surgeon: Shelly Rubenstein, MD;  Location: [facility];  Service: General;  Laterality: Left;  needle localized left breast biopsy    Social History CHERALYN OLIVER  reports that she quit smoking about 21 years ago. She has never used smokeless tobacco.  She reports that she does not drink alcohol or use illicit drugs.  family history includes Diabetes in her maternal grandmother; Heart disease in her paternal grandmother; Irritable bowel syndrome in an unspecified family member; Prostate cancer in her father; and Stomach cancer in her maternal uncle.  Allergies  Allergen Reactions  . Arthrotec (Diclofenac-Misoprostol)   . Naproxen     Irritates stomach  . Prozac (Fluoxetine Hcl)     "makes me feel bad"  . Vicodin (Hydrocodone-Acetaminophen)   . Ambien (Zolpidem Tartrate) Other (See Comments)    COULDN'T FUNCTION, STAYED SLEEPY  . Gabapentin Other (See Comments)    COULDN'T FUNCTION, STAYED SLEEPY       PHYSICAL EXAMINATION: Vital signs: BP 124/84  Pulse 104  Ht 5\' 2"  (1.575 m)  Wt 166 lb 2 oz (75.354 kg)  BMI 30.38 kg/m2 General: Well-developed, well-nourished, no acute distress HEENT: Sclerae are anicteric, conjunctiva pink. Oral mucosa intact Lungs: Clear Heart: Regular Abdomen: soft, mild epigastric tenderness to palpation, nondistended, no obvious ascites, no peritoneal signs, normal bowel sounds. No organomegaly. Extremities: No edema Psychiatric: alert and oriented x3. Cooperative   ASSESSMENT:  #1. GERD. Seemingly exacerbated recently #2. Recurrent dysphagia. Likely due to peptic stricture #3. Recent problems with chest pain and shortness of breath. Etiology unclear to me. Hospital workup as noted #4. Constipation predominant IBS. Ongoing #5. Multiple general medical problems   PLAN:  #1. Reflux precautions #2. Continue Dexilant #3. Upper endoscopy with possible esophageal dilation.The nature of the procedure, as well as the risks, benefits, and alternatives were carefully and thoroughly reviewed with the patient. Ample time for discussion and questions allowed. The patient understood, was satisfied, and agreed to proceed. #4. Continue with agents to help constipation predominant IBS #5. Routine repeat  screening colonoscopy around May 2017 #6. Ongoing general medical care with Dr. Modesto Charon

## 2012-09-19 ENCOUNTER — Encounter: Payer: Medicare Other | Admitting: Internal Medicine

## 2012-09-25 DIAGNOSIS — G5600 Carpal tunnel syndrome, unspecified upper limb: Secondary | ICD-10-CM | POA: Diagnosis not present

## 2012-09-25 DIAGNOSIS — M545 Low back pain: Secondary | ICD-10-CM | POA: Diagnosis not present

## 2012-09-25 DIAGNOSIS — IMO0002 Reserved for concepts with insufficient information to code with codable children: Secondary | ICD-10-CM | POA: Diagnosis not present

## 2012-09-25 DIAGNOSIS — G894 Chronic pain syndrome: Secondary | ICD-10-CM | POA: Diagnosis not present

## 2012-10-03 ENCOUNTER — Ambulatory Visit (AMBULATORY_SURGERY_CENTER): Payer: Medicare Other | Admitting: Internal Medicine

## 2012-10-03 ENCOUNTER — Encounter: Payer: Self-pay | Admitting: Internal Medicine

## 2012-10-03 ENCOUNTER — Other Ambulatory Visit: Payer: Self-pay | Admitting: *Deleted

## 2012-10-03 VITALS — BP 112/55 | HR 93 | Temp 97.3°F | Resp 23 | Ht 62.0 in | Wt 166.0 lb

## 2012-10-03 DIAGNOSIS — K222 Esophageal obstruction: Secondary | ICD-10-CM

## 2012-10-03 DIAGNOSIS — R1310 Dysphagia, unspecified: Secondary | ICD-10-CM

## 2012-10-03 DIAGNOSIS — K219 Gastro-esophageal reflux disease without esophagitis: Secondary | ICD-10-CM

## 2012-10-03 HISTORY — PX: ESOPHAGOGASTRODUODENOSCOPY: SHX1529

## 2012-10-03 MED ORDER — FLUTICASONE-SALMETEROL 100-50 MCG/DOSE IN AEPB: 1.0000 | INHALATION_SPRAY | Freq: Two times a day (BID) | RESPIRATORY_TRACT | Status: DC

## 2012-10-03 MED ORDER — CETIRIZINE HCL 10 MG PO TABS: 10.0000 mg | ORAL_TABLET | Freq: Every morning | ORAL | Status: DC

## 2012-10-03 MED ORDER — LUBIPROSTONE 8 MCG PO CAPS: 8.0000 ug | ORAL_CAPSULE | Freq: Two times a day (BID) | ORAL | Status: DC

## 2012-10-03 MED ORDER — DEXLANSOPRAZOLE 60 MG PO CPDR: 60.0000 mg | DELAYED_RELEASE_CAPSULE | Freq: Every day | ORAL | Status: DC

## 2012-10-03 MED ORDER — SODIUM CHLORIDE 0.9 % IV SOLN: 500.0000 mL | INTRAVENOUS | Status: DC

## 2012-10-03 NOTE — Telephone Encounter (Signed)
Tried to refill this med and it says that pts rx plan will not cover and gave me alternatives. Please review

## 2012-10-03 NOTE — Progress Notes (Signed)
Called to room to assist during endoscopic procedure.  Patient ID and intended procedure confirmed with present staff. Received instructions for my participation in the procedure from the performing physician.  

## 2012-10-03 NOTE — Op Note (Signed)
[facility] [address] [place], [address]   ENDOSCOPY PROCEDURE REPORT  PATIENT: Teresa, [name]  MR#: [id number] BIRTHDATE: 10-24-1955 , 56  yrs. old GENDER: Female ENDOSCOPIST: Roxy Cedar, MD REFERRED BY:  .  Self / Office PROCEDURE DATE:  10/03/2012 PROCEDURE:  EGD, diagnostic and Maloney dilation of esophagus  - 29 F ASA CLASS:     Class II INDICATIONS:  Dysphagia.   History of esophageal reflux. MEDICATIONS: MAC sedation, administered by CRNA and propofol (Diprivan) 150mg  IV TOPICAL ANESTHETIC: none  DESCRIPTION OF PROCEDURE: After the risks benefits and alternatives of the procedure were thoroughly explained, informed consent was obtained.  The [id number] endoscope was introduced through the mouth and advanced to the second portion of the duodenum. Without limitations.  The instrument was slowly withdrawn as the mucosa was fully examined.     EXAM: The upper, middle and distal third of the esophagus were carefully inspected and no abnormalities were noted except for large caliber distal stricture.  The z-line was well seen at the GEJ.  The endoscope was pushed into the fundus which was normal including a retroflexed view.  The antrum, gastric body, first and second part of the duodenum were unremarkable.  Retroflexed views revealed no abnormalities.     The scope was then withdrawn from the patient and the procedure completed.  THERAPY: 56 F MALONEY DILATOR PASSED WITHOUT RESISTANCE OR HEME. TOLERATED WELL  COMPLICATIONS: There were no complications. ENDOSCOPIC IMPRESSION: 1. Stricture of esophagus s/p dilation 23F 2. Otherwise Normal EGD  RECOMMENDATIONS: 1.  Clear liquids until 11 am, then soft foods rest of day.  Resume prior diet tomorrow. 2.  Continue PPI  REPEAT EXAM:  eSigned:  Roxy Cedar, MD 10/03/2012 9:36 AM   FA:OZHY, Scarlette Calico MD and The Patient

## 2012-10-03 NOTE — Progress Notes (Signed)
Patient did not have preoperative order for IV antibiotic SSI prophylaxis. (G8918)  Patient did not experience any of the following events: a burn prior to discharge; a fall within the facility; wrong site/side/patient/procedure/implant event; or a hospital transfer or hospital admission upon discharge from the facility. (G8907)  

## 2012-10-03 NOTE — Patient Instructions (Addendum)
YOU HAD AN ENDOSCOPIC PROCEDURE TODAY AT THE [facility]: Refer to the procedure report that was given to you for any specific questions about what was found during the examination.  If the procedure report does not answer your questions, please call your gastroenterologist to clarify.  If you requested that your care partner not be given the details of your procedure findings, then the procedure report has been included in a sealed envelope for you to review at your convenience later.  YOU SHOULD EXPECT: Some feelings of bloating in the abdomen. Passage of more gas than usual.  Walking can help get rid of the air that was put into your GI tract during the procedure and reduce the bloating. If you had a lower endoscopy (such as a colonoscopy or flexible sigmoidoscopy) you may notice spotting of blood in your stool or on the toilet paper. If you underwent a bowel prep for your procedure, then you may not have a normal bowel movement for a few days.  DIET: Your first meal following the procedure should be a light meal and then it is ok to progress to your normal diet.  A half-sandwich or bowl of soup is an example of a good first meal.  Heavy or fried foods are harder to digest and may make you feel nauseous or bloated.  Likewise meals heavy in dairy and vegetables can cause extra gas to form and this can also increase the bloating.  Drink plenty of fluids but you should avoid alcoholic beverages for 24 hours.  ACTIVITY: Your care partner should take you home directly after the procedure.  You should plan to take it easy, moving slowly for the rest of the day.  You can resume normal activity the day after the procedure however you should NOT DRIVE or use heavy machinery for 24 hours (because of the sedation medicines used during the test).    SYMPTOMS TO REPORT IMMEDIATELY: A gastroenterologist can be reached at any hour.  During normal business hours, 8:30 AM to 5:00 PM Monday through Friday,  call [phone].  After hours and on weekends, please call the GI answering service at [phone] who will take a message and have the physician on call contact you.   Following upper endoscopy (EGD)  Vomiting of blood or coffee ground material  New chest pain or pain under the shoulder blades  Painful or persistently difficult swallowing  New shortness of breath  Fever of 100F or higher  Black, tarry-looking stools  FOLLOW UP: If any biopsies were taken you will be contacted by phone or by letter within the next 1-3 weeks.  Call your gastroenterologist if you have not heard about the biopsies in 3 weeks.  Our staff will call the home number listed on your records the next business day following your procedure to check on you and address any questions or concerns that you may have at that time regarding the information given to you following your procedure. This is a courtesy call and so if there is no answer at the home number and we have not heard from you through the emergency physician on call, we will assume that you have returned to your regular daily activities without incident.  SIGNATURES/CONFIDENTIALITY: You and/or your care partner have signed paperwork which will be entered into your electronic medical record.  These signatures attest to the fact that that the information above on your After Visit Summary has been reviewed and is understood.  Full responsibility   of the confidentiality of this discharge information lies with you and/or your care-partner.  Recommendations See procedure report 

## 2012-10-04 ENCOUNTER — Telehealth: Payer: Self-pay | Admitting: *Deleted

## 2012-10-04 NOTE — Telephone Encounter (Signed)
No answer, left message to call if questions or concerns. 

## 2012-10-13 ENCOUNTER — Encounter: Payer: Self-pay | Admitting: Family Medicine

## 2012-10-13 ENCOUNTER — Ambulatory Visit (INDEPENDENT_AMBULATORY_CARE_PROVIDER_SITE_OTHER): Payer: Medicare Other | Admitting: Family Medicine

## 2012-10-13 VITALS — BP 109/74 | HR 103 | Temp 97.6°F | Ht 62.0 in | Wt 165.0 lb

## 2012-10-13 DIAGNOSIS — H029 Unspecified disorder of eyelid: Secondary | ICD-10-CM

## 2012-10-13 NOTE — Progress Notes (Signed)
  Subjective:    Patient ID: Teresa [name], female    DOB: 10-May-1955, 57 y.o.   MRN: [id number]  HPI HPI  This patient complains of a RASH  Location: Face, R nasolabial fold area   Onset: 2 months   Course: noticed lesion. Mildy itchy and painful. Has been scratching area intermittently. Some bleeding.   Self-treated with: nothing  Improvement with treatment: n/a  History  Itching: yes  Tenderness: minimal-mild  New medications/antibiotics: no  Pet exposure: no  Recent travel or tropical exposure: no  New soaps, shampoos, detergent, clothing: no  Tick/insect exposure: no  Chemical Exposure: no  Red Flags  Feeling ill: no  Fever: no  Facial/tongue swelling/difficulty breathing: no  Diabetic or immunocompromised: no      Review of Systems  All other systems reviewed and are negative.       Objective:   Physical Exam  Constitutional: She appears well-developed and well-nourished.  HENT:  Head: Normocephalic and atraumatic.  Nose:    Eyes: Conjunctivae are normal. Pupils are equal, round, and reactive to light.  Neck: Normal range of motion.  Cardiovascular: Normal rate and regular rhythm.   Pulmonary/Chest: Effort normal.  Abdominal: Soft.  Musculoskeletal: Normal range of motion.  Neurological: She is alert.  Skin: Rash noted.          Assessment & Plan:  Differential diagnosis includes infectious, inflammatory, neoplastic sources. Given distribution of rash/lesion as well as patient's past medical history will refer patient to dermatology for further evaluation. Discussed plan with patient. Discussed general term flags or warrant emergent evaluation including bleeding, purulent drainage. Patient expressed understanding of plan.

## 2012-10-24 ENCOUNTER — Telehealth: Payer: Self-pay | Admitting: Family Medicine

## 2012-10-24 NOTE — Telephone Encounter (Signed)
APPT MADE

## 2012-10-27 ENCOUNTER — Encounter: Payer: Self-pay | Admitting: General Practice

## 2012-10-27 ENCOUNTER — Ambulatory Visit (INDEPENDENT_AMBULATORY_CARE_PROVIDER_SITE_OTHER): Payer: Medicare Other | Admitting: General Practice

## 2012-10-27 VITALS — BP 127/75 | HR 93 | Temp 97.8°F | Ht 62.0 in | Wt 163.0 lb

## 2012-10-27 DIAGNOSIS — L089 Local infection of the skin and subcutaneous tissue, unspecified: Secondary | ICD-10-CM | POA: Diagnosis not present

## 2012-10-27 MED ORDER — CEPHALEXIN 500 MG PO CAPS: 500.0000 mg | ORAL_CAPSULE | Freq: Three times a day (TID) | ORAL | Status: DC

## 2012-10-27 MED ORDER — CEPHALEXIN 500 MG PO CAPS: 500.0000 mg | ORAL_CAPSULE | Freq: Two times a day (BID) | ORAL | Status: DC

## 2012-10-27 NOTE — Patient Instructions (Signed)
Skin Infections  A skin infection usually develops as a result of disruption of the skin barrier.   CAUSES   A skin infection might occur following:   Trauma or an injury to the skin such as a cut or insect sting.   Inflammation (as in eczema).   Breaks in the skin between the toes (as in athlete's foot).   Swelling (edema).  SYMPTOMS   The legs are the most common site affected. Usually there is:   Redness.   Swelling.   Pain.   There may be red streaks in the area of the infection.  TREATMENT    Minor skin infections may be treated with topical antibiotics, but if the skin infection is severe, hospital care and intravenous (IV) antibiotic treatment may be needed.   Most often skin infections can be treated with oral antibiotic medicine as well as proper rest and elevation of the affected area until the infection improves.   If you are prescribed oral antibiotics, it is important to take them as directed and to take all the pills even if you feel better before you have finished all of the medicine.   You may apply warm compresses to the area for 20-30 minutes 4 times daily.  You might need a tetanus shot now if:   You have no idea when you had the last one.   You have never had a tetanus shot before.   Your wound had dirt in it.  If you need a tetanus shot and you decide not to get one, there is a rare chance of getting tetanus. Sickness from tetanus can be serious. If you get a tetanus shot, your arm may swell and become red and warm at the shot site. This is common and not a problem.  SEEK MEDICAL CARE IF:   The pain and swelling from your infection do not improve within 2 days.   SEEK IMMEDIATE MEDICAL CARE IF:   You develop a fever, chills, or other serious problems.   Document Released: 03/25/2004 Document Revised: 05/10/2011 Document Reviewed: 02/05/2008  Exit[employer] Patient Information 2014 [employer].

## 2012-10-27 NOTE — Progress Notes (Signed)
  Subjective:    Patient ID: Teresa [name], female    DOB: [birth year], 57 y.o.   MRN: [id number]  HPI Patient presents today with complaints of reoccurring wart on right upper arm. Reports she has been pinching it off for past 6 months. Reports area around it is now red and tender. Denies drainage.     Review of Systems  Constitutional: Negative for fever and chills.  HENT: Negative for neck pain and neck stiffness.   Respiratory: Negative for chest tightness and shortness of breath.   Cardiovascular: Negative for chest pain.  Skin:       Wart to right upper arm surrounded with redness and tenderness       Objective:   Physical Exam  Constitutional: She is oriented to person, place, and time. She appears well-developed and well-nourished.  Cardiovascular: Normal rate, regular rhythm and normal heart sounds.   Pulmonary/Chest: Effort normal and breath sounds normal. No respiratory distress. She exhibits no tenderness.  Neurological: She is alert and oriented to person, place, and time.  Skin: Skin is warm and dry. There is erythema.  Erythema and tenderness surrounding wart. Negative for drainage. Warm to touch  Psychiatric: She has a normal mood and affect.          Assessment & Plan:  1. Skin infection - cephALEXin (KEFLEX) 500 MG capsule; Take 1 capsule (500 mg total) by mouth 2 (two) times daily.  Dispense: 20 capsule; Refill: 0 -keep clean and dry -maintain appointment currently scheduled with Dermatologist in [place] in September -RTO if symptoms worsen, prior to being seen by Dermatologist -Patient verbalized understanding -Coralie Keens, FNP-C

## 2012-11-07 ENCOUNTER — Other Ambulatory Visit: Payer: Self-pay

## 2012-11-07 MED ORDER — POLYETHYLENE GLYCOL 3350 17 G PO PACK: 17.0000 g | PACK | Freq: Every day | ORAL | Status: DC | PRN

## 2012-11-07 MED ORDER — MONTELUKAST SODIUM 10 MG PO TABS: 10.0000 mg | ORAL_TABLET | Freq: Every day | ORAL | Status: DC

## 2012-11-09 ENCOUNTER — Telehealth: Payer: Self-pay | Admitting: General Practice

## 2012-11-10 ENCOUNTER — Ambulatory Visit (INDEPENDENT_AMBULATORY_CARE_PROVIDER_SITE_OTHER): Payer: Medicare Other | Admitting: Nurse Practitioner

## 2012-11-10 ENCOUNTER — Ambulatory Visit (INDEPENDENT_AMBULATORY_CARE_PROVIDER_SITE_OTHER): Payer: Medicare Other

## 2012-11-10 VITALS — BP 125/82 | HR 79 | Temp 97.3°F | Ht 62.0 in | Wt 164.0 lb

## 2012-11-10 DIAGNOSIS — K5900 Constipation, unspecified: Secondary | ICD-10-CM

## 2012-11-10 DIAGNOSIS — R109 Unspecified abdominal pain: Secondary | ICD-10-CM

## 2012-11-10 LAB — POCT UA - MICROSCOPIC ONLY
Bacteria, U Microscopic: NEGATIVE
Casts, Ur, LPF, POC: NEGATIVE
Crystals, Ur, HPF, POC: NEGATIVE
Mucus, UA: NEGATIVE

## 2012-11-10 LAB — POCT URINALYSIS DIPSTICK
Blood, UA: NEGATIVE
Ketones, UA: NEGATIVE
Protein, UA: NEGATIVE
Spec Grav, UA: 1.015
Urobilinogen, UA: NEGATIVE
pH, UA: 7

## 2012-11-10 NOTE — Progress Notes (Signed)
  Subjective:    Patient ID: Teresa [name], female    DOB: 12-15-1955, 57 y.o.   MRN: [id number]  Abdominal Pain This is a new problem. The current episode started in the past 7 days. The onset quality is sudden. The problem occurs constantly. The problem has been waxing and waning. The pain is located in the suprapubic region. The pain is at a severity of 6/10. The pain is moderate. The quality of the pain is cramping. The abdominal pain does not radiate. Associated symptoms include nausea. Pertinent negatives include no belching, constipation, hematochezia, hematuria, vomiting or weight loss. Nothing aggravates the pain. The pain is relieved by nothing. She has tried nothing for the symptoms.      Review of Systems  Constitutional: Negative for weight loss.  Gastrointestinal: Positive for nausea and abdominal pain. Negative for vomiting, constipation and hematochezia.  Genitourinary: Negative for hematuria.       Objective:   Physical Exam  Abdominal: Soft. Bowel sounds are normal. There is tenderness (mild suprapubic pain on palpation).  Dull to precussion LLQ  Genitourinary:  No CVA tnederness    BP 125/82  Pulse 79  Temp(Src) 97.3 F (36.3 C) (Oral)  Ht 5\' 2"  (1.575 m)  Wt 164 lb (74.39 kg)  BMI 29.99 kg/m2 Results for orders placed in visit on 11/10/12  POCT UA - MICROSCOPIC ONLY      Result Value Range   WBC, Ur, HPF, POC 1-5     RBC, urine, microscopic neg     Bacteria, U Microscopic neg     Mucus, UA neg     Epithelial cells, urine per micros occ     Crystals, Ur, HPF, POC neg     Casts, Ur, LPF, POC neg     Yeast, UA neg    POCT URINALYSIS DIPSTICK      Result Value Range   Color, UA yellow     Clarity, UA clear     Glucose, UA neg     Bilirubin, UA neg     Ketones, UA neg     Spec Grav, UA 1.015     Blood, UA neg     pH, UA 7.0     Protein, UA neg     Urobilinogen, UA negative     Nitrite, UA neg     Leukocytes, UA Trace           Assessment &  Plan:   1. Constipation   2. Abdominal pain   Dose of MOM and prune juice OTC miralax daily Increase fiber in diet  Mary-Margaret Daphine Deutscher, FNP

## 2012-11-10 NOTE — Patient Instructions (Signed)

## 2012-11-10 NOTE — Telephone Encounter (Signed)
Patient came in through triage and will be seen today.

## 2012-11-14 DIAGNOSIS — L570 Actinic keratosis: Secondary | ICD-10-CM | POA: Diagnosis not present

## 2012-11-14 DIAGNOSIS — L820 Inflamed seborrheic keratosis: Secondary | ICD-10-CM | POA: Diagnosis not present

## 2012-11-14 DIAGNOSIS — D485 Neoplasm of uncertain behavior of skin: Secondary | ICD-10-CM | POA: Diagnosis not present

## 2012-11-14 DIAGNOSIS — L819 Disorder of pigmentation, unspecified: Secondary | ICD-10-CM | POA: Diagnosis not present

## 2012-11-17 DIAGNOSIS — Z23 Encounter for immunization: Secondary | ICD-10-CM | POA: Diagnosis not present

## 2012-12-05 ENCOUNTER — Ambulatory Visit (INDEPENDENT_AMBULATORY_CARE_PROVIDER_SITE_OTHER): Payer: Medicare Other | Admitting: Family Medicine

## 2012-12-05 ENCOUNTER — Encounter: Payer: Self-pay | Admitting: Family Medicine

## 2012-12-05 VITALS — BP 112/76 | HR 98 | Temp 97.2°F | Ht 62.0 in | Wt 164.4 lb

## 2012-12-05 DIAGNOSIS — IMO0002 Reserved for concepts with insufficient information to code with codable children: Secondary | ICD-10-CM

## 2012-12-05 DIAGNOSIS — F411 Generalized anxiety disorder: Secondary | ICD-10-CM

## 2012-12-05 DIAGNOSIS — F329 Major depressive disorder, single episode, unspecified: Secondary | ICD-10-CM | POA: Diagnosis not present

## 2012-12-05 DIAGNOSIS — R202 Paresthesia of skin: Secondary | ICD-10-CM | POA: Insufficient documentation

## 2012-12-05 DIAGNOSIS — R209 Unspecified disturbances of skin sensation: Secondary | ICD-10-CM | POA: Diagnosis not present

## 2012-12-05 DIAGNOSIS — M1710 Unilateral primary osteoarthritis, unspecified knee: Secondary | ICD-10-CM

## 2012-12-05 DIAGNOSIS — F1011 Alcohol abuse, in remission: Secondary | ICD-10-CM

## 2012-12-05 DIAGNOSIS — R739 Hyperglycemia, unspecified: Secondary | ICD-10-CM

## 2012-12-05 DIAGNOSIS — R7309 Other abnormal glucose: Secondary | ICD-10-CM

## 2012-12-05 DIAGNOSIS — K5900 Constipation, unspecified: Secondary | ICD-10-CM

## 2012-12-05 DIAGNOSIS — F3289 Other specified depressive episodes: Secondary | ICD-10-CM

## 2012-12-05 LAB — POCT CBC
Granulocyte percent: 65.7 %G (ref 37–80)
HCT, POC: 41.2 % (ref 37.7–47.9)
Hemoglobin: 13.6 g/dL (ref 12.2–16.2)
Lymph, poc: 2 (ref 0.6–3.4)
MCH, POC: 25.9 pg — AB (ref 27–31.2)
MCHC: 32.9 g/dL (ref 31.8–35.4)
MCV: 78.8 fL — AB (ref 80–97)
MPV: 7.8 fL (ref 0–99.8)
POC Granulocyte: 4.3 (ref 2–6.9)
POC LYMPH PERCENT: 29.8 %L (ref 10–50)
Platelet Count, POC: 184 10*3/uL (ref 142–424)
RBC: 5.2 M/uL (ref 4.04–5.48)
RDW, POC: 14.3 %
WBC: 6.6 10*3/uL (ref 4.6–10.2)

## 2012-12-05 NOTE — Progress Notes (Signed)
Patient ID: WENDI LASTRA, female   DOB: 1955-10-14, 57 y.o.   MRN: [id number] SUBJECTIVE: CC: Chief Complaint  Patient presents with  . Follow-up    4 month follow up states her head tingles at times     HPI: Tingling of the scalp. Under a lot of stress with family members involved in substance abuses. They come home and produces a lot of stress. The patient herself has  Had a h/o of drug abuse. Tingling worse with confrontations at home. Not in danger at home.  Past Medical History  Diagnosis Date  . Diverticulosis 07-08-2005    colonoscopy  . Hiatal hernia 07-08-2005    EGD  . GERD (gastroesophageal reflux disease) 07-08-2005    EGD  . Irritable bowel syndrome   . Vocal cord polyp     history of  . Degeneration of intervertebral disc, site unspecified   . Osteoarthrosis, unspecified whether generalized or localized, lower leg   . Depressive disorder, not elsewhere classified   . Anxiety state, unspecified   . Full dentures   . Seizures 1990s    x 1, unknown cause; no seizures since  . Chronic back pain greater than 3 months duration   . Seasonal allergies   . Papilloma of breast 09/2011    left  . Asthma     prn inhaler  . Esophageal stricture    Past Surgical History  Procedure Laterality Date  . Cholecystectomy    . Bladder surgery      bladder tack  . 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 Cystoscopy  08/24/2011    Procedure: CYSTOSCOPY;  Surgeon: Martina Sinner, MD;  Location: [facility];  Service: Urology;  Laterality: N/A;  Cystoscopy,  Rectocele Repair and Vault Prolapse Repair  . Rectocele repair  08/24/2011    Procedure: POSTERIOR REPAIR (RECTOCELE);  Surgeon: Martina Sinner, MD;  Location: [facility];  Service: Urology;  Laterality: N/A;  . Vaginal prolapse repair  08/24/2011    Procedure: VAGINAL VAULT SUSPENSION;  Surgeon: Martina Sinner, MD;  Location: [facility];  Service: Urology;  Laterality: N/A;  . Abdominal hysterectomy      partial  . Bilateral  salpingoophorectomy    . Other surgical history      exc. vocal cord polyp  . Breast biopsy  11/03/2011    Procedure: BREAST BIOPSY WITH NEEDLE LOCALIZATION;  Surgeon: Shelly Rubenstein, MD;  Location: [facility];  Service: General;  Laterality: Left;  needle localized left breast biopsy   History   Social History  . Marital Status: Divorced    Spouse Name: N/A    Number of Children: 1  . Years of Education: N/A   Occupational History  . Disabled    Social History Main Topics  . Smoking status: Former Smoker    Quit date: 08/20/1991  . Smokeless tobacco: Never Used  . Alcohol Use: No  . Drug Use: No  . Sexual Activity: No   Other Topics Concern  . Not on file   Social History Narrative  . No narrative on file   Family History  Problem Relation Age of Onset  . Prostate cancer Father   . Stomach cancer Maternal Uncle   . Diabetes Maternal Grandmother   . Heart disease Paternal Grandmother   . Irritable bowel syndrome     Current Outpatient Prescriptions on File Prior to Visit  Medication Sig Dispense Refill  . albuterol (PROAIR HFA) 108 (90 BASE)  MCG/ACT inhaler Inhale 2 puffs into the lungs every 6 (six) hours as needed. Wheezing and shortness of breath      . aspirin EC 81 MG tablet Take 324 mg by mouth once as needed for pain.      . cetirizine (ZYRTEC) 10 MG tablet Take 1 tablet (10 mg total) by mouth every morning.  30 tablet  2  . cyclobenzaprine (FLEXERIL) 10 MG tablet Take 10 mg by mouth 3 (three) times daily as needed. spasms      . dexlansoprazole (DEXILANT) 60 MG capsule Take 1 capsule (60 mg total) by mouth daily.  90 capsule  0  . Fiber Complete TABS Take 1 tablet by mouth daily.        . Fluticasone-Salmeterol (ADVAIR DISKUS) 100-50 MCG/DOSE AEPB Inhale 1 puff into the lungs 2 (two) times daily.  60 each  2  . LORazepam (ATIVAN) 1 MG tablet       . lubiprostone (AMITIZA) 8 MCG capsule Take 1 capsule (8 mcg total) by mouth 2 (two) times daily  with a meal.  60 capsule  2  . montelukast (SINGULAIR) 10 MG tablet Take 1 tablet (10 mg total) by mouth at bedtime.  30 tablet  5  . polyethylene glycol (MIRALAX / GLYCOLAX) packet Take 17 g by mouth daily as needed (for constipation).  14 each  5   No current facility-administered medications on file prior to visit.   Allergies  Allergen Reactions  . Arthrotec [Diclofenac-Misoprostol]   . Keflex [Cephalexin]   . Naproxen     Irritates stomach  . Prozac [Fluoxetine Hcl]     "makes me feel bad"  . Vicodin [Hydrocodone-Acetaminophen]   . Ambien [Zolpidem Tartrate] Other (See Comments)    COULDN'T FUNCTION, STAYED SLEEPY  . Gabapentin Other (See Comments)    COULDN'T FUNCTION, STAYED SLEEPY    There is no immunization history on file for this patient. Prior to Admission medications   Medication Sig Start Date End Date Taking? Authorizing Provider  albuterol (PROAIR HFA) 108 (90 BASE) MCG/ACT inhaler Inhale 2 puffs into the lungs every 6 (six) hours as needed. Wheezing and shortness of breath   Yes Historical Provider, MD  aspirin EC 81 MG tablet Take 324 mg by mouth once as needed for pain.   Yes Historical Provider, MD  cetirizine (ZYRTEC) 10 MG tablet Take 1 tablet (10 mg total) by mouth every morning. 10/03/12  Yes Mary-Margaret Daphine Deutscher, FNP  cyclobenzaprine (FLEXERIL) 10 MG tablet Take 10 mg by mouth 3 (three) times daily as needed. spasms   Yes Historical Provider, MD  dexlansoprazole (DEXILANT) 60 MG capsule Take 1 capsule (60 mg total) by mouth daily. 10/03/12  Yes Deatra Canter, FNP  Fiber Complete TABS Take 1 tablet by mouth daily.     Yes Historical Provider, MD  Fluticasone-Salmeterol (ADVAIR DISKUS) 100-50 MCG/DOSE AEPB Inhale 1 puff into the lungs 2 (two) times daily. 10/03/12  Yes Deatra Canter, FNP  LORazepam (ATIVAN) 1 MG tablet  09/07/12  Yes Historical Provider, MD  lubiprostone (AMITIZA) 8 MCG capsule Take 1 capsule (8 mcg total) by mouth 2 (two) times daily with a  meal. 10/03/12  Yes Deatra Canter, FNP  montelukast (SINGULAIR) 10 MG tablet Take 1 tablet (10 mg total) by mouth at bedtime. 11/07/12  Yes Mae Shelda Altes, FNP  polyethylene glycol (MIRALAX / GLYCOLAX) packet Take 17 g by mouth daily as needed (for constipation). 11/07/12  Yes Ernestina Penna, MD  ROS: As above in the HPI. All other systems are stable or negative.  OBJECTIVE: APPEARANCE:  Patient in no acute distress.The patient appeared well nourished and normally developed. Acyanotic. Waist: VITAL SIGNS:BP 112/76  Pulse 98  Temp(Src) 97.2 F (36.2 C) (Oral)  Ht 5\' 2"  (1.575 m)  Wt 164 lb 6.4 oz (74.571 kg)  BMI 30.06 kg/m2 WF  SKIN: warm and  Dry without overt rashes, tattoos and scars  HEAD and Neck: without JVD, Head and scalp: normal. No lesions in scalp Eyes:No scleral icterus. Fundi normal, eye movements normal. Ears: Auricle normal, canal normal, Tympanic membranes normal, insufflation normal. Nose: normal Throat: normal Neck & thyroid: normal  CHEST & LUNGS: Chest wall: normal Lungs: Clear  CVS: Reveals the PMI to be normally located. Regular rhythm, First and Second Heart sounds are normal,  absence of murmurs, rubs or gallops. Peripheral vasculature: Radial pulses: normal Dorsal pedis pulses: normal Posterior pulses: normal  ABDOMEN:  Appearance: normal Benign, no organomegaly, no masses, no Abdominal Aortic enlargement. No Guarding , no rebound. No Bruits. Bowel sounds: normal  RECTAL: N/A GU: N/A  EXTREMETIES: nonedematous.  MUSCULOSKELETAL:  Spine: normal Joints: intact  NEUROLOGIC: oriented to time,place and person; nonfocal. Strength is normal Sensory is normal Reflexes are normal Cranial Nerves are normal.  Results for orders placed in visit on 11/10/12  POCT UA - MICROSCOPIC ONLY      Result Value Range   WBC, Ur, HPF, POC 1-5     RBC, urine, microscopic neg     Bacteria, U Microscopic neg     Mucus, UA neg     Epithelial  cells, urine per micros occ     Crystals, Ur, HPF, POC neg     Casts, Ur, LPF, POC neg     Yeast, UA neg    POCT URINALYSIS DIPSTICK      Result Value Range   Color, UA yellow     Clarity, UA clear     Glucose, UA neg     Bilirubin, UA neg     Ketones, UA neg     Spec Grav, UA 1.015     Blood, UA neg     pH, UA 7.0     Protein, UA neg     Urobilinogen, UA negative     Nitrite, UA neg     Leukocytes, UA Trace      ASSESSMENT: Paresthesias - Plan: POCT CBC, CMP14+EGFR, Vitamin B12, Folate, TSH, Sedimentation rate  Alcohol abuse, in remission  ANXIETY  Constipation  DEGENERATIVE DISC DISEASE  DEGENERATIVE JOINT DISEASE, KNEES, BILATERAL  DEPRESSION - Plan: Vitamin B12, Folate, TSH  The paresthesias are stress related.  PLAN: Recommend counseling with her minister or to self  Refer to [facility].  Await work up.  No change in regimen. Stress reduction.  Orders Placed This Encounter  Procedures  . CMP14+EGFR  . Vitamin B12  . Folate  . TSH  . Sedimentation rate  . POCT CBC    Balanced lifestyle  Return in about 6 months (around 06/05/2013) for Recheck medical problems.  Adanna Zuckerman P. [place] Charon, M.D.

## 2012-12-06 DIAGNOSIS — R739 Hyperglycemia, unspecified: Secondary | ICD-10-CM | POA: Insufficient documentation

## 2012-12-06 LAB — CMP14+EGFR
ALT: 32 IU/L (ref 0–32)
AST: 27 IU/L (ref 0–40)
Albumin/Globulin Ratio: 1.5 (ref 1.1–2.5)
Albumin: 4 g/dL (ref 3.5–5.5)
Alkaline Phosphatase: 100 IU/L (ref 39–117)
BUN/Creatinine Ratio: 10 (ref 9–23)
BUN: 8 mg/dL (ref 6–24)
CO2: 21 mmol/L (ref 18–29)
Calcium: 9.1 mg/dL (ref 8.7–10.2)
Chloride: 102 mmol/L (ref 97–108)
Creatinine, Ser: 0.79 mg/dL (ref 0.57–1.00)
GFR calc Af Amer: 98 mL/min/{1.73_m2} (ref >59)
GFR calc non Af Amer: 85 mL/min/{1.73_m2} (ref >59)
Globulin, Total: 2.6 g/dL (ref 1.5–4.5)
Glucose: 162 mg/dL — ABNORMAL HIGH (ref 65–99)
Potassium: 3.8 mmol/L (ref 3.5–5.2)
Sodium: 140 mmol/L (ref 134–144)
Total Bilirubin: 0.9 mg/dL (ref 0.0–1.2)
Total Protein: 6.6 g/dL (ref 6.0–8.5)

## 2012-12-06 LAB — SEDIMENTATION RATE: Sed Rate: 11 mm/hr (ref 0–40)

## 2012-12-06 LAB — VITAMIN B12: Vitamin B-12: 968 pg/mL — ABNORMAL HIGH (ref 211–946)

## 2012-12-06 LAB — TSH: TSH: 2.04 u[IU]/mL (ref 0.450–4.500)

## 2012-12-06 LAB — FOLATE: Folate: >19.9 ng/mL (ref >3.0)

## 2012-12-06 NOTE — Addendum Note (Signed)
Addended by: Ileana Ladd on: 12/06/2012 12:04 PM   Modules accepted: Orders

## 2012-12-06 NOTE — Progress Notes (Signed)
Quick Note:  Labs abnormal. Sugar high. Ordered HGBA1C ordered in Reflex. Await that result. The rest of the labs were okay.  ______

## 2012-12-07 ENCOUNTER — Other Ambulatory Visit: Payer: Self-pay | Admitting: *Deleted

## 2012-12-07 MED ORDER — FLUTICASONE-SALMETEROL 100-50 MCG/DOSE IN AEPB: 1.0000 | INHALATION_SPRAY | Freq: Two times a day (BID) | RESPIRATORY_TRACT | Status: DC

## 2012-12-07 MED ORDER — LUBIPROSTONE 8 MCG PO CAPS: 8.0000 ug | ORAL_CAPSULE | Freq: Two times a day (BID) | ORAL | Status: DC

## 2012-12-07 NOTE — Telephone Encounter (Signed)
Prescription renewed in EPIC. 

## 2012-12-13 ENCOUNTER — Telehealth: Payer: Self-pay | Admitting: Family Medicine

## 2012-12-14 NOTE — Telephone Encounter (Signed)
Pt aware of labs  

## 2012-12-25 DIAGNOSIS — IMO0002 Reserved for concepts with insufficient information to code with codable children: Secondary | ICD-10-CM | POA: Diagnosis not present

## 2012-12-25 DIAGNOSIS — M545 Low back pain: Secondary | ICD-10-CM | POA: Diagnosis not present

## 2012-12-25 DIAGNOSIS — M159 Polyosteoarthritis, unspecified: Secondary | ICD-10-CM | POA: Diagnosis not present

## 2012-12-25 DIAGNOSIS — G894 Chronic pain syndrome: Secondary | ICD-10-CM | POA: Diagnosis not present

## 2012-12-29 ENCOUNTER — Other Ambulatory Visit: Payer: Self-pay | Admitting: *Deleted

## 2012-12-29 MED ORDER — DEXLANSOPRAZOLE 60 MG PO CPDR: 60.0000 mg | DELAYED_RELEASE_CAPSULE | Freq: Every day | ORAL | Status: DC

## 2013-01-23 DIAGNOSIS — Z1231 Encounter for screening mammogram for malignant neoplasm of breast: Secondary | ICD-10-CM | POA: Diagnosis not present

## 2013-02-05 ENCOUNTER — Other Ambulatory Visit: Payer: Self-pay | Admitting: *Deleted

## 2013-02-05 NOTE — Telephone Encounter (Signed)
Notified pt that she would an appt Verbalizes understanding Appt scheduled with Dr Alvester Morin

## 2013-02-05 NOTE — Telephone Encounter (Signed)
Last seen 12/05/12, last filled 12/27/12. Route to pool A, [facility]

## 2013-02-05 NOTE — Telephone Encounter (Signed)
Patient needs to be seen. Patient has exceeded limit since last visit. Refill denied. Bring all medications at next office visit. 

## 2013-02-07 ENCOUNTER — Encounter: Payer: Self-pay | Admitting: Family Medicine

## 2013-02-07 ENCOUNTER — Ambulatory Visit (INDEPENDENT_AMBULATORY_CARE_PROVIDER_SITE_OTHER): Payer: Medicare Other | Admitting: Family Medicine

## 2013-02-07 VITALS — BP 116/72 | HR 104 | Temp 98.1°F | Ht 62.0 in | Wt 164.0 lb

## 2013-02-07 DIAGNOSIS — F39 Unspecified mood [affective] disorder: Secondary | ICD-10-CM | POA: Diagnosis not present

## 2013-02-07 MED ORDER — CLONAZEPAM 0.5 MG PO TABS: 0.5000 mg | ORAL_TABLET | Freq: Two times a day (BID) | ORAL | Status: DC | PRN

## 2013-02-07 MED ORDER — MIRTAZAPINE 15 MG PO TABS: ORAL_TABLET | ORAL | Status: DC

## 2013-02-07 NOTE — Progress Notes (Signed)
   Subjective:    Patient ID: Teresa [name], female    DOB: [birth year], 57 y.o.   MRN: [id number]  HPI Pt presents today for mood follow up  Pt was asking for refill of ativan  Has had some worsening anxiety Daughter is currently on drugs and she is having to take care of grandchildren.  Baseline hx/o anxiety and depression Has also had issues with poor sleep and mild racing thoughts at night Denies any ETOH or drug abuse.  No HI/SI.  Denies any extended periods without sleep or extended periods of buying/social indiscretions.  No known prior hx/o bipolar disorder.     Review of Systems  All other systems reviewed and are negative.       Objective:   Physical Exam  Constitutional: She appears well-developed and well-nourished.  HENT:  Head: Normocephalic and atraumatic.  Eyes: Conjunctivae are normal. Pupils are equal, round, and reactive to light.  Neck: Normal range of motion. Neck supple.  Cardiovascular: Normal rate and regular rhythm.   Pulmonary/Chest: Effort normal and breath sounds normal.  Abdominal: Soft.  Musculoskeletal: Normal range of motion.  Neurological: She is alert.  Skin: Skin is warm.  Psychiatric:  Mildly flat affect            Assessment & Plan:  Mood disorder - Plan: mirtazapine (REMERON) 15 MG tablet, clonazePAM (KLONOPIN) 0.5 MG tablet  PHQ-9 score of 18 today  Will start pt on remeron as sleep has been a predominant issue Prn low dose klonopin in place of ativan.  Discussed psych red flags at length.  Follow up in 2 weeks for mood recheck.

## 2013-02-12 ENCOUNTER — Ambulatory Visit: Payer: Medicare Other | Admitting: Nurse Practitioner

## 2013-02-16 ENCOUNTER — Encounter: Payer: Medicare Other | Admitting: Nurse Practitioner

## 2013-02-16 VITALS — BP 124/77 | HR 99 | Temp 97.0°F | Ht <= 58 in | Wt 163.0 lb

## 2013-02-16 NOTE — Progress Notes (Signed)
   Subjective:    Patient ID: Teresa [name], female    DOB: 06/12/1955, 57 y.o.   MRN: [id number]  HPI    Review of Systems     Objective:   Physical Exam        Assessment & Plan:  Too early

## 2013-02-26 ENCOUNTER — Ambulatory Visit: Payer: Medicare Other | Admitting: Family Medicine

## 2013-03-05 ENCOUNTER — Ambulatory Visit: Payer: Medicare Other | Admitting: Family Medicine

## 2013-03-08 ENCOUNTER — Other Ambulatory Visit: Payer: Self-pay

## 2013-03-08 MED ORDER — ALBUTEROL SULFATE HFA 108 (90 BASE) MCG/ACT IN AERS: 2.0000 | INHALATION_SPRAY | Freq: Four times a day (QID) | RESPIRATORY_TRACT | Status: DC | PRN

## 2013-03-19 ENCOUNTER — Encounter: Payer: Self-pay | Admitting: *Deleted

## 2013-03-26 ENCOUNTER — Telehealth: Payer: Self-pay | Admitting: Family Medicine

## 2013-03-27 ENCOUNTER — Other Ambulatory Visit: Payer: Self-pay | Admitting: Family Medicine

## 2013-03-27 MED ORDER — PANTOPRAZOLE SODIUM 40 MG PO TBEC: 40.0000 mg | DELAYED_RELEASE_TABLET | Freq: Every day | ORAL | Status: DC

## 2013-03-27 MED ORDER — BUDESONIDE-FORMOTEROL FUMARATE 160-4.5 MCG/ACT IN AERO: 2.0000 | INHALATION_SPRAY | Freq: Two times a day (BID) | RESPIRATORY_TRACT | Status: DC

## 2013-03-27 NOTE — Telephone Encounter (Signed)
Insurance will not cover Advair or Dexilant. Wants to try Omeprazole 20mg  bid and and another kind of inhaler that is generic. [facility]ses [facility].

## 2013-03-27 NOTE — Telephone Encounter (Signed)
Call patient : Prescription changed to symbicort and to protonix. & sent to pharmacy in [place].

## 2013-03-27 NOTE — Telephone Encounter (Signed)
Pt notified and verbalized understanding.

## 2013-03-30 ENCOUNTER — Encounter: Payer: Self-pay | Admitting: General Practice

## 2013-03-30 ENCOUNTER — Ambulatory Visit (INDEPENDENT_AMBULATORY_CARE_PROVIDER_SITE_OTHER): Payer: Medicare Other | Admitting: General Practice

## 2013-03-30 VITALS — BP 135/83 | HR 106 | Temp 97.9°F | Ht 62.0 in | Wt 164.0 lb

## 2013-03-30 DIAGNOSIS — M79609 Pain in unspecified limb: Secondary | ICD-10-CM

## 2013-03-30 DIAGNOSIS — M79629 Pain in unspecified upper arm: Secondary | ICD-10-CM

## 2013-03-30 NOTE — Patient Instructions (Signed)
Exercise to Stay Healthy Exercise helps you become and stay healthy. EXERCISE IDEAS AND TIPS Choose exercises that:  You enjoy.  Fit into your day. You do not need to exercise really hard to be healthy. You can do exercises at a slow or medium level and stay healthy. You can:  Stretch before and after working out.  Try yoga, Pilates, or tai chi.  Lift weights.  Walk fast, swim, jog, run, climb stairs, bicycle, dance, or rollerskate.  Take aerobic classes. Exercises that burn about 150 calories:  Running 1  miles in 15 minutes.  Playing volleyball for 45 to 60 minutes.  Washing and waxing a car for 45 to 60 minutes.  Playing touch football for 45 minutes.  Walking 1  miles in 35 minutes.  Pushing a stroller 1  miles in 30 minutes.  Playing basketball for 30 minutes.  Raking leaves for 30 minutes.  Bicycling 5 miles in 30 minutes.  Walking 2 miles in 30 minutes.  Dancing for 30 minutes.  Shoveling snow for 15 minutes.  Swimming laps for 20 minutes.  Walking up stairs for 15 minutes.  Bicycling 4 miles in 15 minutes.  Gardening for 30 to 45 minutes.  Jumping rope for 15 minutes.  Washing windows or floors for 45 to 60 minutes. Document Released: 03/20/2010 Document Revised: 05/10/2011 Document Reviewed: 03/20/2010 [employer] Patient Information 2014 [employer].  

## 2013-03-30 NOTE — Progress Notes (Signed)
   Subjective:    Patient ID: Teresa [name], female    DOB: April 15, 1955, 58 y.o.   MRN: [id number]  HPI Patient presents with underarm pain, both arms. Onset 2-3 months ago, intermittent. Rates as 1-2 on 1-10 scale. Denies feeling mass or any discharge. Reports using antiperspirant instead of Deoderant. Mammogram less than one year ago.     Review of Systems  Constitutional: Negative for fever and chills.  Respiratory: Negative for chest tightness and shortness of breath.   Cardiovascular: Negative for chest pain and palpitations.  Neurological: Negative for dizziness, weakness, numbness and headaches.       Objective:   Physical Exam  Constitutional: She is oriented to person, place, and time. She appears well-developed and well-nourished.  Neck: Normal range of motion. Neck supple. No thyromegaly present.  Cardiovascular: Normal rate, regular rhythm and normal heart sounds.   Pulmonary/Chest: Effort normal and breath sounds normal. No respiratory distress. She exhibits no tenderness.  Musculoskeletal:  Unable to reproduce pain of axillae. Negative erythema or edema  Lymphadenopathy:    She has no cervical adenopathy.  Neurological: She is alert and oriented to person, place, and time.  Skin: Skin is warm and dry.  Psychiatric: She has a normal mood and affect.          Assessment & Plan:  1. Axillary pain -Refrain from using antiperspirant and use Deoderant -RTO if symptom unresolved -will send for ultrasound soft tissue (axillae) -Patient verbalized understanding Erby Pian, FNP-C

## 2013-04-04 DIAGNOSIS — Z79899 Other long term (current) drug therapy: Secondary | ICD-10-CM | POA: Diagnosis not present

## 2013-04-04 DIAGNOSIS — M545 Low back pain, unspecified: Secondary | ICD-10-CM | POA: Diagnosis not present

## 2013-04-04 DIAGNOSIS — G894 Chronic pain syndrome: Secondary | ICD-10-CM | POA: Diagnosis not present

## 2013-04-04 DIAGNOSIS — IMO0002 Reserved for concepts with insufficient information to code with codable children: Secondary | ICD-10-CM | POA: Diagnosis not present

## 2013-04-19 ENCOUNTER — Telehealth: Payer: Self-pay | Admitting: Family Medicine

## 2013-04-20 NOTE — Telephone Encounter (Signed)
Stay on it and add Zantac 300 mg in am with it and give it 4 weeks together. Zantac is 75 mg OTC. Zaidan Keeble P. Jacelyn Grip, M.D.

## 2013-04-20 NOTE — Telephone Encounter (Signed)
Pt notified and instructions below given .

## 2013-04-25 ENCOUNTER — Encounter: Payer: Self-pay | Admitting: Family Medicine

## 2013-04-25 ENCOUNTER — Ambulatory Visit (INDEPENDENT_AMBULATORY_CARE_PROVIDER_SITE_OTHER): Payer: Medicare Other | Admitting: Family Medicine

## 2013-04-25 VITALS — BP 115/80 | HR 100 | Temp 97.8°F | Ht 62.0 in | Wt 164.0 lb

## 2013-04-25 DIAGNOSIS — J209 Acute bronchitis, unspecified: Secondary | ICD-10-CM | POA: Diagnosis not present

## 2013-04-25 DIAGNOSIS — R05 Cough: Secondary | ICD-10-CM

## 2013-04-25 DIAGNOSIS — R6889 Other general symptoms and signs: Secondary | ICD-10-CM

## 2013-04-25 DIAGNOSIS — R062 Wheezing: Secondary | ICD-10-CM | POA: Diagnosis not present

## 2013-04-25 DIAGNOSIS — R059 Cough, unspecified: Secondary | ICD-10-CM | POA: Diagnosis not present

## 2013-04-25 LAB — POCT INFLUENZA A/B
Influenza A, POC: NEGATIVE
Influenza B, POC: NEGATIVE

## 2013-04-25 MED ORDER — PREDNISONE 50 MG PO TABS: ORAL_TABLET | ORAL | Status: DC

## 2013-04-25 MED ORDER — AZITHROMYCIN 250 MG PO TABS: ORAL_TABLET | ORAL | Status: DC

## 2013-04-25 MED ORDER — OSELTAMIVIR PHOSPHATE 75 MG PO CAPS: 75.0000 mg | ORAL_CAPSULE | Freq: Two times a day (BID) | ORAL | Status: DC

## 2013-04-25 NOTE — Progress Notes (Signed)
   Subjective:    Patient ID: Teresa [name], female    DOB: 08/11/1955, 58 y.o.   MRN: [id number]  HPI URI Symptoms Onset: 2 days  Description: rhinorrhea, nasal congestion, cough, body aches, chills, fever  Modifying factors:  Baseline asthma, increased wheezing, + flu shot this year   Symptoms Nasal discharge: yes Fever: yes Sore throat: no Cough: yes Wheezing: yes Ear pain: no GI symptoms: no Sick contacts: yes; grandson with flu   Red Flags  Stiff neck: no Dyspnea: no Rash: no Swallowing difficulty: no  Sinusitis Risk Factors Headache/face pain: yes Double sickening: no tooth pain: n  Allergy Risk Factors Sneezing: no Itchy scratchy throat: no Seasonal symptoms: no  Flu Risk Factors Headache: yes muscle aches: yes severe fatigue: mild     Review of Systems  All other systems reviewed and are negative.       Objective:   Physical Exam  Constitutional: She appears well-developed and well-nourished.  HENT:  Head: Normocephalic and atraumatic.  Right Ear: External ear normal.  Left Ear: External ear normal.  +nasal erythema, rhinorrhea bilaterally, + post oropharyngeal erythema    Eyes: Conjunctivae are normal. Pupils are equal, round, and reactive to light.  Neck: Normal range of motion. Neck supple.  Cardiovascular: Normal rate and regular rhythm.   Pulmonary/Chest: Effort normal. She has wheezes.  Abdominal: Soft.  Musculoskeletal: Normal range of motion.  Neurological: She is alert.  Skin: Skin is warm.          Assessment & Plan:  Cough - Plan: POCT Influenza A/B  Flu-like symptoms - Plan: oseltamivir (TAMIFLU) 75 MG capsule  Wheezing - Plan: predniSONE (DELTASONE) 50 MG tablet  Acute bronchitis - Plan: azithromycin (ZITHROMAX) 250 MG tablet  FLu like illness with secondary asthma flare  Will treat with tamiflu  Short course of prednisone zpak for lower resp coverage  Discussed supportive care and infectious/resp red flags    Follow up as needed.

## 2013-05-12 ENCOUNTER — Emergency Department (HOSPITAL_COMMUNITY): Payer: Medicare Other

## 2013-05-12 ENCOUNTER — Emergency Department (HOSPITAL_COMMUNITY)
Admission: EM | Admit: 2013-05-12 | Discharge: 2013-05-12 | Disposition: A | Discharge status: Home / Self Care | Payer: Medicare Other | Attending: Emergency Medicine | Admitting: Emergency Medicine

## 2013-05-12 ENCOUNTER — Encounter (HOSPITAL_COMMUNITY): Payer: Self-pay | Admitting: Emergency Medicine

## 2013-05-12 DIAGNOSIS — M1990 Unspecified osteoarthritis, unspecified site: Secondary | ICD-10-CM | POA: Diagnosis not present

## 2013-05-12 DIAGNOSIS — Z9889 Other specified postprocedural states: Secondary | ICD-10-CM | POA: Diagnosis not present

## 2013-05-12 DIAGNOSIS — R1032 Left lower quadrant pain: Secondary | ICD-10-CM | POA: Diagnosis not present

## 2013-05-12 DIAGNOSIS — IMO0002 Reserved for concepts with insufficient information to code with codable children: Secondary | ICD-10-CM | POA: Insufficient documentation

## 2013-05-12 DIAGNOSIS — Z8742 Personal history of other diseases of the female genital tract: Secondary | ICD-10-CM | POA: Diagnosis not present

## 2013-05-12 DIAGNOSIS — K5732 Diverticulitis of large intestine without perforation or abscess without bleeding: Secondary | ICD-10-CM | POA: Insufficient documentation

## 2013-05-12 DIAGNOSIS — Z8679 Personal history of other diseases of the circulatory system: Secondary | ICD-10-CM | POA: Insufficient documentation

## 2013-05-12 DIAGNOSIS — K589 Irritable bowel syndrome without diarrhea: Secondary | ICD-10-CM | POA: Diagnosis not present

## 2013-05-12 DIAGNOSIS — K7689 Other specified diseases of liver: Secondary | ICD-10-CM | POA: Diagnosis not present

## 2013-05-12 DIAGNOSIS — K219 Gastro-esophageal reflux disease without esophagitis: Secondary | ICD-10-CM | POA: Insufficient documentation

## 2013-05-12 DIAGNOSIS — Z90710 Acquired absence of both cervix and uterus: Secondary | ICD-10-CM | POA: Diagnosis not present

## 2013-05-12 DIAGNOSIS — Z8659 Personal history of other mental and behavioral disorders: Secondary | ICD-10-CM | POA: Insufficient documentation

## 2013-05-12 DIAGNOSIS — K461 Unspecified abdominal hernia with gangrene: Secondary | ICD-10-CM | POA: Diagnosis not present

## 2013-05-12 DIAGNOSIS — Z79899 Other long term (current) drug therapy: Secondary | ICD-10-CM | POA: Diagnosis not present

## 2013-05-12 DIAGNOSIS — G8929 Other chronic pain: Secondary | ICD-10-CM | POA: Diagnosis not present

## 2013-05-12 DIAGNOSIS — Z7982 Long term (current) use of aspirin: Secondary | ICD-10-CM | POA: Diagnosis not present

## 2013-05-12 DIAGNOSIS — Z87891 Personal history of nicotine dependence: Secondary | ICD-10-CM | POA: Diagnosis not present

## 2013-05-12 DIAGNOSIS — J45909 Unspecified asthma, uncomplicated: Secondary | ICD-10-CM | POA: Diagnosis not present

## 2013-05-12 DIAGNOSIS — Z9089 Acquired absence of other organs: Secondary | ICD-10-CM | POA: Insufficient documentation

## 2013-05-12 DIAGNOSIS — K5792 Diverticulitis of intestine, part unspecified, without perforation or abscess without bleeding: Secondary | ICD-10-CM

## 2013-05-12 LAB — CBC WITH DIFFERENTIAL/PLATELET
BASOS ABS: 0 10*3/uL (ref 0.0–0.1)
Basophils Relative: 0 % (ref 0–1)
EOS PCT: 1 % (ref 0–5)
Eosinophils Absolute: 0.1 10*3/uL (ref 0.0–0.7)
HCT: 40.6 % (ref 36.0–46.0)
Hemoglobin: 13.5 g/dL (ref 12.0–15.0)
Lymphocytes Relative: 21 % (ref 12–46)
Lymphs Abs: 2.1 10*3/uL (ref 0.7–4.0)
MCH: 27.1 pg (ref 26.0–34.0)
MCHC: 33.3 g/dL (ref 30.0–36.0)
MCV: 81.4 fL (ref 78.0–100.0)
Monocytes Absolute: 0.7 10*3/uL (ref 0.1–1.0)
Monocytes Relative: 7 % (ref 3–12)
NEUTROS ABS: 7 10*3/uL (ref 1.7–7.7)
Neutrophils Relative %: 71 % (ref 43–77)
PLATELETS: 172 10*3/uL (ref 150–400)
RBC: 4.99 MIL/uL (ref 3.87–5.11)
RDW: 14.3 % (ref 11.5–15.5)
WBC: 9.9 10*3/uL (ref 4.0–10.5)

## 2013-05-12 LAB — URINALYSIS, ROUTINE W REFLEX MICROSCOPIC
Bilirubin Urine: NEGATIVE
Glucose, UA: NEGATIVE mg/dL
HGB URINE DIPSTICK: NEGATIVE
Ketones, ur: NEGATIVE mg/dL
Leukocytes, UA: NEGATIVE
Nitrite: NEGATIVE
Protein, ur: NEGATIVE mg/dL
Specific Gravity, Urine: <1.005 — ABNORMAL LOW (ref 1.005–1.030)
UROBILINOGEN UA: 0.2 mg/dL (ref 0.0–1.0)
pH: 6 (ref 5.0–8.0)

## 2013-05-12 LAB — COMPREHENSIVE METABOLIC PANEL
ALT: 40 U/L — AB (ref 0–35)
AST: 29 U/L (ref 0–37)
Albumin: 3.7 g/dL (ref 3.5–5.2)
Alkaline Phosphatase: 94 U/L (ref 39–117)
BUN: 8 mg/dL (ref 6–23)
CALCIUM: 9.3 mg/dL (ref 8.4–10.5)
CHLORIDE: 103 meq/L (ref 96–112)
CO2: 27 meq/L (ref 19–32)
Creatinine, Ser: 0.92 mg/dL (ref 0.50–1.10)
GFR calc Af Amer: 80 mL/min — ABNORMAL LOW (ref >90)
GFR, EST NON AFRICAN AMERICAN: 69 mL/min — AB (ref >90)
Glucose, Bld: 91 mg/dL (ref 70–99)
Potassium: 4 mEq/L (ref 3.7–5.3)
SODIUM: 141 meq/L (ref 137–147)
Total Bilirubin: 1.8 mg/dL — ABNORMAL HIGH (ref 0.3–1.2)
Total Protein: 7.7 g/dL (ref 6.0–8.3)

## 2013-05-12 MED ORDER — METRONIDAZOLE 500 MG PO TABS
500.0000 mg | ORAL_TABLET | Freq: Once | ORAL | Status: AC
  Administered 2013-05-12: 500 mg via ORAL
  Filled 2013-05-12: qty 1

## 2013-05-12 MED ORDER — TRAMADOL HCL 50 MG PO TABS: 50.0000 mg | ORAL_TABLET | Freq: Four times a day (QID) | ORAL | Status: DC | PRN

## 2013-05-12 MED ORDER — ONDANSETRON HCL 4 MG/2ML IJ SOLN
4.0000 mg | Freq: Once | INTRAMUSCULAR | Status: AC
  Administered 2013-05-12: 4 mg via INTRAVENOUS
  Filled 2013-05-12: qty 2

## 2013-05-12 MED ORDER — SODIUM CHLORIDE 0.9 % IV BOLUS (SEPSIS)
1000.0000 mL | Freq: Once | INTRAVENOUS | Status: AC
Start: 2013-05-12 — End: 2013-05-12
  Administered 2013-05-12: 1000 mL via INTRAVENOUS

## 2013-05-12 MED ORDER — CIPROFLOXACIN HCL 500 MG PO TABS: 500.0000 mg | ORAL_TABLET | Freq: Two times a day (BID) | ORAL | Status: DC

## 2013-05-12 MED ORDER — ACETAMINOPHEN 325 MG PO TABS
650.0000 mg | ORAL_TABLET | Freq: Once | ORAL | Status: AC
  Administered 2013-05-12: 650 mg via ORAL
  Filled 2013-05-12: qty 2

## 2013-05-12 MED ORDER — IOHEXOL 300 MG/ML  SOLN
50.0000 mL | Freq: Once | INTRAMUSCULAR | Status: AC | PRN
  Administered 2013-05-12: 50 mL via ORAL

## 2013-05-12 MED ORDER — METRONIDAZOLE 500 MG PO TABS: 500.0000 mg | ORAL_TABLET | Freq: Two times a day (BID) | ORAL | Status: DC

## 2013-05-12 MED ORDER — IOHEXOL 300 MG/ML  SOLN
100.0000 mL | Freq: Once | INTRAMUSCULAR | Status: AC | PRN
  Administered 2013-05-12: 100 mL via INTRAVENOUS

## 2013-05-12 MED ORDER — CIPROFLOXACIN HCL 250 MG PO TABS
500.0000 mg | ORAL_TABLET | Freq: Once | ORAL | Status: AC
  Administered 2013-05-12: 500 mg via ORAL
  Filled 2013-05-12: qty 2

## 2013-05-12 NOTE — ED Notes (Signed)
Pt states lower abdominal pain with nausea. Denies vomiting or diarrhea.

## 2013-05-12 NOTE — ED Provider Notes (Signed)
CSN: [id number]     Arrival date & time 05/12/13  1443 History  This chart was scribed for Charles B. Karle Starch, MD by Zettie Pho, ED Scribe. This patient was seen in room  and the patient's care was started at 4:14 PM.    Chief Complaint  Patient presents with  . Abdominal Pain   The history is provided by the patient. No language interpreter was used.   HPI Comments: Teresa [name] is a 58 y.o. Female with a history of diverticulosis, hiatal hernia, GERD, IBS, and esophageal stricture who presents to the Emergency Department complaining of an intermittent pain to the LLQ of the abdomen with gradual onset last night. Patient states that the pain has been progressively worsening and is exacerbated with movement. Patient reports some associated nausea. She denies emesis, diarrhea, blood in the stool, dysuria. Patient has a surgical history of cholecystectomy, bladder surgery, rectal tumor by proctotomy excision, cystoscopy, rectocele repair, vaginal prolapse repair, abdominal hysterectomy. Patient has allergies to arthrotec, Keflex, naproxen, Prozac, Vicodin, Ambien, and Gabapentin.   Past Medical History  Diagnosis Date  . Diverticulosis 07-08-2005    colonoscopy  . Hiatal hernia 07-08-2005    EGD  . GERD (gastroesophageal reflux disease) 07-08-2005    EGD  . Irritable bowel syndrome   . Vocal cord polyp     history of  . Degeneration of intervertebral disc, site unspecified   . Osteoarthrosis, unspecified whether generalized or localized, lower leg   . Depressive disorder, not elsewhere classified   . Anxiety state, unspecified   . Full dentures   . Seizures 1990s    x 1, unknown cause; no seizures since  . Chronic back pain greater than 3 months duration   . Seasonal allergies   . Papilloma of breast 09/2011    left  . Asthma     prn inhaler  . Esophageal stricture    Past Surgical History  Procedure Laterality Date  . Cholecystectomy    . Bladder surgery      bladder tack  .  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 Cystoscopy  08/24/2011    Procedure: CYSTOSCOPY;  Surgeon: Reece Packer, MD;  Location: [facility];  Service: Urology;  Laterality: N/A;  Cystoscopy,  Rectocele Repair and Vault Prolapse Repair  . Rectocele repair  08/24/2011    Procedure: POSTERIOR REPAIR (RECTOCELE);  Surgeon: Reece Packer, MD;  Location: [facility];  Service: Urology;  Laterality: N/A;  . Vaginal prolapse repair  08/24/2011    Procedure: VAGINAL VAULT SUSPENSION;  Surgeon: Reece Packer, MD;  Location: [facility];  Service: Urology;  Laterality: N/A;  . Abdominal hysterectomy      partial  . Bilateral salpingoophorectomy    . Other surgical history      exc. vocal cord polyp  . Breast biopsy  11/03/2011    Procedure: BREAST BIOPSY WITH NEEDLE LOCALIZATION;  Surgeon: Harl Bowie, MD;  Location: [facility];  Service: General;  Laterality: Left;  needle localized left breast biopsy   Family History  Problem Relation Age of Onset  . Prostate cancer Father   . Stomach cancer Maternal Uncle   . Diabetes Maternal Grandmother   . Heart disease Paternal Grandmother   . Irritable bowel syndrome     History  Substance Use Topics  . Smoking status: Former Smoker    Quit date: 08/20/1991  . Smokeless tobacco: Never Used  . Alcohol Use: No   OB  History   Grav Para Term Preterm Abortions TAB SAB Ect Mult Living                 Review of Systems  A complete 10 system review of systems was obtained and all systems are negative except as noted in the HPI and PMH.    Allergies  Arthrotec; Keflex; Naproxen; Prozac; Vicodin; Ambien; and Gabapentin  Home Medications   Current Outpatient Rx  Name  Route  Sig  Dispense  Refill  . albuterol (PROAIR HFA) 108 (90 BASE) MCG/ACT inhaler   Inhalation   Inhale 2 puffs into the lungs every 6 (six) hours as needed. Wheezing and shortness of breath   18 g   2   . aspirin EC 81 MG tablet   Oral   Take 324 mg by  mouth once as needed for pain.         Marland Kitchen azithromycin (ZITHROMAX) 250 MG tablet      Take 2 tabs PO x 1 dose, then 1 tab PO QD x 4 days   6 tablet   0   . budesonide-formoterol (SYMBICORT) 160-4.5 MCG/ACT inhaler   Inhalation   Inhale 2 puffs into the lungs 2 (two) times daily.   1 Inhaler   3   . cetirizine (ZYRTEC) 10 MG tablet   Oral   Take 1 tablet (10 mg total) by mouth every morning.   30 tablet   2   . cyclobenzaprine (FLEXERIL) 10 MG tablet   Oral   Take 10 mg by mouth 3 (three) times daily as needed. spasms         . Fiber Complete TABS   Oral   Take 1 tablet by mouth daily.           Marland Kitchen lubiprostone (AMITIZA) 8 MCG capsule   Oral   Take 1 capsule (8 mcg total) by mouth 2 (two) times daily with a meal.   60 capsule   3   . MAGNESIUM PO   Oral   Take by mouth.         . montelukast (SINGULAIR) 10 MG tablet   Oral   Take 1 tablet (10 mg total) by mouth at bedtime.   30 tablet   5   . oseltamivir (TAMIFLU) 75 MG capsule   Oral   Take 1 capsule (75 mg total) by mouth 2 (two) times daily.   10 capsule   0   . pantoprazole (PROTONIX) 40 MG tablet   Oral   Take 1 tablet (40 mg total) by mouth daily.   30 tablet   3   . predniSONE (DELTASONE) 50 MG tablet      1 tab daily x 5 days   5 tablet   0    Triage Vitals: BP 127/83  Pulse 113  Temp(Src) 98.8 F (37.1 C) (Oral)  Resp 18  Ht 5\' 2"  (1.575 m)  Wt 164 lb (74.39 kg)  BMI 29.99 kg/m2  SpO2 97%  Physical Exam  Nursing note and vitals reviewed. Constitutional: She is oriented to person, place, and time. She appears well-developed and well-nourished.  HENT:  Head: Normocephalic and atraumatic.  Eyes: EOM are normal. Pupils are equal, round, and reactive to light.  Neck: Normal range of motion. Neck supple.  Cardiovascular: Normal rate, normal heart sounds and intact distal pulses.   Pulmonary/Chest: Effort normal and breath sounds normal.  Abdominal: Bowel sounds are normal. She  exhibits no distension. There is  tenderness. There is guarding.  Tenderness to palpation to the LLQ with guarding.   Musculoskeletal: Normal range of motion. She exhibits no edema and no tenderness.  Neurological: She is alert and oriented to person, place, and time. She has normal strength. No cranial nerve deficit or sensory deficit.  Skin: Skin is warm and dry. No rash noted.  Psychiatric: She has a normal mood and affect.    ED Course  Procedures (including critical care time)  DIAGNOSTIC STUDIES: Oxygen Saturation is 97% on room air, normal by my interpretation.    COORDINATION OF CARE: 4:16 PM- Will order UA, blood labs, and an abdominal CT. Will order IV fluids and Zofran to manage symptoms. Discussed treatment plan with patient at bedside and patient verbalized agreement.     Labs Review Labs Reviewed  COMPREHENSIVE METABOLIC PANEL - Abnormal; Notable for the following:    ALT 40 (*)    Total Bilirubin 1.8 (*)    GFR calc non Af Amer 69 (*)    GFR calc Af Amer 80 (*)    All other components within normal limits  URINALYSIS, ROUTINE W REFLEX MICROSCOPIC - Abnormal; Notable for the following:    Specific Gravity, Urine <1.005 (*)    All other components within normal limits  CBC WITH DIFFERENTIAL   Imaging Review Ct Abdomen Pelvis W Contrast  05/12/2013   CLINICAL DATA:  Progressively worsening intermittent left lower quadrant abdominal pain. Previous cholecystectomy, bladder surgery, rectal tumor excision, rectocele repair, vaginal prolapse repair and abdominal hysterectomy.  EXAM: CT ABDOMEN AND PELVIS WITH CONTRAST  TECHNIQUE: Multidetector CT imaging of the abdomen and pelvis was performed using the standard protocol following bolus administration of intravenous contrast.  CONTRAST:  72mL OMNIPAQUE IOHEXOL 300 MG/ML SOLN, 123mL OMNIPAQUE IOHEXOL 300 MG/ML SOLN  COMPARISON:  05/11/2012.  FINDINGS: Stable diffuse low density of the liver relative to the spleen.  Cholecystectomy clips. Small umbilical hernia containing fat. Minimal soft tissue stranding adjacent to the proximal sigmoid colon at the location of a diverticulum. Additional smaller diverticula elsewhere in the colon. No extraluminal gas or fluid.  Normal appearing appendix. No enlarged lymph nodes. Surgically absent uterus. No adnexal masses. Normal appearing spleen, pancreas, adrenal glands, kidneys and urinary bladder. 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 Minimal dependent atelectasis or scarring at both lung bases, mildly improved. Minimal lumbar spine degenerative changes.  IMPRESSION: 1. Sigmoid diverticulosis and minimal proximal sigmoid diverticulitis without abscess. 2. Stable marked diffuse hepatic steatosis. 3. Small umbilical hernia containing fat.   Electronically Signed   By: Enrique Sack M.D.   On: 05/12/2013 18:38     EKG Interpretation None      MDM   Final diagnoses:  Diverticulitis    Pt with documented history of diverticulosis with LLQ pain, tender to palpation, will send for CT to eval acute diverticulitis. Labs done in triage reviewed and unremarkable.   I personally performed the services described in this documentation, which was scribed in my presence. The recorded information has been reviewed and is accurate.       Charles B. Karle Starch, MD 05/12/13 [phone]

## 2013-05-12 NOTE — Discharge Instructions (Signed)
Diverticulitis °A diverticulum is a small pouch or sac on the colon. Diverticulosis is the presence of these diverticula on the colon. Diverticulitis is the irritation (inflammation) or infection of diverticula. °CAUSES  °The colon and its diverticula contain bacteria. If food particles block the tiny opening to a diverticulum, the bacteria inside can grow and cause an increase in pressure. This leads to infection and inflammation and is called diverticulitis. °SYMPTOMS  °· Abdominal pain and tenderness. Usually, the pain is located on the left side of your abdomen. However, it could be located elsewhere. °· Fever. °· Bloating. °· Feeling sick to your stomach (nausea). °· Throwing up (vomiting). °· Abnormal stools. °DIAGNOSIS  °Your caregiver will take a history and perform a physical exam. Since many things can cause abdominal pain, other tests may be necessary. Tests may include: °· Blood tests. °· Urine tests. °· X-ray of the abdomen. °· CT scan of the abdomen. °Sometimes, surgery is needed to determine if diverticulitis or other conditions are causing your symptoms. °TREATMENT  °Most of the time, you can be treated without surgery. Treatment includes: °· Resting the bowels by only having liquids for a few days. As you improve, you will need to eat a low-fiber diet. °· Intravenous (IV) fluids if you are losing body fluids (dehydrated). °· Antibiotic medicines that treat infections may be given. °· Pain and nausea medicine, if needed. °· Surgery if the inflamed diverticulum has burst. °HOME CARE INSTRUCTIONS  °· Try a clear liquid diet (broth, tea, or water for as long as directed by your caregiver). You may then gradually begin a low-fiber diet as tolerated.  °A low-fiber diet is a diet with less than 10 grams of fiber. Choose the foods below to reduce fiber in the diet: °· White breads, cereals, rice, and pasta. °· Cooked fruits and vegetables or soft fresh fruits and vegetables without the skin. °· Ground or  well-cooked tender beef, ham, veal, lamb, pork, or poultry. °· Eggs and seafood. °· After your diverticulitis symptoms have improved, your caregiver may put you on a high-fiber diet. A high-fiber diet includes 14 grams of fiber for every 1000 calories consumed. For a standard 2000 calorie diet, you would need 28 grams of fiber. Follow these diet guidelines to help you increase the fiber in your diet. It is important to slowly increase the amount fiber in your diet to avoid gas, constipation, and bloating. °· Choose whole-grain breads, cereals, pasta, and brown rice. °· Choose fresh fruits and vegetables with the skin on. Do not overcook vegetables because the more vegetables are cooked, the more fiber is lost. °· Choose more nuts, seeds, legumes, dried peas, beans, and lentils. °· Look for food products that have greater than 3 grams of fiber per serving on the Nutrition Facts label. °· Take all medicine as directed by your caregiver. °· If your caregiver has given you a follow-up appointment, it is very important that you go. Not going could result in lasting (chronic) or permanent injury, pain, and disability. If there is any problem keeping the appointment, call to reschedule. °SEEK MEDICAL CARE IF:  °· Your pain does not improve. °· You have a hard time advancing your diet beyond clear liquids. °· Your bowel movements do not return to normal. °SEEK IMMEDIATE MEDICAL CARE IF:  °· Your pain becomes worse. °· You have an oral temperature above 102° F (38.9° C), not controlled by medicine. °· You have repeated vomiting. °· You have bloody or black, tarry stools. °·   Symptoms that brought you to your caregiver become worse or are not getting better. °MAKE SURE YOU:  °· Understand these instructions. °· Will watch your condition. °· Will get help right away if you are not doing well or get worse. °Document Released: 11/25/2004 Document Revised: 05/10/2011 Document Reviewed: 03/23/2010 °[employer]® Patient Information  ©2014 [employer]. ° °

## 2013-05-18 ENCOUNTER — Ambulatory Visit (INDEPENDENT_AMBULATORY_CARE_PROVIDER_SITE_OTHER): Payer: Medicare Other | Admitting: Family Medicine

## 2013-05-18 ENCOUNTER — Encounter: Payer: Self-pay | Admitting: Family Medicine

## 2013-05-18 VITALS — BP 116/67 | HR 96 | Temp 97.9°F | Ht 62.0 in | Wt 164.0 lb

## 2013-05-18 DIAGNOSIS — K5730 Diverticulosis of large intestine without perforation or abscess without bleeding: Secondary | ICD-10-CM

## 2013-05-18 DIAGNOSIS — F1921 Other psychoactive substance dependence, in remission: Secondary | ICD-10-CM

## 2013-05-18 DIAGNOSIS — K5732 Diverticulitis of large intestine without perforation or abscess without bleeding: Secondary | ICD-10-CM

## 2013-05-18 MED ORDER — METRONIDAZOLE 500 MG PO TABS: 500.0000 mg | ORAL_TABLET | Freq: Two times a day (BID) | ORAL | Status: DC

## 2013-05-18 MED ORDER — CIPROFLOXACIN HCL 500 MG PO TABS: 500.0000 mg | ORAL_TABLET | Freq: Two times a day (BID) | ORAL | Status: DC

## 2013-05-18 NOTE — Progress Notes (Signed)
Patient ID: Teresa [name], female   DOB: 08-12-1955, 58 y.o.   MRN: [id number] SUBJECTIVE: CC: Chief Complaint  Patient presents with  . Hospitalization Follow-up    follow up from [facility] ER --diverticulititis  wants tramadol says got  this from ER. also says has trouble swallowiing sees DR Henrene Pastor    HPI: Was treated in the ED at [facility] and found to have diverticulitis. She was given 1 week of antibiotics, cipro and flagyl. It finishes tomorrow. She still has some discomfort in the LLQ of the abdomen and would like tramadol for this. No fever, no nausea, no vomiting, no diarrhea.   records reviewed in EPIC.  Past Medical History  Diagnosis Date  . Diverticulosis 07-08-2005    colonoscopy  . Hiatal hernia 07-08-2005    EGD  . GERD (gastroesophageal reflux disease) 07-08-2005    EGD  . Irritable bowel syndrome   . Vocal cord polyp     history of  . Degeneration of intervertebral disc, site unspecified   . Osteoarthrosis, unspecified whether generalized or localized, lower leg   . Depressive disorder, not elsewhere classified   . Anxiety state, unspecified   . Full dentures   . Seizures 1990s    x 1, unknown cause; no seizures since  . Chronic back pain greater than 3 months duration   . Seasonal allergies   . Papilloma of breast 09/2011    left  . Asthma     prn inhaler  . Esophageal stricture    Past Surgical History  Procedure Laterality Date  . Cholecystectomy    . Bladder surgery      bladder tack  . 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 Cystoscopy  08/24/2011    Procedure: CYSTOSCOPY;  Surgeon: Reece Packer, MD;  Location: [facility];  Service: Urology;  Laterality: N/A;  Cystoscopy,  Rectocele Repair and Vault Prolapse Repair  . Rectocele repair  08/24/2011    Procedure: POSTERIOR REPAIR (RECTOCELE);  Surgeon: Reece Packer, MD;  Location: [facility];  Service: Urology;  Laterality: N/A;  . Vaginal prolapse repair  08/24/2011    Procedure: VAGINAL VAULT SUSPENSION;   Surgeon: Reece Packer, MD;  Location: [facility];  Service: Urology;  Laterality: N/A;  . Abdominal hysterectomy      partial  . Bilateral salpingoophorectomy    . Other surgical history      exc. vocal cord polyp  . Breast biopsy  11/03/2011    Procedure: BREAST BIOPSY WITH NEEDLE LOCALIZATION;  Surgeon: Harl Bowie, MD;  Location: [place];  Service: General;  Laterality: Left;  needle localized left breast biopsy   History   Social History  . Marital Status: Divorced    Spouse Name: N/A    Number of Children: 1  . Years of Education: N/A   Occupational History  . Disabled    Social History Main Topics  . Smoking status: Former Smoker    Quit date: 08/20/1991  . Smokeless tobacco: Never Used  . Alcohol Use: No  . Drug Use: No  . Sexual Activity: No   Other Topics Concern  . Not on file   Social History Narrative  . No narrative on file   Family History  Problem Relation Age of Onset  . Prostate cancer Father   . Stomach cancer Maternal Uncle   . Diabetes Maternal Grandmother   . Heart disease Paternal Grandmother   . Irritable bowel syndrome  Current Outpatient Prescriptions on File Prior to Visit  Medication Sig Dispense Refill  . albuterol (PROAIR HFA) 108 (90 BASE) MCG/ACT inhaler Inhale 2 puffs into the lungs every 6 (six) hours as needed. Wheezing and shortness of breath  18 g  2  . aspirin EC 81 MG tablet Take 81 mg by mouth every other day.       . budesonide-formoterol (SYMBICORT) 160-4.5 MCG/ACT inhaler Inhale 2 puffs into the lungs 2 (two) times daily.  1 Inhaler  3  . cetirizine (ZYRTEC) 10 MG tablet Take 1 tablet (10 mg total) by mouth every morning.  30 tablet  2  . cyclobenzaprine (FLEXERIL) 10 MG tablet Take 10 mg by mouth 3 (three) times daily as needed. spasms      . Fiber Complete TABS Take 1 tablet by mouth daily.        Marland Kitchen lubiprostone (AMITIZA) 8 MCG capsule Take 1 capsule (8 mcg total) by mouth 2 (two) times daily  with a meal.  60 capsule  3  . MAGNESIUM PO Take 1 tablet by mouth daily.       . montelukast (SINGULAIR) 10 MG tablet Take 1 tablet (10 mg total) by mouth at bedtime.  30 tablet  5  . pantoprazole (PROTONIX) 40 MG tablet Take 1 tablet (40 mg total) by mouth daily.  30 tablet  3  . ranitidine (ZANTAC) 75 MG tablet Take 150 mg by mouth daily.      . traMADol (ULTRAM) 50 MG tablet Take 1 tablet (50 mg total) by mouth every 6 (six) hours as needed.  15 tablet  0   No current facility-administered medications on file prior to visit.   Allergies  Allergen Reactions  . Arthrotec [Diclofenac-Misoprostol]   . Keflex [Cephalexin]   . Naproxen     Irritates stomach  . Prozac [Fluoxetine Hcl]     "makes me feel bad"  . Vicodin [Hydrocodone-Acetaminophen]   . Ambien [Zolpidem Tartrate] Other (See Comments)    COULDN'T FUNCTION, STAYED SLEEPY  . Gabapentin Other (See Comments)    COULDN'T FUNCTION, STAYED SLEEPY    There is no immunization history on file for this patient. Prior to Admission medications   Medication Sig Start Date End Date Taking? Authorizing Provider  albuterol (PROAIR HFA) 108 (90 BASE) MCG/ACT inhaler Inhale 2 puffs into the lungs every 6 (six) hours as needed. Wheezing and shortness of breath 03/08/13   Vernie Shanks, MD  aspirin EC 81 MG tablet Take 81 mg by mouth every other day.     Historical Provider, MD  budesonide-formoterol (SYMBICORT) 160-4.5 MCG/ACT inhaler Inhale 2 puffs into the lungs 2 (two) times daily. 03/27/13   Vernie Shanks, MD  cetirizine (ZYRTEC) 10 MG tablet Take 1 tablet (10 mg total) by mouth every morning. 10/03/12   Mary-Margaret Hassell Done, FNP  ciprofloxacin (CIPRO) 500 MG tablet Take 1 tablet (500 mg total) by mouth 2 (two) times daily. 05/18/13   Vernie Shanks, MD  cyclobenzaprine (FLEXERIL) 10 MG tablet Take 10 mg by mouth 3 (three) times daily as needed. spasms    Historical Provider, MD  Fiber Complete TABS Take 1 tablet by mouth daily.       Historical Provider, MD  lubiprostone (AMITIZA) 8 MCG capsule Take 1 capsule (8 mcg total) by mouth 2 (two) times daily with a meal. 12/07/12   Vernie Shanks, MD  MAGNESIUM PO Take 1 tablet by mouth daily.     Historical Provider, MD  metroNIDAZOLE (FLAGYL) 500 MG tablet Take 1 tablet (500 mg total) by mouth 2 (two) times daily. 05/18/13   Vernie Shanks, MD  montelukast (SINGULAIR) 10 MG tablet Take 1 tablet (10 mg total) by mouth at bedtime. 11/07/12   Erby Pian, FNP  pantoprazole (PROTONIX) 40 MG tablet Take 1 tablet (40 mg total) by mouth daily. 03/27/13   Vernie Shanks, MD  ranitidine (ZANTAC) 75 MG tablet Take 150 mg by mouth daily.    Historical Provider, MD  traMADol (ULTRAM) 50 MG tablet Take 1 tablet (50 mg total) by mouth every 6 (six) hours as needed. 05/12/13   Charles B. Karle Starch, MD     ROS: As above in the HPI. All other systems are stable or negative.  OBJECTIVE: APPEARANCE:  Patient in no acute distress.The patient appeared well nourished and normally developed. Acyanotic. Waist: VITAL SIGNS:BP 116/67  Pulse 96  Temp(Src) 97.9 F (36.6 C) (Oral)  Ht 5\' 2"  (1.575 m)  Wt 164 lb (74.39 kg)  BMI 29.99 kg/m2 WF obese. NAD  SKIN: warm and  Dry without overt rashes, tattoos and scars  HEAD and Neck: without JVD, Head and scalp: normal Eyes:No scleral icterus. Fundi normal, eye movements normal. Ears: Auricle normal, canal normal, Tympanic membranes normal, insufflation normal. Nose: normal Throat: normal Neck & thyroid: normal  CHEST & LUNGS: Chest wall: normal Lungs: Clear  CVS: Reveals the PMI to be normally located. Regular rhythm, First and Second Heart sounds are normal,  absence of murmurs, rubs or gallops. Peripheral vasculature: Radial pulses: normal Dorsal pedis pulses: normal Posterior pulses: normal  ABDOMEN:  Appearance: Obese soft Mild LLQ tenderness to palpation, no organomegaly, no masses, no Abdominal Aortic enlargement. No Guarding ,  no rebound. No Bruits. Bowel sounds: normal  RECTAL: N/A GU: N/A  EXTREMETIES: nonedematous.  MUSCULOSKELETAL:  Spine: normal Joints: intact  NEUROLOGIC: oriented to time,place and person; nonfocal. Strength is normal Sensory is normal Reflexes are normal Cranial Nerves are normal. Results for orders placed during the hospital encounter of 05/12/13  CBC WITH DIFFERENTIAL      Result Value Ref Range   WBC 9.9  4.0 - 10.5 K/uL   RBC 4.99  3.87 - 5.11 MIL/uL   Hemoglobin 13.5  12.0 - 15.0 g/dL   HCT 40.6  36.0 - 46.0 %   MCV 81.4  78.0 - 100.0 fL   MCH 27.1  26.0 - 34.0 pg   MCHC 33.3  30.0 - 36.0 g/dL   RDW 14.3  11.5 - 15.5 %   Platelets 172  150 - 400 K/uL   Neutrophils Relative % 71  43 - 77 %   Neutro Abs 7.0  1.7 - 7.7 K/uL   Lymphocytes Relative 21  12 - 46 %   Lymphs Abs 2.1  0.7 - 4.0 K/uL   Monocytes Relative 7  3 - 12 %   Monocytes Absolute 0.7  0.1 - 1.0 K/uL   Eosinophils Relative 1  0 - 5 %   Eosinophils Absolute 0.1  0.0 - 0.7 K/uL   Basophils Relative 0  0 - 1 %   Basophils Absolute 0.0  0.0 - 0.1 K/uL  COMPREHENSIVE METABOLIC PANEL      Result Value Ref Range   Sodium 141  137 - 147 mEq/L   Potassium 4.0  3.7 - 5.3 mEq/L   Chloride 103  96 - 112 mEq/L   CO2 27  19 - 32 mEq/L   Glucose, Bld 91  70 - 99 mg/dL   BUN 8  6 - 23 mg/dL   Creatinine, Ser 0.92  0.50 - 1.10 mg/dL   Calcium 9.3  8.4 - 10.5 mg/dL   Total Protein 7.7  6.0 - 8.3 g/dL   Albumin 3.7  3.5 - 5.2 g/dL   AST 29  0 - 37 U/L   ALT 40 (*) 0 - 35 U/L   Alkaline Phosphatase 94  39 - 117 U/L   Total Bilirubin 1.8 (*) 0.3 - 1.2 mg/dL   GFR calc non Af Amer 69 (*) >90 mL/min   GFR calc Af Amer 80 (*) >90 mL/min  URINALYSIS, ROUTINE W REFLEX MICROSCOPIC      Result Value Ref Range   Color, Urine YELLOW  YELLOW   APPearance CLEAR  CLEAR   Specific Gravity, Urine <1.005 (*) 1.005 - 1.030   pH 6.0  5.0 - 8.0   Glucose, UA NEGATIVE  NEGATIVE mg/dL   Hgb urine dipstick NEGATIVE  NEGATIVE    Bilirubin Urine NEGATIVE  NEGATIVE   Ketones, ur NEGATIVE  NEGATIVE mg/dL   Protein, ur NEGATIVE  NEGATIVE mg/dL   Urobilinogen, UA 0.2  0.0 - 1.0 mg/dL   Nitrite NEGATIVE  NEGATIVE   Leukocytes, UA NEGATIVE  NEGATIVE    ASSESSMENT: Diverticulitis of colon (without mention of hemorrhage) - Plan: metroNIDAZOLE (FLAGYL) 500 MG tablet, ciprofloxacin (CIPRO) 500 MG tablet  DRUG ABUSE, IN REMISSION  DIVERTICULOSIS, COLON  PLAN: Handout on diverticulitis in the AVS.  No orders of the defined types were placed in this encounter.   Meds ordered this encounter  Medications  . metroNIDAZOLE (FLAGYL) 500 MG tablet    Sig: Take 1 tablet (500 mg total) by mouth 2 (two) times daily.    Dispense:  14 tablet    Refill:  0  . ciprofloxacin (CIPRO) 500 MG tablet    Sig: Take 1 tablet (500 mg total) by mouth 2 (two) times daily.    Dispense:  14 tablet    Refill:  0  denied the tramadol due to family h/o of substance abuse and also patient's past history.  Medications Discontinued During This Encounter  Medication Reason  . metroNIDAZOLE (FLAGYL) 500 MG tablet Reorder  . ciprofloxacin (CIPRO) 500 MG tablet Reorder   Return in about 1 week (around 05/25/2013) for recheck diverticulitis.  Nikaya Nasby P. Jacelyn Grip, M.D.

## 2013-05-18 NOTE — Patient Instructions (Signed)
Diverticulitis °A diverticulum is a small pouch or sac on the colon. Diverticulosis is the presence of these diverticula on the colon. Diverticulitis is the irritation (inflammation) or infection of diverticula. °CAUSES  °The colon and its diverticula contain bacteria. If food particles block the tiny opening to a diverticulum, the bacteria inside can grow and cause an increase in pressure. This leads to infection and inflammation and is called diverticulitis. °SYMPTOMS  °· Abdominal pain and tenderness. Usually, the pain is located on the left side of your abdomen. However, it could be located elsewhere. °· Fever. °· Bloating. °· Feeling sick to your stomach (nausea). °· Throwing up (vomiting). °· Abnormal stools. °DIAGNOSIS  °Your caregiver will take a history and perform a physical exam. Since many things can cause abdominal pain, other tests may be necessary. Tests may include: °· Blood tests. °· Urine tests. °· X-ray of the abdomen. °· CT scan of the abdomen. °Sometimes, surgery is needed to determine if diverticulitis or other conditions are causing your symptoms. °TREATMENT  °Most of the time, you can be treated without surgery. Treatment includes: °· Resting the bowels by only having liquids for a few days. As you improve, you will need to eat a low-fiber diet. °· Intravenous (IV) fluids if you are losing body fluids (dehydrated). °· Antibiotic medicines that treat infections may be given. °· Pain and nausea medicine, if needed. °· Surgery if the inflamed diverticulum has burst. °HOME CARE INSTRUCTIONS  °· Try a clear liquid diet (broth, tea, or water for as long as directed by your caregiver). You may then gradually begin a low-fiber diet as tolerated.  °A low-fiber diet is a diet with less than 10 grams of fiber. Choose the foods below to reduce fiber in the diet: °· White breads, cereals, rice, and pasta. °· Cooked fruits and vegetables or soft fresh fruits and vegetables without the skin. °· Ground or  well-cooked tender beef, ham, veal, lamb, pork, or poultry. °· Eggs and seafood. °· After your diverticulitis symptoms have improved, your caregiver may put you on a high-fiber diet. A high-fiber diet includes 14 grams of fiber for every 1000 calories consumed. For a standard 2000 calorie diet, you would need 28 grams of fiber. Follow these diet guidelines to help you increase the fiber in your diet. It is important to slowly increase the amount fiber in your diet to avoid gas, constipation, and bloating. °· Choose whole-grain breads, cereals, pasta, and brown rice. °· Choose fresh fruits and vegetables with the skin on. Do not overcook vegetables because the more vegetables are cooked, the more fiber is lost. °· Choose more nuts, seeds, legumes, dried peas, beans, and lentils. °· Look for food products that have greater than 3 grams of fiber per serving on the Nutrition Facts label. °· Take all medicine as directed by your caregiver. °· If your caregiver has given you a follow-up appointment, it is very important that you go. Not going could result in lasting (chronic) or permanent injury, pain, and disability. If there is any problem keeping the appointment, call to reschedule. °SEEK MEDICAL CARE IF:  °· Your pain does not improve. °· You have a hard time advancing your diet beyond clear liquids. °· Your bowel movements do not return to normal. °SEEK IMMEDIATE MEDICAL CARE IF:  °· Your pain becomes worse. °· You have an oral temperature above 102° F (38.9° C), not controlled by medicine. °· You have repeated vomiting. °· You have bloody or black, tarry stools. °·   Symptoms that brought you to your caregiver become worse or are not getting better. °MAKE SURE YOU:  °· Understand these instructions. °· Will watch your condition. °· Will get help right away if you are not doing well or get worse. °Document Released: 11/25/2004 Document Revised: 05/10/2011 Document Reviewed: 03/23/2010 °[employer]® Patient Information  ©2014 [employer]. ° °

## 2013-05-22 ENCOUNTER — Telehealth: Payer: Self-pay | Admitting: Family Medicine

## 2013-05-22 NOTE — Telephone Encounter (Signed)
appt for tomorrow with Kathi Der

## 2013-05-23 ENCOUNTER — Encounter: Payer: Self-pay | Admitting: Family Medicine

## 2013-05-23 ENCOUNTER — Ambulatory Visit (INDEPENDENT_AMBULATORY_CARE_PROVIDER_SITE_OTHER): Payer: Medicare Other | Admitting: Family Medicine

## 2013-05-23 ENCOUNTER — Ambulatory Visit: Payer: Medicare Other | Admitting: Family Medicine

## 2013-05-23 ENCOUNTER — Ambulatory Visit (INDEPENDENT_AMBULATORY_CARE_PROVIDER_SITE_OTHER): Payer: Medicare Other

## 2013-05-23 VITALS — BP 125/79 | HR 105 | Temp 97.4°F | Ht 62.0 in | Wt 163.0 lb

## 2013-05-23 DIAGNOSIS — M25519 Pain in unspecified shoulder: Secondary | ICD-10-CM

## 2013-05-23 DIAGNOSIS — M542 Cervicalgia: Secondary | ICD-10-CM

## 2013-05-23 DIAGNOSIS — S139XXA Sprain of joints and ligaments of unspecified parts of neck, initial encounter: Secondary | ICD-10-CM | POA: Diagnosis not present

## 2013-05-23 DIAGNOSIS — S161XXA Strain of muscle, fascia and tendon at neck level, initial encounter: Secondary | ICD-10-CM

## 2013-05-23 MED ORDER — CYCLOBENZAPRINE HCL 10 MG PO TABS: 10.0000 mg | ORAL_TABLET | Freq: Three times a day (TID) | ORAL | Status: DC | PRN

## 2013-05-23 MED ORDER — PREDNISONE 50 MG PO TABS: ORAL_TABLET | ORAL | Status: DC
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
## 2013-05-24 ENCOUNTER — Telehealth: Payer: Self-pay | Admitting: Family Medicine

## 2013-05-24 NOTE — Telephone Encounter (Signed)
I did not see documentation of possible Tramadol prescription. Explained that prednisone will help with inflammation and that flexeril will help relax the muscles and ease their soreness. Also reiterated to use RICE. She will follow up as needed.

## 2013-05-25 ENCOUNTER — Encounter: Payer: Self-pay | Admitting: Family Medicine

## 2013-05-25 ENCOUNTER — Ambulatory Visit (INDEPENDENT_AMBULATORY_CARE_PROVIDER_SITE_OTHER): Payer: Medicare Other | Admitting: Family Medicine

## 2013-05-25 VITALS — BP 126/74 | HR 114 | Temp 97.5°F | Wt 163.0 lb

## 2013-05-25 DIAGNOSIS — K5730 Diverticulosis of large intestine without perforation or abscess without bleeding: Secondary | ICD-10-CM

## 2013-05-25 DIAGNOSIS — K5732 Diverticulitis of large intestine without perforation or abscess without bleeding: Secondary | ICD-10-CM | POA: Diagnosis not present

## 2013-05-25 NOTE — Progress Notes (Signed)
Patient ID: Teresa [name], female   DOB: [birth year], 58 y.o.   MRN: [id number] SUBJECTIVE: CC: Chief Complaint  Patient presents with  . Follow-up    reck diverticulitis    HPI: Here to recheck her diverticulitis. Has another day of antibiotics. Stools normal now. Doing better. No fever , did well with the antibiotics.   Past Medical History  Diagnosis Date  . Diverticulosis 07-08-2005    colonoscopy  . Hiatal hernia 07-08-2005    EGD  . GERD (gastroesophageal reflux disease) 07-08-2005    EGD  . Irritable bowel syndrome   . Vocal cord polyp     history of  . Degeneration of intervertebral disc, site unspecified   . Osteoarthrosis, unspecified whether generalized or localized, lower leg   . Depressive disorder, not elsewhere classified   . Anxiety state, unspecified   . Full dentures   . Seizures 1990s    x 1, unknown cause; no seizures since  . Chronic back pain greater than 3 months duration   . Seasonal allergies   . Papilloma of breast 09/2011    left  . Asthma     prn inhaler  . Esophageal stricture    Past Surgical History  Procedure Laterality Date  . Cholecystectomy    . Bladder surgery      bladder tack  . 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 Cystoscopy  08/24/2011    Procedure: CYSTOSCOPY;  Surgeon: Reece Packer, MD;  Location: [facility];  Service: Urology;  Laterality: N/A;  Cystoscopy,  Rectocele Repair and Vault Prolapse Repair  . Rectocele repair  08/24/2011    Procedure: POSTERIOR REPAIR (RECTOCELE);  Surgeon: Reece Packer, MD;  Location: [facility];  Service: Urology;  Laterality: N/A;  . Vaginal prolapse repair  08/24/2011    Procedure: VAGINAL VAULT SUSPENSION;  Surgeon: Reece Packer, MD;  Location: [facility];  Service: Urology;  Laterality: N/A;  . Abdominal hysterectomy      partial  . Bilateral salpingoophorectomy    . Other surgical history      exc. vocal cord polyp  . Breast biopsy  11/03/2011    Procedure: BREAST BIOPSY WITH NEEDLE  LOCALIZATION;  Surgeon: Harl Bowie, MD;  Location: [facility];  Service: General;  Laterality: Left;  needle localized left breast biopsy   History   Social History  . Marital Status: Divorced    Spouse Name: N/A    Number of Children: 1  . Years of Education: N/A   Occupational History  . Disabled    Social History Main Topics  . Smoking status: Former Smoker    Quit date: 08/20/1991  . Smokeless tobacco: Never Used  . Alcohol Use: No  . Drug Use: No  . Sexual Activity: No   Other Topics Concern  . Not on file   Social History Narrative  . No narrative on file   Family History  Problem Relation Age of Onset  . Prostate cancer Father   . Stomach cancer Maternal Uncle   . Diabetes Maternal Grandmother   . Heart disease Paternal Grandmother   . Irritable bowel syndrome     Current Outpatient Prescriptions on File Prior to Visit  Medication Sig Dispense Refill  . albuterol (PROAIR HFA) 108 (90 BASE) MCG/ACT inhaler Inhale 2 puffs into the lungs every 6 (six) hours as needed. Wheezing and shortness of breath  18 g  2  . aspirin EC 81 MG tablet Take  81 mg by mouth every other day.       . budesonide-formoterol (SYMBICORT) 160-4.5 MCG/ACT inhaler Inhale 2 puffs into the lungs 2 (two) times daily.  1 Inhaler  3  . cetirizine (ZYRTEC) 10 MG tablet Take 1 tablet (10 mg total) by mouth every morning.  30 tablet  2  . ciprofloxacin (CIPRO) 500 MG tablet Take 1 tablet (500 mg total) by mouth 2 (two) times daily.  14 tablet  0  . cyclobenzaprine (FLEXERIL) 10 MG tablet Take 1 tablet (10 mg total) by mouth 3 (three) times daily as needed. spasms  30 tablet  0  . Fiber Complete TABS Take 1 tablet by mouth daily.        Marland Kitchen lubiprostone (AMITIZA) 8 MCG capsule Take 1 capsule (8 mcg total) by mouth 2 (two) times daily with a meal.  60 capsule  3  . MAGNESIUM PO Take 1 tablet by mouth daily.       . metroNIDAZOLE (FLAGYL) 500 MG tablet Take 1 tablet (500 mg total) by  mouth 2 (two) times daily.  14 tablet  0  . montelukast (SINGULAIR) 10 MG tablet Take 1 tablet (10 mg total) by mouth at bedtime.  30 tablet  5  . pantoprazole (PROTONIX) 40 MG tablet Take 1 tablet (40 mg total) by mouth daily.  30 tablet  3  . predniSONE (DELTASONE) 50 MG tablet 1 tab daily x 5 days  5 tablet  0  . ranitidine (ZANTAC) 75 MG tablet Take 150 mg by mouth daily.      . traMADol (ULTRAM) 50 MG tablet Take 1 tablet (50 mg total) by mouth every 6 (six) hours as needed.  15 tablet  0   No current facility-administered medications on file prior to visit.   Allergies  Allergen Reactions  . Arthrotec [Diclofenac-Misoprostol]   . Keflex [Cephalexin]   . Naproxen     Irritates stomach  . Prozac [Fluoxetine Hcl]     "makes me feel bad"  . Vicodin [Hydrocodone-Acetaminophen]   . Ambien [Zolpidem Tartrate] Other (See Comments)    COULDN'T FUNCTION, STAYED SLEEPY  . Gabapentin Other (See Comments)    COULDN'T FUNCTION, STAYED SLEEPY    There is no immunization history on file for this patient. Prior to Admission medications   Medication Sig Start Date End Date Taking? Authorizing Provider  albuterol (PROAIR HFA) 108 (90 BASE) MCG/ACT inhaler Inhale 2 puffs into the lungs every 6 (six) hours as needed. Wheezing and shortness of breath 03/08/13   Vernie Shanks, MD  aspirin EC 81 MG tablet Take 81 mg by mouth every other day.     Historical Provider, MD  budesonide-formoterol (SYMBICORT) 160-4.5 MCG/ACT inhaler Inhale 2 puffs into the lungs 2 (two) times daily. 03/27/13   Vernie Shanks, MD  cetirizine (ZYRTEC) 10 MG tablet Take 1 tablet (10 mg total) by mouth every morning. 10/03/12   Mary-Margaret Hassell Done, FNP  ciprofloxacin (CIPRO) 500 MG tablet Take 1 tablet (500 mg total) by mouth 2 (two) times daily. 05/18/13   Vernie Shanks, MD  cyclobenzaprine (FLEXERIL) 10 MG tablet Take 1 tablet (10 mg total) by mouth 3 (three) times daily as needed. spasms 05/23/13   Shanda Howells, MD  Fiber  Complete TABS Take 1 tablet by mouth daily.      Historical Provider, MD  lubiprostone (AMITIZA) 8 MCG capsule Take 1 capsule (8 mcg total) by mouth 2 (two) times daily with a meal. 12/07/12   Dub Mikes  Darylene Price, MD  MAGNESIUM PO Take 1 tablet by mouth daily.     Historical Provider, MD  metroNIDAZOLE (FLAGYL) 500 MG tablet Take 1 tablet (500 mg total) by mouth 2 (two) times daily. 05/18/13   Vernie Shanks, MD  montelukast (SINGULAIR) 10 MG tablet Take 1 tablet (10 mg total) by mouth at bedtime. 11/07/12   Erby Pian, FNP  pantoprazole (PROTONIX) 40 MG tablet Take 1 tablet (40 mg total) by mouth daily. 03/27/13   Vernie Shanks, MD  predniSONE (DELTASONE) 50 MG tablet 1 tab daily x 5 days 05/23/13   Shanda Howells, MD  ranitidine (ZANTAC) 75 MG tablet Take 150 mg by mouth daily.    Historical Provider, MD  traMADol (ULTRAM) 50 MG tablet Take 1 tablet (50 mg total) by mouth every 6 (six) hours as needed. 05/12/13   Charles B. Karle Starch, MD     ROS: As above in the HPI. All other systems are stable or negative.  OBJECTIVE: APPEARANCE:  Patient in no acute distress.The patient appeared well nourished and normally developed. Acyanotic. Waist: VITAL SIGNS:BP 126/74  Pulse 114  Temp(Src) 97.5 F (36.4 C) (Oral)  Wt 163 lb (73.936 kg) WF  SKIN: warm and  Dry without overt rashes, tattoos and scars  HEAD and Neck: without JVD, Head and scalp: normal Eyes:No scleral icterus. Fundi normal, eye movements normal. Ears: Auricle normal, canal normal, Tympanic membranes normal, insufflation normal. Nose: normal Throat: normal Neck & thyroid: normal  CHEST & LUNGS: Chest wall: normal Lungs: Clear  CVS: Reveals the PMI to be normally located. Regular rhythm, First and Second Heart sounds are normal,  absence of murmurs, rubs or gallops. Peripheral vasculature: Radial pulses: normal Dorsal pedis pulses: normal Posterior pulses: normal  ABDOMEN:  Appearance: Obese soft. Non tender Benign,  no organomegaly, no masses, no Abdominal Aortic enlargement. No Guarding , no rebound. No Bruits. Bowel sounds: normal  RECTAL: N/A GU: N/A  EXTREMETIES: nonedematous.  NEUROLOGIC: oriented to time,place and person; nonfocal.  ASSESSMENT: DIVERTICULOSIS, COLON  Diverticulitis of colon (without mention of hemorrhage) - resolving Improved and the 2 courses of antibiotics should be adequate.   PLAN: Complete the antibiotics. High fiber diet. No orders of the defined types were placed in this encounter.   No orders of the defined types were placed in this encounter.   There are no discontinued medications. Return if symptoms worsen or fail to improve.  Leylah Tarnow P. Jacelyn Grip, M.D.

## 2013-06-07 ENCOUNTER — Ambulatory Visit: Payer: Medicare Other | Admitting: Family Medicine

## 2013-06-08 ENCOUNTER — Ambulatory Visit: Payer: Medicare Other | Admitting: Family Medicine

## 2013-06-21 ENCOUNTER — Emergency Department (HOSPITAL_COMMUNITY): Payer: Medicare Other

## 2013-06-21 ENCOUNTER — Encounter (HOSPITAL_COMMUNITY): Payer: Self-pay | Admitting: Emergency Medicine

## 2013-06-21 ENCOUNTER — Ambulatory Visit (INDEPENDENT_AMBULATORY_CARE_PROVIDER_SITE_OTHER): Payer: Medicare Other

## 2013-06-21 ENCOUNTER — Telehealth: Payer: Self-pay | Admitting: Family Medicine

## 2013-06-21 ENCOUNTER — Ambulatory Visit (INDEPENDENT_AMBULATORY_CARE_PROVIDER_SITE_OTHER): Payer: Medicare Other | Admitting: Nurse Practitioner

## 2013-06-21 ENCOUNTER — Emergency Department (HOSPITAL_COMMUNITY)
Admission: EM | Admit: 2013-06-21 | Discharge: 2013-06-21 | Disposition: A | Discharge status: Home / Self Care | Payer: Medicare Other | Attending: Emergency Medicine | Admitting: Emergency Medicine

## 2013-06-21 VITALS — BP 145/92 | HR 110 | Temp 98.1°F | Ht 62.4 in | Wt 164.8 lb

## 2013-06-21 DIAGNOSIS — Z87898 Personal history of other specified conditions: Secondary | ICD-10-CM | POA: Insufficient documentation

## 2013-06-21 DIAGNOSIS — Z9889 Other specified postprocedural states: Secondary | ICD-10-CM | POA: Diagnosis not present

## 2013-06-21 DIAGNOSIS — Z7982 Long term (current) use of aspirin: Secondary | ICD-10-CM | POA: Insufficient documentation

## 2013-06-21 DIAGNOSIS — K5792 Diverticulitis of intestine, part unspecified, without perforation or abscess without bleeding: Secondary | ICD-10-CM

## 2013-06-21 DIAGNOSIS — R1084 Generalized abdominal pain: Secondary | ICD-10-CM

## 2013-06-21 DIAGNOSIS — R52 Pain, unspecified: Secondary | ICD-10-CM

## 2013-06-21 DIAGNOSIS — K589 Irritable bowel syndrome without diarrhea: Secondary | ICD-10-CM | POA: Insufficient documentation

## 2013-06-21 DIAGNOSIS — K219 Gastro-esophageal reflux disease without esophagitis: Secondary | ICD-10-CM | POA: Diagnosis not present

## 2013-06-21 DIAGNOSIS — M549 Dorsalgia, unspecified: Secondary | ICD-10-CM | POA: Insufficient documentation

## 2013-06-21 DIAGNOSIS — N390 Urinary tract infection, site not specified: Secondary | ICD-10-CM

## 2013-06-21 DIAGNOSIS — K5732 Diverticulitis of large intestine without perforation or abscess without bleeding: Secondary | ICD-10-CM | POA: Diagnosis not present

## 2013-06-21 DIAGNOSIS — Z79899 Other long term (current) drug therapy: Secondary | ICD-10-CM | POA: Insufficient documentation

## 2013-06-21 DIAGNOSIS — IMO0002 Reserved for concepts with insufficient information to code with codable children: Secondary | ICD-10-CM | POA: Diagnosis not present

## 2013-06-21 DIAGNOSIS — Z9079 Acquired absence of other genital organ(s): Secondary | ICD-10-CM | POA: Insufficient documentation

## 2013-06-21 DIAGNOSIS — Z8669 Personal history of other diseases of the nervous system and sense organs: Secondary | ICD-10-CM | POA: Diagnosis not present

## 2013-06-21 DIAGNOSIS — Z8659 Personal history of other mental and behavioral disorders: Secondary | ICD-10-CM | POA: Insufficient documentation

## 2013-06-21 DIAGNOSIS — K7689 Other specified diseases of liver: Secondary | ICD-10-CM | POA: Diagnosis not present

## 2013-06-21 DIAGNOSIS — Z87891 Personal history of nicotine dependence: Secondary | ICD-10-CM | POA: Diagnosis not present

## 2013-06-21 DIAGNOSIS — J45909 Unspecified asthma, uncomplicated: Secondary | ICD-10-CM | POA: Diagnosis not present

## 2013-06-21 DIAGNOSIS — Z8709 Personal history of other diseases of the respiratory system: Secondary | ICD-10-CM | POA: Diagnosis not present

## 2013-06-21 DIAGNOSIS — Z8742 Personal history of other diseases of the female genital tract: Secondary | ICD-10-CM | POA: Insufficient documentation

## 2013-06-21 DIAGNOSIS — M1710 Unilateral primary osteoarthritis, unspecified knee: Secondary | ICD-10-CM | POA: Insufficient documentation

## 2013-06-21 DIAGNOSIS — G8929 Other chronic pain: Secondary | ICD-10-CM | POA: Diagnosis not present

## 2013-06-21 DIAGNOSIS — R1032 Left lower quadrant pain: Secondary | ICD-10-CM | POA: Diagnosis not present

## 2013-06-21 LAB — CBC WITH DIFFERENTIAL/PLATELET
BASOS ABS: 0 10*3/uL (ref 0.0–0.1)
Basophils Relative: 0 % (ref 0–1)
Eosinophils Absolute: 0.1 10*3/uL (ref 0.0–0.7)
Eosinophils Relative: 1 % (ref 0–5)
HEMATOCRIT: 40.9 % (ref 36.0–46.0)
HEMOGLOBIN: 13.3 g/dL (ref 12.0–15.0)
LYMPHS ABS: 1.7 10*3/uL (ref 0.7–4.0)
Lymphocytes Relative: 19 % (ref 12–46)
MCH: 26.4 pg (ref 26.0–34.0)
MCHC: 32.5 g/dL (ref 30.0–36.0)
MCV: 81.3 fL (ref 78.0–100.0)
MONOS PCT: 7 % (ref 3–12)
Monocytes Absolute: 0.7 10*3/uL (ref 0.1–1.0)
NEUTROS ABS: 6.7 10*3/uL (ref 1.7–7.7)
Neutrophils Relative %: 73 % (ref 43–77)
Platelets: 218 10*3/uL (ref 150–400)
RBC: 5.03 MIL/uL (ref 3.87–5.11)
RDW: 14.6 % (ref 11.5–15.5)
WBC: 9.1 10*3/uL (ref 4.0–10.5)

## 2013-06-21 LAB — POCT UA - MICROSCOPIC ONLY
Bacteria, U Microscopic: NEGATIVE
Casts, Ur, LPF, POC: NEGATIVE
Crystals, Ur, HPF, POC: NEGATIVE
MUCUS UA: NEGATIVE
RBC, URINE, MICROSCOPIC: NEGATIVE
YEAST UA: NEGATIVE

## 2013-06-21 LAB — POCT URINALYSIS DIPSTICK
BILIRUBIN UA: NEGATIVE
Blood, UA: NEGATIVE
GLUCOSE UA: NEGATIVE
KETONES UA: NEGATIVE
Nitrite, UA: NEGATIVE
Protein, UA: NEGATIVE
Spec Grav, UA: <=1.005
Urobilinogen, UA: NEGATIVE
pH, UA: 6.5

## 2013-06-21 LAB — POCT CBC
GRANULOCYTE PERCENT: 76.6 % (ref 37–80)
HCT, POC: 41.7 % (ref 37.7–47.9)
HEMOGLOBIN: 13.4 g/dL (ref 12.2–16.2)
LYMPH, POC: 2 (ref 0.6–3.4)
MCH, POC: 25.6 pg — AB (ref 27–31.2)
MCHC: 32.1 g/dL (ref 31.8–35.4)
MCV: 80 fL (ref 80–97)
MPV: 7.3 fL (ref 0–99.8)
POC Granulocyte: 7.9 — AB (ref 2–6.9)
POC LYMPH PERCENT: 19.8 %L (ref 10–50)
Platelet Count, POC: 210 10*3/uL (ref 142–424)
RBC: 5.2 M/uL (ref 4.04–5.48)
RDW, POC: 14.8 %
WBC: 10.3 10*3/uL — AB (ref 4.6–10.2)

## 2013-06-21 LAB — BASIC METABOLIC PANEL
BUN: 8 mg/dL (ref 6–23)
CHLORIDE: 101 meq/L (ref 96–112)
CO2: 28 meq/L (ref 19–32)
Calcium: 9.2 mg/dL (ref 8.4–10.5)
Creatinine, Ser: 0.73 mg/dL (ref 0.50–1.10)
GFR calc non Af Amer: >90 mL/min (ref >90)
Glucose, Bld: 128 mg/dL — ABNORMAL HIGH (ref 70–99)
POTASSIUM: 3.9 meq/L (ref 3.7–5.3)
Sodium: 141 mEq/L (ref 137–147)

## 2013-06-21 MED ORDER — ONDANSETRON HCL 4 MG/2ML IJ SOLN
4.0000 mg | Freq: Once | INTRAMUSCULAR | Status: AC
  Administered 2013-06-21: 4 mg via INTRAVENOUS
  Filled 2013-06-21: qty 2

## 2013-06-21 MED ORDER — HYDROMORPHONE HCL PF 1 MG/ML IJ SOLN
1.0000 mg | Freq: Once | INTRAMUSCULAR | Status: AC
  Administered 2013-06-21: 1 mg via INTRAVENOUS
  Filled 2013-06-21: qty 1

## 2013-06-21 MED ORDER — IOHEXOL 300 MG/ML  SOLN
100.0000 mL | Freq: Once | INTRAMUSCULAR | Status: AC | PRN
  Administered 2013-06-21: 100 mL via INTRAVENOUS

## 2013-06-21 MED ORDER — ONDANSETRON HCL 4 MG PO TABS: 4.0000 mg | ORAL_TABLET | Freq: Three times a day (TID) | ORAL | Status: DC | PRN

## 2013-06-21 MED ORDER — METRONIDAZOLE IN NACL 5-0.79 MG/ML-% IV SOLN
500.0000 mg | Freq: Once | INTRAVENOUS | Status: AC
  Administered 2013-06-21: 500 mg via INTRAVENOUS
  Filled 2013-06-21: qty 100

## 2013-06-21 MED ORDER — CIPROFLOXACIN IN D5W 400 MG/200ML IV SOLN
400.0000 mg | Freq: Once | INTRAVENOUS | Status: AC
  Administered 2013-06-21: 400 mg via INTRAVENOUS
  Filled 2013-06-21: qty 200

## 2013-06-21 MED ORDER — CIPROFLOXACIN HCL 500 MG PO TABS: 500.0000 mg | ORAL_TABLET | Freq: Two times a day (BID) | ORAL | Status: DC

## 2013-06-21 MED ORDER — METRONIDAZOLE 500 MG PO TABS: 500.0000 mg | ORAL_TABLET | Freq: Two times a day (BID) | ORAL | Status: DC

## 2013-06-21 MED ORDER — HYDROCODONE-ACETAMINOPHEN 5-325 MG PO TABS: 1.0000 | ORAL_TABLET | Freq: Four times a day (QID) | ORAL | Status: DC | PRN
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
## 2013-06-21 NOTE — ED Provider Notes (Signed)
CSN: [id number]     Arrival date & time 06/21/13  1357 History  This chart was scribed for Teresa Diego, MD by Zettie Pho, ED Scribe. This patient was seen in room APA17/APA17 and the patient's care was started at 4:43 PM.    Chief Complaint  Patient presents with  . Abdominal Pain   Patient is a 58 y.o. female presenting with abdominal pain. The history is provided by the patient. No language interpreter was used.  Abdominal Pain Pain location:  Suprapubic Pain severity:  Moderate Onset quality:  Gradual Timing:  Intermittent Progression:  Worsening Chronicity:  New Context: previous surgery   Relieved by:  Nothing Worsened by:  Nothing tried Ineffective treatments:  None tried Associated symptoms: chills and nausea   Associated symptoms: no chest pain, no cough, no diarrhea, no fatigue and no hematuria   Nausea:    Severity:  Moderate   Onset quality:  Gradual   Timing:  Intermittent   Progression:  Worsening Risk factors: multiple surgeries    HPI Comments: Teresa PITTINGER is a 58 y.o. female with a history of diverticulosis, GERD, hiatal hernia, IBS, and esophageal stricture who presents to the Emergency Department complaining of an intermittent pain to the suprapubic region of the abdomen onset a few days ago that she states began progressively worsening last night. She reports associated nausea and chills. Patient reports that she was seen by her PCP earlier today for similar complaints and received an abdominal x-ray that was unremarkable and patient was treated for a UTI with antibiotics, but states that she did not receive any pain medication. Patient has a surgical history of cholecystectomy, bladder tack, cystoscopy, rectocele repair, vaginal prolapse repair, abdominal hysterectomy, and bilateral salpingoophorectomy. Patient has allergies to arthrotec, Keflex, Naproxen, Prozac, Vicodin, Ambien, and Gabapentin. Patient also has a history of seizures and asthma.   Past  Medical History  Diagnosis Date  . Diverticulosis 07-08-2005    colonoscopy  . Hiatal hernia 07-08-2005    EGD  . GERD (gastroesophageal reflux disease) 07-08-2005    EGD  . Irritable bowel syndrome   . Vocal cord polyp     history of  . Degeneration of intervertebral disc, site unspecified   . Osteoarthrosis, unspecified whether generalized or localized, lower leg   . Depressive disorder, not elsewhere classified   . Anxiety state, unspecified   . Full dentures   . Seizures 1990s    x 1, unknown cause; no seizures since  . Chronic back pain greater than 3 months duration   . Seasonal allergies   . Papilloma of breast 09/2011    left  . Asthma     prn inhaler  . Esophageal stricture    Past Surgical History  Procedure Laterality Date  . Cholecystectomy    . Bladder surgery      bladder tack  . 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 Cystoscopy  08/24/2011    Procedure: CYSTOSCOPY;  Surgeon: Reece Packer, MD;  Location: [facility];  Service: Urology;  Laterality: N/A;  Cystoscopy,  Rectocele Repair and Vault Prolapse Repair  . Rectocele repair  08/24/2011    Procedure: POSTERIOR REPAIR (RECTOCELE);  Surgeon: Reece Packer, MD;  Location: [facility];  Service: Urology;  Laterality: N/A;  . Vaginal prolapse repair  08/24/2011    Procedure: VAGINAL VAULT SUSPENSION;  Surgeon: Reece Packer, MD;  Location: [facility];  Service: Urology;  Laterality: N/A;  . Abdominal hysterectomy  partial  . Bilateral salpingoophorectomy    . Other surgical history      exc. vocal cord polyp  . Breast biopsy  11/03/2011    Procedure: BREAST BIOPSY WITH NEEDLE LOCALIZATION;  Surgeon: Harl Bowie, MD;  Location: [facility];  Service: General;  Laterality: Left;  needle localized left breast biopsy   Family History  Problem Relation Age of Onset  . Prostate cancer Father   . Stomach cancer Maternal Uncle   . Diabetes Maternal Grandmother   . Heart disease Paternal  Grandmother   . Irritable bowel syndrome     History  Substance Use Topics  . Smoking status: Former Smoker    Quit date: 08/20/1991  . Smokeless tobacco: Never Used  . Alcohol Use: No   OB History   Grav Para Term Preterm Abortions TAB SAB Ect Mult Living                 Review of Systems  Constitutional: Positive for chills. Negative for appetite change and fatigue.  HENT: Negative for congestion, ear discharge and sinus pressure.   Eyes: Negative for discharge.  Respiratory: Negative for cough.   Cardiovascular: Negative for chest pain.  Gastrointestinal: Positive for nausea and abdominal pain. Negative for diarrhea.  Genitourinary: Negative for frequency and hematuria.  Musculoskeletal: Negative for back pain.  Skin: Negative for rash.  Neurological: Negative for seizures and headaches.  Psychiatric/Behavioral: Negative for hallucinations.      Allergies  Arthrotec; Keflex; Naproxen; Prozac; Vicodin; Ambien; and Gabapentin  Home Medications   Prior to Admission medications   Medication Sig Start Date End Date Taking? Authorizing Provider  albuterol (PROAIR HFA) 108 (90 BASE) MCG/ACT inhaler Inhale 2 puffs into the lungs every 6 (six) hours as needed. Wheezing and shortness of breath 03/08/13  Yes Vernie Shanks, MD  aspirin EC 81 MG tablet Take 81 mg by mouth every other day.    Yes Historical Provider, MD  budesonide-formoterol (SYMBICORT) 160-4.5 MCG/ACT inhaler Inhale 2 puffs into the lungs 2 (two) times daily. 03/27/13  Yes Vernie Shanks, MD  cetirizine (ZYRTEC) 10 MG tablet Take 1 tablet (10 mg total) by mouth every morning. 10/03/12  Yes Mary-Margaret Hassell Done, FNP  ciprofloxacin (CIPRO) 500 MG tablet Take 1 tablet (500 mg total) by mouth 2 (two) times daily. 06/21/13  Yes Mary-Margaret Hassell Done, FNP  cyclobenzaprine (FLEXERIL) 10 MG tablet Take 1 tablet (10 mg total) by mouth 3 (three) times daily as needed. spasms 05/23/13  Yes Shanda Howells, MD  Fiber Complete TABS Take  1 tablet by mouth daily.     Yes Historical Provider, MD  lubiprostone (AMITIZA) 8 MCG capsule Take 1 capsule (8 mcg total) by mouth 2 (two) times daily with a meal. 12/07/12  Yes Vernie Shanks, MD  MAGNESIUM PO Take 1 tablet by mouth daily.    Yes Historical Provider, MD  metroNIDAZOLE (FLAGYL) 500 MG tablet Take 1 tablet (500 mg total) by mouth 2 (two) times daily. 06/21/13  Yes Mary-Margaret Hassell Done, FNP  montelukast (SINGULAIR) 10 MG tablet Take 1 tablet (10 mg total) by mouth at bedtime. 11/07/12  Yes Mae Loree Fee, FNP  ondansetron (ZOFRAN) 4 MG tablet Take 1 tablet (4 mg total) by mouth every 8 (eight) hours as needed for nausea or vomiting. 06/21/13  Yes Mary-Margaret Hassell Done, FNP  pantoprazole (PROTONIX) 40 MG tablet Take 1 tablet (40 mg total) by mouth daily. 03/27/13  Yes Vernie Shanks, MD  ranitidine (ZANTAC) 75  MG tablet Take 150 mg by mouth daily.   Yes Historical Provider, MD   Triage Vitals: BP 150/90  Pulse 132  Temp(Src) 98.4 F (36.9 C) (Oral)  Resp 14  Ht 5\' 2"  (1.575 m)  Wt 164 lb (74.39 kg)  BMI 29.99 kg/m2  SpO2 98%  Physical Exam  Constitutional: She is oriented to person, place, and time. She appears well-developed.  HENT:  Head: Normocephalic.  Eyes: Conjunctivae and EOM are normal. No scleral icterus.  Neck: Neck supple. No thyromegaly present.  Cardiovascular: Regular rhythm.  Exam reveals no gallop and no friction rub.   No murmur heard. Slightly tachycardic.   Pulmonary/Chest: No stridor. She has no wheezes. She has no rales. She exhibits no tenderness.  Abdominal: She exhibits no distension. There is tenderness. There is no rebound.  Moderate LLQ tenderness.   Musculoskeletal: Normal range of motion. She exhibits no edema.  Lymphadenopathy:    She has no cervical adenopathy.  Neurological: She is oriented to person, place, and time. She exhibits normal muscle tone. Coordination normal.  Skin: No rash noted. No erythema.  Psychiatric: She has a normal  mood and affect. Her behavior is normal.    ED Course  Procedures (including critical care time)  DIAGNOSTIC STUDIES: Oxygen Saturation is 98% on room air, normal by my interpretation.    COORDINATION OF CARE: 4:48 PM- Ordered a CT of the abdomen, CBC, and BMP. Ordered Dilaudid and Zofran to manage symptoms. Discussed treatment plan with patient at bedside and patient verbalized agreement.     Labs Review Labs Reviewed  BASIC METABOLIC PANEL - Abnormal; Notable for the following:    Glucose, Bld 128 (*)    All other components within normal limits  CBC WITH DIFFERENTIAL    Imaging Review Dg Abd 1 View  06/21/2013   CLINICAL DATA:  Lower abdominal pain, diverticulitis  EXAM: ABDOMEN - 1 VIEW  COMPARISON:  None dominant radiograph 11/18/2012; CT abdomen and pelvis 05/12/2013  FINDINGS: Normal bowel gas pattern.  No bowel dilatation or bowel wall thickening.  Lung bases clear.  Bones demineralized.  Surgical clips in right mid abdomen question cholecystectomy.  No urinary tract calcification.  IMPRESSION: No acute abnormalities.   Electronically Signed   By: Lavonia Dana M.D.   On: 06/21/2013 13:37   Ct Abdomen Pelvis W Contrast  06/21/2013   CLINICAL DATA:  Low abdominal pain with nausea. Urinary tract infection.  EXAM: CT ABDOMEN AND PELVIS WITH CONTRAST  TECHNIQUE: Multidetector CT imaging of the abdomen and pelvis was performed using the standard protocol following bolus administration of intravenous contrast.  CONTRAST:  176mL OMNIPAQUE IOHEXOL 300 MG/ML  SOLN  COMPARISON:  Abdominal pelvic CT 05/12/2013.  FINDINGS: The visualized lung bases are clear. There is no significant pleural or pericardial effusion.  The liver demonstrates stable enlargement and low density consistent with steatosis. The gallbladder is surgically absent. There is no biliary dilatation. The spleen, pancreas and adrenal glands appear normal. The kidneys appear stable. 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  The  stomach, small bowel and appendix appear normal. There is prominent stool in the proximal colon. There are increased inflammatory changes surrounding the sigmoid colon. There is an inflamed diverticulum projecting posteriorly with adjacent sigmoid colon wall thickening. No extraluminal fluid collection or bowel obstruction identified. Mild aortoiliac atherosclerosis appears stable. Small lymph nodes within the porta hepatis are stable.  The bladder, ovaries and cervix appear unremarkable status post partial hysterectomy. Small umbilical hernia containing only fat  is unchanged.  There are no worrisome osseous findings.  IMPRESSION: 1. Sigmoid diverticulitis, worsened compared with prior study of for approximately 5 weeks ago. No evidence of perforation, abscess or obstruction. 2. No other acute findings. 3. Hepatic steatosis.   Electronically Signed   By: Camie Patience M.D.   On: 06/21/2013 17:53     EKG Interpretation None      MDM   Final diagnoses:  None    The chart was scribed for me under my direct supervision.  I personally performed the history, physical, and medical decision making and all procedures in the evaluation of this patient.Teresa Diego, MD 06/21/13 [id number]

## 2013-06-21 NOTE — Progress Notes (Signed)
Subjective:    Patient ID: Teresa [name], female    DOB: 12/31/1955, 58 y.o.   MRN: [id number]  HPI Patient in today c/o abdominal pain that started 1 month ago- Went to ER 4 weeks ago and they put her on antibiotics for diverticulitis and started feeling some better. SInce stopped antibbiotics 1 1/2 weeks ago pain is coming back.     Review of Systems  Constitutional: Negative for fever, chills and appetite change.  Respiratory: Negative.   Cardiovascular: Negative.   Gastrointestinal: Positive for abdominal pain. Negative for diarrhea and constipation.  Genitourinary: Negative.   Musculoskeletal: Negative.   Psychiatric/Behavioral: Negative.   All other systems reviewed and are negative.      Objective:   Physical Exam  Constitutional: She is oriented to person, place, and time. She appears well-developed and well-nourished. No distress.  Cardiovascular: Normal rate, regular rhythm and normal heart sounds.   Pulmonary/Chest: Effort normal and breath sounds normal.  Abdominal: Soft. Bowel sounds are normal. There is tenderness (bil lower quadrants).  Neurological: She is alert and oriented to person, place, and time.  Skin: Skin is warm and dry.  Psychiatric: She has a normal mood and affect. Her behavior is normal. Judgment and thought content normal.   BP 145/92  Pulse 110  Temp(Src) 98.1 F (36.7 C) (Oral)  Ht 5' 2.4" (1.585 m)  Wt 164 lb 12.8 oz (74.753 kg)  BMI 29.76 kg/m2  KUB- normal-Preliminary reading by Ronnald Collum, FNP  [facility]  Results for orders placed in visit on 06/21/13  POCT CBC      Result Value Ref Range   WBC 10.3 (*) 4.6 - 10.2 K/uL   Lymph, poc 2.0  0.6 - 3.4   POC LYMPH PERCENT 19.8  10 - 50 %L   MID (cbc)    0 - 0.9   POC MID %    0 - 12 %M   POC Granulocyte 7.9 (*) 2 - 6.9   Granulocyte percent 76.6  37 - 80 %G   RBC 5.2  4.04 - 5.48 M/uL   Hemoglobin 13.4  12.2 - 16.2 g/dL   HCT, POC 41.7  37.7 - 47.9 %   MCV 80.0  80 - 97 fL   MCH,  POC 25.6 (*) 27 - 31.2 pg   MCHC 32.1  31.8 - 35.4 g/dL   RDW, POC 14.8     Platelet Count, POC 210.0  142 - 424 K/uL   MPV 7.3  0 - 99.8 fL  POCT UA - MICROSCOPIC ONLY      Result Value Ref Range   WBC, Ur, HPF, POC 10-15     RBC, urine, microscopic neg     Bacteria, U Microscopic neg     Mucus, UA neg     Epithelial cells, urine per micros occ     Crystals, Ur, HPF, POC neg     Casts, Ur, LPF, POC neg     Yeast, UA neg    POCT URINALYSIS DIPSTICK      Result Value Ref Range   Color, UA yellow     Clarity, UA clear     Glucose, UA neg     Bilirubin, UA neg     Ketones, UA neg     Spec Grav, UA <=1.005     Blood, UA neg     pH, UA 6.5     Protein, UA neg     Urobilinogen, UA negative  Nitrite, UA neg     Leukocytes, UA Trace          Assessment & Plan:   1. Abdominal pain, acute, generalized   2. Diverticulitis large intestine   3. UTI (urinary tract infection)    Meds ordered this encounter  Medications  . metroNIDAZOLE (FLAGYL) 500 MG tablet    Sig: Take 1 tablet (500 mg total) by mouth 2 (two) times daily.    Dispense:  20 tablet    Refill:  0    Order Specific Question:  Supervising Provider    Answer:  Chipper Herb [[id number]]  . ciprofloxacin (CIPRO) 500 MG tablet    Sig: Take 1 tablet (500 mg total) by mouth 2 (two) times daily.    Dispense:  20 tablet    Refill:  0    Order Specific Question:  Supervising Provider    Answer:  Joycelyn Man   Force fluids First 24 Hours-Clear liquids  popsicles  Jello  gatorade  Sprite Second 24 hours-Add Full liquids ( Liquids you cant see through) Third 24 hours- Bland diet ( foods that are baked or broiled)  *avoiding fried foods and highly spiced foods* During these 3 days  Avoid milk, cheese, ice cream or any other dairy products  Avoid caffeine- REMEMBER Mt. Dew and Mello Yellow contain lots of caffeine You should eat and drink in  Frequent small volumes If no improvement in symptoms or worsen  in 2-3 days should RETRUN TO OFFICE or go to ER!    Mary-Margaret Hassell Done, FNP

## 2013-06-21 NOTE — Discharge Instructions (Signed)
Take your antibiotics and follow up with your md next week.

## 2013-06-21 NOTE — Addendum Note (Signed)
Addended by: Chevis Pretty on: 06/21/2013 10:41 AM   Modules accepted: Orders

## 2013-06-21 NOTE — ED Notes (Addendum)
C/o lower abdominal pain,  Nausea.  Went to pcp today and had a UA checked and was negative for UTI. Also had lab work at [facility].

## 2013-06-21 NOTE — Patient Instructions (Signed)

## 2013-07-02 ENCOUNTER — Other Ambulatory Visit: Payer: Self-pay | Admitting: General Practice

## 2013-07-02 ENCOUNTER — Other Ambulatory Visit: Payer: Self-pay | Admitting: Nurse Practitioner

## 2013-07-02 ENCOUNTER — Telehealth: Payer: Self-pay | Admitting: Nurse Practitioner

## 2013-07-02 ENCOUNTER — Ambulatory Visit (INDEPENDENT_AMBULATORY_CARE_PROVIDER_SITE_OTHER): Payer: Medicare Other | Admitting: Family Medicine

## 2013-07-02 ENCOUNTER — Encounter: Payer: Self-pay | Admitting: Family Medicine

## 2013-07-02 ENCOUNTER — Other Ambulatory Visit: Payer: Self-pay | Admitting: Family Medicine

## 2013-07-02 VITALS — BP 125/81 | HR 104 | Temp 97.6°F | Ht 62.0 in | Wt 160.2 lb

## 2013-07-02 DIAGNOSIS — K219 Gastro-esophageal reflux disease without esophagitis: Secondary | ICD-10-CM

## 2013-07-02 DIAGNOSIS — K5792 Diverticulitis of intestine, part unspecified, without perforation or abscess without bleeding: Secondary | ICD-10-CM

## 2013-07-02 DIAGNOSIS — K5900 Constipation, unspecified: Secondary | ICD-10-CM | POA: Diagnosis not present

## 2013-07-02 DIAGNOSIS — B370 Candidal stomatitis: Secondary | ICD-10-CM | POA: Diagnosis not present

## 2013-07-02 DIAGNOSIS — K5732 Diverticulitis of large intestine without perforation or abscess without bleeding: Secondary | ICD-10-CM

## 2013-07-02 LAB — POCT CBC
Granulocyte percent: 61.1 %G (ref 37–80)
HCT, POC: 43.3 % (ref 37.7–47.9)
Hemoglobin: 13.6 g/dL (ref 12.2–16.2)
Lymph, poc: 2.9 (ref 0.6–3.4)
MCH, POC: 25 pg — AB (ref 27–31.2)
MCHC: 31.3 g/dL — AB (ref 31.8–35.4)
MCV: 79.7 fL — AB (ref 80–97)
MPV: 7.4 fL (ref 0–99.8)
POC Granulocyte: 5.4 (ref 2–6.9)
POC LYMPH PERCENT: 33.2 %L (ref 10–50)
Platelet Count, POC: 233 10*3/uL (ref 142–424)
RBC: 5.4 M/uL (ref 4.04–5.48)
RDW, POC: 15.1 %
WBC: 8.8 10*3/uL (ref 4.6–10.2)

## 2013-07-02 MED ORDER — NYSTATIN 100000 UNIT/ML MT SUSP: 5.0000 mL | Freq: Four times a day (QID) | OROMUCOSAL | Status: DC

## 2013-07-02 MED ORDER — DEXLANSOPRAZOLE 60 MG PO CPDR: 60.0000 mg | DELAYED_RELEASE_CAPSULE | Freq: Every day | ORAL | Status: DC

## 2013-07-02 MED ORDER — LUBIPROSTONE 24 MCG PO CAPS: 24.0000 ug | ORAL_CAPSULE | Freq: Two times a day (BID) | ORAL | Status: DC

## 2013-07-02 NOTE — Telephone Encounter (Signed)
appt given for 3:30 with Dietrich Pates

## 2013-07-02 NOTE — Progress Notes (Signed)
   Subjective:    Patient ID: Teresa [name], female    DOB: Oct 02, 1955, 58 y.o.   MRN: [id number]  HPI This 58 y.o. female presents for evaluation of weakness and tremors and goes away after she eats. She has recently seen in the ED for abdominal pain and was dx'd with diverticulitis.  She was tx'd with cipro and flagyl and she is still hurting some but feels a lot better.  She has been having some low blood sugar sx's,  She has been having some thrush..   Review of Systems C/o being shaky and having low blood sugar No chest pain, SOB, HA, dizziness, vision change, N/V, diarrhea, constipation, dysuria, urinary urgency or frequency, myalgias, arthralgias or rash.     Objective:   Physical Exam  Vital signs noted  Well developed well nourished female.  HEENT - Head atraumatic Normocephalic                Eyes - PERRLA, Conjuctiva - clear Sclera- Clear EOMI                Ears - EAC's Wnl TM's Wnl Gross Hearing WNL                Nose - Nares patent                 Throat - oropharanx wnl Respiratory - Lungs CTA bilateral Cardiac - RRR S1 and S2 without murmur GI - Abdomen soft Nontender and bowel sounds active x 4 Extremities - No edema. Neuro - Grossly intact.      Assessment & Plan:  GERD (gastroesophageal reflux disease) - Plan: dexlansoprazole (DEXILANT) 60 MG capsule  Thrush - Plan: nystatin (MYCOSTATIN) 100000 UNIT/ML suspension  Hypoglycemia - Get bmp, discussed eating low carb and frequent meals  Unspecified constipation - Plan: lubiprostone (AMITIZA) 24 MCG capsule  Diverticulitis - Plan: POCT CBC, BMP8+EGFR  Lysbeth Penner FNP

## 2013-07-03 ENCOUNTER — Telehealth: Payer: Self-pay | Admitting: Family Medicine

## 2013-07-03 LAB — BMP8+EGFR
BUN/Creatinine Ratio: 6 — ABNORMAL LOW (ref 9–23)
BUN: 5 mg/dL — ABNORMAL LOW (ref 6–24)
CO2: 21 mmol/L (ref 18–29)
Calcium: 9.5 mg/dL (ref 8.7–10.2)
Chloride: 103 mmol/L (ref 97–108)
Creatinine, Ser: 0.79 mg/dL (ref 0.57–1.00)
GFR calc Af Amer: 97 mL/min/{1.73_m2} (ref >59)
GFR calc non Af Amer: 85 mL/min/{1.73_m2} (ref >59)
Glucose: 87 mg/dL (ref 65–99)
Potassium: 3.8 mmol/L (ref 3.5–5.2)
Sodium: 143 mmol/L (ref 134–144)

## 2013-07-05 ENCOUNTER — Encounter: Payer: Self-pay | Admitting: Family Medicine

## 2013-07-05 ENCOUNTER — Ambulatory Visit (INDEPENDENT_AMBULATORY_CARE_PROVIDER_SITE_OTHER): Payer: Medicare Other | Admitting: Family Medicine

## 2013-07-05 VITALS — BP 133/85 | HR 114 | Temp 97.0°F | Ht 62.0 in | Wt 161.0 lb

## 2013-07-05 DIAGNOSIS — R4702 Dysphasia: Secondary | ICD-10-CM

## 2013-07-05 DIAGNOSIS — R4789 Other speech disturbances: Secondary | ICD-10-CM | POA: Diagnosis not present

## 2013-07-05 DIAGNOSIS — K5900 Constipation, unspecified: Secondary | ICD-10-CM | POA: Diagnosis not present

## 2013-07-05 MED ORDER — LUBIPROSTONE 8 MCG PO CAPS: ORAL_CAPSULE | ORAL | Status: DC

## 2013-07-05 NOTE — Progress Notes (Signed)
   Subjective:    Patient ID: Teresa [name], female    DOB: 10-Nov-1955, 58 y.o.   MRN: [id number]  HPI This 58 y.o. female presents for evaluation of abdominal pain.  She has hx of diverticulitis and she was seen over a week ago for hospital follow up and was still having left lower quadrant abdominal pain.  She was tx'd at the ED for abdominal pain due to diverticulitis the week prior.  She was tx'd with additional round of flagyl and cipro and the pain persists.  She is c/o diarrhea because the amitiza was too strong.  She was on the amitiza 8mg  and this was not working.  She has been having some  Problems with swallowing and states it is time to have her esophagus stretched again.   Review of Systems C/o dysphasia, abdominal pain, and constipation. No chest pain, SOB, HA, dizziness, vision change, N/V, diarrhea, constipation, dysuria, urinary urgency or frequency, myalgias, arthralgias or rash.     Objective:   Physical Exam Vital signs noted  Well developed well nourished female.  HEENT - Head atraumatic Normocephalic                Eyes - PERRLA, Conjuctiva - clear Sclera- Clear EOMI                Ears - EAC's Wnl TM's Wnl Gross Hearing WNL                Nose - Nares patent                 Throat - oropharanx wnl Respiratory - Lungs CTA bilateral Cardiac - RRR S1 and S2 without murmur GI - Abdomen soft Nontender and bowel sounds active x 4 Extremities - No edema. Neuro - Grossly intact.       Assessment & Plan:  Dysphasia - Plan: Ambulatory referral to Gastroenterology  Unspecified constipation - Plan: lubiprostone (AMITIZA) 8 MCG capsule  Diverticulitis - Will refer to [place] FNP

## 2013-07-06 DIAGNOSIS — G44209 Tension-type headache, unspecified, not intractable: Secondary | ICD-10-CM | POA: Diagnosis not present

## 2013-07-09 ENCOUNTER — Telehealth: Payer: Self-pay | Admitting: Internal Medicine

## 2013-07-09 NOTE — Telephone Encounter (Signed)
Pt states she has been having problems with dysphagia and has been treated for diverticulitis. Requests to be seen. Pt scheduled to see Alonza Bogus PA 07/12/13@9am . Pt aware of appt.

## 2013-07-09 NOTE — Telephone Encounter (Signed)
Left message for pt to call back  °

## 2013-07-12 ENCOUNTER — Encounter: Payer: Self-pay | Admitting: *Deleted

## 2013-07-12 ENCOUNTER — Ambulatory Visit: Payer: Medicare Other | Admitting: Gastroenterology

## 2013-07-16 ENCOUNTER — Ambulatory Visit (INDEPENDENT_AMBULATORY_CARE_PROVIDER_SITE_OTHER): Payer: Medicare Other | Admitting: Gastroenterology

## 2013-07-16 ENCOUNTER — Encounter: Payer: Self-pay | Admitting: Gastroenterology

## 2013-07-16 VITALS — BP 120/80 | HR 96 | Ht 62.0 in | Wt 162.0 lb

## 2013-07-16 DIAGNOSIS — K5900 Constipation, unspecified: Secondary | ICD-10-CM | POA: Diagnosis not present

## 2013-07-16 DIAGNOSIS — R1319 Other dysphagia: Secondary | ICD-10-CM | POA: Diagnosis not present

## 2013-07-16 DIAGNOSIS — K5732 Diverticulitis of large intestine without perforation or abscess without bleeding: Secondary | ICD-10-CM

## 2013-07-16 MED ORDER — LINACLOTIDE 145 MCG PO CAPS: 145.0000 ug | ORAL_CAPSULE | Freq: Every day | ORAL | Status: DC

## 2013-07-16 MED ORDER — AMOXICILLIN-POT CLAVULANATE 875-125 MG PO TABS: 1.0000 | ORAL_TABLET | Freq: Two times a day (BID) | ORAL | Status: DC

## 2013-07-16 NOTE — Patient Instructions (Signed)
You have been scheduled for an endoscopy with propofol. Please follow written instructions given to you at your visit today. If you use inhalers (even only as needed), please bring them with you on the day of your procedure. Your physician has requested that you go to www.startemmi.com and enter the access code given to you at your visit today. This web site gives a general overview about your procedure. However, you should still follow specific instructions given to you by our office regarding your preparation for the procedure.  Please follow Low Fiber Diet below  We have sent the following medications to your pharmacy for you to pick up at your convenience: Augmentin 875 mg, take one capsule by mouth twice daily for fourteen days  We have given you samples of the following medication to take: Linzess 145 mcg, please take one capsule by mouth once daily on an empty stomach   Please call back Jul 24, 2013 to schedule a follow up appointment with Alonza Bogus for the week of July 30, 2013  _______________________________________________________________________________________________________________________________________________________________________________________________  Low-Fiber Diet Fiber is found in fruits, vegetables, and grains. A low-fiber diet restricts fibrous foods that are not digested in the small intestine. A diet containing about 10 grams of fiber is considered low fiber.  PURPOSE  To prevent blockage of a partially obstructed or narrowed gastrointestinal tract.  To reduce fecal weight and volume.  To slow the movement of feces. WHEN IS THIS DIET USED?  It may be used during the acute phase of Crohn disease, ulcerative colitis, regional enteritis, or diverticulitis.  It may be used if your intestinal or esophageal tubes are narrowing (stenosis).  It may be used as a transitional diet following surgery, injury (trauma), or illness. CHOOSING FOODS Check labels,  especially on foods from the starch list. Often times, dietary fiber content is listed on the nutrition facts panel. Please ask your Registered Dietitian if you have questions about specific foods that are related to your condition, especially if the food is not listed on this handout. Breads and Starches  Allowed: White, [place], and pita breads, plain rolls, buns, or sweet rolls, doughnuts, waffles, pancakes, bagels. Plain muffins, biscuits, matzoth. Soda, saltine, graham crackers. Pretzels, rusks, melba toast, zwieback. Cooked cereals: cornmeal, farina, or cream cereals. Dry cereals: refined corn, wheat, rice, and oat cereals (check label). Potatoes prepared any way without skins, refined macaroni, spaghetti, noodles, refined rice.  Avoid: Whole-wheat bread, rolls, and crackers. Multigrains, rye, bran seeds, nuts, or coconut. Cereals containing whole grains, multigrains, bran, coconut, nuts, raisins. Cooked or dry oatmeal. Coarse wheat cereals, granola. Cereals advertised as "high fiber." Potato skins. Whole-grain pasta, wild or brown rice. Popcorn. Vegetables  Allowed: Strained tomato and vegetable juices. Fresh lettuce, cucumber, spinach. Well-cooked or canned: asparagus, bean sprouts, broccoli, cut green beans, cauliflower, pumpkin, beets, mushrooms, yellow squash, tomato, tomato sauce, zucchini, turnips.Keep servings limited to  cup.  Avoid: Fresh, cooked, or canned: artichokes, baked beans, beet greens, Brussels sprouts, corn, kale, legumes, peas, sweet potatoes. Avoid large servings of any vegetables. Fruit  Allowed: All fruit juices except prune juice. Cooked or canned fruits without skin and seeds: apricots, applesauce, cantaloupe, cherries, grapefruit, grapes, kiwi, mandarin oranges, peaches, pears, fruit cocktail, pineapple, plums, watermelon. Fresh without skin: banana, grapes, cantaloupe, avocado, cherries, pineapple, kiwi, nectarines, peaches, blueberries. Keep servings limited to  cup  or 1 piece.  Avoid: Fresh: apples with or without skin, apricots, mangoes, pears, raspberries, strawberries. Prune juice and juices with pulp, stewed or dried prunes. Dried  fruits, raisins, dates. Avoid large servings of all fresh fruits. Meat and Protein Substitutes  Allowed: Ground or well-cooked tender beef, ham, veal, lamb, pork, poultry. Eggs, plain cheese. Fish, oysters, shrimp, lobster, other seafood. Liver, organ meats. Smooth nut butters.  Avoid: Tough, fibrous meats with gristle. Chunky nut butter.Cheese with seeds, nuts, or other foods not allowed. Nuts, seeds, legumes, dried peas, beans, lentils. Dairy  Allowed: All milk products except those not allowed.  Avoid: Yogurt or cheese that contains nuts, seeds, or added fruit. Soups and Combination Foods  Allowed: Bouillon, broth, or cream soups made from allowed foods. Any strained soup. Casseroles or mixed dishes made with allowed foods.  Avoid: Soups made from vegetables that are not allowed or that contain other foods not allowed. Desserts and Sweets  Allowed:Plain cakes and cookies, pie made with allowed fruit, pudding, custard, cream pie. Gelatin, fruit, ice, sherbet, frozen ice pops. Ice cream, ice milk without nuts. Plain hard candy, honey, jelly, molasses, syrup, sugar, chocolate syrup, gumdrops, marshmallows.  Avoid: Desserts, cookies, or candies that contain nuts, peanut butter, dried fruits. Jams, preserves with seeds, marmalade. Fats and Oils  Allowed:Margarine, butter, cream, mayonnaise, salad oils, plain salad dressings made from allowed foods.  Avoid: Seeds, nuts, olives. Beverages  Allowed: All, except those listed to avoid.  Avoid: Fruit juices with high pulp, prune juice. Condiments  Allowed:Ketchup, mustard, horseradish, vinegar, cream sauce, cheese sauce, cocoa powder. Spices in moderation: allspice, basil, bay leaves, celery powder or leaves, cinnamon, cumin powder, curry powder, ginger, mace,  marjoram, onion or garlic powder, oregano, paprika, parsley flakes, ground pepper, rosemary, sage, savory, tarragon, thyme, turmeric.  Avoid: Coconut, pickles. SAMPLE MENU Breakfast   cup orange juice.  1 boiled egg.  1 slice white toast.  Margarine.   cup cornflakes.  1 cup milk.  Beverage. Lunch   cup chicken noodle soup.  2 to 3 oz sliced roast beef.  2 slices white bread.  Mayonnaise.   cup tomato juice.  1 small banana.  Beverage. Dinner  3 oz baked chicken.   cup scalloped potatoes.   cup cooked beets.  White dinner roll.  Margarine.   cup canned peaches.  Beverage. Document Released: 08/07/2001 Document Revised: 10/18/2012 Document Reviewed: 03/04/2011 [facility] Patient Information 2014 [place].

## 2013-07-16 NOTE — Progress Notes (Signed)
07/16/2013 Teresa [name] [id number] 10/11/1955   History of Present Illness:  This is a 58 year old female who is known to Dr. Henrene Pastor for previous complaints of dysphagia. She underwent an EGD in August 2014 at which time she was found to have a stricture in her esophagus that was dilated with 88 [place]. She also has long-standing issues with irritable bowel syndrome/constipation.  She presents to the office today with a few different issues. First, she complains of recurrent dysphagia to both solid food and liquids at times as well. She states that the symptoms are the same as they were previously before undergoing her EGD in August 2014. She states after being dilated her symptoms resolved/improved significantly until approximately 2 months ago.  She is on daily PPI therapy.  She has also been suffering from ongoing diverticulitis. She underwent a CT scan of the abdomen and pelvis with contrast on 05/12/2013 at which time she had sigmoid diverticulosis and minimal proximal sigmoid diverticulitis without abscess. She was treated with 14 days of Cipro and Flagyl at that time. She was off antibiotics for a while, but continued to have complaints of LLQ abdominal pain. She underwent repeat CT scan of the abdomen and pelvis with contrast on 06/21/2013 in the emergency department at which time she was found to have sigmoid diverticulitis worsened compared to the study that was performed 5 weeks prior. There was no sign of perforation, abscess, or obstruction. She was then treated with another 10 days of Cipro and Flagyl and completed that on approximately May 3. She still continues to complain of pain in the same spot in her left lower quadrant. She says that it has improved but it is definitely still painful and overall she does not feel well.  Her last colonoscopy was in May 2007 at which time she was only found have diverticulosis. She admits that she does eat a lot of popcorn, but has not been eating any  during the course of this diverticulitis. She denies any fever. She does have some nausea but no vomiting.  In regards to her chronic constipation, she had been taking amitiza 8 mcg twice daily for quite some time. This seemed to not be working as well for her recently and she was given a 24 mcg dosing by her PCP. She states this seemed to be too strong and was causing her to have very watery, uncontrollable stools.   Current Medications, Allergies, Past Medical History, Past Surgical History, Family History and Social History were reviewed in [employer] record.   Physical Exam: BP 120/80  Pulse 96  Ht 5\' 2"  (1.575 m)  Wt 162 lb (73.483 kg)  BMI 29.62 kg/m2 General: Well developed white female in no acute distress; non-toxic appearing. Head: Normocephalic and atraumatic Eyes:  Sclerae anicteric, conjunctiva pink  Ears: Normal auditory acuity Lungs: Clear throughout to auscultation Heart: Regular rate and rhythm Abdomen: Soft, non-distended. Normal bowel sounds.  LLQ TTP, but abdomen is benign. Musculoskeletal: Symmetrical with no gross deformities  Extremities: No edema  Neurological: Alert oriented x 4, grossly non-focal Psychological:  Alert and cooperative. Normal mood and affect  Assessment and Recommendations: -Dysphagia:  To both solid foods and liquids.  Similar to previous complaints of dysphagia with resolution/improvement in symptoms after dilation.  She is on PPI therapy and will continue that. -History of esophageal stricture s/p dilation in 09/2012 -Ongoing diverticulitis with LLQ abdominal pain:  Still has pain after two courses of cipro and flagyl.  She has  already undergone two CT scans.  Will treat her again with Augmentin 875 mg BID for 14 days.  She will follow-up here in two weeks.  If no improvement after that the antibiotics then would consider repeating CT scan again.  If improved then would consider scheduling colonoscopy 6-8 weeks out.  She  has allergy to Cephalexin, but says that she has taken Augmentin in the past.  Will take liquids only for the next couple of days then will eat low fiber diet acutely until issue is resolved; dietary instructions given. -Chronic constipation/IBS:  Will discontinue amitiza and try linzess 145 mcg daily (samples only given for now).   *Will schedule EGD with Dr. Henrene Pastor.  The risks, benefits, and alternatives were discussed with the patient and she consents to proceed.

## 2013-07-16 NOTE — Progress Notes (Signed)
Agree with initial assessment and plans 

## 2013-07-17 ENCOUNTER — Telehealth: Payer: Self-pay | Admitting: *Deleted

## 2013-07-17 NOTE — Telephone Encounter (Signed)
Teresa [name] called back. Advised patient to remind nurse before she leaves tomorrow to get samples of Linzess that she forgot to take. Patient verbalized understanding.

## 2013-07-18 ENCOUNTER — Encounter: Payer: Self-pay | Admitting: Internal Medicine

## 2013-07-18 ENCOUNTER — Ambulatory Visit (AMBULATORY_SURGERY_CENTER): Payer: Medicare Other | Admitting: Internal Medicine

## 2013-07-18 VITALS — BP 127/79 | HR 86 | Temp 96.3°F | Resp 22 | Ht 62.0 in | Wt 162.0 lb

## 2013-07-18 DIAGNOSIS — K222 Esophageal obstruction: Secondary | ICD-10-CM

## 2013-07-18 DIAGNOSIS — K219 Gastro-esophageal reflux disease without esophagitis: Secondary | ICD-10-CM | POA: Diagnosis not present

## 2013-07-18 DIAGNOSIS — R1310 Dysphagia, unspecified: Secondary | ICD-10-CM | POA: Diagnosis not present

## 2013-07-18 DIAGNOSIS — J45909 Unspecified asthma, uncomplicated: Secondary | ICD-10-CM | POA: Diagnosis not present

## 2013-07-18 DIAGNOSIS — M1990 Unspecified osteoarthritis, unspecified site: Secondary | ICD-10-CM | POA: Diagnosis not present

## 2013-07-18 DIAGNOSIS — R1319 Other dysphagia: Secondary | ICD-10-CM | POA: Diagnosis not present

## 2013-07-18 MED ORDER — SODIUM CHLORIDE 0.9 % IV SOLN: 500.0000 mL | INTRAVENOUS | Status: DC

## 2013-07-18 NOTE — Progress Notes (Signed)
Called to room to assist during endoscopic procedure.  Patient ID and intended procedure confirmed with present staff. Received instructions for my participation in the procedure from the performing physician.  

## 2013-07-18 NOTE — Progress Notes (Signed)
Procedure ends, to recovery, report given and VSS. 

## 2013-07-18 NOTE — Op Note (Signed)
[facility]  [address] [place], [address]   ENDOSCOPY PROCEDURE REPORT  PATIENT: Teresa [name], Teresa [name]  MR#: [id number] BIRTHDATE: 1955-05-10 , 57  yrs. old GENDER: Female ENDOSCOPIST: Eustace Quail, MD REFERRED BY:  .  Self / Office PROCEDURE DATE:  07/18/2013 PROCEDURE:  EGD, diagnostic and Maloney dilation of esophagus  -54 ASA CLASS:     Class II INDICATIONS:  Dysphagia. MEDICATIONS: MAC sedation, administered by CRNA and propofol (Diprivan) 130mg  IV TOPICAL ANESTHETIC: none  DESCRIPTION OF PROCEDURE: After the risks benefits and alternatives of the procedure were thoroughly explained, informed consent was obtained.  The LB [id number] endoscope was introduced through the mouth and advanced to the second portion of the duodenum. Without limitations.  The instrument was slowly withdrawn as the mucosa was fully examined.      The upper, middle and distal third of the esophagus were carefully inspected and no abnormalities were noted.  The z-line was well seen at the GEJ.  The endoscope was pushed into the fundus which was normal including a retroflexed view.  The antrum, gastric body, first and second part of the duodenum were unremarkable. Retroflexed views revealed no abnormalities.     The scope was then withdrawn from the patient and the procedure completed. THERAPY: 45 French Maloney dilator passed into the esophagus without resistance or heme. Tolerated well  COMPLICATIONS: There were no complications. ENDOSCOPIC IMPRESSION: 1. Normal EGD 2. Status post Maloney dilation for dysphagia  RECOMMENDATIONS: 1. Continue current meds 2. Followup in the office in one month regarding left lower quadrant pain/diverticulitis.  REPEAT EXAM:  eSigned:  Eustace Quail, MD 07/18/2013 8:48 AM   KZ:SWFU, Joaquim Lai MD and The Patient

## 2013-07-18 NOTE — Patient Instructions (Signed)
YOU HAD AN ENDOSCOPIC PROCEDURE TODAY AT [facility]: Refer to the procedure report that was given to you for any specific questions about what was found during the examination.  If the procedure report does not answer your questions, please call your gastroenterologist to clarify.  If you requested that your care partner not be given the details of your procedure findings, then the procedure report has been included in a sealed envelope for you to review at your convenience later.  YOU SHOULD EXPECT: Some feelings of bloating in the abdomen. Passage of more gas than usual.  Walking can help get rid of the air that was put into your GI tract during the procedure and reduce the bloating.  DIET:  Follow dilation diet- see handout  Drink plenty of fluids but you should avoid alcoholic beverages for 24 hours.  ACTIVITY: Your care partner should take you home directly after the procedure.  You should plan to take it easy, moving slowly for the rest of the day.  You can resume normal activity the day after the procedure however you should NOT DRIVE or use heavy machinery for 24 hours (because of the sedation medicines used during the test).    SYMPTOMS TO REPORT IMMEDIATELY: A gastroenterologist can be reached at any hour.  During normal business hours, 8:30 AM to 5:00 PM Monday through Friday, call [phone].  After hours and on weekends, please call the GI answering service at [phone] who will take a message and have the physician on call contact you.   Following upper endoscopy (EGD)  Vomiting of blood or coffee ground material  New chest pain or pain under the shoulder blades  Painful or persistently difficult swallowing  New shortness of breath  Fever of 100F or higher  Black, tarry-looking stools  FOLLOW UP: Our staff will call the home number listed on your records the next business day following your procedure to check on you and address any questions or concerns  that you may have at that time regarding the information given to you following your procedure. This is a courtesy call and so if there is no answer at the home number and we have not heard from you through the emergency physician on call, we will assume that you have returned to your regular daily activities without incident.  SIGNATURES/CONFIDENTIALITY: You and/or your care partner have signed paperwork which will be entered into your electronic medical record.  These signatures attest to the fact that that the information above on your After Visit Summary has been reviewed and is understood.  Full responsibility of the confidentiality of this discharge information lies with you and/or your care-partner.  Follow dilation diet today  Continue your normal medications- very important to finish your antibiotics  Please call tomorrow to set up a follow up appointment for 2-4 weeks

## 2013-07-19 ENCOUNTER — Telehealth: Payer: Self-pay | Admitting: *Deleted

## 2013-07-19 NOTE — Telephone Encounter (Signed)
  Follow up Call-  Call back number 07/18/2013 10/03/2012  Post procedure Call Back phone  # [phone]  Permission to leave phone message Yes Yes     Patient questions:  Message left to call us if necessary.

## 2013-07-30 ENCOUNTER — Ambulatory Visit (INDEPENDENT_AMBULATORY_CARE_PROVIDER_SITE_OTHER): Payer: Medicare Other | Admitting: Family Medicine

## 2013-07-30 ENCOUNTER — Encounter: Payer: Self-pay | Admitting: Family Medicine

## 2013-07-30 VITALS — BP 128/85 | HR 105 | Temp 98.2°F | Ht 62.0 in | Wt 159.0 lb

## 2013-07-30 DIAGNOSIS — J209 Acute bronchitis, unspecified: Secondary | ICD-10-CM

## 2013-07-30 DIAGNOSIS — M549 Dorsalgia, unspecified: Secondary | ICD-10-CM | POA: Diagnosis not present

## 2013-07-30 MED ORDER — METHYLPREDNISOLONE ACETATE 80 MG/ML IJ SUSP
80.0000 mg | Freq: Once | INTRAMUSCULAR | Status: AC
  Administered 2013-07-30: 80 mg via INTRAMUSCULAR

## 2013-07-30 MED ORDER — TRAMADOL HCL 50 MG PO TABS: 50.0000 mg | ORAL_TABLET | Freq: Four times a day (QID) | ORAL | Status: DC | PRN

## 2013-07-30 MED ORDER — MONTELUKAST SODIUM 10 MG PO TABS: ORAL_TABLET | ORAL | Status: DC

## 2013-07-30 MED ORDER — BENZONATATE 100 MG PO CAPS: 100.0000 mg | ORAL_CAPSULE | Freq: Three times a day (TID) | ORAL | Status: DC | PRN

## 2013-07-30 MED ORDER — AZITHROMYCIN 250 MG PO TABS: ORAL_TABLET | ORAL | Status: DC

## 2013-07-30 NOTE — Progress Notes (Signed)
   Subjective:    Patient ID: [name], female    DOB: [birth year], 58 y.o.   MRN: [id number]  HPI This 58 y.o. female presents for evaluation of uri sx's with cough. She has been having some back pain.   Review of Systems C/o back pain and uri sx's. No chest pain, SOB, HA, dizziness, vision change, N/V, diarrhea, constipation, dysuria, urinary urgency or frequency, myalgias, arthralgias or rash.     Objective:   Physical Exam Vital signs noted  Well developed well nourished female.  HEENT - Head atraumatic Normocephalic                Eyes - PERRLA, Conjuctiva - clear Sclera- Clear EOMI                Ears - EAC's Wnl TM's Wnl Gross Hearing WNL                Throat - oropharanx wnl Respiratory - Lungs CTA bilateral Cardiac - RRR S1 and S2 without murmur GI - Abdomen soft Nontender and bowel sounds active x 4 Extremities - No edema. Neuro - Grossly intact. MS - TTP LS paraspinous muscles      Assessment & Plan:  Acute bronchitis - Plan: montelukast (SINGULAIR) 10 MG tablet, azithromycin (ZITHROMAX) 250 MG tablet, methylPREDNISolone acetate (DEPO-MEDROL) injection 80 mg, benzonatate (TESSALON PERLES) 100 MG capsule  Back pain - Plan: traMADol (ULTRAM) 50 MG tablet  Lysbeth Penner FNP

## 2013-08-01 ENCOUNTER — Other Ambulatory Visit: Payer: Self-pay | Admitting: Nurse Practitioner

## 2013-08-02 ENCOUNTER — Encounter: Payer: Self-pay | Admitting: Gastroenterology

## 2013-08-02 ENCOUNTER — Ambulatory Visit (INDEPENDENT_AMBULATORY_CARE_PROVIDER_SITE_OTHER): Payer: Medicare Other | Admitting: Gastroenterology

## 2013-08-02 VITALS — BP 122/82 | HR 84 | Ht 62.0 in | Wt 155.0 lb

## 2013-08-02 DIAGNOSIS — K5730 Diverticulosis of large intestine without perforation or abscess without bleeding: Secondary | ICD-10-CM

## 2013-08-02 DIAGNOSIS — R109 Unspecified abdominal pain: Secondary | ICD-10-CM

## 2013-08-02 DIAGNOSIS — R198 Other specified symptoms and signs involving the digestive system and abdomen: Secondary | ICD-10-CM | POA: Diagnosis not present

## 2013-08-02 DIAGNOSIS — K5900 Constipation, unspecified: Secondary | ICD-10-CM | POA: Diagnosis not present

## 2013-08-02 DIAGNOSIS — R194 Change in bowel habit: Secondary | ICD-10-CM

## 2013-08-02 MED ORDER — LINACLOTIDE 145 MCG PO CAPS: 145.0000 ug | ORAL_CAPSULE | Freq: Every day | ORAL | Status: DC

## 2013-08-02 MED ORDER — MOVIPREP 100 G PO SOLR: 1.0000 | Freq: Once | ORAL | Status: DC

## 2013-08-02 NOTE — Progress Notes (Signed)
     08/02/2013 [name] [id number] 1955-12-29   History of Present Illness:  This is a 58 year old female who is here for follow-up after her EGD with dilation and in regards to her previous complaints of LLQ abdominal pain/diverticulitis and constipation.    EGD by Dr. Henrene Pastor on 07/18/2013 was normal but [facility] dilation was performed.  She says that her swallowing issue is better.  See previous note from 07/17/2103 regarding the LLQ abdominal pain/diverticulitis.  At that visit she was given Augmenting 875 mg BID for 14 days.  She completed that medication and says that the pain is improved compared to what it had been but is still present.  In regards to her chronic constipation, she had been taking amitiza 8 mcg twice daily for quite some time. This seemed to not be working as well for her recently and she was given a 24 mcg dosing by her PCP. She states this seemed to be too strong and was causing her to have very watery, uncontrollable stools.  We discussed trying Linzess 145 mcg daily at her last visit but wanted to wait until she completed the antibiotics.  She would like to get some samples or a prescription for that today.  Her last colonoscopy was in May 2007 at which time she was only found have diverticulosis.  We discuss repeating colonoscopy at her last visit, particularly in the setting of ongoing issues with constipation and abdominal pain.   Current Medications, Allergies, Past Medical History, Past Surgical History, Family History and Social History were reviewed in [employer] record.   Physical Exam: BP 122/82  Pulse 84  Ht 5\' 2"  (1.575 m)  Wt 155 lb (70.308 kg)  BMI 28.34 kg/m2 General: Well developed white female in no acute distress Head: Normocephalic and atraumatic Eyes:  Sclerae anicteric, conjunctiva pink  Ears: Normal auditory acuity Lungs: Clear throughout to auscultation Heart: Regular rate and rhythm Abdomen: Soft,  non-distended.  Normal bowel sounds.  LLQ TTP without R/R/G. Rectal:  Deferred.  Will be done at the time of colonoscopy. Musculoskeletal: Symmetrical with no gross deformities  Extremities: No edema  Neurological: Alert oriented x 4, grossly non-focal Psychological:  Alert and cooperative. Normal mood and affect  Assessment and Recommendations: -Ongoing LLQ abdominal pain:  Improved, but still present after two courses of cipro and flagyl and a 14-day course of Augmenting 875 mg BID (most recent).  She has already undergone two CT scans.  We discussed that if she continued to have pain that we may want to repeat CT scan again to see if the diverticulitis cleared.  Will schedule CT scan of the abdomen and pelvis with contrast.  Will schedule colonoscopy as well for approximately 4 weeks from now, which we will allow her to proceed with as long as CT scan is ok (if it stills shows diverticulitis then will need to reschedule colonoscopy). -Chronic constipation/IBS:  Will try linzess 145 mcg daily; prescription given.  *Will schedule colonoscopy with Dr. Henrene Pastor.  The risks, benefits, and alternatives were discussed with the patient and she consents to proceed.

## 2013-08-02 NOTE — Patient Instructions (Addendum)
We have sent the following medications to your pharmacy for you to pick up at your convenience: Linzess 145 mcg, please take one tablet by mouth once daily on an empty stomach   You have been scheduled for a colonoscopy with propofol. Please follow written instructions given to you at your visit today.  Please pick up your prep kit at the pharmacy within the next 1-3 days. If you use inhalers (even only as needed), please bring them with you on the day of your procedure. Your physician has requested that you go to www.startemmi.com and enter the access code given to you at your visit today. This web site gives a general overview about your procedure. However, you should still follow specific instructions given to you by our office regarding your preparation for the procedure.  Your CT Abdomen and Pelvis is scheduled at [facility]  Appointment is 08-06-2013 at 10 am Please nothing to eat or drink 4 hours prior to CT Scan Please go to radiology department at [facility] to pick up your contrast for scan    ________________________________________________________________________

## 2013-08-02 NOTE — Addendum Note (Signed)
Addended by: Hope Pigeon A on: 08/02/2013 05:21 PM   Modules accepted: Orders

## 2013-08-03 NOTE — Progress Notes (Signed)
Agree 

## 2013-08-06 ENCOUNTER — Ambulatory Visit (HOSPITAL_COMMUNITY): Payer: Medicare Other

## 2013-08-06 DIAGNOSIS — M545 Low back pain, unspecified: Secondary | ICD-10-CM | POA: Diagnosis not present

## 2013-08-06 DIAGNOSIS — G894 Chronic pain syndrome: Secondary | ICD-10-CM | POA: Diagnosis not present

## 2013-08-06 DIAGNOSIS — Z79899 Other long term (current) drug therapy: Secondary | ICD-10-CM | POA: Diagnosis not present

## 2013-08-06 DIAGNOSIS — IMO0002 Reserved for concepts with insufficient information to code with codable children: Secondary | ICD-10-CM | POA: Diagnosis not present

## 2013-08-07 ENCOUNTER — Ambulatory Visit (HOSPITAL_COMMUNITY)
Admission: RE | Admit: 2013-08-07 | Discharge: 2013-08-07 | Disposition: A | Discharge status: Home / Self Care | Payer: Medicare Other | Source: Ambulatory Visit | Attending: Gastroenterology | Admitting: Gastroenterology

## 2013-08-07 ENCOUNTER — Encounter (HOSPITAL_COMMUNITY): Payer: Self-pay

## 2013-08-07 DIAGNOSIS — R109 Unspecified abdominal pain: Secondary | ICD-10-CM

## 2013-08-07 DIAGNOSIS — R1032 Left lower quadrant pain: Secondary | ICD-10-CM | POA: Diagnosis present

## 2013-08-07 DIAGNOSIS — K5730 Diverticulosis of large intestine without perforation or abscess without bleeding: Secondary | ICD-10-CM | POA: Diagnosis not present

## 2013-08-07 DIAGNOSIS — R194 Change in bowel habit: Secondary | ICD-10-CM

## 2013-08-07 MED ORDER — IOHEXOL 300 MG/ML  SOLN
100.0000 mL | Freq: Once | INTRAMUSCULAR | Status: AC | PRN
  Administered 2013-08-07: 100 mL via INTRAVENOUS

## 2013-08-23 ENCOUNTER — Ambulatory Visit (INDEPENDENT_AMBULATORY_CARE_PROVIDER_SITE_OTHER): Payer: Medicare Other

## 2013-08-23 ENCOUNTER — Encounter: Payer: Self-pay | Admitting: Family Medicine

## 2013-08-23 ENCOUNTER — Ambulatory Visit (INDEPENDENT_AMBULATORY_CARE_PROVIDER_SITE_OTHER): Payer: Medicare Other | Admitting: Family Medicine

## 2013-08-23 VITALS — BP 135/95 | HR 114 | Temp 97.2°F | Ht 62.0 in | Wt 157.0 lb

## 2013-08-23 DIAGNOSIS — R109 Unspecified abdominal pain: Secondary | ICD-10-CM

## 2013-08-23 DIAGNOSIS — N816 Rectocele: Secondary | ICD-10-CM | POA: Diagnosis not present

## 2013-08-23 DIAGNOSIS — R1030 Lower abdominal pain, unspecified: Secondary | ICD-10-CM

## 2013-08-23 DIAGNOSIS — F411 Generalized anxiety disorder: Secondary | ICD-10-CM | POA: Diagnosis not present

## 2013-08-23 LAB — POCT UA - MICROSCOPIC ONLY
Casts, Ur, LPF, POC: NEGATIVE
Crystals, Ur, HPF, POC: NEGATIVE
Mucus, UA: NEGATIVE
Yeast, UA: NEGATIVE

## 2013-08-23 LAB — POCT URINALYSIS DIPSTICK
Bilirubin, UA: NEGATIVE
Blood, UA: NEGATIVE
Glucose, UA: NEGATIVE
Ketones, UA: NEGATIVE
Leukocytes, UA: NEGATIVE
Nitrite, UA: NEGATIVE
Protein, UA: NEGATIVE
Spec Grav, UA: 1.01
Urobilinogen, UA: NEGATIVE
pH, UA: 5

## 2013-08-23 MED ORDER — ALPRAZOLAM 0.25 MG PO TABS: 0.2500 mg | ORAL_TABLET | Freq: Every evening | ORAL | Status: DC | PRN

## 2013-08-23 NOTE — Progress Notes (Signed)
   Subjective:    Patient ID: Teresa [name], female    DOB: 11-07-1955, 58 y.o.   MRN: [id number]  HPI  This 58 y.o. female presents for evaluation of abdominal pain and flat stools and she has hx of rectocele and she is worried that her repair has failed and that she is having problems with this again. She states she has diarrhea with the linzess and she has been taking the [place] once a day. She c/o bloating and lower abdomen discomfort.  She has hx of rectocele and she is worried that this has come back.  She did have this repaired over a year ago by a urologist and she feels like it has come back.  She does have IBS problems as well.  She is having a lot of situational stress and she  Is anxious.  Review of Systems C/o anxiety, bloating, abdominal and pelvic discomfort.   No chest pain, SOB, HA, dizziness, vision change, N/V, diarrhea, constipation, dysuria, urinary urgency or frequency, myalgias, arthralgias or rash.  Objective:   Physical Exam Vital signs noted  Well developed well nourished female.  HEENT - Head atraumatic Normocephalic                Eyes - PERRLA, Conjuctiva - clear Sclera- Clear EOMI                Ears - EAC's Wnl TM's Wnl Gross Hearing WNL                 Throat - oropharanx wnl Respiratory - Lungs CTA bilateral Cardiac - RRR S1 and S2 without murmur GI - Abdomen soft Nontender and bowel sounds active x 4 Extremities - No edema. Neuro - Grossly intact.  Results for orders placed in visit on 08/23/13  POCT URINALYSIS DIPSTICK      Result Value Ref Range   Color, UA gold     Clarity, UA clear     Glucose, UA negative     Bilirubin, UA negative     Ketones, UA negative     Spec Grav, UA 1.010     Blood, UA negative     pH, UA 5.0     Protein, UA negative     Urobilinogen, UA negative     Nitrite, UA negative     Leukocytes, UA Negative    POCT UA - MICROSCOPIC ONLY      Result Value Ref Range   WBC, Ur, HPF, POC occ     RBC, urine,  microscopic occ     Bacteria, U Microscopic few     Mucus, UA negative     Epithelial cells, urine per micros few     Crystals, Ur, HPF, POC negative     Casts, Ur, LPF, POC negative     Yeast, UA negative     KUB - Nomal bowel gas pattern Prelimnary reading by Gwyndolyn Saxon Oxford,FNP    Assessment & Plan:  Lower abdominal pain - Plan: POCT urinalysis dipstick, POCT UA - Microscopic Only, DG Abd 1 View  Anxiety - Xanax 0.25mg  one po tid prn #30 w/3  Constipation - Continue amitiza  Hx of rectocele - referral to urology  Lysbeth Penner FNP

## 2013-08-31 ENCOUNTER — Other Ambulatory Visit: Payer: Self-pay | Admitting: Family Medicine

## 2013-09-03 NOTE — Telephone Encounter (Signed)
Last seen 08/23/13  B Oxford  If approved route and print

## 2013-09-04 ENCOUNTER — Telehealth: Payer: Self-pay | Admitting: *Deleted

## 2013-09-04 ENCOUNTER — Telehealth: Payer: Self-pay | Admitting: Family Medicine

## 2013-09-04 NOTE — Telephone Encounter (Signed)
Message given, rx for pain medication ready for pick up at office.

## 2013-09-07 DIAGNOSIS — N816 Rectocele: Secondary | ICD-10-CM | POA: Diagnosis not present

## 2013-09-07 DIAGNOSIS — N3946 Mixed incontinence: Secondary | ICD-10-CM | POA: Diagnosis not present

## 2013-09-07 DIAGNOSIS — R339 Retention of urine, unspecified: Secondary | ICD-10-CM | POA: Diagnosis not present

## 2013-09-12 ENCOUNTER — Encounter: Payer: Self-pay | Admitting: Internal Medicine

## 2013-09-12 ENCOUNTER — Ambulatory Visit (AMBULATORY_SURGERY_CENTER): Payer: Medicare Other | Admitting: Internal Medicine

## 2013-09-12 VITALS — BP 119/56 | HR 83 | Temp 96.3°F | Resp 18 | Ht 62.0 in | Wt 155.0 lb

## 2013-09-12 DIAGNOSIS — D126 Benign neoplasm of colon, unspecified: Secondary | ICD-10-CM

## 2013-09-12 DIAGNOSIS — J45909 Unspecified asthma, uncomplicated: Secondary | ICD-10-CM | POA: Diagnosis not present

## 2013-09-12 DIAGNOSIS — R933 Abnormal findings on diagnostic imaging of other parts of digestive tract: Secondary | ICD-10-CM

## 2013-09-12 DIAGNOSIS — R194 Change in bowel habit: Secondary | ICD-10-CM

## 2013-09-12 DIAGNOSIS — R198 Other specified symptoms and signs involving the digestive system and abdomen: Secondary | ICD-10-CM

## 2013-09-12 DIAGNOSIS — K5900 Constipation, unspecified: Secondary | ICD-10-CM

## 2013-09-12 DIAGNOSIS — F341 Dysthymic disorder: Secondary | ICD-10-CM | POA: Diagnosis not present

## 2013-09-12 DIAGNOSIS — E669 Obesity, unspecified: Secondary | ICD-10-CM | POA: Diagnosis not present

## 2013-09-12 DIAGNOSIS — F959 Tic disorder, unspecified: Secondary | ICD-10-CM | POA: Diagnosis not present

## 2013-09-12 DIAGNOSIS — R109 Unspecified abdominal pain: Secondary | ICD-10-CM

## 2013-09-12 MED ORDER — SODIUM CHLORIDE 0.9 % IV SOLN: 500.0000 mL | INTRAVENOUS | Status: DC

## 2013-09-12 NOTE — Progress Notes (Signed)
Report to PACU, RN, vss, BBS= Clear.  

## 2013-09-12 NOTE — Progress Notes (Signed)
Called to room to assist during endoscopic procedure.  Patient ID and intended procedure confirmed with present staff. Received instructions for my participation in the procedure from the performing physician.  

## 2013-09-12 NOTE — Op Note (Signed)
[place]  [address] [place], [address]   COLONOSCOPY PROCEDURE REPORT  PATIENT: Teresa [name], Teresa [name]  MR#: [id number] BIRTHDATE: 11-26-1955 , 30  yrs. old GENDER: Female ENDOSCOPIST: Eustace Quail, MD REFERRED BY:.  Self / Office PROCEDURE DATE:  09/12/2013 PROCEDURE:   Colonoscopy with snare polypectomy First Screening Colonoscopy - Avg.  risk and is 50 yrs.  old or older - No.  Prior Negative Screening - Now for repeat screening. N/A  History of Adenoma - Now for follow-up colonoscopy & has been > or = to 3 yrs.  N/A  Polyps Removed Today? Yes. ASA CLASS:   Class II INDICATIONS:Abdominal pain and an abnormal imaging results. . Previous exam 2007 negative for neoplasia MEDICATIONS: MAC sedation, administered by CRNA and propofol (Diprivan) 300mg  IV  DESCRIPTION OF PROCEDURE:   After the risks benefits and alternatives of the procedure were thoroughly explained, informed consent was obtained.  A digital rectal exam revealed no abnormalities of the rectum.   The LB [id number]  endoscope was introduced through the anus and advanced to the cecum, which was identified by both the appendix and ileocecal valve. No adverse events experienced.   The quality of the prep was excellent, using MoviPrep  The instrument was then slowly withdrawn as the colon was fully examined.  COLON FINDINGS: Seven polyps ranging between 3-23mm in size were found at the cecum, in the ascending colon(5), and transverse colon.  A polypectomy was performed with a cold snare.  The resection was complete and the polyp tissue was completely retrieved.   Moderate diverticulosis was noted The finding was in the left colon.   The colon mucosa was otherwise normal. Retroflexed views revealed internal hemorrhoids. The time to cecum=1 minutes 46 seconds.  Withdrawal time=14 minutes 36 seconds. The scope was withdrawn and the procedure completed. COMPLICATIONS: There were no  complications.  ENDOSCOPIC IMPRESSION: 1.   Seven polyps ranging between 3-57mm in size were found in the colon; polypectomy was performed with a cold snare 2.   Moderate diverticulosis was noted in the left colon 3.   The colon mucosa was otherwise normal  RECOMMENDATIONS: Repeat Colonoscopy in 3 years.   eSigned:  Eustace Quail, MD 09/12/2013 3:10 PM   cc: The Patient    ; Stevan Born, MD   PATIENT NAME:  Teresa [name], Teresa [name] MR#: [id number]

## 2013-09-12 NOTE — Patient Instructions (Signed)
YOU HAD AN ENDOSCOPIC PROCEDURE TODAY AT THE [facility]: Refer to the procedure report that was given to you for any specific questions about what was found during the examination.  If the procedure report does not answer your questions, please call your gastroenterologist to clarify.  If you requested that your care partner not be given the details of your procedure findings, then the procedure report has been included in a sealed envelope for you to review at your convenience later.  YOU SHOULD EXPECT: Some feelings of bloating in the abdomen. Passage of more gas than usual.  Walking can help get rid of the air that was put into your GI tract during the procedure and reduce the bloating. If you had a lower endoscopy (such as a colonoscopy or flexible sigmoidoscopy) you may notice spotting of blood in your stool or on the toilet paper. If you underwent a bowel prep for your procedure, then you may not have a normal bowel movement for a few days.  DIET: Your first meal following the procedure should be a light meal and then it is ok to progress to your normal diet.  A half-sandwich or bowl of soup is an example of a good first meal.  Heavy or fried foods are harder to digest and may make you feel nauseous or bloated.  Likewise meals heavy in dairy and vegetables can cause extra gas to form and this can also increase the bloating.  Drink plenty of fluids but you should avoid alcoholic beverages for 24 hours.  ACTIVITY: Your care partner should take you home directly after the procedure.  You should plan to take it easy, moving slowly for the rest of the day.  You can resume normal activity the day after the procedure however you should NOT DRIVE or use heavy machinery for 24 hours (because of the sedation medicines used during the test).    SYMPTOMS TO REPORT IMMEDIATELY: A gastroenterologist can be reached at any hour.  During normal business hours, 8:30 AM to 5:00 PM Monday through Friday,  call [phone].  After hours and on weekends, please call the GI answering service at [phone] who will take a message and have the physician on call contact you.   Following lower endoscopy (colonoscopy or flexible sigmoidoscopy):  Excessive amounts of blood in the stool  Significant tenderness or worsening of abdominal pains  Swelling of the abdomen that is new, acute  Fever of 100F or higher    FOLLOW UP: If any biopsies were taken you will be contacted by phone or by letter within the next 1-3 weeks.  Call your gastroenterologist if you have not heard about the biopsies in 3 weeks.  Our staff will call the home number listed on your records the next business day following your procedure to check on you and address any questions or concerns that you may have at that time regarding the information given to you following your procedure. This is a courtesy call and so if there is no answer at the home number and we have not heard from you through the emergency physician on call, we will assume that you have returned to your regular daily activities without incident.  SIGNATURES/CONFIDENTIALITY: You and/or your care partner have signed paperwork which will be entered into your electronic medical record.  These signatures attest to the fact that that the information above on your After Visit Summary has been reviewed and is understood.  Full responsibility of the confidentiality   of this discharge information lies with you and/or your care-partner.   INFORMATION ON POLYPS,DIVERTICULOSIS , AND HIGH FIBER DIET GIVEN TO YOU TODAY 

## 2013-09-13 ENCOUNTER — Telehealth: Payer: Self-pay | Admitting: *Deleted

## 2013-09-13 NOTE — Telephone Encounter (Signed)
Left message that we called for f/u 

## 2013-09-17 ENCOUNTER — Encounter: Payer: Self-pay | Admitting: Internal Medicine

## 2013-10-02 ENCOUNTER — Other Ambulatory Visit: Payer: Self-pay | Admitting: Family Medicine

## 2013-10-03 NOTE — Telephone Encounter (Signed)
Patient last seen in office on 08-23-13. Rx last filled on 09-06-13 for #30. Please advise. If approved please print and route to pool A so nurse can call patient to pick up

## 2013-10-08 ENCOUNTER — Other Ambulatory Visit: Payer: Self-pay | Admitting: Family Medicine

## 2013-10-08 DIAGNOSIS — N816 Rectocele: Secondary | ICD-10-CM

## 2013-10-08 DIAGNOSIS — F411 Generalized anxiety disorder: Secondary | ICD-10-CM

## 2013-10-08 DIAGNOSIS — R1030 Lower abdominal pain, unspecified: Secondary | ICD-10-CM

## 2013-10-08 MED ORDER — TRAMADOL HCL 50 MG PO TABS: 50.0000 mg | ORAL_TABLET | Freq: Four times a day (QID) | ORAL | Status: DC | PRN

## 2013-10-08 MED ORDER — ALPRAZOLAM 0.25 MG PO TABS: 0.2500 mg | ORAL_TABLET | Freq: Every evening | ORAL | Status: DC | PRN

## 2013-10-22 DIAGNOSIS — IMO0002 Reserved for concepts with insufficient information to code with codable children: Secondary | ICD-10-CM | POA: Diagnosis not present

## 2013-10-22 DIAGNOSIS — R0602 Shortness of breath: Secondary | ICD-10-CM | POA: Diagnosis not present

## 2013-10-22 DIAGNOSIS — F329 Major depressive disorder, single episode, unspecified: Secondary | ICD-10-CM | POA: Diagnosis not present

## 2013-10-22 DIAGNOSIS — J42 Unspecified chronic bronchitis: Secondary | ICD-10-CM | POA: Diagnosis not present

## 2013-10-22 DIAGNOSIS — F411 Generalized anxiety disorder: Secondary | ICD-10-CM | POA: Diagnosis not present

## 2013-10-22 DIAGNOSIS — Z79899 Other long term (current) drug therapy: Secondary | ICD-10-CM | POA: Diagnosis not present

## 2013-10-22 DIAGNOSIS — L821 Other seborrheic keratosis: Secondary | ICD-10-CM | POA: Diagnosis not present

## 2013-10-22 DIAGNOSIS — F3289 Other specified depressive episodes: Secondary | ICD-10-CM | POA: Diagnosis not present

## 2013-10-22 DIAGNOSIS — R079 Chest pain, unspecified: Secondary | ICD-10-CM | POA: Diagnosis not present

## 2013-10-22 DIAGNOSIS — L570 Actinic keratosis: Secondary | ICD-10-CM | POA: Diagnosis not present

## 2013-10-22 DIAGNOSIS — Z8249 Family history of ischemic heart disease and other diseases of the circulatory system: Secondary | ICD-10-CM | POA: Diagnosis not present

## 2013-10-22 DIAGNOSIS — K219 Gastro-esophageal reflux disease without esophagitis: Secondary | ICD-10-CM | POA: Diagnosis not present

## 2013-10-22 DIAGNOSIS — K589 Irritable bowel syndrome without diarrhea: Secondary | ICD-10-CM | POA: Diagnosis not present

## 2013-10-23 DIAGNOSIS — R079 Chest pain, unspecified: Secondary | ICD-10-CM | POA: Diagnosis not present

## 2013-10-31 ENCOUNTER — Encounter: Payer: Self-pay | Admitting: Internal Medicine

## 2013-10-31 ENCOUNTER — Encounter: Payer: Self-pay | Admitting: Gastroenterology

## 2013-10-31 ENCOUNTER — Telehealth: Payer: Self-pay | Admitting: Family Medicine

## 2013-10-31 NOTE — Telephone Encounter (Signed)
Patient aware we are out of appointments for today.

## 2013-11-01 ENCOUNTER — Other Ambulatory Visit: Payer: Self-pay | Admitting: Nurse Practitioner

## 2013-11-06 DIAGNOSIS — G5600 Carpal tunnel syndrome, unspecified upper limb: Secondary | ICD-10-CM | POA: Diagnosis not present

## 2013-11-06 DIAGNOSIS — M545 Low back pain, unspecified: Secondary | ICD-10-CM | POA: Diagnosis not present

## 2013-11-06 DIAGNOSIS — G8929 Other chronic pain: Secondary | ICD-10-CM | POA: Diagnosis not present

## 2013-11-06 DIAGNOSIS — Z79899 Other long term (current) drug therapy: Secondary | ICD-10-CM | POA: Diagnosis not present

## 2013-11-08 ENCOUNTER — Ambulatory Visit (INDEPENDENT_AMBULATORY_CARE_PROVIDER_SITE_OTHER): Payer: Medicare Other | Admitting: Family Medicine

## 2013-11-08 VITALS — BP 132/87 | HR 88 | Temp 97.7°F | Wt 160.0 lb

## 2013-11-08 DIAGNOSIS — J069 Acute upper respiratory infection, unspecified: Secondary | ICD-10-CM

## 2013-11-08 MED ORDER — METHYLPREDNISOLONE ACETATE 80 MG/ML IJ SUSP: 80.0000 mg | Freq: Once | INTRAMUSCULAR | Status: DC

## 2013-11-08 MED ORDER — AZITHROMYCIN 250 MG PO TABS: ORAL_TABLET | ORAL | Status: DC

## 2013-11-08 NOTE — Progress Notes (Signed)
   Subjective:    Patient ID: Teresa [name], female    DOB: 11/03/1955, 58 y.o.   MRN: [id number]  HPI This 58 y.o. female presents for evaluation of uri.   Review of Systems No chest pain, SOB, HA, dizziness, vision change, N/V, diarrhea, constipation, dysuria, urinary urgency or frequency, myalgias, arthralgias or rash.     Objective:   Physical Exam  Vital signs noted  Well developed well nourished female.  HEENT - Head atraumatic Normocephalic                Eyes - PERRLA, Conjuctiva - clear Sclera- Clear EOMI                Ears - EAC's Wnl TM's Wnl Gross Hearing WNL                Nose - Nares patent                 Throat - oropharanx wnl Respiratory - Lungs CTA bilateral Cardiac - RRR S1 and S2 without murmur GI - Abdomen soft Nontender and bowel sounds active x 4 Extremities - No edema. Neuro - Grossly intact.      Assessment & Plan:  URI (upper respiratory infection) - Plan: azithromycin (ZITHROMAX) 250 MG tablet, methylPREDNISolone acetate (DEPO-MEDROL) injection 80 mg  Lysbeth Penner FNP

## 2013-11-29 ENCOUNTER — Ambulatory Visit (INDEPENDENT_AMBULATORY_CARE_PROVIDER_SITE_OTHER): Payer: Medicare Other | Admitting: Family Medicine

## 2013-11-29 ENCOUNTER — Encounter: Payer: Self-pay | Admitting: Family Medicine

## 2013-11-29 VITALS — BP 115/72 | HR 85 | Temp 97.2°F | Ht 62.0 in | Wt 157.0 lb

## 2013-11-29 DIAGNOSIS — G933 Postviral fatigue syndrome: Secondary | ICD-10-CM | POA: Diagnosis not present

## 2013-11-29 DIAGNOSIS — D649 Anemia, unspecified: Secondary | ICD-10-CM | POA: Diagnosis not present

## 2013-11-29 DIAGNOSIS — E559 Vitamin D deficiency, unspecified: Secondary | ICD-10-CM | POA: Diagnosis not present

## 2013-11-29 DIAGNOSIS — R5383 Other fatigue: Secondary | ICD-10-CM | POA: Diagnosis not present

## 2013-11-29 DIAGNOSIS — E162 Hypoglycemia, unspecified: Secondary | ICD-10-CM | POA: Diagnosis not present

## 2013-11-29 DIAGNOSIS — R739 Hyperglycemia, unspecified: Secondary | ICD-10-CM

## 2013-11-29 DIAGNOSIS — K5720 Diverticulitis of large intestine with perforation and abscess without bleeding: Secondary | ICD-10-CM | POA: Diagnosis not present

## 2013-11-29 LAB — GLUCOSE, POCT (MANUAL RESULT ENTRY): POC Glucose: 110 mg/dl — AB (ref 70–99)

## 2013-11-29 LAB — POCT GLYCOSYLATED HEMOGLOBIN (HGB A1C): Hemoglobin A1C: 5.6

## 2013-11-29 MED ORDER — METRONIDAZOLE 500 MG PO TABS: 500.0000 mg | ORAL_TABLET | Freq: Two times a day (BID) | ORAL | Status: DC

## 2013-11-29 MED ORDER — CIPROFLOXACIN HCL 500 MG PO TABS: 500.0000 mg | ORAL_TABLET | Freq: Two times a day (BID) | ORAL | Status: DC

## 2013-11-29 MED ORDER — OXYCODONE-ACETAMINOPHEN 10-325 MG PO TABS: 1.0000 | ORAL_TABLET | ORAL | Status: DC | PRN

## 2013-11-29 NOTE — Progress Notes (Signed)
   Subjective:    Patient ID: Teresa [name], female    DOB: 1955-09-21, 58 y.o.   MRN: [id number]  HPI This 58 y.o. female presents for evaluation of fatigue and abdominal pain.  She describes the pain as severe.  She has been hurting in her LLQ of her abdomen.  She c/o fatigue that started before the abdominal pain.  She c/o hypoglycemia and eats a candy bar and feels better.  She has had hx of elevated non fasting blood sugars.   Review of Systems C/o abdominal pain and fatigue No chest pain, SOB, HA, dizziness, vision change, N/V, diarrhea, constipation, dysuria, urinary urgency or frequency, myalgias, arthralgias or rash.     Objective:   Physical Exam  Vital signs noted  Well developed well nourished female.  HEENT - Head atraumatic Normocephalic                Eyes - PERRLA, Conjuctiva - clear Sclera- Clear EOMI                Ears - EAC's Wnl TM's Wnl Gross Hearing WNL                Throat - oropharanx wnl Respiratory - Lungs CTA bilateral Cardiac - RRR S1 and S2 without murmur GI - Abdomen soft tender LLQ and bowel sounds active x 4 Extremities - No edema. Neuro - Grossly intact.      Assessment & Plan:  Diverticulitis of large intestine with perforation without bleeding - Plan: metroNIDAZOLE (FLAGYL) 500 MG tablet, ciprofloxacin (CIPRO) 500 MG tablet, oxyCODONE-acetaminophen (PERCOCET) 10-325 MG per tablet  Other fatigue - Plan: POCT glucose (manual entry), CMP14+EGFR, Vitamin B12, Vit D  25 hydroxy (rtn osteoporosis monitoring), TSH  Hypoglycemia - Plan: POCT glycosylated hemoglobin (Hb A1C), POCT glucose (manual entry)  Hyperglycemia - Plan: POCT glycosylated hemoglobin (Hb A1C), POCT glucose (manual entry)  [place] FNP

## 2013-11-30 ENCOUNTER — Telehealth: Payer: Self-pay | Admitting: Family Medicine

## 2013-11-30 LAB — CMP14+EGFR
ALT: 36 IU/L — ABNORMAL HIGH (ref 0–32)
AST: 28 IU/L (ref 0–40)
Albumin/Globulin Ratio: 1.8 (ref 1.1–2.5)
Albumin: 4.6 g/dL (ref 3.5–5.5)
Alkaline Phosphatase: 84 IU/L (ref 39–117)
BUN/Creatinine Ratio: 14 (ref 9–23)
BUN: 12 mg/dL (ref 6–24)
CO2: 26 mmol/L (ref 18–29)
Calcium: 9.2 mg/dL (ref 8.7–10.2)
Chloride: 100 mmol/L (ref 97–108)
Creatinine, Ser: 0.83 mg/dL (ref 0.57–1.00)
GFR calc Af Amer: 92 mL/min/{1.73_m2} (ref >59)
GFR calc non Af Amer: 80 mL/min/{1.73_m2} (ref >59)
Globulin, Total: 2.5 g/dL (ref 1.5–4.5)
Glucose: 105 mg/dL — ABNORMAL HIGH (ref 65–99)
Potassium: 3.8 mmol/L (ref 3.5–5.2)
Sodium: 141 mmol/L (ref 134–144)
Total Bilirubin: 1.5 mg/dL — ABNORMAL HIGH (ref 0.0–1.2)
Total Protein: 7.1 g/dL (ref 6.0–8.5)

## 2013-11-30 LAB — VITAMIN D 25 HYDROXY (VIT D DEFICIENCY, FRACTURES): Vit D, 25-Hydroxy: 41 ng/mL (ref 30.0–100.0)

## 2013-11-30 LAB — VITAMIN B12: Vitamin B-12: 981 pg/mL — ABNORMAL HIGH (ref 211–946)

## 2013-11-30 LAB — TSH: TSH: 1.87 u[IU]/mL (ref 0.450–4.500)

## 2013-11-30 NOTE — Telephone Encounter (Signed)
appt given for wed 10/14 with [facility]

## 2013-11-30 NOTE — Telephone Encounter (Signed)
Message copied by Waverly Ferrari on Fri Nov 30, 2013  4:10 PM ------      Message from: Lysbeth Penner      Created: Fri Nov 30, 2013  3:39 PM       Labs look good ------

## 2013-12-01 ENCOUNTER — Telehealth: Payer: Self-pay | Admitting: Family Medicine

## 2013-12-01 ENCOUNTER — Encounter: Payer: Self-pay | Admitting: *Deleted

## 2013-12-01 NOTE — Telephone Encounter (Signed)
Increased abd pain, dizziness, gas, chest pain and muscle cramps. Patient did not the Flagyl or Cipro this morning and she doesn't want to take anymore.  Symptoms have mostly resolved since she hasn't taken them this morning.  Asked her to hold these meds until a provider gives her directions. She will seek care at the ER if pain worsens.

## 2013-12-03 ENCOUNTER — Encounter: Payer: Self-pay | Admitting: Family

## 2013-12-03 ENCOUNTER — Ambulatory Visit (INDEPENDENT_AMBULATORY_CARE_PROVIDER_SITE_OTHER): Payer: Medicare Other | Admitting: Family

## 2013-12-03 VITALS — BP 135/88 | HR 104 | Temp 97.0°F | Ht 62.0 in | Wt 158.0 lb

## 2013-12-03 DIAGNOSIS — J0100 Acute maxillary sinusitis, unspecified: Secondary | ICD-10-CM

## 2013-12-03 MED ORDER — AMOXICILLIN-POT CLAVULANATE 875-125 MG PO TABS: 1.0000 | ORAL_TABLET | Freq: Two times a day (BID) | ORAL | Status: DC

## 2013-12-03 NOTE — Patient Instructions (Addendum)
Sinusitis Sinusitis is redness, soreness, and inflammation of the paranasal sinuses. Paranasal sinuses are air pockets within the bones of your face (beneath the eyes, the middle of the forehead, or above the eyes). In healthy paranasal sinuses, mucus is able to drain out, and air is able to circulate through them by way of your nose. However, when your paranasal sinuses are inflamed, mucus and air can become trapped. This can allow bacteria and other germs to grow and cause infection. Sinusitis can develop quickly and last only a short time (acute) or continue over a long period (chronic). Sinusitis that lasts for more than 12 weeks is considered chronic.  CAUSES  Causes of sinusitis include:  Allergies.  Structural abnormalities, such as displacement of the cartilage that separates your nostrils (deviated septum), which can decrease the air flow through your nose and sinuses and affect sinus drainage.  Functional abnormalities, such as when the small hairs (cilia) that line your sinuses and help remove mucus do not work properly or are not present. SIGNS AND SYMPTOMS  Symptoms of acute and chronic sinusitis are the same. The primary symptoms are pain and pressure around the affected sinuses. Other symptoms include:  Upper toothache.  Earache.  Headache.  Bad breath.  Decreased sense of smell and taste.  A cough, which worsens when you are lying flat.  Fatigue.  Fever.  Thick drainage from your nose, which often is green and may contain pus (purulent).  Swelling and warmth over the affected sinuses. DIAGNOSIS  Your health care provider will perform a physical exam. During the exam, your health care provider may:  Look in your nose for signs of abnormal growths in your nostrils (nasal polyps).  Tap over the affected sinus to check for signs of infection.  View the inside of your sinuses (endoscopy) using an imaging device that has a light attached (endoscope). If your health  care provider suspects that you have chronic sinusitis, one or more of the following tests may be recommended:  Allergy tests.  Nasal culture. A sample of mucus is taken from your nose, sent to a lab, and screened for bacteria.  Nasal cytology. A sample of mucus is taken from your nose and examined by your health care provider to determine if your sinusitis is related to an allergy. TREATMENT  Most cases of acute sinusitis are related to a viral infection and will resolve on their own within 10 days. Sometimes medicines are prescribed to help relieve symptoms (pain medicine, decongestants, nasal steroid sprays, or saline sprays).  However, for sinusitis related to a bacterial infection, your health care provider will prescribe antibiotic medicines. These are medicines that will help kill the bacteria causing the infection.  Rarely, sinusitis is caused by a fungal infection. In theses cases, your health care provider will prescribe antifungal medicine. For some cases of chronic sinusitis, surgery is needed. Generally, these are cases in which sinusitis recurs more than 3 times per year, despite other treatments. HOME CARE INSTRUCTIONS   Drink plenty of water. Water helps thin the mucus so your sinuses can drain more easily.  Use a humidifier.  Inhale steam 3 to 4 times a day (for example, sit in the bathroom with the shower running).  Apply a warm, moist washcloth to your face 3 to 4 times a day, or as directed by your health care provider.  Use saline nasal sprays to help moisten and clean your sinuses.  Take medicines only as directed by your health care provider.    If you were prescribed either an antibiotic or antifungal medicine, finish it all even if you start to feel better. SEEK IMMEDIATE MEDICAL CARE IF:  You have increasing pain or severe headaches.  You have nausea, vomiting, or drowsiness.  You have swelling around your face.  You have vision problems.  You have a stiff  neck.  You have difficulty breathing. MAKE SURE YOU:   Understand these instructions.  Will watch your condition.  Will get help right away if you are not doing well or get worse. Document Released: 02/15/2005 Document Revised: 07/02/2013 Document Reviewed: 03/02/2011 Exit[employer] Patient Information 2015 [employer]. This information is not intended to replace advice given to you by your health care provider. Make sure you discuss any questions you have with your health care provider.  - Take meds as prescribed - Use a cool mist humidifier  -Use saline nose sprays frequently -Saline irrigations of the nose can be very helpful if done frequently.  * 4X daily for 1 week*  * Use of a nettie pot can be helpful with this. Follow directions with this* -Force fluids -For any cough or congestion  Use plain Mucinex- regular strength or max strength is fine   * Children- consult with Pharmacist for dosing -For fever or aces or pains- take tylenol or ibuprofen appropriate for age and weight.  * for fevers greater than 101 orally you may alternate ibuprofen and tylenol every  3 hours. -Throat lozenges if help   Mahamud Metts, FNP  

## 2013-12-03 NOTE — Progress Notes (Signed)
Subjective:    Patient ID: Teresa [name], female    DOB: [birth year], 58 y.o.   MRN: [id number]  Sinusitis This is a new problem. The current episode started 1 to 4 weeks ago ("about a week"). The problem has been gradually worsening since onset. There has been no fever. Her pain is at a severity of 6/10. The pain is moderate. Associated symptoms include chills, coughing, ear pain, headaches, sinus pressure and a sore throat. Pertinent negatives include no shortness of breath or sneezing. Past treatments include antibiotics and oral decongestants. The treatment provided mild relief.   *Pt was seen last Thursday for Diverticulitis and was started on Flagy and Cipro. Pt states the medications gave her "flu like symptoms" with abd pain, dizziness, and muscle cramps. Pt stopped taking them on Saturday and has felt better.  Pt states she is still having mild abd pain, but denies any fever.   Review of Systems  Constitutional: Positive for chills.  HENT: Positive for ear pain, sinus pressure and sore throat. Negative for sneezing.   Eyes: Negative.   Respiratory: Positive for cough. Negative for shortness of breath.   Cardiovascular: Negative.  Negative for palpitations.  Gastrointestinal: Negative.   Endocrine: Negative.   Genitourinary: Negative.   Musculoskeletal: Negative.   Neurological: Positive for headaches.  Hematological: Negative.   Psychiatric/Behavioral: Negative.   All other systems reviewed and are negative.      Objective:   Physical Exam  Vitals reviewed. Constitutional: She is oriented to person, place, and time. She appears well-developed and well-nourished. No distress.  HENT:  Head: Normocephalic and atraumatic.  Right Ear: External ear normal.  Left Ear: External ear normal.  Nose: Right sinus exhibits maxillary sinus tenderness and frontal sinus tenderness. Left sinus exhibits maxillary sinus tenderness and frontal sinus tenderness.  Nasal passage mildly  erythemas  Oropharynx erythemas   Eyes: Pupils are equal, round, and reactive to light.  Neck: Normal range of motion. Neck supple. No thyromegaly present.  Cardiovascular: Normal rate, regular rhythm, normal heart sounds and intact distal pulses.   No murmur heard. Pulmonary/Chest: Effort normal and breath sounds normal. No respiratory distress. She has no wheezes.  Abdominal: Soft. Bowel sounds are normal. She exhibits no distension. There is no tenderness.  Musculoskeletal: Normal range of motion. She exhibits no edema and no tenderness.  Neurological: She is alert and oriented to person, place, and time. She has normal reflexes. No cranial nerve deficit.  Skin: Skin is warm and dry.  Psychiatric: She has a normal mood and affect. Her behavior is normal. Judgment and thought content normal.    BP 135/88  Pulse 104  Temp(Src) 97 F (36.1 C) (Oral)  Ht 5\' 2"  (1.575 m)  Wt 158 lb (71.668 kg)  BMI 28.89 kg/m2       Assessment & Plan:  1. Acute maxillary sinusitis, recurrence not specified -- Take meds as prescribed - Use a cool mist humidifier  -Use saline nose sprays frequently -Saline irrigations of the nose can be very helpful if done frequently.  * 4X daily for 1 week*  * Use of a nettie pot can be helpful with this. Follow directions with this* -Force fluids -For any cough or congestion  Use plain Mucinex- regular strength or max strength is fine   * Children- consult with Pharmacist for dosing -For fever or aces or pains- take tylenol or ibuprofen appropriate for age and weight.  * for fevers greater than 101 orally you may alternate  ibuprofen and tylenol every  3 hours. -Throat lozenges if help -Pt states she has taken amoxicillin in past with no adverse reaction - amoxicillin-clavulanate (AUGMENTIN) 875-125 MG per tablet; Take 1 tablet by mouth 2 (two) times daily.  Dispense: 14 tablet; Refill: 0  Evelina Dun, FNP

## 2013-12-12 ENCOUNTER — Emergency Department (HOSPITAL_COMMUNITY): Payer: Medicare Other

## 2013-12-12 ENCOUNTER — Encounter: Payer: Self-pay | Admitting: Family Medicine

## 2013-12-12 ENCOUNTER — Emergency Department (HOSPITAL_COMMUNITY)
Admission: EM | Admit: 2013-12-12 | Discharge: 2013-12-12 | Disposition: A | Discharge status: Home / Self Care | Payer: Medicare Other | Attending: Emergency Medicine | Admitting: Emergency Medicine

## 2013-12-12 ENCOUNTER — Ambulatory Visit (INDEPENDENT_AMBULATORY_CARE_PROVIDER_SITE_OTHER): Payer: Medicare Other | Admitting: Family Medicine

## 2013-12-12 ENCOUNTER — Encounter (HOSPITAL_COMMUNITY): Payer: Self-pay | Admitting: Emergency Medicine

## 2013-12-12 VITALS — BP 129/88 | HR 102 | Temp 97.5°F | Ht 62.0 in | Wt 157.0 lb

## 2013-12-12 DIAGNOSIS — R079 Chest pain, unspecified: Secondary | ICD-10-CM | POA: Diagnosis not present

## 2013-12-12 DIAGNOSIS — Z7951 Long term (current) use of inhaled steroids: Secondary | ICD-10-CM | POA: Insufficient documentation

## 2013-12-12 DIAGNOSIS — K5720 Diverticulitis of large intestine with perforation and abscess without bleeding: Secondary | ICD-10-CM | POA: Diagnosis not present

## 2013-12-12 DIAGNOSIS — Z792 Long term (current) use of antibiotics: Secondary | ICD-10-CM | POA: Insufficient documentation

## 2013-12-12 DIAGNOSIS — Z9109 Other allergy status, other than to drugs and biological substances: Secondary | ICD-10-CM

## 2013-12-12 DIAGNOSIS — J45909 Unspecified asthma, uncomplicated: Secondary | ICD-10-CM | POA: Insufficient documentation

## 2013-12-12 DIAGNOSIS — Z7982 Long term (current) use of aspirin: Secondary | ICD-10-CM | POA: Insufficient documentation

## 2013-12-12 DIAGNOSIS — Z91048 Other nonmedicinal substance allergy status: Secondary | ICD-10-CM | POA: Diagnosis not present

## 2013-12-12 DIAGNOSIS — G459 Transient cerebral ischemic attack, unspecified: Secondary | ICD-10-CM

## 2013-12-12 DIAGNOSIS — Z7952 Long term (current) use of systemic steroids: Secondary | ICD-10-CM | POA: Insufficient documentation

## 2013-12-12 DIAGNOSIS — M549 Dorsalgia, unspecified: Secondary | ICD-10-CM | POA: Insufficient documentation

## 2013-12-12 DIAGNOSIS — Z87891 Personal history of nicotine dependence: Secondary | ICD-10-CM | POA: Insufficient documentation

## 2013-12-12 DIAGNOSIS — Z8739 Personal history of other diseases of the musculoskeletal system and connective tissue: Secondary | ICD-10-CM | POA: Diagnosis not present

## 2013-12-12 DIAGNOSIS — Z8719 Personal history of other diseases of the digestive system: Secondary | ICD-10-CM | POA: Insufficient documentation

## 2013-12-12 DIAGNOSIS — F329 Major depressive disorder, single episode, unspecified: Secondary | ICD-10-CM | POA: Diagnosis not present

## 2013-12-12 DIAGNOSIS — G44209 Tension-type headache, unspecified, not intractable: Secondary | ICD-10-CM

## 2013-12-12 DIAGNOSIS — R208 Other disturbances of skin sensation: Secondary | ICD-10-CM

## 2013-12-12 DIAGNOSIS — Z79899 Other long term (current) drug therapy: Secondary | ICD-10-CM | POA: Insufficient documentation

## 2013-12-12 DIAGNOSIS — J189 Pneumonia, unspecified organism: Secondary | ICD-10-CM | POA: Diagnosis not present

## 2013-12-12 DIAGNOSIS — R109 Unspecified abdominal pain: Secondary | ICD-10-CM | POA: Diagnosis not present

## 2013-12-12 DIAGNOSIS — R200 Anesthesia of skin: Secondary | ICD-10-CM

## 2013-12-12 DIAGNOSIS — G8929 Other chronic pain: Secondary | ICD-10-CM | POA: Insufficient documentation

## 2013-12-12 DIAGNOSIS — J159 Unspecified bacterial pneumonia: Secondary | ICD-10-CM | POA: Insufficient documentation

## 2013-12-12 DIAGNOSIS — R531 Weakness: Secondary | ICD-10-CM | POA: Insufficient documentation

## 2013-12-12 DIAGNOSIS — R0789 Other chest pain: Secondary | ICD-10-CM | POA: Diagnosis not present

## 2013-12-12 DIAGNOSIS — M6281 Muscle weakness (generalized): Secondary | ICD-10-CM | POA: Diagnosis not present

## 2013-12-12 DIAGNOSIS — F411 Generalized anxiety disorder: Secondary | ICD-10-CM | POA: Diagnosis not present

## 2013-12-12 DIAGNOSIS — R918 Other nonspecific abnormal finding of lung field: Secondary | ICD-10-CM | POA: Diagnosis not present

## 2013-12-12 DIAGNOSIS — R05 Cough: Secondary | ICD-10-CM | POA: Diagnosis not present

## 2013-12-12 DIAGNOSIS — R0602 Shortness of breath: Secondary | ICD-10-CM | POA: Diagnosis not present

## 2013-12-12 DIAGNOSIS — R1032 Left lower quadrant pain: Secondary | ICD-10-CM | POA: Diagnosis not present

## 2013-12-12 DIAGNOSIS — Z8742 Personal history of other diseases of the female genital tract: Secondary | ICD-10-CM | POA: Diagnosis not present

## 2013-12-12 DIAGNOSIS — R42 Dizziness and giddiness: Secondary | ICD-10-CM | POA: Diagnosis not present

## 2013-12-12 DIAGNOSIS — R51 Headache: Secondary | ICD-10-CM | POA: Diagnosis not present

## 2013-12-12 LAB — POCT URINALYSIS DIPSTICK
Bilirubin, UA: NEGATIVE
Blood, UA: NEGATIVE
Glucose, UA: NEGATIVE
Ketones, UA: NEGATIVE
Leukocytes, UA: NEGATIVE
Nitrite, UA: NEGATIVE
Protein, UA: NEGATIVE
Spec Grav, UA: <=1.005
Urobilinogen, UA: NEGATIVE
pH, UA: 6.5

## 2013-12-12 LAB — POCT CBC
Granulocyte percent: 70 %G (ref 37–80)
HCT, POC: 43.3 % (ref 37.7–47.9)
Hemoglobin: 13.9 g/dL (ref 12.2–16.2)
Lymph, poc: 1.7 (ref 0.6–3.4)
MCH, POC: 26.4 pg — AB (ref 27–31.2)
MCHC: 32.1 g/dL (ref 31.8–35.4)
MCV: 82.3 fL (ref 80–97)
MPV: 7.4 fL (ref 0–99.8)
POC Granulocyte: 5 (ref 2–6.9)
POC LYMPH PERCENT: 23.7 %L (ref 10–50)
Platelet Count, POC: 208 10*3/uL (ref 142–424)
RBC: 5.3 M/uL (ref 4.04–5.48)
RDW, POC: 14.5 %
WBC: 7.2 10*3/uL (ref 4.6–10.2)

## 2013-12-12 LAB — CBC WITH DIFFERENTIAL/PLATELET
Basophils Absolute: 0 10*3/uL (ref 0.0–0.1)
Basophils Relative: 0 % (ref 0–1)
Eosinophils Absolute: 0.1 10*3/uL (ref 0.0–0.7)
Eosinophils Relative: 1 % (ref 0–5)
HCT: 40.4 % (ref 36.0–46.0)
HEMOGLOBIN: 13.5 g/dL (ref 12.0–15.0)
LYMPHS ABS: 1.8 10*3/uL (ref 0.7–4.0)
LYMPHS PCT: 26 % (ref 12–46)
MCH: 27.2 pg (ref 26.0–34.0)
MCHC: 33.4 g/dL (ref 30.0–36.0)
MCV: 81.3 fL (ref 78.0–100.0)
MONOS PCT: 7 % (ref 3–12)
Monocytes Absolute: 0.5 10*3/uL (ref 0.1–1.0)
NEUTROS PCT: 66 % (ref 43–77)
Neutro Abs: 4.5 10*3/uL (ref 1.7–7.7)
PLATELETS: 204 10*3/uL (ref 150–400)
RBC: 4.97 MIL/uL (ref 3.87–5.11)
RDW: 14 % (ref 11.5–15.5)
WBC: 7 10*3/uL (ref 4.0–10.5)

## 2013-12-12 LAB — POCT UA - MICROSCOPIC ONLY
Bacteria, U Microscopic: NEGATIVE
Casts, Ur, LPF, POC: NEGATIVE
Crystals, Ur, HPF, POC: NEGATIVE
Mucus, UA: NEGATIVE
RBC, urine, microscopic: NEGATIVE
WBC, Ur, HPF, POC: NEGATIVE
Yeast, UA: NEGATIVE

## 2013-12-12 LAB — URINALYSIS, ROUTINE W REFLEX MICROSCOPIC
BILIRUBIN URINE: NEGATIVE
Glucose, UA: NEGATIVE mg/dL
HGB URINE DIPSTICK: NEGATIVE
Ketones, ur: NEGATIVE mg/dL
Leukocytes, UA: NEGATIVE
Nitrite: NEGATIVE
PROTEIN: NEGATIVE mg/dL
Specific Gravity, Urine: 1.01 (ref 1.005–1.030)
Urobilinogen, UA: 0.2 mg/dL (ref 0.0–1.0)
pH: 6.5 (ref 5.0–8.0)

## 2013-12-12 LAB — COMPREHENSIVE METABOLIC PANEL
ALK PHOS: 71 U/L (ref 39–117)
ALT: 40 U/L — AB (ref 0–35)
AST: 34 U/L (ref 0–37)
Albumin: 4 g/dL (ref 3.5–5.2)
Anion gap: 11 (ref 5–15)
BILIRUBIN TOTAL: 0.9 mg/dL (ref 0.3–1.2)
BUN: 8 mg/dL (ref 6–23)
CHLORIDE: 104 meq/L (ref 96–112)
CO2: 29 meq/L (ref 19–32)
Calcium: 9.2 mg/dL (ref 8.4–10.5)
Creatinine, Ser: 0.78 mg/dL (ref 0.50–1.10)
GFR calc non Af Amer: >90 mL/min (ref >90)
GLUCOSE: 83 mg/dL (ref 70–99)
Potassium: 3.8 mEq/L (ref 3.7–5.3)
SODIUM: 144 meq/L (ref 137–147)
Total Protein: 7.6 g/dL (ref 6.0–8.3)

## 2013-12-12 LAB — TROPONIN I: Troponin I: <0.3 ng/mL (ref <0.30)

## 2013-12-12 MED ORDER — CIPROFLOXACIN HCL 500 MG PO TABS: 500.0000 mg | ORAL_TABLET | Freq: Two times a day (BID) | ORAL | Status: DC

## 2013-12-12 MED ORDER — LEVOFLOXACIN 500 MG PO TABS: 500.0000 mg | ORAL_TABLET | Freq: Every day | ORAL | Status: DC

## 2013-12-12 MED ORDER — ONDANSETRON 4 MG PO TBDP: ORAL_TABLET | ORAL | Status: DC

## 2013-12-12 MED ORDER — OXYCODONE-ACETAMINOPHEN 10-325 MG PO TABS
1.0000 | ORAL_TABLET | ORAL | Status: DC | PRN
Start: 2013-12-12 — End: 2013-12-12

## 2013-12-12 MED ORDER — HYDROMORPHONE HCL 1 MG/ML IJ SOLN
1.0000 mg | Freq: Once | INTRAMUSCULAR | Status: AC
  Administered 2013-12-12: 1 mg via INTRAVENOUS
  Filled 2013-12-12: qty 1

## 2013-12-12 MED ORDER — OXYCODONE-ACETAMINOPHEN 5-325 MG PO TABS
1.0000 | ORAL_TABLET | Freq: Once | ORAL | Status: AC
  Administered 2013-12-12: 1 via ORAL
  Filled 2013-12-12: qty 1

## 2013-12-12 MED ORDER — FLUTICASONE PROPIONATE 50 MCG/ACT NA SUSP: 2.0000 | Freq: Every day | NASAL | Status: DC

## 2013-12-12 MED ORDER — ONDANSETRON HCL 4 MG/2ML IJ SOLN
4.0000 mg | Freq: Once | INTRAMUSCULAR | Status: AC
  Administered 2013-12-12: 4 mg via INTRAVENOUS
  Filled 2013-12-12: qty 2

## 2013-12-12 MED ORDER — LEVOFLOXACIN 500 MG PO TABS
500.0000 mg | ORAL_TABLET | Freq: Once | ORAL | Status: AC
  Administered 2013-12-12: 500 mg via ORAL
  Filled 2013-12-12: qty 1

## 2013-12-12 MED ORDER — HYDROMORPHONE HCL 4 MG PO TABS
ORAL_TABLET | ORAL | Status: DC
Start: 2013-12-12 — End: 2014-01-23

## 2013-12-12 NOTE — Progress Notes (Signed)
   Subjective:    Patient ID: Teresa [name], female    DOB: November 06, 1955, 58 y.o.   MRN: [id number]  HPI Patient here for follow up on her diverticulitis and abdominal pain.  She was seen a few weeks ago and was tx'd for diverticulitis with cipro and flagyl.  She had SE's with the abx's and was seen and abx's were changed to augmentin.  She is still hurting.  She has been having back pain that radiates down her buttocks.     Review of Systems    No chest pain, SOB, HA, dizziness, vision change, N/V, diarrhea, constipation, dysuria, urinary urgency or frequency, myalgias, arthralgias or rash.  Objective:   Physical Exam  Vital signs noted  Well developed well nourished female.  HEENT - Head atraumatic Normocephalic                Eyes - PERRLA, Conjuctiva - clear Sclera- Clear EOMI                Ears - EAC's Wnl TM's Wnl Gross Hearing WNL                Nose - Nares with decreased patency                Throat - oropharanx wnl Respiratory - Lungs CTA bilateral Cardiac - RRR S1 and S2 without murmur GI - Abdomen tender LLQ w/o guarding and bowel sounds active x 4 Extremities - No edema. Neuro - Grossly intact. MS - TTP left LS muscles     Assessment & Plan:  Diverticulitis of large intestine with perforation without bleeding - Plan: ciprofloxacin (CIPRO) 500 MG tablet, oxyCODONE-acetaminophen (PERCOCET) 10-325 MG per tablet, POCT urinalysis dipstick, POCT UA - Microscopic Only  Left lower quadrant pain - Plan: ciprofloxacin (CIPRO) 500 MG tablet, oxyCODONE-acetaminophen (PERCOCET) 10-325 MG per tablet, POCT CBC  Environmental allergies - Plan: fluticasone (FLONASE) 50 MCG/ACT nasal spray  Lysbeth Penner FNP

## 2013-12-12 NOTE — ED Provider Notes (Signed)
CSN: [id number]     Arrival date & time 12/12/13  1551 History  This chart was scribed for Teresa Diego, MD by Delphia Grates, ED Scribe. This patient was seen in room [facility]18/[facility]18 and the patient's care was started at 4:13 PM.    Chief Complaint  Patient presents with  . Headache  . Weakness    Patient is a 58 y.o. female presenting with headaches and weakness. The history is provided by the patient. No language interpreter was used.  Headache Pain location:  Generalized Radiates to:  Does not radiate Onset quality:  Sudden Timing:  Constant Progression:  Unchanged Chronicity:  New Relieved by:  None tried Worsened by:  Nothing tried Ineffective treatments:  None tried Associated symptoms: abdominal pain, back pain, dizziness and weakness   Associated symptoms: no congestion, no cough, no diarrhea, no fatigue, no seizures and no sinus pressure   Weakness Associated symptoms include abdominal pain and headaches. Pertinent negatives include no chest pain.    HPI Comments: Teresa [name] is a 58 y.o. female who presents to the Emergency Department complaining of moderate HA that began this morning. Patient was seen by her PCP today for weakness. There is associated dizziness, abdominal pain, back pain, and intermittent confusion. She denies history of HA. Patient states she has diverticulosis and that is continuing to bother her. Patient is a poor historian   Past Medical History  Diagnosis Date  . Diverticulosis 07-08-2005    colonoscopy  . Hiatal hernia 07-08-2005    EGD  . GERD (gastroesophageal reflux disease) 07-08-2005    EGD  . Irritable bowel syndrome   . Vocal cord polyp     history of  . Degeneration of intervertebral disc, site unspecified   . Osteoarthrosis, unspecified whether generalized or localized, lower leg   . Depressive disorder, not elsewhere classified   . Anxiety state, unspecified   . Full dentures   . Seizures 1990s    x 1, unknown cause; no  seizures since  . Chronic back pain greater than 3 months duration   . Seasonal allergies   . Papilloma of breast 09/2011    left  . Asthma     prn inhaler  . Esophageal stricture 2014   Past Surgical History  Procedure Laterality Date  . Cholecystectomy    . Bladder surgery      bladder tack  . 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 Cystoscopy  08/24/2011    Procedure: CYSTOSCOPY;  Surgeon: Reece Packer, MD;  Location: [facility];  Service: Urology;  Laterality: N/A;  Cystoscopy,  Rectocele Repair and Vault Prolapse Repair  . Rectocele repair  08/24/2011    Procedure: POSTERIOR REPAIR (RECTOCELE);  Surgeon: Reece Packer, MD;  Location: [facility];  Service: Urology;  Laterality: N/A;  . Vaginal prolapse repair  08/24/2011    Procedure: VAGINAL VAULT SUSPENSION;  Surgeon: Reece Packer, MD;  Location: [facility];  Service: Urology;  Laterality: N/A;  . Abdominal hysterectomy      partial  . Bilateral salpingoophorectomy    . Other surgical history      exc. vocal cord polyp  . Breast biopsy  11/03/2011    Procedure: BREAST BIOPSY WITH NEEDLE LOCALIZATION;  Surgeon: Harl Bowie, MD;  Location: [place];  Service: General;  Laterality: Left;  needle localized left breast biopsy  . Colonoscopy  07/08/2005    562.10  . Esophagogastroduodenoscopy  10/03/2012    multiple  Family History  Problem Relation Age of Onset  . Prostate cancer Father   . Stomach cancer Maternal Uncle   . Diabetes Maternal Grandmother   . Heart disease Paternal Grandmother   . Irritable bowel syndrome     History  Substance Use Topics  . Smoking status: Former Smoker    Quit date: 08/20/1991  . Smokeless tobacco: Never Used  . Alcohol Use: No   OB History   Grav Para Term Preterm Abortions TAB SAB Ect Mult Living                 Review of Systems  Constitutional: Negative for appetite change and fatigue.  HENT: Negative for congestion, ear discharge and sinus  pressure.   Eyes: Negative for discharge.  Respiratory: Negative for cough.   Cardiovascular: Negative for chest pain.  Gastrointestinal: Positive for abdominal pain. Negative for diarrhea.  Genitourinary: Negative for frequency and hematuria.  Musculoskeletal: Positive for back pain.  Skin: Negative for rash.  Neurological: Positive for dizziness, weakness and headaches. Negative for seizures.  Psychiatric/Behavioral: Negative for hallucinations.      Allergies  Arthrotec; Keflex; Naproxen; Prozac; Vicodin; Ambien; and Gabapentin  Home Medications   Prior to Admission medications   Medication Sig Start Date End Date Taking? Authorizing Provider  ALPRAZolam (XANAX) 0.25 MG tablet Take 1 tablet (0.25 mg total) by mouth at bedtime as needed for anxiety. 10/08/13   Lysbeth Penner, FNP  amoxicillin-clavulanate (AUGMENTIN) 875-125 MG per tablet Take 1 tablet by mouth 2 (two) times daily. 12/03/13   Sharion Balloon, FNP  aspirin EC 81 MG tablet Take 81 mg by mouth every other day.     Historical Provider, MD  budesonide-formoterol (SYMBICORT) 160-4.5 MCG/ACT inhaler Inhale 2 puffs into the lungs 2 (two) times daily. 03/27/13   Vernie Shanks, MD  cetirizine (ZYRTEC) 10 MG tablet TAKE ONE TABLET EVERY MORNING 11/01/13   Mary-Margaret Hassell Done, FNP  ciprofloxacin (CIPRO) 500 MG tablet Take 1 tablet (500 mg total) by mouth 2 (two) times daily. 12/12/13   Lysbeth Penner, FNP  cyclobenzaprine (FLEXERIL) 10 MG tablet Take 1 tablet (10 mg total) by mouth 3 (three) times daily as needed. spasms 05/23/13   Shanda Howells, MD  dexlansoprazole (DEXILANT) 60 MG capsule Take 1 capsule (60 mg total) by mouth daily. 07/02/13   Lysbeth Penner, FNP  ENDOCET 10-325 MG per tablet  11/29/13   Historical Provider, MD  Fiber Complete TABS Take 1 tablet by mouth daily.      Historical Provider, MD  fluticasone (FLONASE) 50 MCG/ACT nasal spray Place 2 sprays into both nostrils daily. 12/12/13   Lysbeth Penner, FNP   lubiprostone (AMITIZA) 8 MCG capsule Take 8 mcg by mouth 2 (two) times daily with a meal.    Historical Provider, MD  MAGNESIUM PO Take 1 tablet by mouth daily.     Historical Provider, MD  montelukast (SINGULAIR) 10 MG tablet TAKE ONE TABLET DAILY AT BEDTIME 07/30/13   Lysbeth Penner, FNP  oxyCODONE-acetaminophen (PERCOCET) 10-325 MG per tablet Take 1-2 tablets by mouth every 4 (four) hours as needed for pain. 12/12/13 12/22/13  Lysbeth Penner, FNP  PROAIR HFA 108 (90 BASE) MCG/ACT inhaler INHALE 2 PUFFS FOUR TIMES A DAY AS NEEDED    Lysbeth Penner, FNP  traMADol (ULTRAM) 50 MG tablet Take 1 tablet (50 mg total) by mouth every 6 (six) hours as needed. 10/08/13   Lysbeth Penner, FNP  traZODone (DESYREL) 50  MG tablet Take 50 mg by mouth 2 (two) times daily.  11/06/13   Historical Provider, MD   Triage Vitals: BP 141/95  Pulse 100  Temp(Src) 98.5 F (36.9 C) (Oral)  Resp 16  SpO2 93%  Physical Exam  Constitutional: She is oriented to person, place, and time. She appears well-developed.  Mildly lethargic.   HENT:  Head: Normocephalic.  Mouth/Throat: Mucous membranes are dry.  Eyes: Conjunctivae and EOM are normal. No scleral icterus.  Neck: Neck supple. No thyromegaly present.  Cardiovascular: Normal rate and regular rhythm.  Exam reveals no gallop and no friction rub.   No murmur heard. Pulmonary/Chest: No stridor. She has no wheezes. She has no rales. She exhibits no tenderness.  Abdominal: She exhibits no distension. There is no tenderness. There is no rebound.  Musculoskeletal: Normal range of motion. She exhibits no edema.  Lymphadenopathy:    She has no cervical adenopathy.  Neurological: She is oriented to person, place, and time. She exhibits normal muscle tone. Coordination normal.  Skin: No rash noted. No erythema.  Psychiatric: She has a normal mood and affect. Her behavior is normal.    ED Course  Procedures (including critical care time)  DIAGNOSTIC  STUDIES: Oxygen Saturation is 93% on room air, adequate by my interpretation.    COORDINATION OF CARE: At 1618 Discussed treatment plan with patient which includes pain medication, imaging, and labs. Patient agrees.   Labs Review Labs Reviewed - No data to display  Imaging Review No results found.   EKG Interpretation   Date/Time:  Wednesday December 12 2013 15:58:59 EDT Ventricular Rate:  92 PR Interval:  128 QRS Duration: 74 QT Interval:  366 QTC Calculation: 453 R Axis:   28 Text Interpretation:  Sinus rhythm Low voltage, precordial leads Confirmed  by Zalma Channing  MD, Solimar Maiden ([id number]) on 12/12/2013 6:52:54 PM      MDM   Final diagnoses:  None   Will tx infiltrate with levaquin and close follow up The chart was scribed for me under my direct supervision.  I personally performed the history, physical, and medical decision making and all procedures in the evaluation of this patient.Teresa Diego, MD 12/12/13 [phone]

## 2013-12-12 NOTE — Addendum Note (Signed)
Addended by: Lysbeth Penner on: 12/12/2013 12:22 PM   Modules accepted: Orders, Level of Service

## 2013-12-12 NOTE — Addendum Note (Signed)
Addended by: Marin Olp on: 12/12/2013 12:04 PM   Modules accepted: Orders

## 2013-12-12 NOTE — Discharge Instructions (Signed)
Follow up with your md next week for recheck °

## 2013-12-12 NOTE — Progress Notes (Addendum)
   Subjective:    Patient ID: Teresa [name], female    DOB: 1955-11-22, 58 y.o.   MRN: [id number]  HPI Patient had episode of weakness, facial numbness and tingling while in the lab.  She was placed in [facility] and taken back to room.   She then states she has a feeling in her throat that radiates to her head and she has some left upper chest discomfort that comes and goes   Review of Systems C/o numbness, tingling, and chest pain No chest pain, SOB, HA, dizziness, vision change, N/V, diarrhea, constipation, dysuria, urinary urgency or frequency, myalgias, arthralgias or rash.     Objective:   Physical Exam    Vital signs noted  Well developed well nourished female.  HEENT - Head atraumatic Normocephalic                Eyes - PERRLA, Conjuctiva - clear Sclera- Clear EOMI                Ears - EAC's Wnl TM's Wnl Gross Hearing WNL                Nose - Nares patent                 Throat - oropharanx wnl Respiratory - Lungs CTA bilateral Cardiac - RRR S1 and S2 without murmur GI - Abdomen soft Nontender and bowel sounds active x 4 Extremities - No edema. Neuro - Grossly intact.      Assessment & Plan:  Diverticulitis of large intestine with perforation without bleeding - Plan: ciprofloxacin (CIPRO) 500 MG tablet, oxyCODONE-acetaminophen (PERCOCET) 10-325 MG per tablet, POCT urinalysis dipstick, POCT UA - Microscopic Only  Left lower quadrant pain - Plan: ciprofloxacin (CIPRO) 500 MG tablet, oxyCODONE-acetaminophen (PERCOCET) 10-325 MG per tablet, POCT CBC  Environmental allergies - Plan: fluticasone (FLONASE) 50 MCG/ACT nasal spray  Other chest pain - Plan: EKG 12-Lead  Facial numbness - Get CT, carotid US, and neurology referral  Lysbeth Penner FNP

## 2013-12-12 NOTE — ED Notes (Signed)
Pt states she was seen at her PCP this morning for weakness, chest pressure, bouts of confusion, and "feeling poorly". Pt's family states the PCP is suspecting TIAs and wanted to have the pt checked for same. Pt was eating with family approximately 40 minutes prior to arrival and stated her chest pressure, and weakness were back. Pt's family states confusion comes and goes. Pt A&O at triage, no discernable weakness detected but pt states she can tell a difference.

## 2013-12-13 ENCOUNTER — Telehealth: Payer: Self-pay | Admitting: Family Medicine

## 2013-12-13 ENCOUNTER — Other Ambulatory Visit: Payer: Self-pay | Admitting: Family Medicine

## 2013-12-13 MED ORDER — ONDANSETRON 8 MG PO TBDP: 8.0000 mg | ORAL_TABLET | Freq: Three times a day (TID) | ORAL | Status: DC | PRN

## 2013-12-13 NOTE — Telephone Encounter (Signed)
Med sent to pharm 

## 2013-12-14 NOTE — Telephone Encounter (Signed)
Patient aware. She picked the medication up yesterday.

## 2013-12-19 ENCOUNTER — Ambulatory Visit (INDEPENDENT_AMBULATORY_CARE_PROVIDER_SITE_OTHER): Payer: Medicare Other

## 2013-12-19 DIAGNOSIS — Z23 Encounter for immunization: Secondary | ICD-10-CM

## 2013-12-20 ENCOUNTER — Encounter: Payer: Self-pay | Admitting: Neurology

## 2013-12-20 ENCOUNTER — Ambulatory Visit (INDEPENDENT_AMBULATORY_CARE_PROVIDER_SITE_OTHER): Payer: Medicare Other | Admitting: Neurology

## 2013-12-20 ENCOUNTER — Encounter: Payer: Self-pay | Admitting: *Deleted

## 2013-12-20 VITALS — BP 124/80 | Temp 98.0°F | Resp 14 | Ht 63.0 in | Wt 159.0 lb

## 2013-12-20 DIAGNOSIS — R51 Headache: Secondary | ICD-10-CM | POA: Diagnosis not present

## 2013-12-20 DIAGNOSIS — R519 Headache, unspecified: Secondary | ICD-10-CM

## 2013-12-20 DIAGNOSIS — R200 Anesthesia of skin: Secondary | ICD-10-CM

## 2013-12-20 DIAGNOSIS — R202 Paresthesia of skin: Secondary | ICD-10-CM | POA: Diagnosis not present
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
## 2013-12-20 NOTE — Patient Instructions (Signed)
We will do a brain MRI and call you.  Your neurological exam is fine today.  I would recommend, you follow up with your own neurologist as he knows you for years.   Have your primary care doctor check your cholesterol level.

## 2013-12-25 ENCOUNTER — Encounter (HOSPITAL_COMMUNITY): Payer: Self-pay | Admitting: Emergency Medicine

## 2013-12-25 ENCOUNTER — Emergency Department (HOSPITAL_COMMUNITY)
Admission: EM | Admit: 2013-12-25 | Discharge: 2013-12-26 | Disposition: A | Discharge status: Home / Self Care | Payer: Medicare Other | Attending: Emergency Medicine | Admitting: Emergency Medicine

## 2013-12-25 DIAGNOSIS — F411 Generalized anxiety disorder: Secondary | ICD-10-CM | POA: Insufficient documentation

## 2013-12-25 DIAGNOSIS — G40909 Epilepsy, unspecified, not intractable, without status epilepticus: Secondary | ICD-10-CM | POA: Diagnosis not present

## 2013-12-25 DIAGNOSIS — Z7951 Long term (current) use of inhaled steroids: Secondary | ICD-10-CM | POA: Insufficient documentation

## 2013-12-25 DIAGNOSIS — G8929 Other chronic pain: Secondary | ICD-10-CM | POA: Diagnosis not present

## 2013-12-25 DIAGNOSIS — F329 Major depressive disorder, single episode, unspecified: Secondary | ICD-10-CM | POA: Insufficient documentation

## 2013-12-25 DIAGNOSIS — Z87891 Personal history of nicotine dependence: Secondary | ICD-10-CM | POA: Insufficient documentation

## 2013-12-25 DIAGNOSIS — R200 Anesthesia of skin: Secondary | ICD-10-CM | POA: Insufficient documentation

## 2013-12-25 DIAGNOSIS — J45909 Unspecified asthma, uncomplicated: Secondary | ICD-10-CM | POA: Insufficient documentation

## 2013-12-25 DIAGNOSIS — G43009 Migraine without aura, not intractable, without status migrainosus: Secondary | ICD-10-CM | POA: Diagnosis not present

## 2013-12-25 DIAGNOSIS — Z7982 Long term (current) use of aspirin: Secondary | ICD-10-CM | POA: Insufficient documentation

## 2013-12-25 DIAGNOSIS — Z8739 Personal history of other diseases of the musculoskeletal system and connective tissue: Secondary | ICD-10-CM | POA: Insufficient documentation

## 2013-12-25 DIAGNOSIS — Z792 Long term (current) use of antibiotics: Secondary | ICD-10-CM | POA: Diagnosis not present

## 2013-12-25 DIAGNOSIS — Z8742 Personal history of other diseases of the female genital tract: Secondary | ICD-10-CM | POA: Insufficient documentation

## 2013-12-25 DIAGNOSIS — Z79899 Other long term (current) drug therapy: Secondary | ICD-10-CM | POA: Diagnosis not present

## 2013-12-25 DIAGNOSIS — K219 Gastro-esophageal reflux disease without esophagitis: Secondary | ICD-10-CM | POA: Insufficient documentation

## 2013-12-25 DIAGNOSIS — R51 Headache: Secondary | ICD-10-CM | POA: Diagnosis present

## 2013-12-25 NOTE — ED Notes (Signed)
Having headache, tingling in my face, having nausea, and my ears feel like they are on fire.

## 2013-12-25 NOTE — ED Notes (Addendum)
Pt. Reports numbness and tingling in face since Oct. 14th. Pt. Reports being seen here for the same. Pt. Reports trying to get appointment with Dr. Guy Franco but was unable to. Pt. Reports headache with light sensitivity. Pt. Reports nausea earlier.

## 2013-12-25 NOTE — ED Notes (Signed)
Pt updated on plan of care, delay explained to pt.

## 2013-12-26 DIAGNOSIS — G43009 Migraine without aura, not intractable, without status migrainosus: Secondary | ICD-10-CM | POA: Diagnosis not present

## 2013-12-26 LAB — COMPREHENSIVE METABOLIC PANEL
ALT: 26 U/L (ref 0–35)
AST: 23 U/L (ref 0–37)
Albumin: 3.6 g/dL (ref 3.5–5.2)
Alkaline Phosphatase: 73 U/L (ref 39–117)
Anion gap: 11 (ref 5–15)
BUN: 10 mg/dL (ref 6–23)
CALCIUM: 9.3 mg/dL (ref 8.4–10.5)
CO2: 27 meq/L (ref 19–32)
CREATININE: 0.73 mg/dL (ref 0.50–1.10)
Chloride: 103 mEq/L (ref 96–112)
GFR calc Af Amer: >90 mL/min (ref >90)
GLUCOSE: 117 mg/dL — AB (ref 70–99)
Potassium: 3.7 mEq/L (ref 3.7–5.3)
Sodium: 141 mEq/L (ref 137–147)
Total Bilirubin: 0.7 mg/dL (ref 0.3–1.2)
Total Protein: 7.1 g/dL (ref 6.0–8.3)

## 2013-12-26 LAB — CBC WITH DIFFERENTIAL/PLATELET
BASOS ABS: 0 10*3/uL (ref 0.0–0.1)
Basophils Relative: 0 % (ref 0–1)
EOS PCT: 2 % (ref 0–5)
Eosinophils Absolute: 0.1 10*3/uL (ref 0.0–0.7)
HCT: 37.8 % (ref 36.0–46.0)
Hemoglobin: 12.7 g/dL (ref 12.0–15.0)
LYMPHS ABS: 2.7 10*3/uL (ref 0.7–4.0)
Lymphocytes Relative: 31 % (ref 12–46)
MCH: 27.2 pg (ref 26.0–34.0)
MCHC: 33.6 g/dL (ref 30.0–36.0)
MCV: 80.9 fL (ref 78.0–100.0)
MONO ABS: 0.6 10*3/uL (ref 0.1–1.0)
Monocytes Relative: 7 % (ref 3–12)
Neutro Abs: 5.3 10*3/uL (ref 1.7–7.7)
Neutrophils Relative %: 60 % (ref 43–77)
Platelets: 191 10*3/uL (ref 150–400)
RBC: 4.67 MIL/uL (ref 3.87–5.11)
RDW: 14.3 % (ref 11.5–15.5)
WBC: 8.9 10*3/uL (ref 4.0–10.5)

## 2013-12-26 LAB — URINALYSIS, ROUTINE W REFLEX MICROSCOPIC
Bilirubin Urine: NEGATIVE
Glucose, UA: NEGATIVE mg/dL
Hgb urine dipstick: NEGATIVE
Ketones, ur: NEGATIVE mg/dL
LEUKOCYTES UA: NEGATIVE
NITRITE: NEGATIVE
PROTEIN: NEGATIVE mg/dL
Specific Gravity, Urine: >1.03 — ABNORMAL HIGH (ref 1.005–1.030)
UROBILINOGEN UA: 0.2 mg/dL (ref 0.0–1.0)
pH: 6.5 (ref 5.0–8.0)

## 2013-12-26 MED ORDER — OXYCODONE-ACETAMINOPHEN 5-325 MG PO TABS: 2.0000 | ORAL_TABLET | ORAL | Status: DC | PRN

## 2013-12-26 MED ORDER — OXYCODONE-ACETAMINOPHEN 5-325 MG PO TABS
2.0000 | ORAL_TABLET | Freq: Once | ORAL | Status: AC
  Administered 2013-12-26: 2 via ORAL
  Filled 2013-12-26: qty 2

## 2013-12-26 NOTE — ED Provider Notes (Signed)
Medical screening examination/treatment/procedure(s) were performed by non-physician practitioner and as supervising physician I was immediately available for consultation/collaboration.   EKG Interpretation None        Merryl Hacker, MD 12/26/13 [phone]

## 2013-12-26 NOTE — Discharge Instructions (Signed)

## 2013-12-26 NOTE — ED Provider Notes (Signed)
CSN: [id number]     Arrival date & time 12/25/13  2109 History   First MD Initiated Contact with Patient 12/25/13 2348     Chief Complaint  Patient presents with  . Numbness     (Consider location/radiation/quality/duration/timing/severity/associated sxs/prior Treatment) Patient is a 58 y.o. female presenting with headaches. The history is provided by the patient. No language interpreter was used.  Headache Pain location:  Generalized Quality:  Sharp Radiates to:  Does not radiate Severity currently:  6/10 Severity at highest:  6/10 Onset quality:  Gradual Timing:  Constant Progression:  Unchanged Chronicity:  Recurrent Similar to prior headaches: yes   Relieved by:  Nothing Worsened by:  Nothing tried Ineffective treatments:  None tried Associated symptoms: numbness and tingling   Pt reports she has headaches and tingling in her face.  Pt has had headaches on and off for years.  Pt reports tingling in her face for weeks.  Pt reports being seen here and had a ct.  Pt seen at [facility].  She reports she is unable to see Dr. Laurance Flatten for several weeks.  Pt reports she is suppose to have a cholesterol study and is concerned that elevated cholesterol may be contributing to her symptoms.   Past Medical History  Diagnosis Date  . Diverticulosis 07-08-2005    colonoscopy  . Hiatal hernia 07-08-2005    EGD  . GERD (gastroesophageal reflux disease) 07-08-2005    EGD  . Irritable bowel syndrome   . Vocal cord polyp     history of  . Degeneration of intervertebral disc, site unspecified   . Osteoarthrosis, unspecified whether generalized or localized, lower leg   . Depressive disorder, not elsewhere classified   . Anxiety state, unspecified   . Full dentures   . Seizures 1990s    x 1, unknown cause; no seizures since  . Chronic back pain greater than 3 months duration   . Seasonal allergies   . Papilloma of breast 09/2011    left  . Asthma     prn inhaler  . Esophageal  stricture 2014  . Insomnia   . Muscle spasm   . Migraine    Past Surgical History  Procedure Laterality Date  . Cholecystectomy    . Bladder surgery      bladder tack  . 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 Cystoscopy  08/24/2011    Procedure: CYSTOSCOPY;  Surgeon: Reece Packer, MD;  Location: [facility];  Service: Urology;  Laterality: N/A;  Cystoscopy,  Rectocele Repair and Vault Prolapse Repair  . Rectocele repair  08/24/2011    Procedure: POSTERIOR REPAIR (RECTOCELE);  Surgeon: Reece Packer, MD;  Location: [facility];  Service: Urology;  Laterality: N/A;  . Vaginal prolapse repair  08/24/2011    Procedure: VAGINAL VAULT SUSPENSION;  Surgeon: Reece Packer, MD;  Location: [facility];  Service: Urology;  Laterality: N/A;  . Abdominal hysterectomy      partial  . Bilateral salpingoophorectomy    . Other surgical history      exc. vocal cord polyp  . Breast biopsy  11/03/2011    Procedure: BREAST BIOPSY WITH NEEDLE LOCALIZATION;  Surgeon: Harl Bowie, MD;  Location: [place];  Service: General;  Laterality: Left;  needle localized left breast biopsy  . Colonoscopy  07/08/2005    562.10  . Esophagogastroduodenoscopy  10/03/2012    multiple   . Tumor removal      benign  .  Rectocelle repair     Family History  Problem Relation Age of Onset  . Prostate cancer Father   . Sleep apnea Father   . Stomach cancer Maternal Uncle   . Diabetes Maternal Grandmother   . Stroke Maternal Grandmother   . Heart disease Paternal Grandmother   . Irritable bowel syndrome    . Stroke Maternal Grandfather    History  Substance Use Topics  . Smoking status: Former Smoker    Quit date: 08/20/1991  . Smokeless tobacco: Never Used  . Alcohol Use: No   OB History   Grav Para Term Preterm Abortions TAB SAB Ect Mult Living                 Review of Systems  Neurological: Positive for numbness and headaches.  All other systems reviewed and are  negative.     Allergies  Trazodone and nefazodone; Arthrotec; Flagyl; Keflex; Naproxen; Prozac; Vicodin; Ambien; and Gabapentin  Home Medications   Prior to Admission medications   Medication Sig Start Date End Date Taking? Authorizing Provider  albuterol (PROAIR HFA) 108 (90 BASE) MCG/ACT inhaler Inhale 2 puffs into the lungs 4 (four) times daily as needed for wheezing or shortness of breath.    Historical Provider, MD  ALPRAZolam Duanne Moron) 0.25 MG tablet Take 1 tablet (0.25 mg total) by mouth at bedtime as needed for anxiety. 10/08/13   Lysbeth Penner, FNP  amoxicillin-clavulanate (AUGMENTIN) 875-125 MG per tablet 1 tablet 2 (two) times daily.  12/03/13   Historical Provider, MD  aspirin EC 81 MG tablet Take 81 mg by mouth every other day.     Historical Provider, MD  budesonide-formoterol (SYMBICORT) 160-4.5 MCG/ACT inhaler Inhale 2 puffs into the lungs 2 (two) times daily. 03/27/13   Vernie Shanks, MD  cetirizine (ZYRTEC) 10 MG tablet Take 10 mg by mouth daily.    Historical Provider, MD  cyclobenzaprine (FLEXERIL) 10 MG tablet Take 1 tablet (10 mg total) by mouth 3 (three) times daily as needed. spasms 05/23/13   Shanda Howells, MD  dexlansoprazole (DEXILANT) 60 MG capsule Take 1 capsule (60 mg total) by mouth daily. 07/02/13   Lysbeth Penner, FNP  Fiber Complete TABS Take 1 tablet by mouth daily.      Historical Provider, MD  fluticasone (FLONASE) 50 MCG/ACT nasal spray Place 2 sprays into both nostrils daily. 12/12/13   Lysbeth Penner, FNP  HYDROmorphone (DILAUDID) 4 MG tablet Take one every 6 hours as needed for a severe headache 12/12/13   Maudry Diego, MD  levofloxacin (LEVAQUIN) 500 MG tablet Take 1 tablet (500 mg total) by mouth daily. 12/12/13   Maudry Diego, MD  lubiprostone (AMITIZA) 8 MCG capsule Take 8 mcg by mouth 2 (two) times daily with a meal.    Historical Provider, MD  MAGNESIUM PO Take 1 tablet by mouth daily.     Historical Provider, MD  montelukast (SINGULAIR)  10 MG tablet Take 10 mg by mouth daily.    Historical Provider, MD  ondansetron (ZOFRAN ODT) 4 MG disintegrating tablet 4mg  ODT q4 hours prn nausea/vomit 12/12/13   Maudry Diego, MD  ondansetron (ZOFRAN ODT) 8 MG disintegrating tablet Take 1 tablet (8 mg total) by mouth every 8 (eight) hours as needed for nausea or vomiting. 12/13/13   Lysbeth Penner, FNP  oxyCODONE-acetaminophen (PERCOCET) 10-325 MG per tablet Take 1-2 tablets by mouth every 4 (four) hours as needed for pain.    Historical Provider, MD  traMADol (ULTRAM) 50 MG tablet Take 1 tablet (50 mg total) by mouth every 6 (six) hours as needed. 10/08/13   Lysbeth Penner, FNP   BP 126/87  Pulse 97  Temp(Src) 97.6 F (36.4 C) (Oral)  Resp 12  Ht 5\' 3"  (1.6 m)  Wt 159 lb (72.122 kg)  BMI 28.17 kg/m2  SpO2 95% Physical Exam  Nursing note and vitals reviewed. Constitutional: She is oriented to person, place, and time. She appears well-developed and well-nourished.  HENT:  Head: Normocephalic and atraumatic.  Eyes: EOM are normal. Pupils are equal, round, and reactive to light.  Neck: Normal range of motion.  Cardiovascular: Normal rate.   Pulmonary/Chest: Effort normal.  Abdominal: Soft. She exhibits no distension.  Musculoskeletal: Normal range of motion.  Neurological: She is alert and oriented to person, place, and time.  Skin: Skin is warm.  Psychiatric: She has a normal mood and affect.    ED Course  Procedures (including critical care time) Labs Review Labs Reviewed  COMPREHENSIVE METABOLIC PANEL - Abnormal; Notable for the following:    Glucose, Bld 117 (*)    All other components within normal limits  URINALYSIS, ROUTINE W REFLEX MICROSCOPIC - Abnormal; Notable for the following:    Specific Gravity, Urine >1.030 (*)    All other components within normal limits  CBC WITH DIFFERENTIAL    Imaging Review No results found.   EKG Interpretation None      MDM  Pt seen by neurology.  She is scheduled for  MRi for evaluation.   I will treat pt for headache.  She is advised to follow up with Dr. Laurance Flatten as scheduled   Final diagnoses:  Migraine without aura and without status migrainosus, not intractable    Percocet 10 tablets    Fransico Meadow, PA-C 12/26/13 0124

## 2013-12-27 ENCOUNTER — Telehealth: Payer: Self-pay | Admitting: Neurology

## 2013-12-27 DIAGNOSIS — R200 Anesthesia of skin: Secondary | ICD-10-CM | POA: Diagnosis not present

## 2013-12-27 DIAGNOSIS — H538 Other visual disturbances: Secondary | ICD-10-CM | POA: Diagnosis not present

## 2013-12-27 DIAGNOSIS — R110 Nausea: Secondary | ICD-10-CM | POA: Diagnosis not present

## 2013-12-27 DIAGNOSIS — R42 Dizziness and giddiness: Secondary | ICD-10-CM | POA: Diagnosis not present

## 2013-12-27 DIAGNOSIS — R51 Headache: Secondary | ICD-10-CM | POA: Diagnosis not present

## 2013-12-27 NOTE — Telephone Encounter (Signed)
Patient calling for MRI results.  Please call and advise. °

## 2013-12-27 NOTE — Telephone Encounter (Signed)
Called and left VM that results are not ready as of yet

## 2013-12-28 NOTE — Telephone Encounter (Signed)
Called and could not leave VM concerning MRI results not being ready as of yet

## 2013-12-31 ENCOUNTER — Telehealth: Payer: Self-pay | Admitting: Neurology

## 2013-12-31 NOTE — Telephone Encounter (Signed)
Patient is calling to get MRI results. °

## 2013-12-31 NOTE — Telephone Encounter (Signed)
Patient calling again for Results.  Stated she picked up results from [place] and not sure why no one has called to discuss results with her.  Please call and advise.

## 2014-01-01 DIAGNOSIS — R109 Unspecified abdominal pain: Secondary | ICD-10-CM | POA: Diagnosis not present

## 2014-01-01 NOTE — Telephone Encounter (Signed)
Patient calling to state that she is still having bad headaches and wants to know if the MRI results showed anything, please return call and advise. She can be reached at [phone].

## 2014-01-01 NOTE — Telephone Encounter (Signed)
Called patient ,line busy , will try to call back later

## 2014-01-02 ENCOUNTER — Other Ambulatory Visit: Payer: Self-pay | Admitting: Family Medicine

## 2014-01-02 ENCOUNTER — Telehealth: Payer: Self-pay | Admitting: Family Medicine

## 2014-01-02 NOTE — Telephone Encounter (Signed)
Please review

## 2014-01-02 NOTE — Telephone Encounter (Signed)
Please call the office who rx'd the flagyl and have them address your nausea

## 2014-01-02 NOTE — Telephone Encounter (Signed)
Pt notified and verbalized understanding.

## 2014-01-03 NOTE — Telephone Encounter (Signed)
Called and left VM message for return call back and that results are not ready as of yet

## 2014-01-03 NOTE — Telephone Encounter (Signed)
Patient calling to request MRI results, please call and advise. She can be reached at [phone].

## 2014-01-07 NOTE — Telephone Encounter (Signed)
Spoke with patient and she had the MRI done at [facility], she has the results but does not understand them. Called [facility] and they will fax results for Dr Rexene Alberts to review and call patient with results, informed the patient .

## 2014-01-07 NOTE — Telephone Encounter (Signed)
Please call and advise the patient that the recent scan we did was within normal limits. We did a brain MRI wo contrast and the report indicated stable and negative exam for age. In fact compared to an MRI from 12/27/2008 findings were stable which is reassuring. No further action is required on this test at this time. Please remind patient to keep any upcoming appointments or tests and to call us with any interim questions, concerns, problems or updates. Thanks,  Star Age, MD, PhD

## 2014-01-07 NOTE — Telephone Encounter (Signed)
Patient returning call to [facility], please return call and advise at [phone].

## 2014-01-07 NOTE — Telephone Encounter (Signed)
Called patient and left Vm to call back concerning MRI done in [place]

## 2014-01-07 NOTE — Telephone Encounter (Signed)
Shared results with patient , she verbalized understanding

## 2014-01-22 ENCOUNTER — Emergency Department (HOSPITAL_COMMUNITY)
Admission: EM | Admit: 2014-01-22 | Discharge: 2014-01-23 | Disposition: A | Discharge status: Home / Self Care | Payer: Medicare Other | Attending: Emergency Medicine | Admitting: Emergency Medicine

## 2014-01-22 ENCOUNTER — Encounter (HOSPITAL_COMMUNITY): Payer: Self-pay | Admitting: Emergency Medicine

## 2014-01-22 DIAGNOSIS — Z792 Long term (current) use of antibiotics: Secondary | ICD-10-CM | POA: Insufficient documentation

## 2014-01-22 DIAGNOSIS — G8929 Other chronic pain: Secondary | ICD-10-CM | POA: Diagnosis not present

## 2014-01-22 DIAGNOSIS — Z8679 Personal history of other diseases of the circulatory system: Secondary | ICD-10-CM | POA: Diagnosis not present

## 2014-01-22 DIAGNOSIS — Z9089 Acquired absence of other organs: Secondary | ICD-10-CM | POA: Insufficient documentation

## 2014-01-22 DIAGNOSIS — Z8669 Personal history of other diseases of the nervous system and sense organs: Secondary | ICD-10-CM | POA: Diagnosis not present

## 2014-01-22 DIAGNOSIS — F329 Major depressive disorder, single episode, unspecified: Secondary | ICD-10-CM | POA: Insufficient documentation

## 2014-01-22 DIAGNOSIS — R1013 Epigastric pain: Secondary | ICD-10-CM | POA: Diagnosis not present

## 2014-01-22 DIAGNOSIS — R110 Nausea: Secondary | ICD-10-CM | POA: Diagnosis not present

## 2014-01-22 DIAGNOSIS — Z79899 Other long term (current) drug therapy: Secondary | ICD-10-CM | POA: Insufficient documentation

## 2014-01-22 DIAGNOSIS — J45909 Unspecified asthma, uncomplicated: Secondary | ICD-10-CM | POA: Insufficient documentation

## 2014-01-22 DIAGNOSIS — R109 Unspecified abdominal pain: Secondary | ICD-10-CM | POA: Diagnosis present

## 2014-01-22 DIAGNOSIS — F419 Anxiety disorder, unspecified: Secondary | ICD-10-CM | POA: Diagnosis not present

## 2014-01-22 DIAGNOSIS — Z7982 Long term (current) use of aspirin: Secondary | ICD-10-CM | POA: Insufficient documentation

## 2014-01-22 DIAGNOSIS — K219 Gastro-esophageal reflux disease without esophagitis: Secondary | ICD-10-CM | POA: Diagnosis not present

## 2014-01-22 DIAGNOSIS — M1990 Unspecified osteoarthritis, unspecified site: Secondary | ICD-10-CM | POA: Insufficient documentation

## 2014-01-22 DIAGNOSIS — Z87891 Personal history of nicotine dependence: Secondary | ICD-10-CM | POA: Diagnosis not present

## 2014-01-22 DIAGNOSIS — Z9889 Other specified postprocedural states: Secondary | ICD-10-CM | POA: Diagnosis not present

## 2014-01-22 DIAGNOSIS — Z7952 Long term (current) use of systemic steroids: Secondary | ICD-10-CM | POA: Diagnosis not present

## 2014-01-22 LAB — COMPREHENSIVE METABOLIC PANEL
ALK PHOS: 73 U/L (ref 39–117)
ALT: 31 U/L (ref 0–35)
AST: 27 U/L (ref 0–37)
Albumin: 3.8 g/dL (ref 3.5–5.2)
Anion gap: 13 (ref 5–15)
BUN: 9 mg/dL (ref 6–23)
CO2: 27 meq/L (ref 19–32)
Calcium: 9.3 mg/dL (ref 8.4–10.5)
Chloride: 100 mEq/L (ref 96–112)
Creatinine, Ser: 0.7 mg/dL (ref 0.50–1.10)
GFR calc Af Amer: >90 mL/min (ref >90)
GLUCOSE: 110 mg/dL — AB (ref 70–99)
POTASSIUM: 4 meq/L (ref 3.7–5.3)
SODIUM: 140 meq/L (ref 137–147)
TOTAL PROTEIN: 7.2 g/dL (ref 6.0–8.3)
Total Bilirubin: 0.6 mg/dL (ref 0.3–1.2)

## 2014-01-22 LAB — CBC WITH DIFFERENTIAL/PLATELET
BASOS ABS: 0 10*3/uL (ref 0.0–0.1)
Basophils Relative: 0 % (ref 0–1)
Eosinophils Absolute: 0.2 10*3/uL (ref 0.0–0.7)
Eosinophils Relative: 2 % (ref 0–5)
HCT: 38.7 % (ref 36.0–46.0)
Hemoglobin: 12.9 g/dL (ref 12.0–15.0)
LYMPHS ABS: 2.7 10*3/uL (ref 0.7–4.0)
LYMPHS PCT: 36 % (ref 12–46)
MCH: 27.1 pg (ref 26.0–34.0)
MCHC: 33.3 g/dL (ref 30.0–36.0)
MCV: 81.3 fL (ref 78.0–100.0)
Monocytes Absolute: 0.5 10*3/uL (ref 0.1–1.0)
Monocytes Relative: 7 % (ref 3–12)
Neutro Abs: 4.2 10*3/uL (ref 1.7–7.7)
Neutrophils Relative %: 55 % (ref 43–77)
PLATELETS: 215 10*3/uL (ref 150–400)
RBC: 4.76 MIL/uL (ref 3.87–5.11)
RDW: 14 % (ref 11.5–15.5)
WBC: 7.6 10*3/uL (ref 4.0–10.5)

## 2014-01-22 LAB — URINALYSIS, ROUTINE W REFLEX MICROSCOPIC
BILIRUBIN URINE: NEGATIVE
Glucose, UA: NEGATIVE mg/dL
HGB URINE DIPSTICK: NEGATIVE
Ketones, ur: NEGATIVE mg/dL
Leukocytes, UA: NEGATIVE
Nitrite: NEGATIVE
Protein, ur: NEGATIVE mg/dL
SPECIFIC GRAVITY, URINE: 1.01 (ref 1.005–1.030)
Urobilinogen, UA: 0.2 mg/dL (ref 0.0–1.0)
pH: 6 (ref 5.0–8.0)

## 2014-01-22 LAB — LACTIC ACID, PLASMA: LACTIC ACID, VENOUS: 0.8 mmol/L (ref 0.5–2.2)

## 2014-01-22 LAB — LIPASE, BLOOD: LIPASE: 55 U/L (ref 11–59)

## 2014-01-22 MED ORDER — ONDANSETRON HCL 4 MG/2ML IJ SOLN
4.0000 mg | Freq: Once | INTRAMUSCULAR | Status: AC
  Administered 2014-01-22: 4 mg via INTRAMUSCULAR
  Filled 2014-01-22: qty 2

## 2014-01-22 MED ORDER — MORPHINE SULFATE 4 MG/ML IJ SOLN
4.0000 mg | Freq: Once | INTRAMUSCULAR | Status: AC
  Administered 2014-01-22: 4 mg via INTRAVENOUS
  Filled 2014-01-22: qty 1

## 2014-01-22 NOTE — ED Notes (Signed)
Pt c/o abd pain x one week. Pt recently diagnosed with diverticulitis and has finished all her antibiotics.

## 2014-01-23 DIAGNOSIS — J019 Acute sinusitis, unspecified: Secondary | ICD-10-CM | POA: Diagnosis not present

## 2014-01-23 MED ORDER — OXYCODONE-ACETAMINOPHEN 5-325 MG PO TABS: 1.0000 | ORAL_TABLET | Freq: Four times a day (QID) | ORAL | Status: DC | PRN

## 2014-01-23 MED ORDER — ONDANSETRON 4 MG PO TBDP: 4.0000 mg | ORAL_TABLET | Freq: Three times a day (TID) | ORAL | Status: DC | PRN

## 2014-01-23 NOTE — ED Provider Notes (Signed)
CSN: [id number]     Arrival date & time 01/22/14  2019 History   First MD Initiated Contact with Patient 01/22/14 2157     Chief Complaint  Patient presents with  . Abdominal Pain     (Consider location/radiation/quality/duration/timing/severity/associated sxs/prior Treatment) HPI  This is a 58 yo with history of diverticulosis, hiatal hernia, GERD and IBS who presents with abdominal pain.  Patient reports recent history of multiple courses of diverticulitis.  States that she has had ongoing pain.  Pain is 10/10, crampy and located in the epigastrium and LLQ.  Denies fever, diarrhea or vomiting.  Endorses nausea.  Was taking antibiotics and percocet at home which seemed to help but has run out of her medication.  Is followed by a primary doctor as well as a GI specialist.  Denies any dysuria or hematuria.  Past Medical History  Diagnosis Date  . Diverticulosis 07-08-2005    colonoscopy  . Hiatal hernia 07-08-2005    EGD  . GERD (gastroesophageal reflux disease) 07-08-2005    EGD  . Irritable bowel syndrome   . Vocal cord polyp     history of  . Degeneration of intervertebral disc, site unspecified   . Osteoarthrosis, unspecified whether generalized or localized, lower leg   . Depressive disorder, not elsewhere classified   . Anxiety state, unspecified   . Full dentures   . Seizures 1990s    x 1, unknown cause; no seizures since  . Chronic back pain greater than 3 months duration   . Seasonal allergies   . Papilloma of breast 09/2011    left  . Asthma     prn inhaler  . Esophageal stricture 2014  . Insomnia   . Muscle spasm   . Migraine    Past Surgical History  Procedure Laterality Date  . Cholecystectomy    . Bladder surgery      bladder tack  . 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 Cystoscopy  08/24/2011    Procedure: CYSTOSCOPY;  Surgeon: Reece Packer, MD;  Location: [facility];  Service: Urology;  Laterality: N/A;  Cystoscopy,  Rectocele Repair and Vault  Prolapse Repair  . Rectocele repair  08/24/2011    Procedure: POSTERIOR REPAIR (RECTOCELE);  Surgeon: Reece Packer, MD;  Location: [facility];  Service: Urology;  Laterality: N/A;  . Vaginal prolapse repair  08/24/2011    Procedure: VAGINAL VAULT SUSPENSION;  Surgeon: Reece Packer, MD;  Location: [facility];  Service: Urology;  Laterality: N/A;  . Abdominal hysterectomy      partial  . Bilateral salpingoophorectomy    . Other surgical history      exc. vocal cord polyp  . Breast biopsy  11/03/2011    Procedure: BREAST BIOPSY WITH NEEDLE LOCALIZATION;  Surgeon: Harl Bowie, MD;  Location: [place];  Service: General;  Laterality: Left;  needle localized left breast biopsy  . Colonoscopy  07/08/2005    562.10  . Esophagogastroduodenoscopy  10/03/2012    multiple   . Tumor removal      benign  . Rectocelle repair     Family History  Problem Relation Age of Onset  . Prostate cancer Father   . Sleep apnea Father   . Stomach cancer Maternal Uncle   . Diabetes Maternal Grandmother   . Stroke Maternal Grandmother   . Heart disease Paternal Grandmother   . Irritable bowel syndrome    . Stroke Maternal Grandfather    History  Substance Use Topics  . Smoking status: Former Smoker    Quit date: 08/20/1991  . Smokeless tobacco: Never Used  . Alcohol Use: No   OB History    No data available     Review of Systems  Constitutional: Negative for fever.  Respiratory: Negative for chest tightness and shortness of breath.   Cardiovascular: Negative for chest pain.  Gastrointestinal: Positive for nausea and abdominal pain. Negative for vomiting, diarrhea, constipation and blood in stool.  Genitourinary: Negative for dysuria and flank pain.  Musculoskeletal: Negative for back pain.  Neurological: Negative for headaches.  Psychiatric/Behavioral: Negative for confusion.  All other systems reviewed and are negative.     Allergies  Trazodone and nefazodone;  Arthrotec; Keflex; Naproxen; Prozac; Vicodin; Ambien; and Gabapentin  Home Medications   Prior to Admission medications   Medication Sig Start Date End Date Taking? Authorizing Provider  albuterol (PROAIR HFA) 108 (90 BASE) MCG/ACT inhaler Inhale 2 puffs into the lungs 4 (four) times daily as needed for wheezing or shortness of breath.   Yes Historical Provider, MD  ALPRAZolam (XANAX) 0.25 MG tablet Take 1 tablet (0.25 mg total) by mouth at bedtime as needed for anxiety. Patient taking differently: Take 0.25 mg by mouth 2 (two) times daily as needed for anxiety.  10/08/13  Yes Lysbeth Penner, FNP  aspirin EC 81 MG tablet Take 81 mg by mouth every other day.    Yes Historical Provider, MD  budesonide-formoterol (SYMBICORT) 160-4.5 MCG/ACT inhaler Inhale 2 puffs into the lungs 2 (two) times daily. 03/27/13  Yes Vernie Shanks, MD  cetirizine (ZYRTEC) 10 MG tablet Take 10 mg by mouth daily.   Yes Historical Provider, MD  DEXILANT 60 MG capsule TAKE ONE (1) CAPSULE EACH DAY 01/04/14  Yes Lysbeth Penner, FNP  docusate sodium (COLACE) 100 MG capsule Take 100 mg by mouth daily as needed for mild constipation.   Yes Historical Provider, MD  Fiber Complete TABS Take 1 tablet by mouth daily.     Yes Historical Provider, MD  fluticasone (FLONASE) 50 MCG/ACT nasal spray Place 2 sprays into both nostrils daily. 12/12/13  Yes Lysbeth Penner, FNP  lubiprostone (AMITIZA) 8 MCG capsule Take 8 mcg by mouth 2 (two) times daily with a meal.   Yes Historical Provider, MD  MAGNESIUM PO Take 1 tablet by mouth daily.    Yes Historical Provider, MD  methocarbamol (ROBAXIN) 500 MG tablet Take 500 mg by mouth daily as needed for muscle spasms.   Yes Historical Provider, MD  montelukast (SINGULAIR) 10 MG tablet Take 10 mg by mouth daily.   Yes Historical Provider, MD  traMADol (ULTRAM) 50 MG tablet Take 1 tablet (50 mg total) by mouth every 6 (six) hours as needed. 10/08/13  Yes Lysbeth Penner, FNP  levofloxacin  (LEVAQUIN) 500 MG tablet Take 1 tablet (500 mg total) by mouth daily. Patient not taking: Reported on 01/22/2014 12/12/13   Maudry Diego, MD  ondansetron (ZOFRAN ODT) 4 MG disintegrating tablet Take 1 tablet (4 mg total) by mouth every 8 (eight) hours as needed for nausea or vomiting. 01/23/14   Merryl Hacker, MD  oxyCODONE-acetaminophen (PERCOCET/ROXICET) 5-325 MG per tablet Take 1 tablet by mouth every 6 (six) hours as needed for moderate pain or severe pain. 01/23/14   Merryl Hacker, MD   BP 112/58 mmHg  Pulse 92  Temp(Src) 98.1 F (36.7 C) (Oral)  Resp 16  Ht 5\' 3"  (1.6 m)  Wt 138  lb (62.596 kg)  BMI 24.45 kg/m2  SpO2 100% Physical Exam  Constitutional: She is oriented to person, place, and time. No distress.  HENT:  Head: Normocephalic and atraumatic.  Eyes: Pupils are equal, round, and reactive to light.  Neck: Neck supple.  Cardiovascular: Normal rate, regular rhythm and normal heart sounds.   Pulmonary/Chest: Effort normal and breath sounds normal. No respiratory distress. She has no wheezes.  Abdominal: Soft. There is no tenderness. There is no rebound and no guarding.  Mildly hyperactive BS  Musculoskeletal: She exhibits no edema.  Neurological: She is alert and oriented to person, place, and time.  Skin: Skin is warm and dry.  Psychiatric: She has a normal mood and affect.  Nursing note and vitals reviewed.   ED Course  Procedures (including critical care time) Labs Review Labs Reviewed  COMPREHENSIVE METABOLIC PANEL - Abnormal; Notable for the following:    Glucose, Bld 110 (*)    All other components within normal limits  CBC WITH DIFFERENTIAL  URINALYSIS, ROUTINE W REFLEX MICROSCOPIC  LIPASE, BLOOD  LACTIC ACID, PLASMA    Imaging Review No results found.   EKG Interpretation None      MDM   Final diagnoses:  Epigastric pain    Patient presents with acute on chronic abdominal pain.  Nontoxic on exam.  Afebrile.  Abdominal exam is  benign.  W/U including CBC, CMP, Urinalysis, lipase and lactate all reassuring.  Discussed with patient that at this time I do not thing she needs further imaging and I would defer further antibiotics given recent multiple courses of antibiotics.  She needs to follow-up with her primary and GI doctors closely as symptoms may be related to IBS.  Patient stated understanding.  After history, exam, and medical workup I feel the patient has been appropriately medically screened and is safe for discharge home. Pertinent diagnoses were discussed with the patient. Patient was given return precautions.     Merryl Hacker, MD 01/23/14 2105

## 2014-01-23 NOTE — Discharge Instructions (Signed)
YOu were seen today for abdominal pain. All of your lab work and urine is reassuring. Your exam is also reassuring. Given that you have been on multiple recent courses of antibiotics, will defer antibiotics at this time given reassuring workup. You need to follow-up with her GI doctor and your primary doctor.    Abdominal Pain, Women Abdominal (stomach, pelvic, or belly) pain can be caused by many things. It is important to tell your doctor:  The location of the pain.  Does it come and go or is it present all the time?  Are there things that start the pain (eating certain foods, exercise)?  Are there other symptoms associated with the pain (fever, nausea, vomiting, diarrhea)? All of this is helpful to know when trying to find the cause of the pain. CAUSES   Stomach: virus or bacteria infection, or ulcer.  Intestine: appendicitis (inflamed appendix), regional ileitis (Crohn's disease), ulcerative colitis (inflamed colon), irritable bowel syndrome, diverticulitis (inflamed diverticulum of the colon), or cancer of the stomach or intestine.  Gallbladder disease or stones in the gallbladder.  Kidney disease, kidney stones, or infection.  Pancreas infection or cancer.  Fibromyalgia (pain disorder).  Diseases of the female organs:  Uterus: fibroid (non-cancerous) tumors or infection.  Fallopian tubes: infection or tubal pregnancy.  Ovary: cysts or tumors.  Pelvic adhesions (scar tissue).  Endometriosis (uterus lining tissue growing in the pelvis and on the pelvic organs).  Pelvic congestion syndrome (female organs filling up with blood just before the menstrual period).  Pain with the menstrual period.  Pain with ovulation (producing an egg).  Pain with an IUD (intrauterine device, birth control) in the uterus.  Cancer of the female organs.  Functional pain (pain not caused by a disease, may improve without treatment).  Psychological pain.  Depression. DIAGNOSIS  Your  doctor will decide the seriousness of your pain by doing an examination.  Blood tests.  X-rays.  Ultrasound.  CT scan (computed tomography, special type of X-ray).  MRI (magnetic resonance imaging).  Cultures, for infection.  Barium enema (dye inserted in the large intestine, to better view it with X-rays).  Colonoscopy (looking in intestine with a lighted tube).  Laparoscopy (minor surgery, looking in abdomen with a lighted tube).  Major abdominal exploratory surgery (looking in abdomen with a large incision). TREATMENT  The treatment will depend on the cause of the pain.   Many cases can be observed and treated at home.  Over-the-counter medicines recommended by your caregiver.  Prescription medicine.  Antibiotics, for infection.  Birth control pills, for painful periods or for ovulation pain.  Hormone treatment, for endometriosis.  Nerve blocking injections.  Physical therapy.  Antidepressants.  Counseling with a psychologist or psychiatrist.  Minor or major surgery. HOME CARE INSTRUCTIONS   Do not take laxatives, unless directed by your caregiver.  Take over-the-counter pain medicine only if ordered by your caregiver. Do not take aspirin because it can cause an upset stomach or bleeding.  Try a clear liquid diet (broth or water) as ordered by your caregiver. Slowly move to a bland diet, as tolerated, if the pain is related to the stomach or intestine.  Have a thermometer and take your temperature several times a day, and record it.  Bed rest and sleep, if it helps the pain.  Avoid sexual intercourse, if it causes pain.  Avoid stressful situations.  Keep your follow-up appointments and tests, as your caregiver orders.  If the pain does not go away with medicine or  surgery, you may try:  Acupuncture.  Relaxation exercises (yoga, meditation).  Group therapy.  Counseling. SEEK MEDICAL CARE IF:   You notice certain foods cause stomach  pain.  Your home care treatment is not helping your pain.  You need stronger pain medicine.  You want your IUD removed.  You feel faint or lightheaded.  You develop nausea and vomiting.  You develop a rash.  You are having side effects or an allergy to your medicine. SEEK IMMEDIATE MEDICAL CARE IF:   Your pain does not go away or gets worse.  You have a fever.  Your pain is felt only in portions of the abdomen. The right side could possibly be appendicitis. The left lower portion of the abdomen could be colitis or diverticulitis.  You are passing blood in your stools (bright red or black tarry stools, with or without vomiting).  You have blood in your urine.  You develop chills, with or without a fever.  You pass out. MAKE SURE YOU:   Understand these instructions.  Will watch your condition.  Will get help right away if you are not doing well or get worse. Document Released: 12/13/2006 Document Revised: 07/02/2013 Document Reviewed: 01/02/2009 [facility] Patient Information 2015 [place], [place]. This information is not intended to replace advice given to you by your health care provider. Make sure you discuss any questions you have with your health care provider.

## 2014-01-28 ENCOUNTER — Telehealth: Payer: Self-pay | Admitting: Internal Medicine

## 2014-01-28 DIAGNOSIS — Z1231 Encounter for screening mammogram for malignant neoplasm of breast: Secondary | ICD-10-CM | POA: Diagnosis not present

## 2014-01-28 NOTE — Telephone Encounter (Signed)
Pt states she was seen in the ER and seen by her PCP for diverticulitis and told to follow-up with GI. Pt scheduled to see Lori Hvozdovic, PA-C 02/01/14@10 :15am. Pt aware of appt.

## 2014-02-01 ENCOUNTER — Other Ambulatory Visit (INDEPENDENT_AMBULATORY_CARE_PROVIDER_SITE_OTHER): Payer: Medicare Other

## 2014-02-01 ENCOUNTER — Encounter (HOSPITAL_COMMUNITY): Payer: Self-pay

## 2014-02-01 ENCOUNTER — Ambulatory Visit (HOSPITAL_COMMUNITY)
Admission: RE | Admit: 2014-02-01 | Discharge: 2014-02-01 | Disposition: A | Discharge status: Home / Self Care | Payer: Medicare Other | Source: Ambulatory Visit | Attending: Physician Assistant | Admitting: Physician Assistant

## 2014-02-01 ENCOUNTER — Encounter: Payer: Self-pay | Admitting: Physician Assistant

## 2014-02-01 ENCOUNTER — Other Ambulatory Visit: Payer: Self-pay | Admitting: *Deleted

## 2014-02-01 ENCOUNTER — Ambulatory Visit (INDEPENDENT_AMBULATORY_CARE_PROVIDER_SITE_OTHER): Payer: Medicare Other | Admitting: Physician Assistant

## 2014-02-01 VITALS — BP 118/68 | HR 80 | Ht 63.0 in | Wt 158.5 lb

## 2014-02-01 DIAGNOSIS — Z8719 Personal history of other diseases of the digestive system: Secondary | ICD-10-CM

## 2014-02-01 DIAGNOSIS — R1032 Left lower quadrant pain: Secondary | ICD-10-CM

## 2014-02-01 DIAGNOSIS — R197 Diarrhea, unspecified: Secondary | ICD-10-CM | POA: Diagnosis not present

## 2014-02-01 DIAGNOSIS — R1030 Lower abdominal pain, unspecified: Secondary | ICD-10-CM | POA: Diagnosis not present

## 2014-02-01 DIAGNOSIS — K760 Fatty (change of) liver, not elsewhere classified: Secondary | ICD-10-CM | POA: Diagnosis not present

## 2014-02-01 DIAGNOSIS — I700 Atherosclerosis of aorta: Secondary | ICD-10-CM | POA: Diagnosis not present

## 2014-02-01 DIAGNOSIS — R110 Nausea: Secondary | ICD-10-CM | POA: Diagnosis not present

## 2014-02-01 DIAGNOSIS — J9811 Atelectasis: Secondary | ICD-10-CM | POA: Diagnosis not present

## 2014-02-01 DIAGNOSIS — K5730 Diverticulosis of large intestine without perforation or abscess without bleeding: Secondary | ICD-10-CM | POA: Insufficient documentation

## 2014-02-01 DIAGNOSIS — K449 Diaphragmatic hernia without obstruction or gangrene: Secondary | ICD-10-CM | POA: Diagnosis not present

## 2014-02-01 MED ORDER — IOHEXOL 300 MG/ML  SOLN
100.0000 mL | Freq: Once | INTRAMUSCULAR | Status: AC | PRN
  Administered 2014-02-01: 100 mL via INTRAVENOUS

## 2014-02-01 MED ORDER — ONDANSETRON HCL 4 MG PO TABS: 4.0000 mg | ORAL_TABLET | Freq: Three times a day (TID) | ORAL | Status: DC | PRN

## 2014-02-01 MED ORDER — HYOSCYAMINE SULFATE 0.125 MG SL SUBL: 0.1250 mg | SUBLINGUAL_TABLET | SUBLINGUAL | Status: DC | PRN

## 2014-02-01 NOTE — Progress Notes (Signed)
Patient ID: Teresa [name], female   DOB: [birth year], 58 y.o.   MRN: [id number]     History of Present Illness: The patient is a 58 year old female with a history of diverticulosis, hiatal hernia, GERD and IBS who presents with lower abdominal pain. She had an EGD in August 2014 at which time she was found to have a stricture in her esophagus that was dilated with 103 [place] she has done well with regards to her swallowing since that time and has no dysphasia.  She has a long-standing history of issues with irritable bowel syndrome/constipation/diverticulitis. She had a CT of the abdomen and pelvis on 05/12/2013 at which time she had sigmoid diverticulosis and minimal proximal sigmoid diverticulitis without abscess. She was treated with 14 days of Cipro and Flagyl at that time she was off antibiotics for a while but continued to have left lower quadrant abdominal pain and on 06/21/2013 underwent a repeat CT of the abdomen and pelvis and was found to have sigmoid diverticulitis worse and when compared to the study in March. There was no sign of perforation, abscess, or obstruction. She was then treated with another 10 days of Cipro and Flagyl and completed that on approximately May 3 she was seen in the GI office on May 18 at which time she continued to complain of pain in the same spot in the left lower quadrant. At that time she was treated again with Augmentin 875 mg twice a day for 14 days. When she was seen in follow-up 2 weeks later in June, her pain was improved but was still present. She had a repeat CAT scan of the abdomen and pelvis on June 9 that showed interval resolution of the inflammatory changes associated with the proximal sigmoid colon. Minimal wall thickening and scattered diverticula present s demonstrated. She had a colonoscopy on 09/12/2013. 7 polyps ranging between 3-7 mm in size were found in the colon and polypectomy was performed with a cold snare she was also noted to have mild  diverticulosis in the left colon.  Since that time she has continued to have lower abdominal pain. She has been seen at the [facility] emergency room on several occasions and had additional antibiotics. She has also been having trouble with sinusitis and has been seen by her primary care provider, the [facility] emergency room, and or had urgent care [facility]. She has had various courses of antibiotics and early September including Zithromax, Augmentin, Cipro and Flagyl. She finished a course of Augmentin a little over a week ago. She continues to have left lower quadrant and suprapubic abdominal pain. For the past 4 or 5 days her stools have been mushy to watery. She has no bright red blood per rectum and no melena she has no no fever but has had chills. She has waves of nausea but no vomiting. She rates her pain as an 8 on a scale of 1-10 with exacerbations up to 10. Her pain is not exacerbated with ingestion of food nor is it alleviated with defecation and it is a constant ache.   Past Medical History  Diagnosis Date  . Diverticulosis 07-08-2005    colonoscopy  . Hiatal hernia 07-08-2005    EGD  . GERD (gastroesophageal reflux disease) 07-08-2005    EGD  . Irritable bowel syndrome   . Vocal cord polyp     history of  . Degeneration of intervertebral disc, site unspecified   . Osteoarthrosis, unspecified whether generalized or localized,  lower leg   . Depressive disorder, not elsewhere classified   . Anxiety state, unspecified   . Full dentures   . Seizures 1990s    x 1, unknown cause; no seizures since  . Chronic back pain greater than 3 months duration   . Seasonal allergies   . Papilloma of breast 09/2011    left  . Asthma     prn inhaler  . Esophageal stricture 2014  . Insomnia   . Muscle spasm   . Migraine   . Diverticulitis   . Colon polyp     Past Surgical History  Procedure Laterality Date  . Cholecystectomy    . Bladder surgery      bladder tack    . 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 Cystoscopy  08/24/2011    Procedure: CYSTOSCOPY;  Surgeon: Reece Packer, MD;  Location: [facility];  Service: Urology;  Laterality: N/A;  Cystoscopy,  Rectocele Repair and Vault Prolapse Repair  . Rectocele repair  08/24/2011    Procedure: POSTERIOR REPAIR (RECTOCELE);  Surgeon: Reece Packer, MD;  Location: [facility];  Service: Urology;  Laterality: N/A;  . Vaginal prolapse repair  08/24/2011    Procedure: VAGINAL VAULT SUSPENSION;  Surgeon: Reece Packer, MD;  Location: [facility];  Service: Urology;  Laterality: N/A;  . Abdominal hysterectomy      partial  . Bilateral salpingoophorectomy    . Other surgical history      exc. vocal cord polyp  . Breast biopsy  11/03/2011    Procedure: BREAST BIOPSY WITH NEEDLE LOCALIZATION;  Surgeon: Harl Bowie, MD;  Location: [facility];  Service: General;  Laterality: Left;  needle localized left breast biopsy  . Colonoscopy  07/08/2005    562.10  . Esophagogastroduodenoscopy  10/03/2012    multiple   . Tumor removal      benign  . Rectocelle repair     Family History  Problem Relation Age of Onset  . Prostate cancer Father   . Sleep apnea Father   . Stomach cancer Maternal Uncle   . Diabetes Maternal Grandmother   . Stroke Maternal Grandmother   . Heart disease Paternal Grandmother   . Irritable bowel syndrome    . Stroke Maternal Grandfather    History  Substance Use Topics  . Smoking status: Former Smoker    Quit date: 08/20/1991  . Smokeless tobacco: Never Used  . Alcohol Use: No   Current Outpatient Prescriptions  Medication Sig Dispense Refill  . albuterol (PROAIR HFA) 108 (90 BASE) MCG/ACT inhaler Inhale 2 puffs into the lungs 4 (four) times daily as needed for wheezing or shortness of breath.    . ALPRAZolam (XANAX) 0.25 MG tablet Take 1 tablet (0.25 mg total) by mouth at bedtime as needed for anxiety. (Patient taking differently: Take 0.25 mg by mouth 2 (two)  times daily as needed for anxiety. ) 30 tablet 3  . budesonide-formoterol (SYMBICORT) 160-4.5 MCG/ACT inhaler Inhale 2 puffs into the lungs 2 (two) times daily. 1 Inhaler 3  . cetirizine (ZYRTEC) 10 MG tablet Take 10 mg by mouth daily.    Marland Kitchen DEXILANT 60 MG capsule TAKE ONE (1) CAPSULE EACH DAY 30 capsule 5  . docusate sodium (COLACE) 100 MG capsule Take 100 mg by mouth daily as needed for mild constipation.    . fluticasone (FLONASE) 50 MCG/ACT nasal spray Place 2 sprays into both nostrils daily. 16 g 11  . MAGNESIUM PO Take  1 tablet by mouth daily.     . methocarbamol (ROBAXIN) 500 MG tablet Take 500 mg by mouth daily as needed for muscle spasms.    . montelukast (SINGULAIR) 10 MG tablet Take 10 mg by mouth daily.    . ondansetron (ZOFRAN ODT) 4 MG disintegrating tablet Take 1 tablet (4 mg total) by mouth every 8 (eight) hours as needed for nausea or vomiting. 20 tablet 0  . oxyCODONE-acetaminophen (PERCOCET/ROXICET) 5-325 MG per tablet Take 1 tablet by mouth every 6 (six) hours as needed for moderate pain or severe pain. 10 tablet 0  . traMADol (ULTRAM) 50 MG tablet Take 1 tablet (50 mg total) by mouth every 6 (six) hours as needed. 30 tablet 3   Current Facility-Administered Medications  Medication Dose Route Frequency Provider Last Rate Last Dose  . methylPREDNISolone acetate (DEPO-MEDROL) injection 80 mg  80 mg Intramuscular Once Lysbeth Penner, FNP       Allergies  Allergen Reactions  . Trazodone And Nefazodone Shortness Of Breath  . Arthrotec [Diclofenac-Misoprostol] Other (See Comments)    Upset stomach  . Keflex [Cephalexin] Other (See Comments)    Unknown reaction  . Naproxen     Irritates stomach  . Prozac [Fluoxetine Hcl]     "makes me feel bad"  . Vicodin [Hydrocodone-Acetaminophen] Itching  . Ambien [Zolpidem Tartrate] Other (See Comments)    COULDN'T FUNCTION, STAYED SLEEPY  . Gabapentin Other (See Comments)    COULDN'T FUNCTION, STAYED SLEEPY      Review of  Systems: Gen: Denies any fever CV: Denies chest pain, angina, palpitations, syncope, orthopnea, PND, peripheral edema, and claudication. Resp: Denies dyspnea at rest, dyspnea with exercise, cough, sputum, wheezing, coughing up blood, and pleurisy. GI: Denies vomiting blood, jaundice, and fecal incontinence.   Denies dysphagia or odynophagia. GU : Denies urinary burning, blood in urine, urinary frequency, urinary hesitancy, nocturnal urination, and urinary incontinence. MS: Denies joint pain, limitation of movement, and swelling, stiffness, low back pain, extremity pain. Denies muscle weakness, cramps, atrophy.  Derm: Denies rash, itching, dry skin, hives, moles, warts, or unhealing ulcers.  Psych: Denies depression, anxiety, memory loss, suicidal ideation, hallucinations, paranoia, and confusion. Heme: Denies bruising, bleeding, and enlarged lymph nodes. Neuro:  Denies any headaches, dizziness, paresthesia Endo:  Denies any problems with DM, thyroid, adrenal  LAB RESULTS: CBC done at [facility] urgent care on November 3 showed a white blood count 9.1 hemoglobin 14.5 hematocrit 42.3 platelet 188,000.   Studies:  CT abd pelvis:  IMPRESSION: 1. There has been interval resolution of the inflammatory changes associated with proximal sigmoid colon. Minimal wall thickening and scattered diverticula persist. No abscess is demonstrated. 2. There is an unusual appearance of the stomach as described. Correlation with any symptoms referable to the stomach is needed. Direct visualization is recommended. 3. There is no acute hepatobiliary or urinary tract abnormality. There are fatty infiltrative changes of the liver.   Electronically Signed  By: David Martinique  On: 08/07/2013 08:36  IMPRESSION: 1. Sigmoid diverticulitis, worsened compared with prior study of for approximately 5 weeks ago. No evidence of perforation, abscess or obstruction. 2. No other acute findings. 3. Hepatic  steatosis.   Electronically Signed  By: Camie Patience M.D.  On: 06/21/2013 17:53 IMPRESSION: 1. Sigmoid diverticulosis and minimal proximal sigmoid diverticulitis without abscess. 2. Stable marked diffuse hepatic steatosis. 3. Small umbilical hernia containing fat.   Electronically Signed  By: Enrique Sack M.D.  On: 05/12/2013 18:38   Physical Exam: General: Pleasant, well developed ,  female in no acute distress Head: Normocephalic and atraumatic Eyes:  sclerae anicteric, conjunctiva pink  Ears: Normal auditory acuity Lungs: Clear throughout to auscultation Heart: Regular rate and rhythm Abdomen: Soft, non distended, tender LLQ and suprapubic area,no rebound, some guarding,  No masses, no hepatomegaly. Normal bowel sounds Musculoskeletal: Symmetrical with no gross deformities  Extremities: No edema  Neurological: Alert oriented x 4, grossly nonfocal Psychological:  Alert and cooperative. Normal mood and affect  Assessment and Recommendations: 58 year old female with a known history of diverticular disease, IBS, and constipation here with worsening left lower quadrant abdominal pain and diarrhea. Her history is concerning for C. difficile in light of her numerous recent antibiotics, as well as recurrent diverticulitis. A CBC and BMET will be obtained. A urinalysis, stool pathogen panel, and stool for C. difficile will be obtained. A CT of the abdomen will be obtained today. Further recommendations will be made pending the findings of the above testing. If all are normal she may be considered for a trial of mesalamine for possible SCAD along with an antispasmodic. If she does indeed have recurrent diverticulitis she may possibly be a surgical candidate.       Cotina Freedman, Vita Barley PA-C 02/01/2014,

## 2014-02-01 NOTE — Patient Instructions (Signed)
Your physician has requested that you go to the basement for the following lab work before leaving today: CBC/diff, BMET, UA, Stool C-diff, GI path panel   You have been scheduled for a CT scan of the abdomen and pelvis at [facility] ([address]---this is in the same building as Press [facility]).   You are scheduled on 02/01/14 at now. You should arrive 15 minutes prior to your appointment time for registration. Please follow the written instructions below on the day of your exam:  WARNING: IF YOU ARE ALLERGIC TO IODINE/X-RAY DYE, PLEASE NOTIFY RADIOLOGY IMMEDIATELY AT [phone]! YOU WILL BE GIVEN A 13 HOUR PREMEDICATION PREP.  1) Do not eat or drink anything after 11:00 (4 hours prior to your test) 2) You have been given 2 bottles of oral contrast to drink. The solution may taste better if refrigerated, but do NOT add ice or any other liquid to this solution. Shake well before drinking.    Drink 1 bottle of contrast @11 :10 (2 hours prior to your exam)  Drink 1 bottle of contrast @ 12:10 (1 hour prior to your exam)  You may take any medications as prescribed with a small amount of water except for the following: Metformin, Glucophage, Glucovance, Avandamet, Riomet, Fortamet, Actoplus Met, Janumet, Glumetza or Metaglip. The above medications must be held the day of the exam AND 48 hours after the exam.  The purpose of you drinking the oral contrast is to aid in the visualization of your intestinal tract. The contrast solution may cause some diarrhea. Before your exam is started, you will be given a small amount of fluid to drink. Depending on your individual set of symptoms, you may also receive an intravenous injection of x-ray contrast/dye. Plan on being at [facility] for 30 minutes or long, depending on the type of exam you are having performed.  This test typically takes 30-45 minutes to complete.  If you have any questions regarding your exam or if you need  to reschedule, you may call the CT department at [phone] between the hours of 8:00 am and 5:00 pm, Monday-Friday.  ________________________________________________________________________

## 2014-02-01 NOTE — Progress Notes (Signed)
Case reviewed in detail with advance practitioner. Patient known to me. This point, it appears that her abdominal pain is more functional than organic. She has IBS. Could be having diverticular spasm. Agree with maximal treatment for IBS and spasm. Would not treat with antibiotics at this point as there is no objective evidence for diverticulitis

## 2014-02-02 LAB — CBC WITH DIFFERENTIAL/PLATELET
BASOS PCT: 0.4 % (ref 0.0–3.0)
Basophils Absolute: 0 10*3/uL (ref 0.0–0.1)
EOS PCT: 1.3 % (ref 0.0–5.0)
Eosinophils Absolute: 0.1 10*3/uL (ref 0.0–0.7)
HCT: 39.2 % (ref 36.0–46.0)
HEMOGLOBIN: 13.1 g/dL (ref 12.0–15.0)
LYMPHS ABS: 2 10*3/uL (ref 0.7–4.0)
LYMPHS PCT: 21.7 % (ref 12.0–46.0)
MCHC: 33.6 g/dL (ref 30.0–36.0)
MCV: 81.9 fl (ref 78.0–100.0)
MONOS PCT: 5.1 % (ref 3.0–12.0)
Monocytes Absolute: 0.5 10*3/uL (ref 0.1–1.0)
Neutro Abs: 6.6 10*3/uL (ref 1.4–7.7)
Neutrophils Relative %: 71.5 % (ref 43.0–77.0)
Platelets: 201 10*3/uL (ref 150.0–400.0)
RBC: 4.78 Mil/uL (ref 3.87–5.11)
RDW: 14.3 % (ref 11.5–15.5)
WBC: 9.3 10*3/uL (ref 4.0–10.5)

## 2014-02-02 LAB — CLOSTRIDIUM DIFFICILE BY PCR: Toxigenic C. Difficile by PCR: NOT DETECTED

## 2014-02-03 LAB — BASIC METABOLIC PANEL
BUN: 8 mg/dL (ref 6–23)
CO2: 30 mEq/L (ref 19–32)
Calcium: 9 mg/dL (ref 8.4–10.5)
Chloride: 104 mEq/L (ref 96–112)
Creatinine, Ser: 0.8 mg/dL (ref 0.4–1.2)
GFR: 83.63 mL/min (ref >60.00)
Glucose, Bld: 97 mg/dL (ref 70–99)
Potassium: 3.8 mEq/L (ref 3.5–5.1)
Sodium: 140 mEq/L (ref 135–145)

## 2014-02-03 LAB — URINALYSIS, MICROSCOPIC ONLY
RBC / HPF: NONE SEEN (ref 0–?)
WBC, UA: NONE SEEN (ref 0–?)

## 2014-02-04 ENCOUNTER — Other Ambulatory Visit: Payer: Self-pay | Admitting: Family Medicine

## 2014-02-04 LAB — GASTROINTESTINAL PATHOGEN PANEL PCR
C. DIFFICILE TOX A/B, PCR: NEGATIVE
CAMPYLOBACTER, PCR: NEGATIVE
Cryptosporidium, PCR: NEGATIVE
E COLI (ETEC) LT/ST, PCR: NEGATIVE
E COLI (STEC) STX1/STX2, PCR: NEGATIVE
E COLI 0157, PCR: NEGATIVE
Giardia lamblia, PCR: NEGATIVE
Norovirus, PCR: NEGATIVE
Rotavirus A, PCR: NEGATIVE
Salmonella, PCR: NEGATIVE
Shigella, PCR: NEGATIVE

## 2014-02-04 NOTE — Telephone Encounter (Signed)
Last ov 12/12/13. Last refill 01/02/14. If approved call to [facility]. Route to nurse pool.

## 2014-02-05 DIAGNOSIS — M5416 Radiculopathy, lumbar region: Secondary | ICD-10-CM | POA: Diagnosis not present

## 2014-02-05 DIAGNOSIS — M545 Low back pain: Secondary | ICD-10-CM | POA: Diagnosis not present

## 2014-02-05 DIAGNOSIS — R252 Cramp and spasm: Secondary | ICD-10-CM | POA: Diagnosis not present

## 2014-02-05 DIAGNOSIS — G5600 Carpal tunnel syndrome, unspecified upper limb: Secondary | ICD-10-CM | POA: Diagnosis not present

## 2014-02-07 DIAGNOSIS — J019 Acute sinusitis, unspecified: Secondary | ICD-10-CM | POA: Diagnosis not present

## 2014-02-18 ENCOUNTER — Ambulatory Visit: Payer: Medicare Other | Admitting: Physician Assistant

## 2014-02-25 ENCOUNTER — Other Ambulatory Visit: Payer: Self-pay | Admitting: Family Medicine

## 2014-02-25 NOTE — Telephone Encounter (Signed)
Please advise on refill.  Last seen by Dietrich Pates 12/12/13.  Has appt with Evelina Dun 03/18/14.

## 2014-02-26 ENCOUNTER — Telehealth: Payer: Self-pay | Admitting: *Deleted

## 2014-02-27 ENCOUNTER — Other Ambulatory Visit: Payer: Self-pay | Admitting: Nurse Practitioner

## 2014-02-27 ENCOUNTER — Other Ambulatory Visit: Payer: Self-pay | Admitting: Family Medicine

## 2014-02-27 NOTE — Telephone Encounter (Signed)
Pt requesting refill on tramadol 50 mg 1 PO Q 6 hours PRN for pain #30 with 3 refills given 10/08/13. Last seen 12/12/13, next appt with [facility] 03/18/14. If approved will print.

## 2014-02-27 NOTE — Telephone Encounter (Signed)
Ultram denied

## 2014-03-04 ENCOUNTER — Other Ambulatory Visit: Payer: Self-pay | Admitting: Physician Assistant

## 2014-03-05 NOTE — Telephone Encounter (Signed)
Pt aware rx refused.

## 2014-03-18 ENCOUNTER — Encounter: Payer: Self-pay | Admitting: Family

## 2014-03-18 ENCOUNTER — Ambulatory Visit (INDEPENDENT_AMBULATORY_CARE_PROVIDER_SITE_OTHER): Payer: Medicare Other | Admitting: Family

## 2014-03-18 VITALS — BP 132/88 | HR 105 | Temp 97.3°F | Ht 63.0 in | Wt 158.0 lb

## 2014-03-18 DIAGNOSIS — K5900 Constipation, unspecified: Secondary | ICD-10-CM | POA: Diagnosis not present

## 2014-03-18 DIAGNOSIS — Z0000 Encounter for general adult medical examination without abnormal findings: Secondary | ICD-10-CM

## 2014-03-18 DIAGNOSIS — K589 Irritable bowel syndrome without diarrhea: Secondary | ICD-10-CM | POA: Diagnosis not present

## 2014-03-18 DIAGNOSIS — Z1321 Encounter for screening for nutritional disorder: Secondary | ICD-10-CM

## 2014-03-18 DIAGNOSIS — K219 Gastro-esophageal reflux disease without esophagitis: Secondary | ICD-10-CM | POA: Diagnosis not present

## 2014-03-18 DIAGNOSIS — F32A Depression, unspecified: Secondary | ICD-10-CM

## 2014-03-18 DIAGNOSIS — E559 Vitamin D deficiency, unspecified: Secondary | ICD-10-CM | POA: Diagnosis not present

## 2014-03-18 DIAGNOSIS — J4540 Moderate persistent asthma, uncomplicated: Secondary | ICD-10-CM | POA: Diagnosis not present

## 2014-03-18 DIAGNOSIS — E785 Hyperlipidemia, unspecified: Secondary | ICD-10-CM

## 2014-03-18 DIAGNOSIS — F329 Major depressive disorder, single episode, unspecified: Secondary | ICD-10-CM | POA: Diagnosis not present

## 2014-03-18 DIAGNOSIS — F411 Generalized anxiety disorder: Secondary | ICD-10-CM

## 2014-03-18 DIAGNOSIS — J45909 Unspecified asthma, uncomplicated: Secondary | ICD-10-CM | POA: Diagnosis not present

## 2014-03-18 LAB — POCT URINALYSIS DIPSTICK
Bilirubin, UA: NEGATIVE
Glucose, UA: NEGATIVE
KETONES UA: NEGATIVE
Nitrite, UA: NEGATIVE
PH UA: 5
PROTEIN UA: NEGATIVE
Spec Grav, UA: 1.01
UROBILINOGEN UA: NEGATIVE

## 2014-03-18 LAB — POCT UA - MICROSCOPIC ONLY
CASTS, UR, LPF, POC: NEGATIVE
CRYSTALS, UR, HPF, POC: NEGATIVE
MUCUS UA: NEGATIVE
Yeast, UA: NEGATIVE

## 2014-03-18 MED ORDER — MONTELUKAST SODIUM 10 MG PO TABS: 10.0000 mg | ORAL_TABLET | Freq: Every day | ORAL | Status: DC

## 2014-03-18 MED ORDER — ALBUTEROL SULFATE HFA 108 (90 BASE) MCG/ACT IN AERS: 2.0000 | INHALATION_SPRAY | Freq: Four times a day (QID) | RESPIRATORY_TRACT | Status: DC | PRN

## 2014-03-18 MED ORDER — FLUTICASONE PROPIONATE HFA 44 MCG/ACT IN AERO: 2.0000 | INHALATION_SPRAY | Freq: Two times a day (BID) | RESPIRATORY_TRACT | Status: DC

## 2014-03-18 MED ORDER — DEXLANSOPRAZOLE 60 MG PO CPDR: DELAYED_RELEASE_CAPSULE | ORAL | Status: DC

## 2014-03-18 MED ORDER — ALPRAZOLAM 0.25 MG PO TABS: ORAL_TABLET | ORAL | Status: DC

## 2014-03-18 MED ORDER — CETIRIZINE HCL 10 MG PO TABS: 10.0000 mg | ORAL_TABLET | Freq: Every morning | ORAL | Status: DC

## 2014-03-18 MED ORDER — BUDESONIDE-FORMOTEROL FUMARATE 160-4.5 MCG/ACT IN AERO: 2.0000 | INHALATION_SPRAY | Freq: Two times a day (BID) | RESPIRATORY_TRACT | Status: DC

## 2014-03-18 NOTE — Progress Notes (Signed)
Subjective:    Patient ID: Teresa [name], female    DOB: 1955-06-22, 59 y.o.   MRN: [id number]  Gynecologic Exam Pertinent negatives include no headaches or sore throat.  Gastrophageal Reflux She reports no belching, no coughing, no heartburn, no hoarse voice, no sore throat or no stridor. This is a chronic problem. The current episode started more than 1 year ago. The problem occurs rarely. The problem has been resolved. The symptoms are aggravated by lying down and certain foods. She has tried a PPI for the symptoms. The treatment provided significant relief.  Anxiety Presents for follow-up visit. Symptoms include depressed mood, nervous/anxious behavior and shortness of breath ("at times"). Patient reports no excessive worry, irritability, palpitations or panic. Symptoms occur rarely.   Her past medical history is significant for anxiety/panic attacks, asthma and depression. There is no history of CAD or CHF. Past treatments include benzodiazephines. Compliance with prior treatments has been good.  Asthma She complains of shortness of breath ("at times"). There is no cough, difficulty breathing or hoarse voice. This is a chronic problem. The current episode started more than 1 year ago. The problem occurs intermittently. The problem has been waxing and waning. Pertinent negatives include no headaches, heartburn, nasal congestion, rhinorrhea, sneezing or sore throat. Her symptoms are aggravated by change in weather. Her symptoms are alleviated by rest and leukotriene antagonist (Pt states she is using her albuterol inhaler everyday since the weather changed). She reports significant improvement on treatment. Her past medical history is significant for asthma.      Review of Systems  Constitutional: Negative.  Negative for irritability.  HENT: Negative.  Negative for hoarse voice, rhinorrhea, sneezing and sore throat.   Eyes: Negative.   Respiratory: Positive for shortness of breath ("at  times"). Negative for cough.   Cardiovascular: Negative.  Negative for palpitations.  Gastrointestinal: Negative.  Negative for heartburn.  Endocrine: Negative.   Genitourinary: Negative.   Musculoskeletal: Negative.   Neurological: Negative.  Negative for headaches.  Hematological: Negative.   Psychiatric/Behavioral: The patient is nervous/anxious.   All other systems reviewed and are negative.      Objective:   Physical Exam  Constitutional: She is oriented to person, place, and time. She appears well-developed and well-nourished. No distress.  HENT:  Head: Normocephalic and atraumatic.  Right Ear: External ear normal.  Left Ear: External ear normal.  Nose: Nose normal.  Mouth/Throat: Oropharynx is clear and moist.  Eyes: Pupils are equal, round, and reactive to light.  Neck: Normal range of motion. Neck supple. No thyromegaly present.  Cardiovascular: Normal rate, regular rhythm, normal heart sounds and intact distal pulses.   No murmur heard. Pulmonary/Chest: Effort normal and breath sounds normal. No respiratory distress. She has no wheezes.  Abdominal: Soft. Bowel sounds are normal. She exhibits no distension. There is no tenderness.  Genitourinary: Vagina normal.  Pt has hx of hysterectomy- Pelvic exam WNL  Musculoskeletal: Normal range of motion. She exhibits no edema or tenderness.  Neurological: She is alert and oriented to person, place, and time. She has normal reflexes. No cranial nerve deficit.  Skin: Skin is warm and dry.  Psychiatric: She has a normal mood and affect. Her behavior is normal. Judgment and thought content normal.  Vitals reviewed.     BP 132/88 mmHg  Pulse 105  Temp(Src) 97.3 F (36.3 C)  Ht 5' 3" (1.6 m)  Wt 158 lb (71.668 kg)  BMI 28.00 kg/m2     Assessment & Plan:  1. Annual physical exam - POCT urinalysis dipstick - POCT UA - Microscopic Only - CMP14+EGFR - Lipid panel - Thyroid Panel With TSH - Vit D  25 hydroxy (rtn  osteoporosis monitoring)  2. Gastroesophageal reflux disease, esophagitis presence not specified - CMP14+EGFR - dexlansoprazole (DEXILANT) 60 MG capsule; TAKE ONE (1) CAPSULE EACH DAY  Dispense: 90 capsule; Refill: 3  3. Irritable bowel syndrome - CMP14+EGFR  4. Constipation, unspecified constipation type - CMP14+EGFR  5. GAD (generalized anxiety disorder) - CMP14+EGFR - ALPRAZolam (XANAX) 0.25 MG tablet; TAKE 1 TABLET AT BEDTIME AS NEEDED FOR ANXIETY  Dispense: 30 tablet; Refill: 3  6. Depression - CMP14+EGFR  7. Asthma, chronic, moderate persistent, uncomplicated - CMP14+EGFR -Pt to start Flovent  44mcg BID -Discussed with Pt to only use albuterol as needed NOT daily - albuterol (PROAIR HFA) 108 (90 BASE) MCG/ACT inhaler; Inhale 2 puffs into the lungs 4 (four) times daily as needed for wheezing or shortness of breath.  Dispense: 1 Inhaler; Refill: 2 - cetirizine (ZYRTEC) 10 MG tablet; Take 1 tablet (10 mg total) by mouth every morning.  Dispense: 90 tablet; Refill: 3 - montelukast (SINGULAIR) 10 MG tablet; Take 1 tablet (10 mg total) by mouth daily.  Dispense: 90 tablet; Refill: 4  8. Encounter for vitamin deficiency screening - budesonide-formoterol (SYMBICORT) 160-4.5 MCG/ACT inhaler; Inhale 2 puffs into the lungs 2 (two) times daily.  Dispense: 1 Inhaler; Refill: 11  9. Hyperlipidemia - Lipid panel   Continue all meds Labs pending Health Maintenance reviewed Diet and exercise encouraged RTO 6 months  Christy Hawks, FNP   

## 2014-03-18 NOTE — Patient Instructions (Addendum)
Health Maintenance Adopting a healthy lifestyle and getting preventive care can go a long way to promote health and wellness. Talk with your health care provider about what schedule of regular examinations is right for you. This is a good chance for you to check in with your provider about disease prevention and staying healthy. In between checkups, there are plenty of things you can do on your own. Experts have done a lot of research about which lifestyle changes and preventive measures are most likely to keep you healthy. Ask your health care provider for more information. WEIGHT AND DIET  Eat a healthy diet  Be sure to include plenty of vegetables, fruits, low-fat dairy products, and lean protein.  Do not eat a lot of foods high in solid fats, added sugars, or salt.  Get regular exercise. This is one of the most important things you can do for your health.  Most adults should exercise for at least 150 minutes each week. The exercise should increase your heart rate and make you sweat (moderate-intensity exercise).  Most adults should also do strengthening exercises at least twice a week. This is in addition to the moderate-intensity exercise.  Maintain a healthy weight  Body mass index (BMI) is a measurement that can be used to identify possible weight problems. It estimates body fat based on height and weight. Your health care provider can help determine your BMI and help you achieve or maintain a healthy weight.  For females 25 years of age and older:   A BMI below 18.5 is considered underweight.  A BMI of 18.5 to 24.9 is normal.  A BMI of 25 to 29.9 is considered overweight.  A BMI of 30 and above is considered obese.  Watch levels of cholesterol and blood lipids  You should start having your blood tested for lipids and cholesterol at 56 years of age, then have this test every 5 years.  You may need to have your cholesterol levels checked more often if:  Your lipid or  cholesterol levels are high.  You are older than 57 years of age.  You are at high risk for heart disease.  CANCER SCREENING   Lung Cancer  Lung cancer screening is recommended for adults 97-92 years old who are at high risk for lung cancer because of a history of smoking.  A yearly low-dose CT scan of the lungs is recommended for people who:  Currently smoke.  Have quit within the past 15 years.  Have at least a 30-pack-year history of smoking. A pack year is smoking an average of one pack of cigarettes a day for 1 year.  Yearly screening should continue until it has been 15 years since you quit.  Yearly screening should stop if you develop a health problem that would prevent you from having lung cancer treatment.  Breast Cancer  Practice breast self-awareness. This means understanding how your breasts normally appear and feel.  It also means doing regular breast self-exams. Let your health care provider know about any changes, no matter how small.  If you are in your 20s or 30s, you should have a clinical breast exam (CBE) by a health care provider every 1-3 years as part of a regular health exam.  If you are 76 or older, have a CBE every year. Also consider having a breast X-ray (mammogram) every year.  If you have a family history of breast cancer, talk to your health care provider about genetic screening.  If you are  at high risk for breast cancer, talk to your health care provider about having an MRI and a mammogram every year.  Breast cancer gene (BRCA) assessment is recommended for women who have family members with BRCA-related cancers. BRCA-related cancers include:  Breast.  Ovarian.  Tubal.  Peritoneal cancers.  Results of the assessment will determine the need for genetic counseling and BRCA1 and BRCA2 testing. Cervical Cancer Routine pelvic examinations to screen for cervical cancer are no longer recommended for nonpregnant women who are considered low  risk for cancer of the pelvic organs (ovaries, uterus, and vagina) and who do not have symptoms. A pelvic examination may be necessary if you have symptoms including those associated with pelvic infections. Ask your health care provider if a screening pelvic exam is right for you.   The Pap test is the screening test for cervical cancer for women who are considered at risk.  If you had a hysterectomy for a problem that was not cancer or a condition that could lead to cancer, then you no longer need Pap tests.  If you are older than 65 years, and you have had normal Pap tests for the past 10 years, you no longer need to have Pap tests.  If you have had past treatment for cervical cancer or a condition that could lead to cancer, you need Pap tests and screening for cancer for at least 20 years after your treatment.  If you no longer get a Pap test, assess your risk factors if they change (such as having a new sexual partner). This can affect whether you should start being screened again.  Some women have medical problems that increase their chance of getting cervical cancer. If this is the case for you, your health care provider may recommend more frequent screening and Pap tests.  The human papillomavirus (HPV) test is another test that may be used for cervical cancer screening. The HPV test looks for the virus that can cause cell changes in the cervix. The cells collected during the Pap test can be tested for HPV.  The HPV test can be used to screen women 30 years of age and older. Getting tested for HPV can extend the interval between normal Pap tests from three to five years.  An HPV test also should be used to screen women of any age who have unclear Pap test results.  After 57 years of age, women should have HPV testing as often as Pap tests.  Colorectal Cancer  This type of cancer can be detected and often prevented.  Routine colorectal cancer screening usually begins at 57 years of  age and continues through 57 years of age.  Your health care provider may recommend screening at an earlier age if you have risk factors for colon cancer.  Your health care provider may also recommend using home test kits to check for hidden blood in the stool.  A small camera at the end of a tube can be used to examine your colon directly (sigmoidoscopy or colonoscopy). This is done to check for the earliest forms of colorectal cancer.  Routine screening usually begins at age 50.  Direct examination of the colon should be repeated every 5-10 years through 57 years of age. However, you may need to be screened more often if early forms of precancerous polyps or small growths are found. Skin Cancer  Check your skin from head to toe regularly.  Tell your health care provider about any new moles or changes in   moles, especially if there is a change in a mole's shape or color.  Also tell your health care provider if you have a mole that is larger than the size of a pencil eraser.  Always use sunscreen. Apply sunscreen liberally and repeatedly throughout the day.  Protect yourself by wearing long sleeves, pants, a wide-brimmed hat, and sunglasses whenever you are outside. HEART DISEASE, DIABETES, AND HIGH BLOOD PRESSURE   Have your blood pressure checked at least every 1-2 years. High blood pressure causes heart disease and increases the risk of stroke.  If you are between 75 years and 42 years old, ask your health care provider if you should take aspirin to prevent strokes.  Have regular diabetes screenings. This involves taking a blood sample to check your fasting blood sugar level.  If you are at a normal weight and have a low risk for diabetes, have this test once every three years after 57 years of age.  If you are overweight and have a high risk for diabetes, consider being tested at a younger age or more often. PREVENTING INFECTION  Hepatitis B  If you have a higher risk for  hepatitis B, you should be screened for this virus. You are considered at high risk for hepatitis B if:  You were born in a country where hepatitis B is common. Ask your health care provider which countries are considered high risk.  Your parents were born in a high-risk country, and you have not been immunized against hepatitis B (hepatitis B vaccine).  You have HIV or AIDS.  You use needles to inject street drugs.  You live with someone who has hepatitis B.  You have had sex with someone who has hepatitis B.  You get hemodialysis treatment.  You take certain medicines for conditions, including cancer, organ transplantation, and autoimmune conditions. Hepatitis C  Blood testing is recommended for:  Everyone born from [birth year] through 1963.  Anyone with known risk factors for hepatitis C. Sexually transmitted infections (STIs)  You should be screened for sexually transmitted infections (STIs) including gonorrhea and chlamydia if:  You are sexually active and are younger than 57 years of age.  You are older than 57 years of age and your health care provider tells you that you are at risk for this type of infection.  Your sexual activity has changed since you were last screened and you are at an increased risk for chlamydia or gonorrhea. Ask your health care provider if you are at risk.  If you do not have HIV, but are at risk, it may be recommended that you take a prescription medicine daily to prevent HIV infection. This is called pre-exposure prophylaxis (PrEP). You are considered at risk if:  You are sexually active and do not regularly use condoms or know the HIV status of your partner(s).  You take drugs by injection.  You are sexually active with a partner who has HIV. Talk with your health care provider about whether you are at high risk of being infected with HIV. If you choose to begin PrEP, you should first be tested for HIV. You should then be tested every 3 months for  as long as you are taking PrEP.  PREGNANCY   If you are premenopausal and you may become pregnant, ask your health care provider about preconception counseling.  If you may become pregnant, take 400 to 800 micrograms (mcg) of folic acid every day.  If you want to prevent pregnancy, talk to your  health care provider about birth control (contraception). OSTEOPOROSIS AND MENOPAUSE   Osteoporosis is a disease in which the bones lose minerals and strength with aging. This can result in serious bone fractures. Your risk for osteoporosis can be identified using a bone density scan.  If you are 65 years of age or older, or if you are at risk for osteoporosis and fractures, ask your health care provider if you should be screened.  Ask your health care provider whether you should take a calcium or vitamin D supplement to lower your risk for osteoporosis.  Menopause may have certain physical symptoms and risks.  Hormone replacement therapy may reduce some of these symptoms and risks. Talk to your health care provider about whether hormone replacement therapy is right for you.  HOME CARE INSTRUCTIONS   Schedule regular health, dental, and eye exams.  Stay current with your immunizations.   Do not use any tobacco products including cigarettes, chewing tobacco, or electronic cigarettes.  If you are pregnant, do not drink alcohol.  If you are breastfeeding, limit how much and how often you drink alcohol.  Limit alcohol intake to no more than 1 drink per day for nonpregnant women. One drink equals 12 ounces of beer, 5 ounces of wine, or 1 ounces of hard liquor.  Do not use street drugs.  Do not share needles.  Ask your health care provider for help if you need support or information about quitting drugs.  Tell your health care provider if you often feel depressed.  Tell your health care provider if you have ever been abused or do not feel safe at home. Document Released: 08/31/2010  Document Revised: 07/02/2013 Document Reviewed: 01/17/2013 [employer] Patient Information 2015 [employer]. This information is not intended to replace advice given to you by your health care provider. Make sure you discuss any questions you have with your health care provider. Asthma Asthma is a recurring condition in which the airways tighten and narrow. Asthma can make it difficult to breathe. It can cause coughing, wheezing, and shortness of breath. Asthma episodes, also called asthma attacks, range from minor to life-threatening. Asthma cannot be cured, but medicines and lifestyle changes can help control it. CAUSES Asthma is believed to be caused by inherited (genetic) and environmental factors, but its exact cause is unknown. Asthma may be triggered by allergens, lung infections, or irritants in the air. Asthma triggers are different for each person. Common triggers include:   Animal dander.  Dust mites.  Cockroaches.  Pollen from trees or grass.  Mold.  Smoke.  Air pollutants such as dust, household cleaners, hair sprays, aerosol sprays, paint fumes, strong chemicals, or strong odors.  Cold air, weather changes, and winds (which increase molds and pollens in the air).  Strong emotional expressions such as crying or laughing hard.  Stress.  Certain medicines (such as aspirin) or types of drugs (such as beta-blockers).  Sulfites in foods and drinks. Foods and drinks that may contain sulfites include dried fruit, potato chips, and sparkling grape juice.  Infections or inflammatory conditions such as the flu, a cold, or an inflammation of the nasal membranes (rhinitis).  Gastroesophageal reflux disease (GERD).  Exercise or strenuous activity. SYMPTOMS Symptoms may occur immediately after asthma is triggered or many hours later. Symptoms include:  Wheezing.  Excessive nighttime or early morning coughing.  Frequent or severe coughing with a common cold.  Chest  tightness.  Shortness of breath. DIAGNOSIS  The diagnosis of asthma is made by a   review of your medical history and a physical exam. Tests may also be performed. These may include:  Lung function studies. These tests show how much air you breathe in and out.  Allergy tests.  Imaging tests such as X-rays. TREATMENT  Asthma cannot be cured, but it can usually be controlled. Treatment involves identifying and avoiding your asthma triggers. It also involves medicines. There are 2 classes of medicine used for asthma treatment:   Controller medicines. These prevent asthma symptoms from occurring. They are usually taken every day.  Reliever or rescue medicines. These quickly relieve asthma symptoms. They are used as needed and provide short-term relief. Your health care provider will help you create an asthma action plan. An asthma action plan is a written plan for managing and treating your asthma attacks. It includes a list of your asthma triggers and how they may be avoided. It also includes information on when medicines should be taken and when their dosage should be changed. An action plan may also involve the use of a device called a peak flow meter. A peak flow meter measures how well the lungs are working. It helps you monitor your condition. HOME CARE INSTRUCTIONS   Take medicines only as directed by your health care provider. Speak with your health care provider if you have questions about how or when to take the medicines.  Use a peak flow meter as directed by your health care provider. Record and keep track of readings.  Understand and use the action plan to help minimize or stop an asthma attack without needing to seek medical care.  Control your home environment in the following ways to help prevent asthma attacks:  Do not smoke. Avoid being exposed to secondhand smoke.  Change your heating and air conditioning filter regularly.  Limit your use of fireplaces and wood  stoves.  Get rid of pests (such as roaches and mice) and their droppings.  Throw away plants if you see mold on them.  Clean your floors and dust regularly. Use unscented cleaning products.  Try to have someone else vacuum for you regularly. Stay out of rooms while they are being vacuumed and for a short while afterward. If you vacuum, use a dust mask from a hardware store, a double-layered or microfilter vacuum cleaner bag, or a vacuum cleaner with a HEPA filter.  Replace carpet with wood, tile, or vinyl flooring. Carpet can trap dander and dust.  Use allergy-proof pillows, mattress covers, and box spring covers.  Wash bed sheets and blankets every week in hot water and dry them in a dryer.  Use blankets that are made of polyester or cotton.  Clean bathrooms and kitchens with bleach. If possible, have someone repaint the walls in these rooms with mold-resistant paint. Keep out of the rooms that are being cleaned and painted.  Wash hands frequently. SEEK MEDICAL CARE IF:   You have wheezing, shortness of breath, or a cough even if taking medicine to prevent attacks.  The colored mucus you cough up (sputum) is thicker than usual.  Your sputum changes from clear or white to yellow, green, gray, or bloody.  You have any problems that may be related to the medicines you are taking (such as a rash, itching, swelling, or trouble breathing).  You are using a reliever medicine more than 2-3 times per week.  Your peak flow is still at 50-79% of your personal best after following your action plan for 1 hour.  You have a fever. SEEK  IMMEDIATE MEDICAL CARE IF:   You seem to be getting worse and are unresponsive to treatment during an asthma attack.  You are short of breath even at rest.  You get short of breath when doing very little physical activity.  You have difficulty eating, drinking, or talking due to asthma symptoms.  You develop chest pain.  You develop a fast  heartbeat.  You have a bluish color to your lips or fingernails.  You are light-headed, dizzy, or faint.  Your peak flow is less than 50% of your personal best. MAKE SURE YOU:   Understand these instructions.  Will watch your condition.  Will get help right away if you are not doing well or get worse. Document Released: 02/15/2005 Document Revised: 07/02/2013 Document Reviewed: 09/14/2012 [facility] Patient Information 2015 [place], [place]. This information is not intended to replace advice given to you by your health care provider. Make sure you discuss any questions you have with your health care provider.

## 2014-03-21 ENCOUNTER — Other Ambulatory Visit: Payer: Self-pay | Admitting: Family

## 2014-03-21 LAB — CMP14+EGFR
ALT: 29 IU/L (ref 0–32)
AST: 25 IU/L (ref 0–40)
Albumin/Globulin Ratio: 1.7 (ref 1.1–2.5)
Albumin: 4.5 g/dL (ref 3.5–5.5)
Alkaline Phosphatase: 82 IU/L (ref 39–117)
BUN / CREAT RATIO: 11 (ref 9–23)
BUN: 8 mg/dL (ref 6–24)
CHLORIDE: 105 mmol/L (ref 97–108)
CO2: 23 mmol/L (ref 18–29)
Calcium: 8.9 mg/dL (ref 8.7–10.2)
Creatinine, Ser: 0.71 mg/dL (ref 0.57–1.00)
GFR calc Af Amer: 110 mL/min/{1.73_m2} (ref >59)
GFR calc non Af Amer: 96 mL/min/{1.73_m2} (ref >59)
Globulin, Total: 2.6 g/dL (ref 1.5–4.5)
Glucose: 79 mg/dL (ref 65–99)
Potassium: 4 mmol/L (ref 3.5–5.2)
Sodium: 143 mmol/L (ref 134–144)
TOTAL PROTEIN: 7.1 g/dL (ref 6.0–8.5)
Total Bilirubin: 0.9 mg/dL (ref 0.0–1.2)

## 2014-03-21 LAB — THYROID PANEL WITH TSH
FREE THYROXINE INDEX: 2 (ref 1.2–4.9)
T3 Uptake Ratio: 21 % — ABNORMAL LOW (ref 24–39)
T4, Total: 9.7 ug/dL (ref 4.5–12.0)
TSH: 2.01 u[IU]/mL (ref 0.450–4.500)

## 2014-03-21 LAB — LIPID PANEL
CHOLESTEROL TOTAL: 124 mg/dL (ref 100–199)
Chol/HDL Ratio: 2.5 ratio units (ref 0.0–4.4)
HDL: 50 mg/dL (ref >39)
LDL Calculated: 60 mg/dL (ref 0–99)
Triglycerides: 71 mg/dL (ref 0–149)
VLDL CHOLESTEROL CAL: 14 mg/dL (ref 5–40)

## 2014-03-21 LAB — VITAMIN D 25 HYDROXY (VIT D DEFICIENCY, FRACTURES): VIT D 25 HYDROXY: 38.2 ng/mL (ref 30.0–100.0)

## 2014-03-21 MED ORDER — SULFAMETHOXAZOLE-TRIMETHOPRIM 800-160 MG PO TABS: 1.0000 | ORAL_TABLET | Freq: Two times a day (BID) | ORAL | Status: DC

## 2014-03-28 ENCOUNTER — Ambulatory Visit (INDEPENDENT_AMBULATORY_CARE_PROVIDER_SITE_OTHER): Payer: Medicare Other | Admitting: Family Medicine

## 2014-03-28 ENCOUNTER — Encounter: Payer: Self-pay | Admitting: Family Medicine

## 2014-03-28 DIAGNOSIS — M5010 Cervical disc disorder with radiculopathy, unspecified cervical region: Secondary | ICD-10-CM | POA: Diagnosis not present

## 2014-03-28 MED ORDER — METHYLPREDNISOLONE (PAK) 4 MG PO TABS: ORAL_TABLET | ORAL | Status: DC

## 2014-03-28 MED ORDER — OXYCODONE-ACETAMINOPHEN 5-325 MG PO TABS
1.0000 | ORAL_TABLET | Freq: Four times a day (QID) | ORAL | Status: DC | PRN
Start: 2014-03-28 — End: 2014-04-23

## 2014-03-28 MED ORDER — ONDANSETRON 4 MG PO TBDP: 4.0000 mg | ORAL_TABLET | Freq: Three times a day (TID) | ORAL | Status: DC | PRN

## 2014-03-28 NOTE — Progress Notes (Signed)
   Subjective:    Patient ID: Teresa [name], female    DOB: 1955-07-15, 59 y.o.   MRN: [id number]  HPI Patient c/o neck pain that is radiating down her left arm and hand.  She has been having severe pain 8 on scale of 1-10.  Review of Systems  Constitutional: Negative for fever.  HENT: Negative for ear pain.   Eyes: Negative for discharge.  Respiratory: Negative for cough.   Cardiovascular: Negative for chest pain.  Gastrointestinal: Negative for abdominal distention.  Endocrine: Negative for polyuria.  Genitourinary: Negative for difficulty urinating.  Musculoskeletal: Negative for gait problem and neck pain.  Skin: Negative for color change and rash.  Neurological: Negative for speech difficulty and headaches.  Psychiatric/Behavioral: Negative for agitation.       Objective:    BP 133/81 mmHg  Pulse 107  Temp(Src) 96.1 F (35.6 C) (Oral)  Ht 5\' 3"  (1.6 m)  Wt 157 lb 6.4 oz (71.396 kg)  BMI 27.89 kg/m2 Physical Exam  Constitutional: She is oriented to person, place, and time. She appears well-developed and well-nourished.  HENT:  Head: Normocephalic and atraumatic.  Mouth/Throat: Oropharynx is clear and moist.  Eyes: Pupils are equal, round, and reactive to light.  Neck: Normal range of motion. Neck supple.  Cardiovascular: Normal rate and regular rhythm.   No murmur heard. Pulmonary/Chest: Effort normal and breath sounds normal.  Abdominal: Soft. Bowel sounds are normal. There is no tenderness.  Neurological: She is alert and oriented to person, place, and time.  Skin: Skin is warm and dry.  Psychiatric: She has a normal mood and affect.          Assessment & Plan:     ICD-9-CM ICD-10-CM   1. Cervical disc disorder with radiculopathy of cervical region 723.4 M50.10 oxyCODONE-acetaminophen (PERCOCET/ROXICET) 5-325 MG per tablet     methylPREDNIsolone (MEDROL DOSPACK) 4 MG tablet     ondansetron (ZOFRAN ODT) 4 MG disintegrating tablet     Return if  symptoms worsen or fail to improve.  Lysbeth Penner FNP

## 2014-04-22 DIAGNOSIS — R109 Unspecified abdominal pain: Secondary | ICD-10-CM | POA: Diagnosis not present

## 2014-04-22 DIAGNOSIS — K5792 Diverticulitis of intestine, part unspecified, without perforation or abscess without bleeding: Secondary | ICD-10-CM | POA: Diagnosis not present

## 2014-04-22 DIAGNOSIS — R350 Frequency of micturition: Secondary | ICD-10-CM | POA: Diagnosis not present

## 2014-04-23 ENCOUNTER — Encounter: Payer: Self-pay | Admitting: Family

## 2014-04-23 ENCOUNTER — Ambulatory Visit (INDEPENDENT_AMBULATORY_CARE_PROVIDER_SITE_OTHER): Payer: Medicare Other | Admitting: Family

## 2014-04-23 VITALS — BP 131/84 | HR 91 | Temp 97.1°F | Ht 63.0 in | Wt 158.0 lb

## 2014-04-23 DIAGNOSIS — M5010 Cervical disc disorder with radiculopathy, unspecified cervical region: Secondary | ICD-10-CM | POA: Diagnosis not present

## 2014-04-23 DIAGNOSIS — K5730 Diverticulosis of large intestine without perforation or abscess without bleeding: Secondary | ICD-10-CM | POA: Diagnosis not present

## 2014-04-23 DIAGNOSIS — R110 Nausea: Secondary | ICD-10-CM

## 2014-04-23 MED ORDER — ONDANSETRON HCL 4 MG PO TABS: 4.0000 mg | ORAL_TABLET | Freq: Three times a day (TID) | ORAL | Status: DC | PRN

## 2014-04-23 MED ORDER — OXYCODONE-ACETAMINOPHEN 5-325 MG PO TABS: 1.0000 | ORAL_TABLET | Freq: Four times a day (QID) | ORAL | Status: DC | PRN

## 2014-04-23 NOTE — Progress Notes (Signed)
Subjective:    Patient ID: Teresa [name], female    DOB: May 11, 1955, 59 y.o.   MRN: [id number]  Abdominal Pain This is a recurrent problem. The current episode started in the past 7 days. The onset quality is gradual. The problem occurs constantly. The problem has been gradually worsening. The pain is located in the LLQ. The pain is at a severity of 8/10. The pain is mild. The quality of the pain is cramping. The abdominal pain does not radiate. Associated symptoms include nausea. Pertinent negatives include no constipation, flatus, frequency, headaches, hematuria or vomiting. She has tried oral narcotic analgesics for the symptoms. The treatment provided mild relief.   *Pt was seen at the Urgent [facility] yesterday and given to two RX of antibiotics for her her diverticulitis. She was also given a rx of hydrocodone but pt states she get's "itchy" when she takes that and would like to have another rx for pain and nausea.    Review of Systems  Constitutional: Negative.   HENT: Negative.   Eyes: Negative.   Respiratory: Negative.  Negative for shortness of breath.   Cardiovascular: Negative.  Negative for palpitations.  Gastrointestinal: Positive for nausea and abdominal pain. Negative for vomiting, constipation and flatus.  Endocrine: Negative.   Genitourinary: Negative.  Negative for frequency and hematuria.  Musculoskeletal: Negative.   Neurological: Negative.  Negative for headaches.  Hematological: Negative.   Psychiatric/Behavioral: Negative.   All other systems reviewed and are negative.      Objective:   Physical Exam  Constitutional: She is oriented to person, place, and time. She appears well-developed and well-nourished. No distress.  HENT:  Head: Normocephalic and atraumatic.  Right Ear: External ear normal.  Left Ear: External ear normal.  Nose: Nose normal.  Mouth/Throat: Oropharynx is clear and moist.  Eyes: Pupils are equal, round, and reactive to light.  Neck:  Normal range of motion. Neck supple. No thyromegaly present.  Cardiovascular: Normal rate, regular rhythm, normal heart sounds and intact distal pulses.   No murmur heard. Pulmonary/Chest: Effort normal and breath sounds normal. No respiratory distress. She has no wheezes.  Abdominal: Soft. Bowel sounds are normal. She exhibits no distension. There is tenderness (LLQ).  Musculoskeletal: Normal range of motion. She exhibits no edema or tenderness.  Neurological: She is alert and oriented to person, place, and time. She has normal reflexes. No cranial nerve deficit.  Skin: Skin is warm and dry.  Psychiatric: She has a normal mood and affect. Her behavior is normal. Judgment and thought content normal.  Vitals reviewed.   BP 131/84 mmHg  Pulse 91  Temp(Src) 97.1 F (36.2 C) (Oral)  Ht 5\' 3"  (1.6 m)  Wt 158 lb (71.668 kg)  BMI 28.00 kg/m2       Assessment & Plan:  1. Diverticulosis of large intestine without hemorrhage -Finish antibiotics  that were prescribed -Increase fiber - oxyCODONE-acetaminophen (PERCOCET/ROXICET) 5-325 MG per tablet; Take 1 tablet by mouth every 6 (six) hours as needed for moderate pain or severe pain.  Dispense: 15 tablet; Refill: 0  2. Cervical disc disorder with radiculopathy of cervical region - oxyCODONE-acetaminophen (PERCOCET/ROXICET) 5-325 MG per tablet; Take 1 tablet by mouth every 6 (six) hours as needed for moderate pain or severe pain.  Dispense: 15 tablet; Refill: 0  3. Nausea without vomiting - ondansetron (ZOFRAN) 4 MG tablet; Take 1 tablet (4 mg total) by mouth every 8 (eight) hours as needed for nausea or vomiting.  Dispense: 30 tablet; Refill:  1  RTO prn  Evelina Dun, FNP

## 2014-04-23 NOTE — Patient Instructions (Signed)

## 2014-05-07 DIAGNOSIS — R252 Cramp and spasm: Secondary | ICD-10-CM | POA: Diagnosis not present

## 2014-05-07 DIAGNOSIS — G5600 Carpal tunnel syndrome, unspecified upper limb: Secondary | ICD-10-CM | POA: Diagnosis not present

## 2014-05-07 DIAGNOSIS — M25569 Pain in unspecified knee: Secondary | ICD-10-CM | POA: Diagnosis not present

## 2014-05-07 DIAGNOSIS — M545 Low back pain: Secondary | ICD-10-CM | POA: Diagnosis not present

## 2014-05-14 ENCOUNTER — Emergency Department (HOSPITAL_COMMUNITY)
Admission: EM | Admit: 2014-05-14 | Discharge: 2014-05-14 | Disposition: A | Discharge status: Home / Self Care | Payer: Medicare Other | Attending: Emergency Medicine | Admitting: Emergency Medicine

## 2014-05-14 ENCOUNTER — Encounter (HOSPITAL_COMMUNITY): Payer: Self-pay | Admitting: Emergency Medicine

## 2014-05-14 DIAGNOSIS — Z86010 Personal history of colonic polyps: Secondary | ICD-10-CM | POA: Insufficient documentation

## 2014-05-14 DIAGNOSIS — R1032 Left lower quadrant pain: Secondary | ICD-10-CM | POA: Insufficient documentation

## 2014-05-14 DIAGNOSIS — J45909 Unspecified asthma, uncomplicated: Secondary | ICD-10-CM | POA: Diagnosis not present

## 2014-05-14 DIAGNOSIS — G8929 Other chronic pain: Secondary | ICD-10-CM | POA: Diagnosis not present

## 2014-05-14 DIAGNOSIS — K219 Gastro-esophageal reflux disease without esophagitis: Secondary | ICD-10-CM | POA: Insufficient documentation

## 2014-05-14 DIAGNOSIS — F329 Major depressive disorder, single episode, unspecified: Secondary | ICD-10-CM | POA: Diagnosis not present

## 2014-05-14 DIAGNOSIS — Z8719 Personal history of other diseases of the digestive system: Secondary | ICD-10-CM

## 2014-05-14 DIAGNOSIS — F419 Anxiety disorder, unspecified: Secondary | ICD-10-CM | POA: Diagnosis not present

## 2014-05-14 DIAGNOSIS — Z79899 Other long term (current) drug therapy: Secondary | ICD-10-CM | POA: Insufficient documentation

## 2014-05-14 DIAGNOSIS — Z87891 Personal history of nicotine dependence: Secondary | ICD-10-CM | POA: Insufficient documentation

## 2014-05-14 DIAGNOSIS — M1990 Unspecified osteoarthritis, unspecified site: Secondary | ICD-10-CM | POA: Insufficient documentation

## 2014-05-14 DIAGNOSIS — Z8679 Personal history of other diseases of the circulatory system: Secondary | ICD-10-CM | POA: Diagnosis not present

## 2014-05-14 LAB — COMPREHENSIVE METABOLIC PANEL
ALK PHOS: 63 U/L (ref 39–117)
ALT: 28 U/L (ref 0–35)
AST: 29 U/L (ref 0–37)
Albumin: 3.8 g/dL (ref 3.5–5.2)
Anion gap: 7 (ref 5–15)
BUN: 8 mg/dL (ref 6–23)
CO2: 25 mmol/L (ref 19–32)
CREATININE: 0.77 mg/dL (ref 0.50–1.10)
Calcium: 8.7 mg/dL (ref 8.4–10.5)
Chloride: 107 mmol/L (ref 96–112)
GFR calc Af Amer: >90 mL/min (ref >90)
Glucose, Bld: 134 mg/dL — ABNORMAL HIGH (ref 70–99)
POTASSIUM: 3.6 mmol/L (ref 3.5–5.1)
Sodium: 139 mmol/L (ref 135–145)
Total Bilirubin: 1 mg/dL (ref 0.3–1.2)
Total Protein: 7.1 g/dL (ref 6.0–8.3)

## 2014-05-14 LAB — CBC WITH DIFFERENTIAL/PLATELET
BASOS ABS: 0 10*3/uL (ref 0.0–0.1)
Basophils Relative: 0 % (ref 0–1)
Eosinophils Absolute: 0.2 10*3/uL (ref 0.0–0.7)
Eosinophils Relative: 2 % (ref 0–5)
HCT: 40.9 % (ref 36.0–46.0)
Hemoglobin: 13.2 g/dL (ref 12.0–15.0)
Lymphocytes Relative: 31 % (ref 12–46)
Lymphs Abs: 2.2 10*3/uL (ref 0.7–4.0)
MCH: 26.6 pg (ref 26.0–34.0)
MCHC: 32.3 g/dL (ref 30.0–36.0)
MCV: 82.3 fL (ref 78.0–100.0)
Monocytes Absolute: 0.4 10*3/uL (ref 0.1–1.0)
Monocytes Relative: 6 % (ref 3–12)
NEUTROS ABS: 4.2 10*3/uL (ref 1.7–7.7)
NEUTROS PCT: 61 % (ref 43–77)
PLATELETS: 191 10*3/uL (ref 150–400)
RBC: 4.97 MIL/uL (ref 3.87–5.11)
RDW: 14.3 % (ref 11.5–15.5)
WBC: 7 10*3/uL (ref 4.0–10.5)

## 2014-05-14 LAB — LIPASE, BLOOD: Lipase: 31 U/L (ref 11–59)

## 2014-05-14 MED ORDER — OXYCODONE-ACETAMINOPHEN 5-325 MG PO TABS: 1.0000 | ORAL_TABLET | Freq: Four times a day (QID) | ORAL | Status: DC | PRN

## 2014-05-14 MED ORDER — CIPROFLOXACIN HCL 500 MG PO TABS: 500.0000 mg | ORAL_TABLET | Freq: Two times a day (BID) | ORAL | Status: DC

## 2014-05-14 MED ORDER — PROMETHAZINE HCL 25 MG PO TABS: 25.0000 mg | ORAL_TABLET | Freq: Four times a day (QID) | ORAL | Status: DC | PRN

## 2014-05-14 MED ORDER — METRONIDAZOLE 500 MG PO TABS: 500.0000 mg | ORAL_TABLET | Freq: Three times a day (TID) | ORAL | Status: DC

## 2014-05-14 NOTE — ED Notes (Signed)
Having lower abdominal pain for 2 days. Rates pain 8.  Have not taken any medication for pain today

## 2014-05-14 NOTE — ED Provider Notes (Signed)
CSN: [id number]     Arrival date & time 05/14/14  1345 History   First MD Initiated Contact with Patient 05/14/14 1616     Chief Complaint  Patient presents with  . Abdominal Pain     (Consider location/radiation/quality/duration/timing/severity/associated sxs/prior Treatment) HPI Comments: Patient is a 59 year old female with history of recurrent diverticulitis. She presents with complaints of left lower quadrant pain that feels like her prior episodes. She denies any fevers or chills. She denies any bloody stools. She denies any urinary complaints.  Patient is a 59 y.o. female presenting with abdominal pain. The history is provided by the patient.  Abdominal Pain Pain location:  LLQ Pain quality: cramping   Pain radiates to:  Does not radiate Pain severity:  Moderate Onset quality:  Gradual Duration:  2 days Timing:  Constant Progression:  Worsening Chronicity:  Recurrent Relieved by:  Nothing Worsened by:  Nothing tried Ineffective treatments:  None tried   Past Medical History  Diagnosis Date  . Diverticulosis 07-08-2005    colonoscopy  . Hiatal hernia 07-08-2005    EGD  . GERD (gastroesophageal reflux disease) 07-08-2005    EGD  . Irritable bowel syndrome   . Vocal cord polyp     history of  . Degeneration of intervertebral disc, site unspecified   . Osteoarthrosis, unspecified whether generalized or localized, lower leg   . Depressive disorder, not elsewhere classified   . Anxiety state, unspecified   . Full dentures   . Seizures 1990s    x 1, unknown cause; no seizures since  . Chronic back pain greater than 3 months duration   . Seasonal allergies   . Papilloma of breast 09/2011    left  . Asthma     prn inhaler  . Esophageal stricture 2014  . Insomnia   . Muscle spasm   . Migraine   . Diverticulitis   . Colon polyp    Past Surgical History  Procedure Laterality Date  . Cholecystectomy    . Bladder surgery      bladder tack  . Rectal tumor by  proctotomy excision    . Cystoscopy  08/24/2011    Procedure: CYSTOSCOPY;  Surgeon: Reece Packer, MD;  Location: [facility];  Service: Urology;  Laterality: N/A;  Cystoscopy,  Rectocele Repair and Vault Prolapse Repair  . Rectocele repair  08/24/2011    Procedure: POSTERIOR REPAIR (RECTOCELE);  Surgeon: Reece Packer, MD;  Location: [facility];  Service: Urology;  Laterality: N/A;  . Vaginal prolapse repair  08/24/2011    Procedure: VAGINAL VAULT SUSPENSION;  Surgeon: Reece Packer, MD;  Location: [facility];  Service: Urology;  Laterality: N/A;  . Abdominal hysterectomy      partial  . Bilateral salpingoophorectomy    . Other surgical history      exc. vocal cord polyp  . Breast biopsy  11/03/2011    Procedure: BREAST BIOPSY WITH NEEDLE LOCALIZATION;  Surgeon: Harl Bowie, MD;  Location: [facility];  Service: General;  Laterality: Left;  needle localized left breast biopsy  . Colonoscopy  07/08/2005    562.10  . Esophagogastroduodenoscopy  10/03/2012    multiple   . Tumor removal      benign  . Rectocelle repair     Family History  Problem Relation Age of Onset  . Prostate cancer Father   . Sleep apnea Father   . Stomach cancer Maternal Uncle   . Diabetes Maternal Grandmother   . Stroke  Maternal Grandmother   . Heart disease Paternal Grandmother   . Irritable bowel syndrome    . Stroke Maternal Grandfather    History  Substance Use Topics  . Smoking status: Former Smoker    Quit date: 08/20/1991  . Smokeless tobacco: Never Used  . Alcohol Use: No   OB History    No data available     Review of Systems  Gastrointestinal: Positive for abdominal pain.  All other systems reviewed and are negative.     Allergies  Trazodone and nefazodone; Arthrotec; Flonase; Keflex; Naproxen; Prozac; Vicodin; Ambien; and Gabapentin  Home Medications   Prior to Admission medications   Medication Sig Start Date End Date Taking? Authorizing Provider  albuterol  (PROAIR HFA) 108 (90 BASE) MCG/ACT inhaler Inhale 2 puffs into the lungs 4 (four) times daily as needed for wheezing or shortness of breath. 03/18/14  Yes Sharion Balloon, FNP  ALPRAZolam (XANAX) 0.25 MG tablet TAKE 1 TABLET AT BEDTIME AS NEEDED FOR ANXIETY Patient taking differently: Take 0.25 mg by mouth daily as needed for anxiety. TAKE 1 TABLET AT BEDTIME AS NEEDED FOR ANXIETY 03/18/14  Yes Sharion Balloon, FNP  budesonide-formoterol (SYMBICORT) 160-4.5 MCG/ACT inhaler Inhale 2 puffs into the lungs 2 (two) times daily. 03/18/14  Yes Sharion Balloon, FNP  cetirizine (ZYRTEC) 10 MG tablet Take 1 tablet (10 mg total) by mouth every morning. 03/18/14  Yes Sharion Balloon, FNP  cyclobenzaprine (FLEXERIL) 10 MG tablet Take 10 mg by mouth every 6 (six) hours as needed for muscle spasms.   Yes Historical Provider, MD  dexlansoprazole (DEXILANT) 60 MG capsule TAKE ONE (1) CAPSULE EACH DAY Patient taking differently: Take 60 mg by mouth daily. TAKE ONE (1) CAPSULE EACH DAY 03/18/14  Yes Sharion Balloon, FNP  docusate sodium (COLACE) 100 MG capsule Take 100 mg by mouth daily as needed for mild constipation.   Yes Historical Provider, MD  fluticasone (FLONASE) 50 MCG/ACT nasal spray Place 2 sprays into both nostrils daily. 12/12/13  Yes Orson Ape Oxford, FNP  MAGNESIUM PO Take 1 tablet by mouth daily.    Yes Historical Provider, MD  montelukast (SINGULAIR) 10 MG tablet Take 1 tablet (10 mg total) by mouth daily. 03/18/14  Yes Sharion Balloon, FNP  ondansetron (ZOFRAN) 4 MG tablet Take 1 tablet (4 mg total) by mouth every 8 (eight) hours as needed for nausea or vomiting. 04/23/14  Yes Sharion Balloon, FNP  oxyCODONE-acetaminophen (PERCOCET/ROXICET) 5-325 MG per tablet Take 1 tablet by mouth every 6 (six) hours as needed for moderate pain or severe pain. 04/23/14  Yes Sharion Balloon, FNP  traMADol (ULTRAM) 50 MG tablet Take 1 tablet (50 mg total) by mouth every 6 (six) hours as needed. 10/08/13  Yes Orson Ape Oxford,  FNP   BP 138/76 mmHg  Pulse 100  Temp(Src) 98.5 F (36.9 C) (Oral)  Resp 16  Ht 5\' 3"  (1.6 m)  Wt 160 lb (72.576 kg)  BMI 28.35 kg/m2  SpO2 96% Physical Exam  Constitutional: She is oriented to person, place, and time. She appears well-developed and well-nourished. No distress.  HENT:  Head: Normocephalic and atraumatic.  Neck: Normal range of motion. Neck supple.  Cardiovascular: Normal rate and regular rhythm.  Exam reveals no gallop and no friction rub.   No murmur heard. Pulmonary/Chest: Effort normal and breath sounds normal. No respiratory distress. She has no wheezes.  Abdominal: Soft. Bowel sounds are normal. She exhibits no distension.  There is  mild tenderness to palpation in the left lower quadrant with no rebound and no guarding.  Musculoskeletal: Normal range of motion.  Neurological: She is alert and oriented to person, place, and time.  Skin: Skin is warm and dry. She is not diaphoretic.  Nursing note and vitals reviewed.   ED Course  Procedures (including critical care time) Labs Review Labs Reviewed  COMPREHENSIVE METABOLIC PANEL - Abnormal; Notable for the following:    Glucose, Bld 134 (*)    All other components within normal limits  CBC WITH DIFFERENTIAL/PLATELET  LIPASE, BLOOD  URINALYSIS, ROUTINE W REFLEX MICROSCOPIC    Imaging Review No results found.   EKG Interpretation None      MDM   Final diagnoses:  None    Patient presents with recurrent left lower quadrant pain that appears to be related to diverticulitis. She has had multiple imaging studies in the past year and I do not feel as though repeating this is in the patient's dentures. Her laboratory studies are reassuring. She will be treated with Cipro, Flagyl, pain medication, and when necessary return.    Veryl Speak, MD 05/14/14 [id number]

## 2014-05-14 NOTE — Discharge Instructions (Signed)
Cipro and Flagyl as prescribed.  Percocet as prescribed as needed for pain.  Zofran as prescribed as needed for nausea.  Follow-up with your primary Dr. in the next week, and return to the ER if your symptoms substantially worsen or change.   Abdominal Pain Many things can cause abdominal pain. Usually, abdominal pain is not caused by a disease and will improve without treatment. It can often be observed and treated at home. Your health care provider will do a physical exam and possibly order blood tests and X-rays to help determine the seriousness of your pain. However, in many cases, more time must pass before a clear cause of the pain can be found. Before that point, your health care provider may not know if you need more testing or further treatment. HOME CARE INSTRUCTIONS  Monitor your abdominal pain for any changes. The following actions may help to alleviate any discomfort you are experiencing:  Only take over-the-counter or prescription medicines as directed by your health care provider.  Do not take laxatives unless directed to do so by your health care provider.  Try a clear liquid diet (broth, tea, or water) as directed by your health care provider. Slowly move to a bland diet as tolerated. SEEK MEDICAL CARE IF:  You have unexplained abdominal pain.  You have abdominal pain associated with nausea or diarrhea.  You have pain when you urinate or have a bowel movement.  You experience abdominal pain that wakes you in the night.  You have abdominal pain that is worsened or improved by eating food.  You have abdominal pain that is worsened with eating fatty foods.  You have a fever. SEEK IMMEDIATE MEDICAL CARE IF:   Your pain does not go away within 2 hours.  You keep throwing up (vomiting).  Your pain is felt only in portions of the abdomen, such as the right side or the left lower portion of the abdomen.  You pass bloody or black tarry stools. MAKE SURE  YOU:  Understand these instructions.   Will watch your condition.   Will get help right away if you are not doing well or get worse.  Document Released: 11/25/2004 Document Revised: 02/20/2013 Document Reviewed: 10/25/2012 [facility] Patient Information 2015 [place], [place]. This information is not intended to replace advice given to you by your health care provider. Make sure you discuss any questions you have with your health care provider.

## 2014-06-05 ENCOUNTER — Other Ambulatory Visit: Payer: Self-pay | Admitting: Family

## 2014-06-06 ENCOUNTER — Telehealth: Payer: Self-pay | Admitting: Family

## 2014-06-06 NOTE — Telephone Encounter (Signed)
Last seen 04/23/14 [facility]

## 2014-06-06 NOTE — Telephone Encounter (Signed)
Patient aware that we are full for tonight and we can schedule her for tomorrow.

## 2014-06-08 ENCOUNTER — Ambulatory Visit (INDEPENDENT_AMBULATORY_CARE_PROVIDER_SITE_OTHER): Payer: Medicare Other | Admitting: Family Medicine

## 2014-06-08 VITALS — BP 132/85 | HR 109 | Temp 96.7°F | Ht 63.0 in | Wt 158.0 lb

## 2014-06-08 DIAGNOSIS — K5732 Diverticulitis of large intestine without perforation or abscess without bleeding: Secondary | ICD-10-CM

## 2014-06-08 LAB — POCT CBC
Granulocyte percent: 66.7 %G (ref 37–80)
HEMATOCRIT: 44.7 % (ref 37.7–47.9)
HEMOGLOBIN: 13.7 g/dL (ref 12.2–16.2)
Lymph, poc: 2.1 (ref 0.6–3.4)
MCH: 25 pg — AB (ref 27–31.2)
MCHC: 30.6 g/dL — AB (ref 31.8–35.4)
MCV: 81.5 fL (ref 80–97)
MPV: 7.7 fL (ref 0–99.8)
PLATELET COUNT, POC: 214 10*3/uL (ref 142–424)
POC Granulocyte: 4.9 (ref 2–6.9)
POC LYMPH %: 28.9 % (ref 10–50)
RBC: 5.48 M/uL (ref 4.04–5.48)
RDW, POC: 14.9 %
WBC: 7.4 10*3/uL (ref 4.6–10.2)

## 2014-06-08 MED ORDER — LEVOFLOXACIN 750 MG PO TABS: 750.0000 mg | ORAL_TABLET | Freq: Every day | ORAL | Status: DC

## 2014-06-08 MED ORDER — METRONIDAZOLE 500 MG PO TABS: 500.0000 mg | ORAL_TABLET | Freq: Four times a day (QID) | ORAL | Status: DC

## 2014-06-08 MED ORDER — TRAMADOL HCL 50 MG PO TABS: 50.0000 mg | ORAL_TABLET | Freq: Four times a day (QID) | ORAL | Status: DC

## 2014-06-08 NOTE — Progress Notes (Signed)
Subjective:  Patient ID: Teresa [name], female    DOB: 1956-01-11  Age: 59 y.o. MRN: [id number]  CC: Diarrhea; Nausea; Bloated; Abdominal Pain; and Diverticulitis   Teresa [name] presents for Sx persist in spite of recent Abx finished 2 weeks ago. Diarrhea 3 BM a day, watery.No fever. LLQ pain 9/10 this week. Bowels have unusually strong smell. Denies melena, hematochezia, tenesmus. Had colonoscopy 4 mos ago with multiple polypectomies.  History Teresa [name] has a past medical history of Diverticulosis (07-08-2005); Hiatal hernia (07-08-2005); GERD (gastroesophageal reflux disease) (07-08-2005); Irritable bowel syndrome; Vocal cord polyp; Degeneration of intervertebral disc, site unspecified; Osteoarthrosis, unspecified whether generalized or localized, lower leg; Depressive disorder, not elsewhere classified; Anxiety state, unspecified; Full dentures; Seizures (1990s); Chronic back pain greater than 3 months duration; Seasonal allergies; Papilloma of breast (09/2011); Asthma; Esophageal stricture (2014); Insomnia; Muscle spasm; Migraine; Diverticulitis; and Colon polyp.   She has past surgical history that includes Cholecystectomy; Bladder surgery; Rectal tumor by proctotomy excision; Cystoscopy (08/24/2011); Rectocele repair (08/24/2011); Vaginal prolapse repair (08/24/2011); Abdominal hysterectomy; Bilateral salpingoophorectomy; Other surgical history; Breast biopsy (11/03/2011); Colonoscopy (07/08/2005); Esophagogastroduodenoscopy (10/03/2012); Tumor removal; and rectocelle repair.   Her family history includes Diabetes in her maternal grandmother; Heart disease in her paternal grandmother; Irritable bowel syndrome in an other family member; Prostate cancer in her father; Sleep apnea in her father; Stomach cancer in her maternal uncle; Stroke in her maternal grandfather and maternal grandmother.She reports that she quit smoking about 22 years ago. She has never used smokeless tobacco. She reports that she  does not drink alcohol or use illicit drugs.  Current Outpatient Prescriptions on File Prior to Visit  Medication Sig Dispense Refill  . albuterol (PROAIR HFA) 108 (90 BASE) MCG/ACT inhaler Inhale 2 puffs into the lungs 4 (four) times daily as needed for wheezing or shortness of breath. 1 Inhaler 2  . ALPRAZolam (XANAX) 0.25 MG tablet TAKE 1 TABLET AT BEDTIME AS NEEDED FOR ANXIETY (Patient taking differently: Take 0.25 mg by mouth daily as needed for anxiety. TAKE 1 TABLET AT BEDTIME AS NEEDED FOR ANXIETY) 30 tablet 3  . budesonide-formoterol (SYMBICORT) 160-4.5 MCG/ACT inhaler Inhale 2 puffs into the lungs 2 (two) times daily. 1 Inhaler 11  . cetirizine (ZYRTEC) 10 MG tablet Take 1 tablet (10 mg total) by mouth every morning. 90 tablet 3  . cyclobenzaprine (FLEXERIL) 10 MG tablet Take 10 mg by mouth every 6 (six) hours as needed for muscle spasms.    Marland Kitchen dexlansoprazole (DEXILANT) 60 MG capsule TAKE ONE (1) CAPSULE EACH DAY (Patient taking differently: Take 60 mg by mouth daily. TAKE ONE (1) CAPSULE EACH DAY) 90 capsule 3  . docusate sodium (COLACE) 100 MG capsule Take 100 mg by mouth daily as needed for mild constipation.    . fluticasone (FLONASE) 50 MCG/ACT nasal spray Place 2 sprays into both nostrils daily. 16 g 11  . MAGNESIUM PO Take 1 tablet by mouth daily.     . montelukast (SINGULAIR) 10 MG tablet Take 1 tablet (10 mg total) by mouth daily. 90 tablet 4  . ondansetron (ZOFRAN) 4 MG tablet TAKE 1 TABLET EVERY 8 HOURS AS NEEDED FOR NAUSEA AND VOMITING 60 tablet 1  . oxyCODONE-acetaminophen (PERCOCET) 5-325 MG per tablet Take 1-2 tablets by mouth every 6 (six) hours as needed. 15 tablet 0  . traMADol (ULTRAM) 50 MG tablet Take 1 tablet (50 mg total) by mouth every 6 (six) hours as needed. 30 tablet 3   Current Facility-Administered Medications  on File Prior to Visit  Medication Dose Route Frequency Provider Last Rate Last Dose  . methylPREDNISolone acetate (DEPO-MEDROL) injection 80 mg  80  mg Intramuscular Once Lysbeth Penner, FNP        ROS Review of Systems  Constitutional: Negative for fever, chills, diaphoresis, appetite change, fatigue and unexpected weight change.  HENT: Negative for congestion, ear pain, hearing loss, postnasal drip, rhinorrhea, sneezing, sore throat and trouble swallowing.   Eyes: Negative for pain.  Respiratory: Negative for cough, chest tightness and shortness of breath.   Cardiovascular: Negative for chest pain and palpitations.  Gastrointestinal: Positive for abdominal pain. Negative for constipation.  Genitourinary: Negative for dysuria, frequency and menstrual problem.  Musculoskeletal: Negative for joint swelling and arthralgias.  Skin: Negative for rash.  Neurological: Negative for dizziness, weakness, numbness and headaches.  Psychiatric/Behavioral: Negative for dysphoric mood and agitation.    Objective:  BP 132/85 mmHg  Pulse 109  Temp(Src) 96.7 F (35.9 C) (Oral)  Ht 5' 3"  (1.6 m)  Wt 158 lb (71.668 kg)  BMI 28.00 kg/m2  BP Readings from Last 3 Encounters:  06/08/14 132/85  05/14/14 130/84  04/23/14 131/84    Wt Readings from Last 3 Encounters:  06/08/14 158 lb (71.668 kg)  05/14/14 160 lb (72.576 kg)  04/23/14 158 lb (71.668 kg)     Physical Exam  Constitutional: She is oriented to person, place, and time. She appears well-developed and well-nourished. No distress.  HENT:  Head: Normocephalic and atraumatic.  Right Ear: External ear normal.  Left Ear: External ear normal.  Nose: Nose normal.  Mouth/Throat: Oropharynx is clear and moist.  Eyes: Conjunctivae and EOM are normal. Pupils are equal, round, and reactive to light.  Neck: Normal range of motion. Neck supple. No thyromegaly present.  Cardiovascular: Normal rate, regular rhythm and normal heart sounds.   No murmur heard. Pulmonary/Chest: Effort normal and breath sounds normal. No respiratory distress. She has no wheezes. She has no rales.  Abdominal:  Soft. Bowel sounds are normal. She exhibits no distension. There is tenderness (mild BLQ).  Lymphadenopathy:    She has no cervical adenopathy.  Neurological: She is alert and oriented to person, place, and time. She has normal reflexes.  Skin: Skin is warm and dry.  Psychiatric: She has a normal mood and affect. Her behavior is normal. Judgment and thought content normal.    Lab Results  Component Value Date   HGBA1C 5.6% 11/29/2013    Lab Results  Component Value Date   WBC 7.4 06/08/2014   HGB 13.7 06/08/2014   HCT 44.7 06/08/2014   PLT 191 05/14/2014   GLUCOSE 134* 05/14/2014   CHOL 124 03/18/2014   TRIG 71 03/18/2014   HDL 50 03/18/2014   LDLCALC 60 03/18/2014   ALT 28 05/14/2014   AST 29 05/14/2014   NA 139 05/14/2014   K 3.6 05/14/2014   CL 107 05/14/2014   CREATININE 0.77 05/14/2014   BUN 8 05/14/2014   CO2 25 05/14/2014   TSH 2.010 03/18/2014   INR 1.11 08/20/2011   HGBA1C 5.6% 11/29/2013    No results found.  Assessment & Plan:   Ajanay was seen today for diarrhea, nausea, bloated, abdominal pain and diverticulitis.  Diagnoses and all orders for this visit:  Diverticulitis of colon Orders: -     Cancel: CBC with Differential/Platelet -     CMP14+EGFR -     levofloxacin (LEVAQUIN) 750 MG tablet; Take 1 tablet (750 mg total) by mouth  daily. -     metroNIDAZOLE (FLAGYL) 500 MG tablet; Take 1 tablet (500 mg total) by mouth 4 (four) times daily. -     POCT CBC  Other orders -     traMADol (ULTRAM) 50 MG tablet; Take 1 tablet (50 mg total) by mouth 4 (four) times daily.   I have discontinued Teresa [name]'s ciprofloxacin, metroNIDAZOLE, and promethazine. I am also having her start on levofloxacin, metroNIDAZOLE, and traMADol. Additionally, I am having her maintain her MAGNESIUM PO, traMADol, fluticasone, docusate sodium, budesonide-formoterol, albuterol, ALPRAZolam, cetirizine, dexlansoprazole, montelukast, cyclobenzaprine, oxyCODONE-acetaminophen, and  ondansetron. We will continue to administer methylPREDNISolone acetate.  Meds ordered this encounter  Medications  . levofloxacin (LEVAQUIN) 750 MG tablet    Sig: Take 1 tablet (750 mg total) by mouth daily.    Dispense:  10 tablet    Refill:  0  . metroNIDAZOLE (FLAGYL) 500 MG tablet    Sig: Take 1 tablet (500 mg total) by mouth 4 (four) times daily.    Dispense:  40 tablet    Refill:  0  . traMADol (ULTRAM) 50 MG tablet    Sig: Take 1 tablet (50 mg total) by mouth 4 (four) times daily.    Dispense:  30 tablet    Refill:  1   Reviewed previous E.D visit & CT of abd of 12/15 showing diverticulosis  Follow-up: Return in about 6 weeks (around 07/20/2014).  Claretta Fraise, M.D.

## 2014-06-11 LAB — CMP14+EGFR
ALBUMIN: 4.5 g/dL (ref 3.5–5.5)
ALT: 21 IU/L (ref 0–32)
AST: 27 IU/L (ref 0–40)
Albumin/Globulin Ratio: 1.7 (ref 1.1–2.5)
Alkaline Phosphatase: 84 IU/L (ref 39–117)
BUN/Creatinine Ratio: 13 (ref 9–23)
BUN: 9 mg/dL (ref 6–24)
Bilirubin Total: 0.7 mg/dL (ref 0.0–1.2)
CO2: 24 mmol/L (ref 18–29)
Calcium: 9.8 mg/dL (ref 8.7–10.2)
Chloride: 101 mmol/L (ref 97–108)
Creatinine, Ser: 0.69 mg/dL (ref 0.57–1.00)
GFR, EST AFRICAN AMERICAN: 113 mL/min/{1.73_m2} (ref >59)
GFR, EST NON AFRICAN AMERICAN: 98 mL/min/{1.73_m2} (ref >59)
Globulin, Total: 2.7 g/dL (ref 1.5–4.5)
Glucose: 74 mg/dL (ref 65–99)
Potassium: 4.3 mmol/L (ref 3.5–5.2)
Sodium: 139 mmol/L (ref 134–144)
TOTAL PROTEIN: 7.2 g/dL (ref 6.0–8.5)

## 2014-06-28 DIAGNOSIS — Z87891 Personal history of nicotine dependence: Secondary | ICD-10-CM | POA: Diagnosis not present

## 2014-06-28 DIAGNOSIS — J441 Chronic obstructive pulmonary disease with (acute) exacerbation: Secondary | ICD-10-CM | POA: Diagnosis not present

## 2014-06-28 DIAGNOSIS — J440 Chronic obstructive pulmonary disease with acute lower respiratory infection: Secondary | ICD-10-CM | POA: Diagnosis not present

## 2014-06-28 DIAGNOSIS — J209 Acute bronchitis, unspecified: Secondary | ICD-10-CM | POA: Diagnosis not present

## 2014-06-28 DIAGNOSIS — Z79899 Other long term (current) drug therapy: Secondary | ICD-10-CM | POA: Diagnosis not present

## 2014-06-28 DIAGNOSIS — R0602 Shortness of breath: Secondary | ICD-10-CM | POA: Diagnosis not present

## 2014-06-28 DIAGNOSIS — J45909 Unspecified asthma, uncomplicated: Secondary | ICD-10-CM | POA: Diagnosis not present

## 2014-06-28 DIAGNOSIS — J329 Chronic sinusitis, unspecified: Secondary | ICD-10-CM | POA: Diagnosis not present

## 2014-07-01 ENCOUNTER — Ambulatory Visit (INDEPENDENT_AMBULATORY_CARE_PROVIDER_SITE_OTHER): Payer: Medicare Other | Admitting: Family

## 2014-07-01 ENCOUNTER — Encounter: Payer: Self-pay | Admitting: Family

## 2014-07-01 VITALS — BP 116/79 | HR 90 | Temp 97.0°F | Ht 63.0 in | Wt 153.6 lb

## 2014-07-01 DIAGNOSIS — J209 Acute bronchitis, unspecified: Secondary | ICD-10-CM | POA: Diagnosis not present

## 2014-07-01 DIAGNOSIS — J4540 Moderate persistent asthma, uncomplicated: Secondary | ICD-10-CM | POA: Diagnosis not present

## 2014-07-01 DIAGNOSIS — Z09 Encounter for follow-up examination after completed treatment for conditions other than malignant neoplasm: Secondary | ICD-10-CM | POA: Diagnosis not present

## 2014-07-01 MED ORDER — BENZONATATE 200 MG PO CAPS: 200.0000 mg | ORAL_CAPSULE | Freq: Three times a day (TID) | ORAL | Status: DC | PRN

## 2014-07-01 MED ORDER — HYDROCODONE-HOMATROPINE 5-1.5 MG/5ML PO SYRP
5.0000 mL | ORAL_SOLUTION | Freq: Three times a day (TID) | ORAL | Status: DC | PRN
Start: 2014-07-01 — End: 2014-08-29

## 2014-07-01 NOTE — Patient Instructions (Addendum)
Acute Bronchitis Bronchitis is inflammation of the airways that extend from the windpipe into the lungs (bronchi). The inflammation often causes mucus to develop. This leads to a cough, which is the most common symptom of bronchitis.  In acute bronchitis, the condition usually develops suddenly and goes away over time, usually in a couple weeks. Smoking, allergies, and asthma can make bronchitis worse. Repeated episodes of bronchitis may cause further lung problems.  CAUSES Acute bronchitis is most often caused by the same virus that causes a cold. The virus can spread from person to person (contagious) through coughing, sneezing, and touching contaminated objects. SIGNS AND SYMPTOMS   Cough.   Fever.   Coughing up mucus.   Body aches.   Chest congestion.   Chills.   Shortness of breath.   Sore throat.  DIAGNOSIS  Acute bronchitis is usually diagnosed through a physical exam. Your health care provider will also ask you questions about your medical history. Tests, such as chest X-rays, are sometimes done to rule out other conditions.  TREATMENT  Acute bronchitis usually goes away in a couple weeks. Oftentimes, no medical treatment is necessary. Medicines are sometimes given for relief of fever or cough. Antibiotic medicines are usually not needed but may be prescribed in certain situations. In some cases, an inhaler may be recommended to help reduce shortness of breath and control the cough. A cool mist vaporizer may also be used to help thin bronchial secretions and make it easier to clear the chest.  HOME CARE INSTRUCTIONS  Get plenty of rest.   Drink enough fluids to keep your urine clear or pale yellow (unless you have a medical condition that requires fluid restriction). Increasing fluids may help thin your respiratory secretions (sputum) and reduce chest congestion, and it will prevent dehydration.   Take medicines only as directed by your health care provider.  If  you were prescribed an antibiotic medicine, finish it all even if you start to feel better.  Avoid smoking and secondhand smoke. Exposure to cigarette smoke or irritating chemicals will make bronchitis worse. If you are a smoker, consider using nicotine gum or skin patches to help control withdrawal symptoms. Quitting smoking will help your lungs heal faster.   Reduce the chances of another bout of acute bronchitis by washing your hands frequently, avoiding people with cold symptoms, and trying not to touch your hands to your mouth, nose, or eyes.   Keep all follow-up visits as directed by your health care provider.  SEEK MEDICAL CARE IF: Your symptoms do not improve after 1 week of treatment.  SEEK IMMEDIATE MEDICAL CARE IF:  You develop an increased fever or chills.   You have chest pain.   You have severe shortness of breath.  You have bloody sputum.   You develop dehydration.  You faint or repeatedly feel like you are going to pass out.  You develop repeated vomiting.  You develop a severe headache. MAKE SURE YOU:   Understand these instructions.  Will watch your condition.  Will get help right away if you are not doing well or get worse. Document Released: 03/25/2004 Document Revised: 07/02/2013 Document Reviewed: 08/08/2012 [employer] Patient Information 2015 [employer]. This information is not intended to replace advice given to you by your health care provider. Make sure you discuss any questions you have with your health care provider.  - Take meds as prescribed - Use a cool mist humidifier  -Use saline nose sprays frequently -Saline irrigations of the nose can   be very helpful if done frequently.  * 4X daily for 1 week*  * Use of a nettie pot can be helpful with this. Follow directions with this* -Force fluids -For any cough or congestion  Use plain Mucinex- regular strength or max strength is fine   * Children- consult with Pharmacist for dosing -For  fever or aces or pains- take tylenol or ibuprofen appropriate for age and weight.  * for fevers greater than 101 orally you may alternate ibuprofen and tylenol every  3 hours. -Throat lozenges if help    Teresa Pitz, FNP  

## 2014-07-01 NOTE — Progress Notes (Signed)
Subjective:    Patient ID: Teresa [name], female    DOB: 22-Sep-1955, 59 y.o.   MRN: [id number]  HPI Pt presents to the office today for Hospital follow up. Pt went to the ED on 06/28/14 and was diagnosed with Acute Bronchitis and COPD exacerbation. Pt was started on Levaquin 500 mg daily for 7 days and prednisone dosepak. Pt states her breathing is doing better, but believes she needs a "breathing treatment". Pt states she is occasionally coughing up a little "grey yellowish mucus" at times with SOB at times. Pt denies any headache, palpitations, edema at this time.     Review of Systems  Constitutional: Negative.   HENT: Negative.   Eyes: Negative.   Respiratory: Negative.  Negative for shortness of breath.   Cardiovascular: Negative.  Negative for palpitations.  Gastrointestinal: Negative.   Endocrine: Negative.   Genitourinary: Negative.   Musculoskeletal: Negative.   Neurological: Negative.  Negative for headaches.  Hematological: Negative.   Psychiatric/Behavioral: Negative.   All other systems reviewed and are negative.      Objective:   Physical Exam  Constitutional: She is oriented to person, place, and time. She appears well-developed and well-nourished. No distress.  HENT:  Head: Normocephalic and atraumatic.  Right Ear: External ear normal.  Left Ear: External ear normal.  Nose: Nose normal.  Mouth/Throat: Oropharynx is clear and moist.  Eyes: Pupils are equal, round, and reactive to light.  Neck: Normal range of motion. Neck supple. No thyromegaly present.  Cardiovascular: Normal rate, regular rhythm, normal heart sounds and intact distal pulses.   No murmur heard. Pulmonary/Chest: Effort normal and breath sounds normal. No respiratory distress. She has no wheezes.  Abdominal: Soft. Bowel sounds are normal. She exhibits no distension. There is no tenderness.  Musculoskeletal: Normal range of motion. She exhibits no edema or tenderness.  Neurological: She is  alert and oriented to person, place, and time. She has normal reflexes. No cranial nerve deficit.  Skin: Skin is warm and dry.  Psychiatric: She has a normal mood and affect. Her behavior is normal. Judgment and thought content normal.  Vitals reviewed.   BP 116/79 mmHg  Pulse 90  Temp(Src) 97 F (36.1 C) (Oral)  Ht 5\' 3"  (1.6 m)  Wt 153 lb 9.6 oz (69.673 kg)  BMI 27.22 kg/m2  SpO2 98%       Assessment & Plan:  1. Asthma, chronic, moderate persistent, uncomplicated  2. Hospital discharge follow-up -Continue medications  3. Acute bronchitis, unspecified organism  Take meds as prescribed - Use a cool mist humidifier  -Use saline nose sprays frequently -Saline irrigations of the nose can be very helpful if done frequently.  * 4X daily for 1 week*  * Use of a nettie pot can be helpful with this. Follow directions with this* -Force fluids -For any cough or congestion  Use plain Mucinex- regular strength or max strength is fine   * Children- consult with Pharmacist for dosing -For fever or aces or pains- take tylenol or ibuprofen appropriate for age and weight.  * for fevers greater than 101 orally you may alternate ibuprofen and tylenol every  3 hours. -Throat lozenges if help  benzonatate (TESSALON) 200 MG capsule; Take 1 capsule (200 mg total) by mouth 3 (three) times daily as needed.  Dispense: 30 capsule; Refill: 1 - HYDROcodone-homatropine (HYCODAN) 5-1.5 MG/5ML syrup; Take 5 mLs by mouth every 8 (eight) hours as needed for cough.  Dispense: 120 mL; Refill: 0  Evelina Dun, FNP

## 2014-07-06 ENCOUNTER — Telehealth: Payer: Self-pay | Admitting: *Deleted

## 2014-07-06 MED ORDER — NYSTATIN 100000 UNIT/ML MT SUSP: 5.0000 mL | Freq: Four times a day (QID) | OROMUCOSAL | Status: DC

## 2014-07-06 NOTE — Telephone Encounter (Signed)
Pt states she has thrush from some antibiotics given at ER for her cold/cough. [facility] [place]

## 2014-07-06 NOTE — Telephone Encounter (Signed)
Mycostatin oral suspension 200 mL swish and swallow 1 teaspoon after meals and at bedtime until completed

## 2014-07-11 ENCOUNTER — Telehealth: Payer: Self-pay | Admitting: Family

## 2014-07-12 ENCOUNTER — Ambulatory Visit (INDEPENDENT_AMBULATORY_CARE_PROVIDER_SITE_OTHER): Payer: Medicare Other | Admitting: Physician Assistant

## 2014-07-12 ENCOUNTER — Encounter: Payer: Self-pay | Admitting: Physician Assistant

## 2014-07-12 VITALS — BP 129/87 | HR 99 | Temp 97.2°F | Ht 63.0 in | Wt 150.2 lb

## 2014-07-12 DIAGNOSIS — J302 Other seasonal allergic rhinitis: Secondary | ICD-10-CM | POA: Diagnosis not present

## 2014-07-12 NOTE — Progress Notes (Signed)
   Subjective:    Patient ID: Teresa [name], female    DOB: 07/05/1955, 59 y.o.   MRN: [id number]  HPI 59 y/o female with chronic bronchitis, past smoker, presents with c/o thick green mucus and cough x 2 weeks. Has tried hycodan, mucinex, and tessalon with some relief. States that the Hycodan "doesn't do much". Has also been using inhalers, Symbicort and albuterol as directed.     Review of Systems  Constitutional: Positive for chills.  HENT: Positive for congestion (chest ), sneezing and sore throat (intermittent).   Respiratory: Positive for cough (productive worse at night), shortness of breath and wheezing.   Cardiovascular: Negative.   All other systems reviewed and are negative.      Objective:   Physical Exam  Constitutional: She is oriented to person, place, and time. She appears well-developed and well-nourished.  Dry cough during exam    HENT:  Inflamed, boggy nasal turbinates bilaterally   Cardiovascular: Normal rate, regular rhythm and normal heart sounds.   Pulmonary/Chest: Effort normal and breath sounds normal. No respiratory distress. She has no wheezes. She has no rales. She exhibits no tenderness.  Pulse ox 97%  Neurological: She is alert and oriented to person, place, and time. She has normal reflexes.  Nursing note and vitals reviewed.         Assessment & Plan:  1. Other seasonal allergic rhinitis - Take zyrtec 10mg  on a regular basis - Use Flonase nasal spray daily as directed in addition to saline nasal spray - avoid allergens   2. Cough  - albuterol as needed    F/U prn   Euan Wandler A. Benjamin Stain PA-C

## 2014-07-12 NOTE — Patient Instructions (Signed)

## 2014-07-12 NOTE — Telephone Encounter (Signed)
Appointment given for 11:30 today with Evelina Dun, FNP.

## 2014-07-16 ENCOUNTER — Other Ambulatory Visit: Payer: Self-pay | Admitting: Family

## 2014-08-07 ENCOUNTER — Emergency Department (HOSPITAL_COMMUNITY)
Admission: EM | Admit: 2014-08-07 | Discharge: 2014-08-07 | Disposition: A | Discharge status: Home / Self Care | Payer: No Typology Code available for payment source | Attending: Emergency Medicine | Admitting: Emergency Medicine

## 2014-08-07 ENCOUNTER — Emergency Department (HOSPITAL_COMMUNITY): Payer: No Typology Code available for payment source

## 2014-08-07 ENCOUNTER — Encounter (HOSPITAL_COMMUNITY): Payer: Self-pay | Admitting: Emergency Medicine

## 2014-08-07 DIAGNOSIS — S79911A Unspecified injury of right hip, initial encounter: Secondary | ICD-10-CM | POA: Insufficient documentation

## 2014-08-07 DIAGNOSIS — Z86018 Personal history of other benign neoplasm: Secondary | ICD-10-CM | POA: Diagnosis not present

## 2014-08-07 DIAGNOSIS — F419 Anxiety disorder, unspecified: Secondary | ICD-10-CM | POA: Diagnosis not present

## 2014-08-07 DIAGNOSIS — F329 Major depressive disorder, single episode, unspecified: Secondary | ICD-10-CM | POA: Insufficient documentation

## 2014-08-07 DIAGNOSIS — G8929 Other chronic pain: Secondary | ICD-10-CM | POA: Diagnosis not present

## 2014-08-07 DIAGNOSIS — S3991XA Unspecified injury of abdomen, initial encounter: Secondary | ICD-10-CM | POA: Diagnosis not present

## 2014-08-07 DIAGNOSIS — S3992XA Unspecified injury of lower back, initial encounter: Secondary | ICD-10-CM | POA: Insufficient documentation

## 2014-08-07 DIAGNOSIS — Z86010 Personal history of colonic polyps: Secondary | ICD-10-CM | POA: Insufficient documentation

## 2014-08-07 DIAGNOSIS — Y998 Other external cause status: Secondary | ICD-10-CM | POA: Insufficient documentation

## 2014-08-07 DIAGNOSIS — S199XXA Unspecified injury of neck, initial encounter: Secondary | ICD-10-CM | POA: Insufficient documentation

## 2014-08-07 DIAGNOSIS — S299XXA Unspecified injury of thorax, initial encounter: Secondary | ICD-10-CM | POA: Diagnosis not present

## 2014-08-07 DIAGNOSIS — Y9389 Activity, other specified: Secondary | ICD-10-CM | POA: Insufficient documentation

## 2014-08-07 DIAGNOSIS — Z79899 Other long term (current) drug therapy: Secondary | ICD-10-CM | POA: Insufficient documentation

## 2014-08-07 DIAGNOSIS — K219 Gastro-esophageal reflux disease without esophagitis: Secondary | ICD-10-CM | POA: Insufficient documentation

## 2014-08-07 DIAGNOSIS — J45909 Unspecified asthma, uncomplicated: Secondary | ICD-10-CM | POA: Insufficient documentation

## 2014-08-07 DIAGNOSIS — M542 Cervicalgia: Secondary | ICD-10-CM | POA: Diagnosis not present

## 2014-08-07 DIAGNOSIS — R1084 Generalized abdominal pain: Secondary | ICD-10-CM | POA: Diagnosis not present

## 2014-08-07 DIAGNOSIS — Z7951 Long term (current) use of inhaled steroids: Secondary | ICD-10-CM | POA: Insufficient documentation

## 2014-08-07 DIAGNOSIS — M1990 Unspecified osteoarthritis, unspecified site: Secondary | ICD-10-CM | POA: Insufficient documentation

## 2014-08-07 DIAGNOSIS — Z87891 Personal history of nicotine dependence: Secondary | ICD-10-CM | POA: Diagnosis not present

## 2014-08-07 DIAGNOSIS — M25551 Pain in right hip: Secondary | ICD-10-CM | POA: Diagnosis not present

## 2014-08-07 DIAGNOSIS — G43909 Migraine, unspecified, not intractable, without status migrainosus: Secondary | ICD-10-CM | POA: Diagnosis not present

## 2014-08-07 DIAGNOSIS — Y92410 Unspecified street and highway as the place of occurrence of the external cause: Secondary | ICD-10-CM | POA: Insufficient documentation

## 2014-08-07 DIAGNOSIS — R079 Chest pain, unspecified: Secondary | ICD-10-CM | POA: Diagnosis not present

## 2014-08-07 LAB — CBC WITH DIFFERENTIAL/PLATELET
Basophils Absolute: 0 10*3/uL (ref 0.0–0.1)
Basophils Relative: 0 % (ref 0–1)
Eosinophils Absolute: 0.1 10*3/uL (ref 0.0–0.7)
Eosinophils Relative: 1 % (ref 0–5)
HCT: 43.1 % (ref 36.0–46.0)
Hemoglobin: 14.4 g/dL (ref 12.0–15.0)
LYMPHS PCT: 20 % (ref 12–46)
Lymphs Abs: 1.5 10*3/uL (ref 0.7–4.0)
MCH: 27.4 pg (ref 26.0–34.0)
MCHC: 33.4 g/dL (ref 30.0–36.0)
MCV: 81.9 fL (ref 78.0–100.0)
Monocytes Absolute: 0.5 10*3/uL (ref 0.1–1.0)
Monocytes Relative: 6 % (ref 3–12)
NEUTROS ABS: 5.6 10*3/uL (ref 1.7–7.7)
Neutrophils Relative %: 73 % (ref 43–77)
Platelets: 181 10*3/uL (ref 150–400)
RBC: 5.26 MIL/uL — ABNORMAL HIGH (ref 3.87–5.11)
RDW: 14.2 % (ref 11.5–15.5)
WBC: 7.7 10*3/uL (ref 4.0–10.5)

## 2014-08-07 LAB — URINALYSIS, ROUTINE W REFLEX MICROSCOPIC
Bilirubin Urine: NEGATIVE
Glucose, UA: NEGATIVE mg/dL
Hgb urine dipstick: NEGATIVE
KETONES UR: NEGATIVE mg/dL
LEUKOCYTES UA: NEGATIVE
NITRITE: NEGATIVE
PH: 5.5 (ref 5.0–8.0)
PROTEIN: NEGATIVE mg/dL
Specific Gravity, Urine: <1.005 — ABNORMAL LOW (ref 1.005–1.030)
Urobilinogen, UA: 0.2 mg/dL (ref 0.0–1.0)

## 2014-08-07 LAB — COMPREHENSIVE METABOLIC PANEL
ALBUMIN: 3.8 g/dL (ref 3.5–5.0)
ALK PHOS: 72 U/L (ref 38–126)
ALT: 28 U/L (ref 14–54)
AST: 28 U/L (ref 15–41)
Anion gap: 10 (ref 5–15)
BILIRUBIN TOTAL: 0.8 mg/dL (ref 0.3–1.2)
BUN: 6 mg/dL (ref 6–20)
CO2: 26 mmol/L (ref 22–32)
Calcium: 8.7 mg/dL — ABNORMAL LOW (ref 8.9–10.3)
Chloride: 106 mmol/L (ref 101–111)
Creatinine, Ser: 0.68 mg/dL (ref 0.44–1.00)
GFR calc Af Amer: >60 mL/min (ref >60)
Glucose, Bld: 119 mg/dL — ABNORMAL HIGH (ref 65–99)
Potassium: 3.6 mmol/L (ref 3.5–5.1)
SODIUM: 142 mmol/L (ref 135–145)
Total Protein: 7.1 g/dL (ref 6.5–8.1)

## 2014-08-07 LAB — TROPONIN I: Troponin I: <0.03 ng/mL (ref <0.031)

## 2014-08-07 MED ORDER — IOHEXOL 300 MG/ML  SOLN
100.0000 mL | Freq: Once | INTRAMUSCULAR | Status: AC | PRN
  Administered 2014-08-07: 100 mL via INTRAVENOUS

## 2014-08-07 MED ORDER — ONDANSETRON HCL 4 MG/2ML IJ SOLN
4.0000 mg | Freq: Once | INTRAMUSCULAR | Status: AC
  Administered 2014-08-07: 4 mg via INTRAVENOUS
  Filled 2014-08-07: qty 2

## 2014-08-07 MED ORDER — TRAMADOL HCL 50 MG PO TABS: 50.0000 mg | ORAL_TABLET | Freq: Four times a day (QID) | ORAL | Status: DC | PRN

## 2014-08-07 MED ORDER — SODIUM CHLORIDE 0.9 % IV SOLN
INTRAVENOUS | Status: DC
  Administered 2014-08-07: 20:00:00 via INTRAVENOUS

## 2014-08-07 MED ORDER — MORPHINE SULFATE 4 MG/ML IJ SOLN
4.0000 mg | Freq: Once | INTRAMUSCULAR | Status: AC
  Administered 2014-08-07: 4 mg via INTRAVENOUS
  Filled 2014-08-07: qty 1
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
## 2014-08-07 NOTE — ED Notes (Signed)
MD (Ward) at bedside. 

## 2014-08-07 NOTE — Discharge Instructions (Signed)
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
## 2014-08-09 ENCOUNTER — Ambulatory Visit (INDEPENDENT_AMBULATORY_CARE_PROVIDER_SITE_OTHER): Payer: Medicare Other | Admitting: Physician Assistant

## 2014-08-09 ENCOUNTER — Encounter: Payer: Self-pay | Admitting: Physician Assistant

## 2014-08-09 VITALS — BP 120/70 | HR 95 | Temp 97.3°F | Ht 63.0 in | Wt 151.0 lb

## 2014-08-09 DIAGNOSIS — M791 Myalgia, unspecified site: Secondary | ICD-10-CM

## 2014-08-09 DIAGNOSIS — R110 Nausea: Secondary | ICD-10-CM | POA: Diagnosis not present

## 2014-08-09 MED ORDER — ONDANSETRON HCL 4 MG PO TABS: 4.0000 mg | ORAL_TABLET | Freq: Three times a day (TID) | ORAL | Status: DC | PRN
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
## 2014-08-13 ENCOUNTER — Other Ambulatory Visit: Payer: Self-pay | Admitting: *Deleted

## 2014-08-13 MED ORDER — TRAMADOL HCL 50 MG PO TABS: 50.0000 mg | ORAL_TABLET | Freq: Four times a day (QID) | ORAL | Status: DC | PRN

## 2014-08-13 NOTE — Telephone Encounter (Signed)
LMOVM that written Rx is at front desk ready for pickup
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
## 2014-08-13 NOTE — Telephone Encounter (Signed)
Prescription ready for pick up.

## 2014-08-29 ENCOUNTER — Ambulatory Visit (INDEPENDENT_AMBULATORY_CARE_PROVIDER_SITE_OTHER): Payer: Medicare Other | Admitting: Family Medicine

## 2014-08-29 ENCOUNTER — Ambulatory Visit: Payer: Medicare Other | Admitting: Family Medicine

## 2014-08-29 ENCOUNTER — Encounter: Payer: Self-pay | Admitting: Family Medicine

## 2014-08-29 VITALS — BP 119/75 | HR 104 | Temp 97.0°F | Ht 63.0 in | Wt 150.4 lb

## 2014-08-29 DIAGNOSIS — W57XXXA Bitten or stung by nonvenomous insect and other nonvenomous arthropods, initial encounter: Secondary | ICD-10-CM | POA: Diagnosis not present

## 2014-08-29 DIAGNOSIS — T148 Other injury of unspecified body region: Secondary | ICD-10-CM

## 2014-08-29 MED ORDER — FLUOCINONIDE-E 0.05 % EX CREA: 1.0000 "application " | TOPICAL_CREAM | Freq: Two times a day (BID) | CUTANEOUS | Status: DC

## 2014-08-29 MED ORDER — ZOLPIDEM TARTRATE 5 MG PO TABS: 5.0000 mg | ORAL_TABLET | Freq: Every evening | ORAL | Status: DC | PRN
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
## 2014-08-30 ENCOUNTER — Telehealth: Payer: Self-pay | Admitting: Family Medicine

## 2014-08-30 ENCOUNTER — Other Ambulatory Visit: Payer: Self-pay | Admitting: Nurse Practitioner

## 2014-08-30 NOTE — Telephone Encounter (Signed)
Spoke with [facility] and they didn't fill one for her. Called in prescription.  Patient notified.

## 2014-08-30 NOTE — Telephone Encounter (Signed)
Please advise  Says it printed

## 2014-09-04 ENCOUNTER — Ambulatory Visit: Payer: Medicare Other | Admitting: Family

## 2014-09-05 ENCOUNTER — Ambulatory Visit (INDEPENDENT_AMBULATORY_CARE_PROVIDER_SITE_OTHER): Payer: Commercial Managed Care - HMO | Admitting: Physician Assistant

## 2014-09-05 ENCOUNTER — Ambulatory Visit: Payer: Medicare Other | Admitting: Physician Assistant

## 2014-09-05 ENCOUNTER — Encounter: Payer: Self-pay | Admitting: Physician Assistant

## 2014-09-05 VITALS — BP 123/72 | HR 120 | Temp 97.5°F | Ht 63.0 in | Wt 151.0 lb

## 2014-09-05 DIAGNOSIS — M545 Low back pain, unspecified: Secondary | ICD-10-CM

## 2014-09-05 DIAGNOSIS — R109 Unspecified abdominal pain: Secondary | ICD-10-CM

## 2014-09-05 LAB — POCT UA - MICROSCOPIC ONLY
Bacteria, U Microscopic: NEGATIVE
CRYSTALS, UR, HPF, POC: NEGATIVE
Casts, Ur, LPF, POC: NEGATIVE
Mucus, UA: NEGATIVE
RBC, urine, microscopic: NEGATIVE
WBC, Ur, HPF, POC: NEGATIVE
Yeast, UA: NEGATIVE

## 2014-09-05 LAB — POCT URINALYSIS DIPSTICK
Bilirubin, UA: NEGATIVE
GLUCOSE UA: NEGATIVE
KETONES UA: NEGATIVE
Leukocytes, UA: NEGATIVE
Nitrite, UA: NEGATIVE
Protein, UA: NEGATIVE
RBC UA: NEGATIVE
SPEC GRAV UA: 1.01
UROBILINOGEN UA: NEGATIVE
pH, UA: 6

## 2014-09-05 MED ORDER — MELOXICAM 7.5 MG PO TABS: 7.5000 mg | ORAL_TABLET | Freq: Every day | ORAL | Status: DC

## 2014-09-05 NOTE — Progress Notes (Signed)
   Subjective:    Patient ID: Teresa [name], female    DOB: 1955-11-15, 59 y.o.   MRN: [id number]  HPI 59 y/o female presents with c/o back pain after excessive cleaning 2 days ago. Pain is worse with movement, no relieving factors. She has tried ibuprofen, flexeril, tramadol with no relief.    Review of Systems  Genitourinary: Positive for urgency. Negative for dysuria, frequency and hematuria.       Stronger smell of urination   Musculoskeletal: Positive for back pain (bilateral low back pain but more on right ).       Objective:   Physical Exam  Constitutional: She is oriented to person, place, and time. She appears well-developed and well-nourished.  Musculoskeletal: She exhibits tenderness (right lumbar and thoracic region. No ttp over bony prominances. ).  Neurological: She is alert and oriented to person, place, and time.  Psychiatric: She has a normal mood and affect. Her behavior is normal. Judgment and thought content normal.  Nursing note and vitals reviewed.  Results for orders placed or performed in visit on 09/05/14  POCT urinalysis dipstick  Result Value Ref Range   Color, UA yellow    Clarity, UA clear    Glucose, UA neg    Bilirubin, UA neg    Ketones, UA neg    Spec Grav, UA 1.010    Blood, UA neg    pH, UA 6.0    Protein, UA neg    Urobilinogen, UA negative    Nitrite, UA neg    Leukocytes, UA Negative Negative  POCT UA - Microscopic Only  Result Value Ref Range   WBC, Ur, HPF, POC neg    RBC, urine, microscopic neg    Bacteria, U Microscopic neg    Mucus, UA neg    Epithelial cells, urine per micros occ    Crystals, Ur, HPF, POC neg    Casts, Ur, LPF, POC neg    Yeast, UA neg            Assessment & Plan:  1. Flank pain  - POCT urinalysis dipstick - POCT UA - Microscopic Only  2. Bilateral low back pain without sciatica - Rest, ice on affected area, alternate with heat  - meloxicam (MOBIC) 7.5 MG tablet; Take 1 tablet (7.5 mg total)  by mouth daily.  Dispense: 30 tablet; Refill: 0   Continue all meds Labs pending Health Maintenance reviewed Diet and exercise encouraged RTO prn   Dorothee Napierkowski A. Benjamin Stain PA-C

## 2014-09-05 NOTE — Patient Instructions (Signed)
Back Pain, Adult °Back pain is very common. The pain often gets better over time. The cause of back pain is usually not dangerous. Most people can learn to manage their back pain on their own.  °HOME CARE  °· Stay active. Start with short walks on flat ground if you can. Try to walk farther each day. °· Do not sit, drive, or stand in one place for more than 30 minutes. Do not stay in bed. °· Do not avoid exercise or work. Activity can help your back heal faster. °· Be careful when you bend or lift an object. Bend at your knees, keep the object close to you, and do not twist. °· Sleep on a firm mattress. Lie on your side, and bend your knees. If you lie on your back, put a pillow under your knees. °· Only take medicines as told by your doctor. °· Put ice on the injured area. °¨ Put ice in a plastic bag. °¨ Place a towel between your skin and the bag. °¨ Leave the ice on for 15-20 minutes, 03-04 times a day for the first 2 to 3 days. After that, you can switch between ice and heat packs. °· Ask your doctor about back exercises or massage. °· Avoid feeling anxious or stressed. Find good ways to deal with stress, such as exercise. °GET HELP RIGHT AWAY IF:  °· Your pain does not go away with rest or medicine. °· Your pain does not go away in 1 week. °· You have new problems. °· You do not feel well. °· The pain spreads into your legs. °· You cannot control when you poop (bowel movement) or pee (urinate). °· Your arms or legs feel weak or lose feeling (numbness). °· You feel sick to your stomach (nauseous) or throw up (vomit). °· You have belly (abdominal) pain. °· You feel like you may pass out (faint). °MAKE SURE YOU:  °· Understand these instructions. °· Will watch your condition. °· Will get help right away if you are not doing well or get worse. °Document Released: 08/04/2007 Document Revised: 05/10/2011 Document Reviewed: 06/19/2013 °ExitCare® Patient Information ©2015 [employer]. This information is not intended  to replace advice given to you by your health care provider. Make sure you discuss any questions you have with your health care provider. ° °

## 2014-09-17 ENCOUNTER — Encounter: Payer: Self-pay | Admitting: Physician Assistant

## 2014-09-17 ENCOUNTER — Ambulatory Visit (INDEPENDENT_AMBULATORY_CARE_PROVIDER_SITE_OTHER): Payer: Commercial Managed Care - HMO | Admitting: Physician Assistant

## 2014-09-17 VITALS — BP 132/79 | HR 108 | Temp 97.8°F | Ht 63.0 in | Wt 151.2 lb

## 2014-09-17 DIAGNOSIS — J069 Acute upper respiratory infection, unspecified: Secondary | ICD-10-CM

## 2014-09-17 DIAGNOSIS — R300 Dysuria: Secondary | ICD-10-CM

## 2014-09-17 LAB — POCT URINALYSIS DIPSTICK
Bilirubin, UA: NEGATIVE
Blood, UA: NEGATIVE
GLUCOSE UA: NEGATIVE
Ketones, UA: NEGATIVE
LEUKOCYTES UA: NEGATIVE
Nitrite, UA: NEGATIVE
PH UA: 7
Protein, UA: NEGATIVE
Spec Grav, UA: 1.01
UROBILINOGEN UA: NEGATIVE

## 2014-09-17 LAB — POCT UA - MICROSCOPIC ONLY
BACTERIA, U MICROSCOPIC: NEGATIVE
Casts, Ur, LPF, POC: NEGATIVE
Crystals, Ur, HPF, POC: NEGATIVE
Mucus, UA: NEGATIVE
RBC, urine, microscopic: NEGATIVE
WBC, Ur, HPF, POC: NEGATIVE
Yeast, UA: NEGATIVE

## 2014-09-17 NOTE — Patient Instructions (Signed)
Upper Respiratory Infection, Adult An upper respiratory infection (URI) is also sometimes known as the common cold. The upper respiratory tract includes the nose, sinuses, throat, trachea, and bronchi. Bronchi are the airways leading to the lungs. Most people improve within 1 week, but symptoms can last up to 2 weeks. A residual cough may last even longer.  CAUSES Many different viruses can infect the tissues lining the upper respiratory tract. The tissues become irritated and inflamed and often become very moist. Mucus production is also common. A cold is contagious. You can easily spread the virus to others by oral contact. This includes kissing, sharing a glass, coughing, or sneezing. Touching your mouth or nose and then touching a surface, which is then touched by another person, can also spread the virus. SYMPTOMS  Symptoms typically develop 1 to 3 days after you come in contact with a cold virus. Symptoms vary from person to person. They may include:  Runny nose.  Sneezing.  Nasal congestion.  Sinus irritation.  Sore throat.  Loss of voice (laryngitis).  Cough.  Fatigue.  Muscle aches.  Loss of appetite.  Headache.  Low-grade fever. DIAGNOSIS  You might diagnose your own cold based on familiar symptoms, since most people get a cold 2 to 3 times a year. Your caregiver can confirm this based on your exam. Most importantly, your caregiver can check that your symptoms are not due to another disease such as strep throat, sinusitis, pneumonia, asthma, or epiglottitis. Blood tests, throat tests, and X-rays are not necessary to diagnose a common cold, but they may sometimes be helpful in excluding other more serious diseases. Your caregiver will decide if any further tests are required. RISKS AND COMPLICATIONS  You may be at risk for a more severe case of the common cold if you smoke cigarettes, have chronic heart disease (such as heart failure) or lung disease (such as asthma), or if  you have a weakened immune system. The very young and very old are also at risk for more serious infections. Bacterial sinusitis, middle ear infections, and bacterial pneumonia can complicate the common cold. The common cold can worsen asthma and chronic obstructive pulmonary disease (COPD). Sometimes, these complications can require emergency medical care and may be life-threatening. PREVENTION  The best way to protect against getting a cold is to practice good hygiene. Avoid oral or hand contact with people with cold symptoms. Wash your hands often if contact occurs. There is no clear evidence that vitamin C, vitamin E, echinacea, or exercise reduces the chance of developing a cold. However, it is always recommended to get plenty of rest and practice good nutrition. TREATMENT  Treatment is directed at relieving symptoms. There is no cure. Antibiotics are not effective, because the infection is caused by a virus, not by bacteria. Treatment may include:  Increased fluid intake. Sports drinks offer valuable electrolytes, sugars, and fluids.  Breathing heated mist or steam (vaporizer or shower).  Eating chicken soup or other clear broths, and maintaining good nutrition.  Getting plenty of rest.  Using gargles or lozenges for comfort.  Controlling fevers with ibuprofen or acetaminophen as directed by your caregiver.  Increasing usage of your inhaler if you have asthma. Zinc gel and zinc lozenges, taken in the first 24 hours of the common cold, can shorten the duration and lessen the severity of symptoms. Pain medicines may help with fever, muscle aches, and throat pain. A variety of non-prescription medicines are available to treat congestion and runny nose. Your caregiver   can make recommendations and may suggest nasal or lung inhalers for other symptoms.  HOME CARE INSTRUCTIONS   Only take over-the-counter or prescription medicines for pain, discomfort, or fever as directed by your  caregiver.  Use a warm mist humidifier or inhale steam from a shower to increase air moisture. This may keep secretions moist and make it easier to breathe.  Drink enough water and fluids to keep your urine clear or pale yellow.  Rest as needed.  Return to work when your temperature has returned to normal or as your caregiver advises. You may need to stay home longer to avoid infecting others. You can also use a face mask and careful hand washing to prevent spread of the virus. SEEK MEDICAL CARE IF:   After the first few days, you feel you are getting worse rather than better.  You need your caregiver's advice about medicines to control symptoms.  You develop chills, worsening shortness of breath, or brown or red sputum. These may be signs of pneumonia.  You develop yellow or brown nasal discharge or pain in the face, especially when you bend forward. These may be signs of sinusitis.  You develop a fever, swollen neck glands, pain with swallowing, or white areas in the back of your throat. These may be signs of strep throat. SEEK IMMEDIATE MEDICAL CARE IF:   You have a fever.  You develop severe or persistent headache, ear pain, sinus pain, or chest pain.  You develop wheezing, a prolonged cough, cough up blood, or have a change in your usual mucus (if you have chronic lung disease).  You develop sore muscles or a stiff neck. Document Released: 08/11/2000 Document Revised: 05/10/2011 Document Reviewed: 05/23/2013 ExitCare Patient Information 2015 [employer]. This information is not intended to replace advice given to you by your health care provider. Make sure you discuss any questions you have with your health care provider.  

## 2014-09-17 NOTE — Progress Notes (Signed)
Subjective:     Patient ID: Teresa [name], female   DOB: 10-10-1955, 59 y.o.   MRN: [id number]  HPI Pt here with 2 complaints #1- prod cough for 1 day No OTC meds for sx #2- dysuria for 1 day Multiple visits recently for same  Review of Systems  Constitutional: Negative.   HENT: Positive for congestion, postnasal drip and sinus pressure. Negative for trouble swallowing and voice change.   Respiratory: Positive for cough.   Cardiovascular: Negative.   Genitourinary: Positive for dysuria. Negative for frequency, hematuria, flank pain and pelvic pain.       Objective:   Physical Exam  Constitutional: She appears well-developed and well-nourished.  HENT:  Right Ear: External ear normal.  Left Ear: External ear normal.  Mouth/Throat: Oropharynx is clear and moist. No oropharyngeal exudate.  TM's nl bilat  Neck: Neck supple.  Cardiovascular: Normal rate, regular rhythm and normal heart sounds.   Pulmonary/Chest: Effort normal and breath sounds normal.  Abdominal: Soft. Bowel sounds are normal. She exhibits no distension and no mass. There is no tenderness. There is no rebound and no guarding.  No CVAT  Lymphadenopathy:    She has no cervical adenopathy.  Nursing note and vitals reviewed.      Assessment:     Dysuria URI    Plan:     UA normal today Push fluids For URI sx just OTC meds Rest  F/U prn

## 2014-09-20 ENCOUNTER — Other Ambulatory Visit: Payer: Self-pay | Admitting: *Deleted

## 2014-09-20 DIAGNOSIS — J4540 Moderate persistent asthma, uncomplicated: Secondary | ICD-10-CM

## 2014-09-20 MED ORDER — ALBUTEROL SULFATE HFA 108 (90 BASE) MCG/ACT IN AERS: 2.0000 | INHALATION_SPRAY | Freq: Four times a day (QID) | RESPIRATORY_TRACT | Status: DC | PRN

## 2014-09-21 ENCOUNTER — Encounter: Payer: Self-pay | Admitting: Family

## 2014-09-21 ENCOUNTER — Telehealth: Payer: Self-pay | Admitting: Family Medicine

## 2014-09-21 ENCOUNTER — Ambulatory Visit (INDEPENDENT_AMBULATORY_CARE_PROVIDER_SITE_OTHER): Payer: Commercial Managed Care - HMO | Admitting: Family

## 2014-09-21 VITALS — BP 126/85 | HR 108 | Temp 97.7°F | Ht 63.0 in | Wt 147.6 lb

## 2014-09-21 DIAGNOSIS — J069 Acute upper respiratory infection, unspecified: Secondary | ICD-10-CM

## 2014-09-21 MED ORDER — AZITHROMYCIN 250 MG PO TABS: ORAL_TABLET | ORAL | Status: DC

## 2014-09-21 NOTE — Patient Instructions (Signed)
Upper Respiratory Infection, Adult An upper respiratory infection (URI) is also sometimes known as the common cold. The upper respiratory tract includes the nose, sinuses, throat, trachea, and bronchi. Bronchi are the airways leading to the lungs. Most people improve within 1 week, but symptoms can last up to 2 weeks. A residual cough may last even longer.  CAUSES Many different viruses can infect the tissues lining the upper respiratory tract. The tissues become irritated and inflamed and often become very moist. Mucus production is also common. A cold is contagious. You can easily spread the virus to others by oral contact. This includes kissing, sharing a glass, coughing, or sneezing. Touching your mouth or nose and then touching a surface, which is then touched by another person, can also spread the virus. SYMPTOMS  Symptoms typically develop 1 to 3 days after you come in contact with a cold virus. Symptoms vary from person to person. They may include:  Runny nose.  Sneezing.  Nasal congestion.  Sinus irritation.  Sore throat.  Loss of voice (laryngitis).  Cough.  Fatigue.  Muscle aches.  Loss of appetite.  Headache.  Low-grade fever. DIAGNOSIS  You might diagnose your own cold based on familiar symptoms, since most people get a cold 2 to 3 times a year. Your caregiver can confirm this based on your exam. Most importantly, your caregiver can check that your symptoms are not due to another disease such as strep throat, sinusitis, pneumonia, asthma, or epiglottitis. Blood tests, throat tests, and X-rays are not necessary to diagnose a common cold, but they may sometimes be helpful in excluding other more serious diseases. Your caregiver will decide if any further tests are required. RISKS AND COMPLICATIONS  You may be at risk for a more severe case of the common cold if you smoke cigarettes, have chronic heart disease (such as heart failure) or lung disease (such as asthma), or if  you have a weakened immune system. The very young and very old are also at risk for more serious infections. Bacterial sinusitis, middle ear infections, and bacterial pneumonia can complicate the common cold. The common cold can worsen asthma and chronic obstructive pulmonary disease (COPD). Sometimes, these complications can require emergency medical care and may be life-threatening. PREVENTION  The best way to protect against getting a cold is to practice good hygiene. Avoid oral or hand contact with people with cold symptoms. Wash your hands often if contact occurs. There is no clear evidence that vitamin C, vitamin E, echinacea, or exercise reduces the chance of developing a cold. However, it is always recommended to get plenty of rest and practice good nutrition. TREATMENT  Treatment is directed at relieving symptoms. There is no cure. Antibiotics are not effective, because the infection is caused by a virus, not by bacteria. Treatment may include:  Increased fluid intake. Sports drinks offer valuable electrolytes, sugars, and fluids.  Breathing heated mist or steam (vaporizer or shower).  Eating chicken soup or other clear broths, and maintaining good nutrition.  Getting plenty of rest.  Using gargles or lozenges for comfort.  Controlling fevers with ibuprofen or acetaminophen as directed by your caregiver.  Increasing usage of your inhaler if you have asthma. Zinc gel and zinc lozenges, taken in the first 24 hours of the common cold, can shorten the duration and lessen the severity of symptoms. Pain medicines may help with fever, muscle aches, and throat pain. A variety of non-prescription medicines are available to treat congestion and runny nose. Your caregiver   can make recommendations and may suggest nasal or lung inhalers for other symptoms.  HOME CARE INSTRUCTIONS   Only take over-the-counter or prescription medicines for pain, discomfort, or fever as directed by your  caregiver.  Use a warm mist humidifier or inhale steam from a shower to increase air moisture. This may keep secretions moist and make it easier to breathe.  Drink enough water and fluids to keep your urine clear or pale yellow.  Rest as needed.  Return to work when your temperature has returned to normal or as your caregiver advises. You may need to stay home longer to avoid infecting others. You can also use a face mask and careful hand washing to prevent spread of the virus. SEEK MEDICAL CARE IF:   After the first few days, you feel you are getting worse rather than better.  You need your caregiver's advice about medicines to control symptoms.  You develop chills, worsening shortness of breath, or brown or red sputum. These may be signs of pneumonia.  You develop yellow or brown nasal discharge or pain in the face, especially when you bend forward. These may be signs of sinusitis.  You develop a fever, swollen neck glands, pain with swallowing, or white areas in the back of your throat. These may be signs of strep throat. SEEK IMMEDIATE MEDICAL CARE IF:   You have a fever.  You develop severe or persistent headache, ear pain, sinus pain, or chest pain.  You develop wheezing, a prolonged cough, cough up blood, or have a change in your usual mucus (if you have chronic lung disease).  You develop sore muscles or a stiff neck. Document Released: 08/11/2000 Document Revised: 05/10/2011 Document Reviewed: 05/23/2013 [facility] Patient Information 2015 [place], [place]. This information is not intended to replace advice given to you by your health care provider. Make sure you discuss any questions you have with your health care provider.  - Take meds as prescribed - Use a cool mist humidifier  -Use saline nose sprays frequently -Saline irrigations of the nose can be very helpful if done frequently.  * 4X daily for 1 week*  * Use of a nettie pot can be helpful with this. Follow  directions with this* -Force fluids -For any cough or congestion  Use plain Mucinex- regular strength or max strength is fine   * Children- consult with Pharmacist for dosing -For fever or aces or pains- take tylenol or ibuprofen appropriate for age and weight.  * for fevers greater than 101 orally you may alternate ibuprofen and tylenol every  3 hours. -Throat lozenges if help -New toothbrush in 3 days   Evelina Dun, FNP

## 2014-09-21 NOTE — Progress Notes (Signed)
   Subjective:    Patient ID: Teresa [name], female    DOB: October 16, 1955, 59 y.o.   MRN: [id number]  Cough This is a new problem. The current episode started in the past 7 days. The problem has been gradually worsening. The problem occurs every few minutes. The cough is productive of sputum. Associated symptoms include chills, ear pain, a fever, myalgias, nasal congestion, postnasal drip and rhinorrhea. Pertinent negatives include no headaches, sore throat, shortness of breath or wheezing. The symptoms are aggravated by lying down. She has tried rest and OTC cough suppressant for the symptoms. The treatment provided mild relief. Her past medical history is significant for asthma.      Review of Systems  Constitutional: Positive for fever and chills.  HENT: Positive for ear pain, postnasal drip and rhinorrhea. Negative for sore throat.   Eyes: Negative.   Respiratory: Positive for cough. Negative for shortness of breath and wheezing.   Cardiovascular: Negative.  Negative for palpitations.  Gastrointestinal: Negative.   Endocrine: Negative.   Genitourinary: Negative.   Musculoskeletal: Positive for myalgias.  Neurological: Negative.  Negative for headaches.  Hematological: Negative.   Psychiatric/Behavioral: Negative.   All other systems reviewed and are negative.      Objective:   Physical Exam  Constitutional: She is oriented to person, place, and time. She appears well-developed and well-nourished. No distress.  HENT:  Head: Normocephalic and atraumatic.  Right Ear: External ear normal.  Mouth/Throat: Oropharynx is clear and moist.  Nasal passage erythemas with mild swelling    Eyes: Pupils are equal, round, and reactive to light.  Neck: Normal range of motion. Neck supple. No thyromegaly present.  Cardiovascular: Normal rate, regular rhythm, normal heart sounds and intact distal pulses.   No murmur heard. Pulmonary/Chest: Effort normal and breath sounds normal. No  respiratory distress. She has no wheezes.  nonproductive cough  Abdominal: Soft. Bowel sounds are normal. She exhibits no distension. There is no tenderness.  Musculoskeletal: Normal range of motion. She exhibits no edema or tenderness.  Neurological: She is alert and oriented to person, place, and time. She has normal reflexes. No cranial nerve deficit.  Skin: Skin is warm and dry.  Psychiatric: She has a normal mood and affect. Her behavior is normal. Judgment and thought content normal.  Vitals reviewed.     Temp(Src) 97.7 F (36.5 C) (Oral)  Ht 5\' 3"  (1.6 m)  Wt 147 lb 9.6 oz (66.951 kg)  BMI 26.15 kg/m2     Assessment & Plan:  1. Acute upper respiratory infection - azithromycin (ZITHROMAX) 250 MG tablet; Take 500 mg once, then 250 mg for four days  Dispense: 6 tablet; Refill: 0  - Take meds as prescribed - Use a cool mist humidifier  -Use saline nose sprays frequently -Saline irrigations of the nose can be very helpful if done frequently.  * 4X daily for 1 week*  * Use of a nettie pot can be helpful with this. Follow directions with this* -Force fluids -For any cough or congestion  Use plain Mucinex- regular strength or max strength is fine   * Children- consult with Pharmacist for dosing -For fever or aces or pains- take tylenol or ibuprofen appropriate for age and weight.  * for fevers greater than 101 orally you may alternate ibuprofen and tylenol every  3 hours. -Throat lozenges if help -New toothbrush in 3 days   Evelina Dun, FNP

## 2014-10-10 ENCOUNTER — Encounter: Payer: Self-pay | Admitting: Family Medicine

## 2014-10-10 ENCOUNTER — Ambulatory Visit (INDEPENDENT_AMBULATORY_CARE_PROVIDER_SITE_OTHER): Payer: Commercial Managed Care - HMO | Admitting: Family Medicine

## 2014-10-10 VITALS — BP 117/78 | HR 98 | Temp 97.5°F | Ht 63.0 in | Wt 152.0 lb

## 2014-10-10 DIAGNOSIS — K5732 Diverticulitis of large intestine without perforation or abscess without bleeding: Secondary | ICD-10-CM | POA: Diagnosis not present

## 2014-10-10 MED ORDER — METRONIDAZOLE 500 MG PO TABS: 500.0000 mg | ORAL_TABLET | Freq: Two times a day (BID) | ORAL | Status: DC

## 2014-10-10 MED ORDER — CIPROFLOXACIN HCL 500 MG PO TABS: 500.0000 mg | ORAL_TABLET | Freq: Two times a day (BID) | ORAL | Status: DC
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
## 2014-10-11 ENCOUNTER — Other Ambulatory Visit: Payer: Self-pay | Admitting: Family Medicine

## 2014-10-11 NOTE — Telephone Encounter (Signed)
Patient aware that rx was sent to [place].

## 2014-10-17 ENCOUNTER — Ambulatory Visit (INDEPENDENT_AMBULATORY_CARE_PROVIDER_SITE_OTHER): Payer: Commercial Managed Care - HMO | Admitting: *Deleted

## 2014-10-17 DIAGNOSIS — Z23 Encounter for immunization: Secondary | ICD-10-CM

## 2014-10-17 NOTE — Progress Notes (Signed)
Pt tolerated inj well

## 2014-11-25 ENCOUNTER — Telehealth: Payer: Self-pay | Admitting: Family Medicine

## 2014-11-25 NOTE — Telephone Encounter (Signed)
Please review and advise.

## 2014-11-26 NOTE — Telephone Encounter (Signed)
Left detailed message on patient's voice mail.

## 2014-11-26 NOTE — Telephone Encounter (Signed)
It Appears that she has had several sets of labs this year. I do not see a need for more. Symptoms may change that but at this point I do not see anything that is lacking.   She has had a thyroid panel, 3 CBCs, 4 CMPs, and a lipid panel.   Laroy Apple, MD [facility] 11/26/2014, 7:58 AM

## 2014-11-28 ENCOUNTER — Encounter: Payer: Self-pay | Admitting: Family Medicine

## 2014-11-28 ENCOUNTER — Ambulatory Visit (INDEPENDENT_AMBULATORY_CARE_PROVIDER_SITE_OTHER): Payer: Commercial Managed Care - HMO | Admitting: Family Medicine

## 2014-11-28 VITALS — BP 134/91 | HR 98 | Temp 97.6°F | Ht 63.0 in | Wt 149.2 lb

## 2014-11-28 DIAGNOSIS — K5732 Diverticulitis of large intestine without perforation or abscess without bleeding: Secondary | ICD-10-CM | POA: Diagnosis not present

## 2014-11-28 DIAGNOSIS — Z23 Encounter for immunization: Secondary | ICD-10-CM

## 2014-11-28 MED ORDER — AMOXICILLIN-POT CLAVULANATE 875-125 MG PO TABS: 1.0000 | ORAL_TABLET | Freq: Two times a day (BID) | ORAL | Status: DC

## 2014-11-28 NOTE — Patient Instructions (Signed)
Great to meet you!  I am treating you for diverticulitis with antibiotics Your dizziness could be due to inner ear problems (take flonase everyday) or another cause. It can be exacerbated by being sick with diverticulitis  Diverticulitis Diverticulitis is inflammation or infection of small pouches in your colon that form when you have a condition called diverticulosis. The pouches in your colon are called diverticula. Your colon, or large intestine, is where water is absorbed and stool is formed. Complications of diverticulitis can include:  Bleeding.  Severe infection.  Severe pain.  Perforation of your colon.  Obstruction of your colon. CAUSES  Diverticulitis is caused by bacteria. Diverticulitis happens when stool becomes trapped in diverticula. This allows bacteria to grow in the diverticula, which can lead to inflammation and infection. RISK FACTORS People with diverticulosis are at risk for diverticulitis. Eating a diet that does not include enough fiber from fruits and vegetables may make diverticulitis more likely to develop. SYMPTOMS  Symptoms of diverticulitis may include:  Abdominal pain and tenderness. The pain is normally located on the left side of the abdomen, but may occur in other areas.  Fever and chills.  Bloating.  Cramping.  Nausea.  Vomiting.  Constipation.  Diarrhea.  Blood in your stool. DIAGNOSIS  Your health care provider will ask you about your medical history and do a physical exam. You may need to have tests done because many medical conditions can cause the same symptoms as diverticulitis. Tests may include:  Blood tests.  Urine tests.  Imaging tests of the abdomen, including X-rays and CT scans. When your condition is under control, your health care provider may recommend that you have a colonoscopy. A colonoscopy can show how severe your diverticula are and whether something else is causing your symptoms. TREATMENT  Most cases of  diverticulitis are mild and can be treated at home. Treatment may include:  Taking over-the-counter pain medicines.  Following a clear liquid diet.  Taking antibiotic medicines by mouth for 7-10 days. More severe cases may be treated at a hospital. Treatment may include:  Not eating or drinking.  Taking prescription pain medicine.  Receiving antibiotic medicines through an IV tube.  Receiving fluids and nutrition through an IV tube.  Surgery. HOME CARE INSTRUCTIONS   Follow your health care provider's instructions carefully.  Follow a full liquid diet or other diet as directed by your health care provider. After your symptoms improve, your health care provider may tell you to change your diet. He or she may recommend you eat a high-fiber diet. Fruits and vegetables are good sources of fiber. Fiber makes it easier to pass stool.  Take fiber supplements or probiotics as directed by your health care provider.  Only take medicines as directed by your health care provider.  Keep all your follow-up appointments. SEEK MEDICAL CARE IF:   Your pain does not improve.  You have a hard time eating food.  Your bowel movements do not return to normal. SEEK IMMEDIATE MEDICAL CARE IF:   Your pain becomes worse.  Your symptoms do not get better.  Your symptoms suddenly get worse.  You have a fever.  You have repeated vomiting.  You have bloody or black, tarry stools. MAKE SURE YOU:   Understand these instructions.  Will watch your condition.  Will get help right away if you are not doing well or get worse. Document Released: 11/25/2004 Document Revised: 02/20/2013 Document Reviewed: 01/10/2013 [facility] Patient Information 2015 [place], [place]. This information is not intended  to replace advice given to you by your health care provider. Make sure you discuss any questions you have with your health care provider.  

## 2014-11-28 NOTE — Progress Notes (Signed)
   HPI  Patient presents today here for evaluation of dizziness and abdominal pain  Dizziness Patient's when she's had one a half months intermittent dizziness and weakness. She denies spinning sensation but states that she feels unsteady momentarily, sometimes it's worse with standing up. She has checked her blood pressure during these episodes and it usually 120s to 130s over 80s.  Abdominal pain She complains of 2 weeks of left lower quadrant crampy abdominal pain and diarrhea. Denies hematochezia, melanoma She is also having subjective fever and intermittent chills. She was treated for diverticulitis about 8 weeks ago with Cipro and Flagyl. Her last colonoscopy was July 2015, she had sided colon diverticulosis at that time   PMH: Smoking status noted ROS: Per HPI  Objective: BP 134/91 mmHg  Pulse 98  Temp(Src) 97.6 F (36.4 C) (Oral)  Ht 5\' 3"  (1.6 m)  Wt 149 lb 3.2 oz (67.677 kg)  BMI 26.44 kg/m2 Gen: NAD, alert, cooperative with exam HEENT: NCAT, left TM obscured by cerumen, right TM within normal limits, nares clear CV: RRR, good S1/S2, no murmur Resp: CTABL, no wheezes, non-labored Abd: Soft, slight tenderness to palpation of left lower quadrant, no guarding, positive bowel sounds Ext: No edema, warm Neuro: Alert and oriented, No gross deficits  Assessment and plan:  # Diverticulitis Treating diverticulitis with crampy abdominal pain,   subjective fever and diarrhea Augmentin, previously treated with Cipro and Flagyl 8 weeks ago Discussed long-term consequences of recurrent diverticulitis and possibility of surgery referral.  # Dizziness Unclear etiology, no vertigo, likely exacerbated by diverticulitis Discussed eustachian tube dysfunction, Flonase daily Reviewed red flags for return   Meds ordered this encounter  Medications  . amoxicillin-clavulanate (AUGMENTIN) 875-125 MG tablet    Sig: Take 1 tablet by mouth 2 (two) times daily.    Dispense:  20  tablet    Refill:  0    Laroy Apple, MD [facility] 11/28/2014, 9:32 AM

## 2014-12-06 ENCOUNTER — Telehealth: Payer: Self-pay | Admitting: Family Medicine

## 2014-12-06 MED ORDER — CIPROFLOXACIN HCL 500 MG PO TABS: 500.0000 mg | ORAL_TABLET | Freq: Two times a day (BID) | ORAL | Status: DC

## 2014-12-06 MED ORDER — METRONIDAZOLE 500 MG PO TABS: 500.0000 mg | ORAL_TABLET | Freq: Two times a day (BID) | ORAL | Status: DC

## 2014-12-06 NOTE — Telephone Encounter (Signed)
Left this information on patient's voicemail 

## 2014-12-06 NOTE — Telephone Encounter (Signed)
Treating diverticulitis, appears no improvement with augmentin which is better coverage than cipro flagyl, however will try cipro flagyl.   Laroy Apple, MD [facility] 12/06/2014, 9:45 AM

## 2014-12-16 ENCOUNTER — Ambulatory Visit (INDEPENDENT_AMBULATORY_CARE_PROVIDER_SITE_OTHER): Payer: Medicare Other | Admitting: Family Medicine

## 2014-12-16 ENCOUNTER — Encounter: Payer: Self-pay | Admitting: Family Medicine

## 2014-12-16 VITALS — BP 112/74 | HR 90 | Temp 97.4°F | Ht 63.0 in | Wt 146.0 lb

## 2014-12-16 DIAGNOSIS — K5904 Chronic idiopathic constipation: Secondary | ICD-10-CM | POA: Diagnosis not present

## 2014-12-16 MED ORDER — FLEET ENEMA 7-19 GM/118ML RE ENEM: 1.0000 | ENEMA | Freq: Every day | RECTAL | Status: DC | PRN

## 2014-12-16 MED ORDER — POLYETHYLENE GLYCOL 3350 17 GM/SCOOP PO POWD: 17.0000 g | Freq: Two times a day (BID) | ORAL | Status: DC | PRN

## 2014-12-16 NOTE — Patient Instructions (Signed)

## 2014-12-16 NOTE — Progress Notes (Signed)
BP 112/74 mmHg  Pulse 90  Temp(Src) 97.4 F (36.3 C) (Oral)  Ht 5\' 3"  (1.6 m)  Wt 146 lb (66.225 kg)  BMI 25.87 kg/m2   Subjective:    Patient ID: Teresa [name], female    DOB: [birth year], 59 y.o.   MRN: [id number]  HPI: Teresa [name] is a 59 y.o. female presenting on 12/16/2014 for Abdominal Pain and Constipation   HPI Constipation and abdominal pain Patient has been having constipation and abdominal pain for the past week. She was treated for diverticulitis and finished her antibiotics this morning. She is taking a probiotic and Colace twice daily and in the past 4 days all she has had is small round pebbles as stools. She has not had a decent bowel movement in over 4 days. She has abdominal pain that is more diffuse but worse in the lower abdomen then upper. She denies any blood in her stools or any fevers or chills. She is still passing gas. She had a colonoscopy earlier this year.  Relevant past medical, surgical, family and social history reviewed and updated as indicated. Interim medical history since our last visit reviewed. Allergies and medications reviewed and updated.  Review of Systems  Constitutional: Negative for fever and chills.  HENT: Negative for congestion, ear discharge and ear pain.   Eyes: Negative for redness and visual disturbance.  Respiratory: Negative for chest tightness and shortness of breath.   Cardiovascular: Negative for chest pain and leg swelling.  Gastrointestinal: Positive for abdominal pain and constipation. Negative for nausea, vomiting, diarrhea and blood in stool.  Genitourinary: Negative for dysuria and difficulty urinating.  Musculoskeletal: Negative for back pain and gait problem.  Skin: Negative for rash.  Neurological: Negative for light-headedness and headaches.  Psychiatric/Behavioral: Negative for behavioral problems and agitation.  All other systems reviewed and are negative.   Per HPI unless specifically indicated  above     Medication List       This list is accurate as of: 12/16/14  6:20 PM.  Always use your most recent med list.               albuterol 108 (90 BASE) MCG/ACT inhaler  Commonly known as:  PROAIR HFA  Inhale 2 puffs into the lungs 4 (four) times daily as needed for wheezing or shortness of breath.     ALPRAZolam 0.25 MG tablet  Commonly known as:  XANAX  Take 0.25 mg by mouth. Takes PRN     budesonide-formoterol 160-4.5 MCG/ACT inhaler  Commonly known as:  SYMBICORT  Inhale 2 puffs into the lungs 2 (two) times daily.     cetirizine 10 MG tablet  Commonly known as:  ZYRTEC  Take 1 tablet (10 mg total) by mouth every morning.     ciprofloxacin 500 MG tablet  Commonly known as:  CIPRO  Take 1 tablet (500 mg total) by mouth 2 (two) times daily.     cyclobenzaprine 10 MG tablet  Commonly known as:  FLEXERIL  Take 10 mg by mouth every 6 (six) hours as needed for muscle spasms.     dexlansoprazole 60 MG capsule  Commonly known as:  DEXILANT  TAKE ONE (1) CAPSULE EACH DAY     diclofenac sodium 1 % Gel  Commonly known as:  VOLTAREN  Apply topically 4 (four) times daily.     docusate sodium 100 MG capsule  Commonly known as:  COLACE  Take 100-200 mg by mouth daily.  fluocinonide-emollient 0.05 % cream  Commonly known as:  LIDEX-E  Apply 1 application topically 2 (two) times daily.     fluticasone 50 MCG/ACT nasal spray  Commonly known as:  FLONASE  Place 2 sprays into both nostrils daily.     HYDROcodone-acetaminophen 5-325 MG tablet  Commonly known as:  NORCO/VICODIN  Take 1 tablet by mouth every 6 (six) hours as needed for moderate pain.     MAGNESIUM PO  Take 1 tablet by mouth daily.     metroNIDAZOLE 500 MG tablet  Commonly known as:  FLAGYL  Take 1 tablet (500 mg total) by mouth 2 (two) times daily.     montelukast 10 MG tablet  Commonly known as:  SINGULAIR  Take 1 tablet (10 mg total) by mouth daily.     ondansetron 4 MG tablet  Commonly  known as:  ZOFRAN  Take 1 tablet (4 mg total) by mouth every 8 (eight) hours as needed for nausea or vomiting.     polyethylene glycol powder powder  Commonly known as:  GLYCOLAX/MIRALAX  Take 17 g by mouth 2 (two) times daily as needed.     PROBIOTIC DAILY PO  Take by mouth.     sodium phosphate 7-19 GM/118ML Enem  Place 133 mLs (1 enema total) rectally daily as needed for severe constipation.     traMADol 50 MG tablet  Commonly known as:  ULTRAM  Take 1 tablet (50 mg total) by mouth every 6 (six) hours as needed.           Objective:    BP 112/74 mmHg  Pulse 90  Temp(Src) 97.4 F (36.3 C) (Oral)  Ht 5\' 3"  (1.6 m)  Wt 146 lb (66.225 kg)  BMI 25.87 kg/m2  Wt Readings from Last 3 Encounters:  12/16/14 146 lb (66.225 kg)  11/28/14 149 lb 3.2 oz (67.677 kg)  10/10/14 152 lb (68.947 kg)    Physical Exam  Constitutional: She is oriented to person, place, and time. She appears well-developed and well-nourished. No distress.  Eyes: Conjunctivae and EOM are normal. Pupils are equal, round, and reactive to light.  Cardiovascular: Normal rate and regular rhythm.   No murmur heard. Pulmonary/Chest: Effort normal and breath sounds normal. No respiratory distress. She has no wheezes.  Abdominal: There is tenderness (diffuse, but worse in lower left quadrant and suprapubic). There is no rebound and no guarding.  Musculoskeletal: Normal range of motion. She exhibits no edema or tenderness.  Neurological: She is alert and oriented to person, place, and time. Coordination normal.  Skin: Skin is warm and dry. No rash noted. She is not diaphoretic.  Psychiatric: She has a normal mood and affect. Her behavior is normal.  Vitals reviewed.   Results for orders placed or performed in visit on 09/17/14  POCT UA - Microscopic Only  Result Value Ref Range   WBC, Ur, HPF, POC neg    RBC, urine, microscopic neg    Bacteria, U Microscopic neg    Mucus, UA neg    Epithelial cells, urine per  micros occ    Crystals, Ur, HPF, POC neg    Casts, Ur, LPF, POC neg    Yeast, UA neg   POCT urinalysis dipstick  Result Value Ref Range   Color, UA gold    Clarity, UA clear    Glucose, UA neg    Bilirubin, UA neg    Ketones, UA neg    Spec Grav, UA 1.010    Blood,  UA neg    pH, UA 7.0    Protein, UA neg    Urobilinogen, UA negative    Nitrite, UA neg    Leukocytes, UA Negative Negative      Assessment & Plan:   Problem List Items Addressed This Visit    None    Visit Diagnoses    Functional constipation    -  Primary    We'll do MiraLAX and enema and increased fiber and fluid intake    Relevant Medications    Probiotic Product (PROBIOTIC DAILY PO)    polyethylene glycol powder (GLYCOLAX/MIRALAX) powder    sodium phosphate (FLEET) 7-19 GM/118ML ENEM        Follow up plan: Return if symptoms worsen or fail to improve.  Caryl Pina, MD [facility] 12/16/2014, 6:20 PM

## 2014-12-26 NOTE — Telephone Encounter (Signed)
Error

## 2015-01-02 ENCOUNTER — Other Ambulatory Visit: Payer: Self-pay | Admitting: *Deleted

## 2015-01-02 DIAGNOSIS — Z9109 Other allergy status, other than to drugs and biological substances: Secondary | ICD-10-CM

## 2015-01-02 MED ORDER — FLUTICASONE PROPIONATE 50 MCG/ACT NA SUSP: 2.0000 | Freq: Every day | NASAL | Status: DC

## 2015-01-07 DIAGNOSIS — M5416 Radiculopathy, lumbar region: Secondary | ICD-10-CM | POA: Diagnosis not present

## 2015-01-07 DIAGNOSIS — R5383 Other fatigue: Secondary | ICD-10-CM | POA: Diagnosis not present

## 2015-01-07 DIAGNOSIS — M545 Low back pain: Secondary | ICD-10-CM | POA: Diagnosis not present

## 2015-01-07 DIAGNOSIS — K219 Gastro-esophageal reflux disease without esophagitis: Secondary | ICD-10-CM | POA: Diagnosis not present

## 2015-01-29 ENCOUNTER — Encounter: Payer: Self-pay | Admitting: Family Medicine

## 2015-01-29 ENCOUNTER — Ambulatory Visit (INDEPENDENT_AMBULATORY_CARE_PROVIDER_SITE_OTHER): Payer: Medicare Other | Admitting: Family Medicine

## 2015-01-29 VITALS — BP 114/73 | HR 98 | Temp 98.0°F | Ht 63.0 in | Wt 149.2 lb

## 2015-01-29 DIAGNOSIS — J4531 Mild persistent asthma with (acute) exacerbation: Secondary | ICD-10-CM | POA: Diagnosis not present

## 2015-01-29 MED ORDER — AMOXICILLIN-POT CLAVULANATE 875-125 MG PO TABS: 1.0000 | ORAL_TABLET | Freq: Two times a day (BID) | ORAL | Status: DC

## 2015-01-29 MED ORDER — BENZONATATE 200 MG PO CAPS: 200.0000 mg | ORAL_CAPSULE | Freq: Three times a day (TID) | ORAL | Status: DC | PRN
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
## 2015-02-01 ENCOUNTER — Encounter: Payer: Self-pay | Admitting: Pediatrics

## 2015-02-01 ENCOUNTER — Ambulatory Visit (INDEPENDENT_AMBULATORY_CARE_PROVIDER_SITE_OTHER): Payer: Medicare Other | Admitting: Pediatrics

## 2015-02-01 VITALS — BP 130/85 | HR 89 | Temp 97.0°F | Ht 63.0 in | Wt 150.0 lb

## 2015-02-01 DIAGNOSIS — R21 Rash and other nonspecific skin eruption: Secondary | ICD-10-CM

## 2015-02-01 NOTE — Progress Notes (Signed)
Subjective:    Patient ID: Teresa [name], female    DOB: 07/29/1955, 60 y.o.   MRN: [id number]  CC: Skin Problem   HPI: Teresa [name] is a 59 y.o. female presenting for Skin Problem  She has a red raised 1 cm lesion on back of R leg. Does not itch or burn She had a family member recently visit who had h/o MRSA, pt worried that it is MRSA No drainage, no pain No breaks in the skin or cuts or scrapes in the area. Has not put anything on it She is a few days into course of amoxicillin for acute bronchitis/sinusitis. Still with some congestion now, no fevers at home Has otherwise been feeling well.   Depression screen [facility] 2/9 11/28/2014 09/17/2014 08/29/2014 07/12/2014 03/18/2014  Decreased Interest 0 0 0 0 0  Down, Depressed, Hopeless 0 0 0 0 0  PHQ - 2 Score 0 0 0 0 0  Altered sleeping - - - - -  Tired, decreased energy - - - - -  Change in appetite - - - - -  Feeling bad or failure about yourself  - - - - -  Trouble concentrating - - - - -  Moving slowly or fidgety/restless - - - - -  Suicidal thoughts - - - - -  PHQ-9 Score - - - - -     Relevant past medical, surgical, family and social history reviewed and updated as indicated. Interim medical history since our last visit reviewed. Allergies and medications reviewed and updated.    ROS: Per HPI unless specifically indicated above  History  Smoking status  . Former Smoker  . Quit date: 08/20/1991  Smokeless tobacco  . Never Used    Past Medical History Patient Active Problem List   Diagnosis Date Noted  . Diverticulitis of colon 11/28/2014  . Asthma, chronic 03/18/2014  . Hyperglycemia 12/06/2012  . Paresthesias 12/05/2012  . GERD (gastroesophageal reflux disease) 08/17/2012  . Eczema 08/17/2012  . Bronchospasm 08/04/2012  . Constipation 08/03/2012  . SOB (shortness of breath) 07/25/2012  . Tachycardia 07/25/2012  . Papilloma of breast 09/01/2011  . GAD (generalized anxiety disorder) 05/08/2007  .  DRUG ABUSE, IN REMISSION 05/08/2007  . ALCOHOL ABUSE, IN REMISSION 05/08/2007  . Depression 05/08/2007  . Irritable bowel syndrome 05/08/2007  . DEGENERATIVE JOINT DISEASE, KNEES, BILATERAL 05/08/2007  . [place] DISEASE 05/08/2007  . OSTEOPENIA 05/08/2007  . VOCAL CORD POLYP, HX OF 05/08/2007  . LIVER HEMANGIOMA 12/02/2006  . OVARIAN CYST, RIGHT 12/02/2006  . ESOPHAGEAL STRICTURE 07/08/2005  . HIATAL HERNIA 07/08/2005  . Diverticulosis of large intestine 07/08/2005    Current Outpatient Prescriptions  Medication Sig Dispense Refill  . albuterol (PROAIR HFA) 108 (90 BASE) MCG/ACT inhaler Inhale 2 puffs into the lungs 4 (four) times daily as needed for wheezing or shortness of breath. 3 Inhaler 1  . ALPRAZolam (XANAX) 0.25 MG tablet Take 0.25 mg by mouth. Takes PRN    . amoxicillin-clavulanate (AUGMENTIN) 875-125 MG tablet Take 1 tablet by mouth 2 (two) times daily. Take all of this medication 20 tablet 0  . benzonatate (TESSALON) 200 MG capsule Take 1 capsule (200 mg total) by mouth 3 (three) times daily as needed for cough. 20 capsule 0  . budesonide-formoterol (SYMBICORT) 160-4.5 MCG/ACT inhaler Inhale 2 puffs into the lungs 2 (two) times daily. 1 Inhaler 11  . cetirizine (ZYRTEC) 10 MG tablet Take 1 tablet (10 mg total) by mouth every morning.  90 tablet 3  . cyclobenzaprine (FLEXERIL) 10 MG tablet Take 10 mg by mouth every 6 (six) hours as needed for muscle spasms.    Marland Kitchen dexlansoprazole (DEXILANT) 60 MG capsule TAKE ONE (1) CAPSULE EACH DAY (Patient taking differently: Take 60 mg by mouth daily. TAKE ONE (1) CAPSULE EACH DAY) 90 capsule 3  . diclofenac sodium (VOLTAREN) 1 % GEL Apply topically 4 (four) times daily.    Marland Kitchen docusate sodium (COLACE) 100 MG capsule Take 100-200 mg by mouth daily.     . fluocinonide-emollient (LIDEX-E) 0.05 % cream Apply 1 application topically 2 (two) times daily. 60 g 5  . fluticasone (FLONASE) 50 MCG/ACT nasal spray Place 2 sprays into both  nostrils daily. 16 g 2  . HYDROcodone-acetaminophen (NORCO/VICODIN) 5-325 MG tablet Take 1 tablet by mouth every 6 (six) hours as needed for moderate pain.    Marland Kitchen MAGNESIUM PO Take 1 tablet by mouth daily.     . metroNIDAZOLE (FLAGYL) 500 MG tablet Take 1 tablet (500 mg total) by mouth 2 (two) times daily. 20 tablet 0  . montelukast (SINGULAIR) 10 MG tablet Take 1 tablet (10 mg total) by mouth daily. 90 tablet 4  . ondansetron (ZOFRAN) 4 MG tablet Take 1 tablet (4 mg total) by mouth every 8 (eight) hours as needed for nausea or vomiting. 20 tablet 0  . polyethylene glycol powder (GLYCOLAX/MIRALAX) powder Take 17 g by mouth 2 (two) times daily as needed. 3350 g 1  . Probiotic Product (PROBIOTIC DAILY PO) Take by mouth.    . sodium phosphate (FLEET) 7-19 GM/118ML ENEM Place 133 mLs (1 enema total) rectally daily as needed for severe constipation. 10 enema 0  . traMADol (ULTRAM) 50 MG tablet Take 1 tablet (50 mg total) by mouth every 6 (six) hours as needed. 45 tablet 2   No current facility-administered medications for this visit.       Objective:    BP 130/85 mmHg  Pulse 89  Temp(Src) 97 F (36.1 C) (Oral)  Ht 5\' 3"  (1.6 m)  Wt 150 lb (68.04 kg)  BMI 26.58 kg/m2  Wt Readings from Last 3 Encounters:  02/01/15 150 lb (68.04 kg)  01/29/15 149 lb 3.2 oz (67.677 kg)  12/16/14 146 lb (66.225 kg)     Gen: NAD, alert, cooperative with exam, NCAT EYES: EOMI, no scleral injection or icterus ENT:  TMs pearly gray b/l, OP with mild erythema LYMPH: no cervical LAD CV: NRRR, normal S1/S2, no murmur, distal pulses 2+ b/l Resp: CTABL, no wheezes, normal WOB Abd: +BS, soft, NTND. no guarding or organomegaly Ext: No edema, warm Neuro: Alert and oriented Skin: 1 cm red raised plaque, poorly defined borders, no fluctuance or hardness below the skin, no discharge.     Assessment & Plan:    Ambrey was seen today for single red raised plaque/bump, consistent with local inflammatory reaction to  something such as a bug bite. Does not itch. Skin intact, no discharge. No other skin lesions. Can try topical benadryl or hydrocortisone if it starts to itch. Does not appear to be a MRSA infection at this time. Cont treatment for bronchitis/sinusitis.  Diagnoses and all orders for this visit:  Rash    Follow up plan: Return if symptoms worsen or fail to improve.  Assunta Found, MD [facility] 02/01/2015, 11:38 AM

## 2015-02-01 NOTE — Patient Instructions (Signed)
Try benadryl for leg

## 2015-02-27 ENCOUNTER — Ambulatory Visit (INDEPENDENT_AMBULATORY_CARE_PROVIDER_SITE_OTHER): Payer: Medicare Other | Admitting: Family

## 2015-02-27 ENCOUNTER — Encounter: Payer: Self-pay | Admitting: Family

## 2015-02-27 VITALS — BP 133/87 | HR 108 | Temp 98.1°F | Ht 63.0 in | Wt 153.2 lb

## 2015-02-27 DIAGNOSIS — J3489 Other specified disorders of nose and nasal sinuses: Secondary | ICD-10-CM

## 2015-02-27 DIAGNOSIS — J309 Allergic rhinitis, unspecified: Secondary | ICD-10-CM

## 2015-02-27 DIAGNOSIS — R220 Localized swelling, mass and lump, head: Secondary | ICD-10-CM

## 2015-02-27 MED ORDER — MUPIROCIN 2 % EX OINT: 1.0000 "application " | TOPICAL_OINTMENT | Freq: Two times a day (BID) | CUTANEOUS | Status: DC

## 2015-02-27 MED ORDER — MOMETASONE FUROATE 50 MCG/ACT NA SUSP: 2.0000 | Freq: Every day | NASAL | Status: DC

## 2015-02-27 NOTE — Progress Notes (Signed)
   Subjective:    Patient ID: Teresa [name], female    DOB: April 17, 1955, 59 y.o.   MRN: [id number]  HPI PT presents to the office today for nasal irration that is worse in the left. Pt states she has "crusty things" in her nose and if she does not "pick it" it makes it hard for her to breath. PT denies any rhinorrhea, sore throat, cough, SOB, sinus problems, or epistaxis . Pt states she has been taking her Flonase or saline spray everyday.  Review of Systems  Constitutional: Negative.   HENT: Negative.   Eyes: Negative.   Respiratory: Negative.  Negative for shortness of breath.   Cardiovascular: Negative.  Negative for palpitations.  Gastrointestinal: Negative.   Endocrine: Negative.   Genitourinary: Negative.   Musculoskeletal: Negative.   Neurological: Negative.  Negative for headaches.  Hematological: Negative.   Psychiatric/Behavioral: Negative.   All other systems reviewed and are negative.      Objective:   Physical Exam  Constitutional: She is oriented to person, place, and time. She appears well-developed and well-nourished. No distress.  HENT:  Head: Normocephalic and atraumatic.  Right Ear: External ear normal.  Left Ear: External ear normal.  Mouth/Throat: Oropharynx is clear and moist.  Nasal passage erythemas with moderate swelling    Eyes: Pupils are equal, round, and reactive to light.  Neck: Normal range of motion. Neck supple. No thyromegaly present.  Cardiovascular: Normal rate, regular rhythm, normal heart sounds and intact distal pulses.   No murmur heard. Pulmonary/Chest: Effort normal and breath sounds normal. No respiratory distress. She has no wheezes.  Abdominal: Soft. Bowel sounds are normal. She exhibits no distension. There is no tenderness.  Musculoskeletal: Normal range of motion. She exhibits no edema or tenderness.  Neurological: She is alert and oriented to person, place, and time. She has normal reflexes. No cranial nerve deficit.  Skin:  Skin is warm and dry.  Psychiatric: She has a normal mood and affect. Her behavior is normal. Judgment and thought content normal.  Vitals reviewed.     BP 133/87 mmHg  Pulse 108  Temp(Src) 98.1 F (36.7 C) (Oral)  Ht 5\' 3"  (1.6 m)  Wt 153 lb 3.2 oz (69.491 kg)  BMI 27.14 kg/m2     Assessment & Plan:  1. Nasal swelling - mometasone (NASONEX) 50 MCG/ACT nasal spray; Place 2 sprays into the nose daily.  Dispense: 17 g; Refill: 12  2. Allergic rhinitis, unspecified allergic rhinitis type - mometasone (NASONEX) 50 MCG/ACT nasal spray; Place 2 sprays into the nose daily.  Dispense: 17 g; Refill: 12  3. Nasal crusting - mupirocin ointment (BACTROBAN) 2 %; Place 1 application into the nose 2 (two) times daily.  Dispense: 22 g; Refill: 0  Avoid allergens when possible Continue Singulair and Zyrtec Do not pick Apply bactroban for any irration or lesion RTO prn  Evelina Dun, FNP

## 2015-02-27 NOTE — Patient Instructions (Signed)
Hay Fever Hay fever is an allergic reaction to particles in the air. It cannot be passed from person to person. It cannot be cured, but it can be controlled. CAUSES  Hay fever is caused by something that triggers an allergic reaction (allergens). The following are examples of allergens:  Ragweed.  Feathers.  Animal dander.  Grass and tree pollens.  Cigarette smoke.  House dust.  Pollution. SYMPTOMS   Sneezing.  Runny or stuffy nose.  Tearing eyes.  Itchy eyes, nose, mouth, throat, skin, or other area.  Sore throat.  Headache.  Decreased sense of smell or taste. DIAGNOSIS Your caregiver will perform a physical exam and ask questions about the symptoms you are having.Allergy testing may be done to determine exactly what triggers your hay fever.  TREATMENT   Over-the-counter medicines may help symptoms. These include:  Antihistamines.  Decongestants. These may help with nasal congestion.  Your caregiver may prescribe medicines if over-the-counter medicines do not work.  Some people benefit from allergy shots when other medicines are not helpful. HOME CARE INSTRUCTIONS   Avoid the allergen that is causing your symptoms, if possible.  Take all medicine as told by your caregiver. SEEK MEDICAL CARE IF:   You have severe allergy symptoms and your current medicines are not helping.  Your treatment was working at one time, but you are now experiencing symptoms.  You have sinus congestion and pressure.  You develop a fever or headache.  You have thick nasal discharge.  You have asthma and have a worsening cough and wheezing. SEEK IMMEDIATE MEDICAL CARE IF:   You have swelling of your tongue or lips.  You have trouble breathing.  You feel lightheaded or like you are going to faint.  You have cold sweats.  You have a fever.   This information is not intended to replace advice given to you by your health care provider. Make sure you discuss any  questions you have with your health care provider.   Document Released: 02/15/2005 Document Revised: 05/10/2011 Document Reviewed: 08/28/2014 Elsevier Interactive Patient Education 2016 [employer]  

## 2015-03-11 ENCOUNTER — Encounter: Payer: Medicare Other | Admitting: Family

## 2015-03-12 DIAGNOSIS — M5416 Radiculopathy, lumbar region: Secondary | ICD-10-CM | POA: Diagnosis not present

## 2015-03-12 DIAGNOSIS — M545 Low back pain: Secondary | ICD-10-CM | POA: Diagnosis not present

## 2015-03-12 DIAGNOSIS — F5109 Other insomnia not due to a substance or known physiological condition: Secondary | ICD-10-CM | POA: Diagnosis not present

## 2015-03-12 DIAGNOSIS — R5383 Other fatigue: Secondary | ICD-10-CM | POA: Diagnosis not present

## 2015-03-12 DIAGNOSIS — K5792 Diverticulitis of intestine, part unspecified, without perforation or abscess without bleeding: Secondary | ICD-10-CM | POA: Diagnosis not present

## 2015-04-03 ENCOUNTER — Telehealth: Payer: Self-pay | Admitting: Family Medicine

## 2015-04-03 DIAGNOSIS — J3489 Other specified disorders of nose and nasal sinuses: Secondary | ICD-10-CM

## 2015-04-04 NOTE — Telephone Encounter (Signed)
Referral to ENT sent.

## 2015-04-07 ENCOUNTER — Other Ambulatory Visit: Payer: Self-pay | Admitting: Family

## 2015-04-07 NOTE — Telephone Encounter (Signed)
Patient aware that referral has been placed.  

## 2015-04-29 DIAGNOSIS — J309 Allergic rhinitis, unspecified: Secondary | ICD-10-CM | POA: Diagnosis not present

## 2015-04-29 DIAGNOSIS — R490 Dysphonia: Secondary | ICD-10-CM | POA: Insufficient documentation

## 2015-04-29 DIAGNOSIS — R0981 Nasal congestion: Secondary | ICD-10-CM | POA: Diagnosis not present

## 2015-04-29 DIAGNOSIS — J342 Deviated nasal septum: Secondary | ICD-10-CM | POA: Insufficient documentation

## 2015-04-29 DIAGNOSIS — J3489 Other specified disorders of nose and nasal sinuses: Secondary | ICD-10-CM | POA: Diagnosis not present

## 2015-04-29 DIAGNOSIS — J310 Chronic rhinitis: Secondary | ICD-10-CM | POA: Insufficient documentation

## 2015-04-29 DIAGNOSIS — K219 Gastro-esophageal reflux disease without esophagitis: Secondary | ICD-10-CM | POA: Diagnosis not present

## 2015-05-01 ENCOUNTER — Ambulatory Visit (INDEPENDENT_AMBULATORY_CARE_PROVIDER_SITE_OTHER): Payer: Medicare Other

## 2015-05-01 ENCOUNTER — Encounter: Payer: Self-pay | Admitting: Family

## 2015-05-01 ENCOUNTER — Ambulatory Visit (INDEPENDENT_AMBULATORY_CARE_PROVIDER_SITE_OTHER): Payer: Medicare Other | Admitting: Family

## 2015-05-01 VITALS — BP 117/79 | HR 100 | Temp 97.1°F | Ht 63.0 in | Wt 151.0 lb

## 2015-05-01 DIAGNOSIS — R739 Hyperglycemia, unspecified: Secondary | ICD-10-CM

## 2015-05-01 DIAGNOSIS — Z7689 Persons encountering health services in other specified circumstances: Secondary | ICD-10-CM | POA: Diagnosis not present

## 2015-05-01 DIAGNOSIS — Z114 Encounter for screening for human immunodeficiency virus [HIV]: Secondary | ICD-10-CM

## 2015-05-01 DIAGNOSIS — Z01419 Encounter for gynecological examination (general) (routine) without abnormal findings: Secondary | ICD-10-CM | POA: Diagnosis not present

## 2015-05-01 DIAGNOSIS — Z0000 Encounter for general adult medical examination without abnormal findings: Secondary | ICD-10-CM | POA: Diagnosis not present

## 2015-05-01 DIAGNOSIS — R5383 Other fatigue: Secondary | ICD-10-CM | POA: Diagnosis not present

## 2015-05-01 DIAGNOSIS — K581 Irritable bowel syndrome with constipation: Secondary | ICD-10-CM

## 2015-05-01 DIAGNOSIS — Z780 Asymptomatic menopausal state: Secondary | ICD-10-CM

## 2015-05-01 DIAGNOSIS — R1319 Other dysphagia: Secondary | ICD-10-CM | POA: Insufficient documentation

## 2015-05-01 DIAGNOSIS — K5900 Constipation, unspecified: Secondary | ICD-10-CM

## 2015-05-01 DIAGNOSIS — K219 Gastro-esophageal reflux disease without esophagitis: Secondary | ICD-10-CM

## 2015-05-01 DIAGNOSIS — J4540 Moderate persistent asthma, uncomplicated: Secondary | ICD-10-CM

## 2015-05-01 DIAGNOSIS — R1310 Dysphagia, unspecified: Secondary | ICD-10-CM

## 2015-05-01 DIAGNOSIS — F411 Generalized anxiety disorder: Secondary | ICD-10-CM

## 2015-05-01 DIAGNOSIS — M1710 Unilateral primary osteoarthritis, unspecified knee: Secondary | ICD-10-CM

## 2015-05-01 DIAGNOSIS — Z1211 Encounter for screening for malignant neoplasm of colon: Secondary | ICD-10-CM

## 2015-05-01 DIAGNOSIS — Z1159 Encounter for screening for other viral diseases: Secondary | ICD-10-CM | POA: Diagnosis not present

## 2015-05-01 DIAGNOSIS — F329 Major depressive disorder, single episode, unspecified: Secondary | ICD-10-CM

## 2015-05-01 DIAGNOSIS — F32A Depression, unspecified: Secondary | ICD-10-CM

## 2015-05-01 DIAGNOSIS — IMO0002 Reserved for concepts with insufficient information to code with codable children: Secondary | ICD-10-CM

## 2015-05-01 LAB — POCT URINALYSIS DIPSTICK
Bilirubin, UA: NEGATIVE
Clarity, UA: NEGATIVE
Glucose, UA: NEGATIVE
KETONES UA: NEGATIVE
Leukocytes, UA: NEGATIVE
Nitrite, UA: NEGATIVE
PH UA: 5
PROTEIN UA: NEGATIVE
RBC UA: NEGATIVE
SPEC GRAV UA: 1.02
UROBILINOGEN UA: NEGATIVE

## 2015-05-01 LAB — POCT UA - MICROSCOPIC ONLY
CASTS, UR, LPF, POC: NEGATIVE
CRYSTALS, UR, HPF, POC: NEGATIVE
Mucus, UA: NEGATIVE
RBC, URINE, MICROSCOPIC: NEGATIVE
YEAST UA: NEGATIVE

## 2015-05-01 MED ORDER — ESCITALOPRAM OXALATE 10 MG PO TABS: 10.0000 mg | ORAL_TABLET | Freq: Every day | ORAL | Status: DC

## 2015-05-01 NOTE — Progress Notes (Signed)
Subjective:    Patient ID: Teresa [name], female    DOB: [birth year], 60 y.o.   MRN: [id number]  Pt presents to the office today for CPE with pap. Pt is complaining of dysphagia today. PT states she has had to have her "throat stretched three times in the past".  Gynecologic Exam Associated symptoms include constipation and nausea. Pertinent negatives include no headaches.  Anxiety Presents for follow-up visit. Onset was 1 to 5 years ago. The problem has been waxing and waning. Symptoms include depressed mood, excessive worry, insomnia, nausea, nervous/anxious behavior, palpitations and panic. Patient reports no irritability or shortness of breath. Symptoms occur occasionally. The severity of symptoms is causing significant distress. The quality of sleep is fair. Nighttime awakenings: occasional.   Her past medical history is significant for asthma. Past treatments include nothing.  Gastroesophageal Reflux She complains of belching, coughing, dysphagia, heartburn, nausea and wheezing. She reports no hoarse voice. This is a chronic problem. The current episode started more than 1 month ago. The problem has been waxing and waning. The heartburn wakes her from sleep. The heartburn does not limit her activity. The symptoms are aggravated by lying down and certain foods. She has tried a PPI for the symptoms. The treatment provided moderate relief.  Constipation This is a chronic problem. The current episode started more than 1 year ago. The problem has been gradually improving since onset. Her stool frequency is 4 to 5 times per week. The stool is described as formed. Associated symptoms include nausea. She has tried laxatives for the symptoms. The treatment provided mild relief.  Asthma She complains of cough and wheezing. There is no hoarse voice or shortness of breath. This is a chronic problem. The problem occurs intermittently. The problem has been waxing and waning. The cough is non-productive.  Associated symptoms include heartburn and sneezing. Pertinent negatives include no dyspnea on exertion, ear pain, headaches or nasal congestion. Her symptoms are alleviated by rest and beta-agonist. She reports minimal improvement on treatment. Her past medical history is significant for asthma.      Review of Systems  Constitutional: Negative.  Negative for irritability.  HENT: Positive for sneezing. Negative for ear pain and hoarse voice.   Eyes: Negative.   Respiratory: Positive for cough and wheezing. Negative for shortness of breath.   Cardiovascular: Positive for palpitations. Negative for dyspnea on exertion.  Gastrointestinal: Positive for heartburn, dysphagia, nausea and constipation.  Endocrine: Negative.   Genitourinary: Negative.   Musculoskeletal: Negative.   Neurological: Negative.  Negative for headaches.  Hematological: Negative.   Psychiatric/Behavioral: The patient is nervous/anxious and has insomnia.   All other systems reviewed and are negative.      Objective:   Physical Exam  Constitutional: She is oriented to person, place, and time. She appears well-developed and well-nourished. No distress.  HENT:  Head: Normocephalic and atraumatic.  Right Ear: External ear normal.  Mouth/Throat: Oropharynx is clear and moist.  Eyes: Pupils are equal, round, and reactive to light.  Neck: Normal range of motion. Neck supple. No thyromegaly present.  Cardiovascular: Normal rate, regular rhythm, normal heart sounds and intact distal pulses.   No murmur heard. Pulmonary/Chest: Effort normal and breath sounds normal. No respiratory distress. She has no wheezes. Right breast exhibits no inverted nipple, no mass, no nipple discharge, no skin change and no tenderness. Left breast exhibits no inverted nipple, no mass, no nipple discharge, no skin change and no tenderness. Breasts are symmetrical.  Abdominal: Soft. Bowel sounds  are normal. She exhibits no distension. There is no  tenderness.  Genitourinary: Vagina normal.  Bimanual exam- no adnexal masses or tenderness, ovaries nonpalpable   Cervix not present- No discharge   Musculoskeletal: Normal range of motion. She exhibits no edema or tenderness.  Neurological: She is alert and oriented to person, place, and time. She has normal reflexes. No cranial nerve deficit.  Skin: Skin is warm and dry.  Psychiatric: She has a normal mood and affect. Her behavior is normal. Judgment and thought content normal.  Vitals reviewed.    BP 117/79 mmHg  Pulse 100  Temp(Src) 97.1 F (36.2 C) (Oral)  Ht 5' 3"  (1.6 m)  Wt 151 lb (68.493 kg)  BMI 26.76 kg/m2      Assessment & Plan:  1. Encounter for routine gynecological examination - POCT UA - Microscopic Only - POCT urinalysis dipstick - Pap IG w/ reflex to HPV when ASC-U  2. Asthma, chronic, moderate persistent, uncomplicated  3. Irritable bowel syndrome with constipation - Ambulatory referral to Gastroenterology  4. Constipation, unspecified constipation type - Ambulatory referral to Gastroenterology  5. Gastroesophageal reflux disease, esophagitis presence not specified - Ambulatory referral to Gastroenterology  6. Osteoarthrosis involving lower leg  7. GAD (generalized anxiety disorder) -Pt started on lexapro 10 mg today -Stress management  discussed - escitalopram (LEXAPRO) 10 MG tablet; Take 1 tablet (10 mg total) by mouth daily.  Dispense: 90 tablet; Refill: 3  8. Depression -Pt started on lexapro 10 mg today -Stress management  Discussed - escitalopram (LEXAPRO) 10 MG tablet; Take 1 tablet (10 mg total) by mouth daily.  Dispense: 90 tablet; Refill: 3  9. Hyperglycemia  10. Dysphagia - Ambulatory referral to Gastroenterology  11. Laboratory tests ordered as part of a complete physical exam (CPE) - CMP14+EGFR - Thyroid Panel With TSH - VITAMIN D 25 Hydroxy (Vit-D Deficiency, Fractures) - Hepatitis C antibody - HIV antibody - Anemia  Profile B - Fecal occult blood, imunochemical; Future - Pap IG w/ reflex to HPV when ASC-U  12. Colon cancer screening - Fecal occult blood, imunochemical; Future  13. Need for hepatitis C screening test - Hepatitis C antibody  14. Screening for HIV (human immunodeficiency virus) - HIV antibody  15. Other fatigue - Thyroid Panel With TSH - VITAMIN D 25 Hydroxy (Vit-D Deficiency, Fractures) - Anemia Profile B  16. Post-menopausal - DG Bone Density; Future   Continue all meds Labs pending Health Maintenance reviewed Diet and exercise encouraged RTO 6 months   Evelina Dun, FNP

## 2015-05-01 NOTE — Patient Instructions (Signed)
Health Maintenance, Female Adopting a healthy lifestyle and getting preventive care can go a long way to promote health and wellness. Talk with your health care provider about what schedule of regular examinations is right for you. This is a good chance for you to check in with your provider about disease prevention and staying healthy. In between checkups, there are plenty of things you can do on your own. Experts have done a lot of research about which lifestyle changes and preventive measures are most likely to keep you healthy. Ask your health care provider for more information. WEIGHT AND DIET  Eat a healthy diet  Be sure to include plenty of vegetables, fruits, low-fat dairy products, and lean protein.  Do not eat a lot of foods high in solid fats, added sugars, or salt.  Get regular exercise. This is one of the most important things you can do for your health.  Most adults should exercise for at least 150 minutes each week. The exercise should increase your heart rate and make you sweat (moderate-intensity exercise).  Most adults should also do strengthening exercises at least twice a week. This is in addition to the moderate-intensity exercise.  Maintain a healthy weight  Body mass index (BMI) is a measurement that can be used to identify possible weight problems. It estimates body fat based on height and weight. Your health care provider can help determine your BMI and help you achieve or maintain a healthy weight.  For females 20 years of age and older:   A BMI below 18.5 is considered underweight.  A BMI of 18.5 to 24.9 is normal.  A BMI of 25 to 29.9 is considered overweight.  A BMI of 30 and above is considered obese.  Watch levels of cholesterol and blood lipids  You should start having your blood tested for lipids and cholesterol at 58 years of age, then have this test every 5 years.  You may need to have your cholesterol levels checked more often if:  Your lipid  or cholesterol levels are high.  You are older than 58 years of age.  You are at high risk for heart disease.  CANCER SCREENING   Lung Cancer  Lung cancer screening is recommended for adults 55-80 years old who are at high risk for lung cancer because of a history of smoking.  A yearly low-dose CT scan of the lungs is recommended for people who:  Currently smoke.  Have quit within the past 15 years.  Have at least a 30-pack-year history of smoking. A pack year is smoking an average of one pack of cigarettes a day for 1 year.  Yearly screening should continue until it has been 15 years since you quit.  Yearly screening should stop if you develop a health problem that would prevent you from having lung cancer treatment.  Breast Cancer  Practice breast self-awareness. This means understanding how your breasts normally appear and feel.  It also means doing regular breast self-exams. Let your health care provider know about any changes, no matter how small.  If you are in your 20s or 30s, you should have a clinical breast exam (CBE) by a health care provider every 1-3 years as part of a regular health exam.  If you are 40 or older, have a CBE every year. Also consider having a breast X-ray (mammogram) every year.  If you have a family history of breast cancer, talk to your health care provider about genetic screening.  If you   are at high risk for breast cancer, talk to your health care provider about having an MRI and a mammogram every year.  Breast cancer gene (BRCA) assessment is recommended for women who have family members with BRCA-related cancers. BRCA-related cancers include:  Breast.  Ovarian.  Tubal.  Peritoneal cancers.  Results of the assessment will determine the need for genetic counseling and BRCA1 and BRCA2 testing. Cervical Cancer Your health care provider may recommend that you be screened regularly for cancer of the pelvic organs (ovaries, uterus, and  vagina). This screening involves a pelvic examination, including checking for microscopic changes to the surface of your cervix (Pap test). You may be encouraged to have this screening done every 3 years, beginning at age 21.  For women ages 30-65, health care providers may recommend pelvic exams and Pap testing every 3 years, or they may recommend the Pap and pelvic exam, combined with testing for human papilloma virus (HPV), every 5 years. Some types of HPV increase your risk of cervical cancer. Testing for HPV may also be done on women of any age with unclear Pap test results.  Other health care providers may not recommend any screening for nonpregnant women who are considered low risk for pelvic cancer and who do not have symptoms. Ask your health care provider if a screening pelvic exam is right for you.  If you have had past treatment for cervical cancer or a condition that could lead to cancer, you need Pap tests and screening for cancer for at least 20 years after your treatment. If Pap tests have been discontinued, your risk factors (such as having a new sexual partner) need to be reassessed to determine if screening should resume. Some women have medical problems that increase the chance of getting cervical cancer. In these cases, your health care provider may recommend more frequent screening and Pap tests. Colorectal Cancer  This type of cancer can be detected and often prevented.  Routine colorectal cancer screening usually begins at 58 years of age and continues through 58 years of age.  Your health care provider may recommend screening at an earlier age if you have risk factors for colon cancer.  Your health care provider may also recommend using home test kits to check for hidden blood in the stool.  A small camera at the end of a tube can be used to examine your colon directly (sigmoidoscopy or colonoscopy). This is done to check for the earliest forms of colorectal  cancer.  Routine screening usually begins at age 50.  Direct examination of the colon should be repeated every 5-10 years through 58 years of age. However, you may need to be screened more often if early forms of precancerous polyps or small growths are found. Skin Cancer  Check your skin from head to toe regularly.  Tell your health care provider about any new moles or changes in moles, especially if there is a change in a mole's shape or color.  Also tell your health care provider if you have a mole that is larger than the size of a pencil eraser.  Always use sunscreen. Apply sunscreen liberally and repeatedly throughout the day.  Protect yourself by wearing long sleeves, pants, a wide-brimmed hat, and sunglasses whenever you are outside. HEART DISEASE, DIABETES, AND HIGH BLOOD PRESSURE   High blood pressure causes heart disease and increases the risk of stroke. High blood pressure is more likely to develop in:  People who have blood pressure in the high end   of the normal range (130-139/85-89 mm Hg).  People who are overweight or obese.  People who are African American.  If you are 38-23 years of age, have your blood pressure checked every 3-5 years. If you are 61 years of age or older, have your blood pressure checked every year. You should have your blood pressure measured twice--once when you are at a hospital or clinic, and once when you are not at a hospital or clinic. Record the average of the two measurements. To check your blood pressure when you are not at a hospital or clinic, you can use:  An automated blood pressure machine at a pharmacy.  A home blood pressure monitor.  If you are between 45 years and 39 years old, ask your health care provider if you should take aspirin to prevent strokes.  Have regular diabetes screenings. This involves taking a blood sample to check your fasting blood sugar level.  If you are at a normal weight and have a low risk for diabetes,  have this test once every three years after 58 years of age.  If you are overweight and have a high risk for diabetes, consider being tested at a younger age or more often. PREVENTING INFECTION  Hepatitis B  If you have a higher risk for hepatitis B, you should be screened for this virus. You are considered at high risk for hepatitis B if:  You were born in a country where hepatitis B is common. Ask your health care provider which countries are considered high risk.  Your parents were born in a high-risk country, and you have not been immunized against hepatitis B (hepatitis B vaccine).  You have HIV or AIDS.  You use needles to inject street drugs.  You live with someone who has hepatitis B.  You have had sex with someone who has hepatitis B.  You get hemodialysis treatment.  You take certain medicines for conditions, including cancer, organ transplantation, and autoimmune conditions. Hepatitis C  Blood testing is recommended for:  Everyone born from [birth year] through 1963.  Anyone with known risk factors for hepatitis C. Sexually transmitted infections (STIs)  You should be screened for sexually transmitted infections (STIs) including gonorrhea and chlamydia if:  You are sexually active and are younger than 58 years of age.  You are older than 58 years of age and your health care provider tells you that you are at risk for this type of infection.  Your sexual activity has changed since you were last screened and you are at an increased risk for chlamydia or gonorrhea. Ask your health care provider if you are at risk.  If you do not have HIV, but are at risk, it may be recommended that you take a prescription medicine daily to prevent HIV infection. This is called pre-exposure prophylaxis (PrEP). You are considered at risk if:  You are sexually active and do not regularly use condoms or know the HIV status of your partner(s).  You take drugs by injection.  You are sexually  active with a partner who has HIV. Talk with your health care provider about whether you are at high risk of being infected with HIV. If you choose to begin PrEP, you should first be tested for HIV. You should then be tested every 3 months for as long as you are taking PrEP.  PREGNANCY   If you are premenopausal and you may become pregnant, ask your health care provider about preconception counseling.  If you may  become pregnant, take 400 to 800 micrograms (mcg) of folic acid every day.  If you want to prevent pregnancy, talk to your health care provider about birth control (contraception). OSTEOPOROSIS AND MENOPAUSE   Osteoporosis is a disease in which the bones lose minerals and strength with aging. This can result in serious bone fractures. Your risk for osteoporosis can be identified using a bone density scan.  If you are 61 years of age or older, or if you are at risk for osteoporosis and fractures, ask your health care provider if you should be screened.  Ask your health care provider whether you should take a calcium or vitamin D supplement to lower your risk for osteoporosis.  Menopause may have certain physical symptoms and risks.  Hormone replacement therapy may reduce some of these symptoms and risks. Talk to your health care provider about whether hormone replacement therapy is right for you.  HOME CARE INSTRUCTIONS   Schedule regular health, dental, and eye exams.  Stay current with your immunizations.   Do not use any tobacco products including cigarettes, chewing tobacco, or electronic cigarettes.  If you are pregnant, do not drink alcohol.  If you are breastfeeding, limit how much and how often you drink alcohol.  Limit alcohol intake to no more than 1 drink per day for nonpregnant women. One drink equals 12 ounces of beer, 5 ounces of wine, or 1 ounces of hard liquor.  Do not use street drugs.  Do not share needles.  Ask your health care provider for help if  you need support or information about quitting drugs.  Tell your health care provider if you often feel depressed.  Tell your health care provider if you have ever been abused or do not feel safe at home.   This information is not intended to replace advice given to you by your health care provider. Make sure you discuss any questions you have with your health care provider.   Document Released: 08/31/2010 Document Revised: 03/08/2014 Document Reviewed: 01/17/2013 Elsevier Interactive Patient Education [employer].

## 2015-05-02 ENCOUNTER — Encounter (INDEPENDENT_AMBULATORY_CARE_PROVIDER_SITE_OTHER): Payer: Self-pay | Admitting: *Deleted

## 2015-05-02 LAB — HIV ANTIBODY (ROUTINE TESTING W REFLEX): HIV Screen 4th Generation wRfx: NONREACTIVE

## 2015-05-02 LAB — ANEMIA PROFILE B
BASOS ABS: 0 10*3/uL (ref 0.0–0.2)
BASOS: 0 %
EOS (ABSOLUTE): 0.1 10*3/uL (ref 0.0–0.4)
Eos: 2 %
FERRITIN: 43 ng/mL (ref 15–150)
Folate: >20 ng/mL (ref >3.0)
Hematocrit: 39.1 % (ref 34.0–46.6)
Hemoglobin: 13.2 g/dL (ref 11.1–15.9)
IMMATURE GRANS (ABS): 0 10*3/uL (ref 0.0–0.1)
IMMATURE GRANULOCYTES: 0 %
Iron Saturation: 17 % (ref 15–55)
Iron: 65 ug/dL (ref 27–159)
Lymphocytes Absolute: 1.6 10*3/uL (ref 0.7–3.1)
Lymphs: 31 %
MCH: 27.9 pg (ref 26.6–33.0)
MCHC: 33.8 g/dL (ref 31.5–35.7)
MCV: 83 fL (ref 79–97)
Monocytes Absolute: 0.3 10*3/uL (ref 0.1–0.9)
Monocytes: 6 %
NEUTROS PCT: 61 %
Neutrophils Absolute: 3.1 10*3/uL (ref 1.4–7.0)
PLATELETS: 165 10*3/uL (ref 150–379)
RBC: 4.73 x10E6/uL (ref 3.77–5.28)
RDW: 14.6 % (ref 12.3–15.4)
RETIC CT PCT: 0.8 % (ref 0.6–2.6)
Total Iron Binding Capacity: 372 ug/dL (ref 250–450)
UIBC: 307 ug/dL (ref 131–425)
Vitamin B-12: 1004 pg/mL — ABNORMAL HIGH (ref 211–946)
WBC: 5.1 10*3/uL (ref 3.4–10.8)

## 2015-05-02 LAB — CMP14+EGFR
ALBUMIN: 3.9 g/dL (ref 3.5–5.5)
ALK PHOS: 101 IU/L (ref 39–117)
ALT: 83 IU/L — ABNORMAL HIGH (ref 0–32)
AST: 88 IU/L — AB (ref 0–40)
Albumin/Globulin Ratio: 1.3 (ref 1.1–2.5)
BUN / CREAT RATIO: 9 (ref 9–23)
BUN: 7 mg/dL (ref 6–24)
Bilirubin Total: 1.1 mg/dL (ref 0.0–1.2)
CO2: 25 mmol/L (ref 18–29)
CREATININE: 0.8 mg/dL (ref 0.57–1.00)
Calcium: 8.9 mg/dL (ref 8.7–10.2)
Chloride: 101 mmol/L (ref 96–106)
GFR calc Af Amer: 95 mL/min/{1.73_m2} (ref >59)
GFR calc non Af Amer: 82 mL/min/{1.73_m2} (ref >59)
GLUCOSE: 94 mg/dL (ref 65–99)
Globulin, Total: 3 g/dL (ref 1.5–4.5)
Potassium: 4.1 mmol/L (ref 3.5–5.2)
Sodium: 140 mmol/L (ref 134–144)
Total Protein: 6.9 g/dL (ref 6.0–8.5)

## 2015-05-02 LAB — THYROID PANEL WITH TSH
FREE THYROXINE INDEX: 2.1 (ref 1.2–4.9)
T3 UPTAKE RATIO: 19 % — AB (ref 24–39)
T4 TOTAL: 11.1 ug/dL (ref 4.5–12.0)
TSH: 2.16 u[IU]/mL (ref 0.450–4.500)

## 2015-05-02 LAB — HEPATITIS C ANTIBODY: Hep C Virus Ab: >11 s/co ratio — ABNORMAL HIGH (ref 0.0–0.9)

## 2015-05-02 LAB — VITAMIN D 25 HYDROXY (VIT D DEFICIENCY, FRACTURES): VIT D 25 HYDROXY: 30.4 ng/mL (ref 30.0–100.0)

## 2015-05-04 LAB — PAP IG W/ RFLX HPV ASCU: PAP Smear Comment: 0

## 2015-05-05 ENCOUNTER — Other Ambulatory Visit: Payer: Self-pay | Admitting: Family

## 2015-05-05 ENCOUNTER — Other Ambulatory Visit: Payer: Medicare Other

## 2015-05-05 DIAGNOSIS — R768 Other specified abnormal immunological findings in serum: Secondary | ICD-10-CM

## 2015-05-05 DIAGNOSIS — R894 Abnormal immunological findings in specimens from other organs, systems and tissues: Secondary | ICD-10-CM | POA: Diagnosis not present

## 2015-05-05 DIAGNOSIS — B1920 Unspecified viral hepatitis C without hepatic coma: Secondary | ICD-10-CM | POA: Diagnosis not present

## 2015-05-05 MED ORDER — SERTRALINE HCL 50 MG PO TABS: 50.0000 mg | ORAL_TABLET | Freq: Every day | ORAL | Status: DC

## 2015-05-05 NOTE — Addendum Note (Signed)
Addended by: Evelina Dun A on: 05/05/2015 11:23 AM   Modules accepted: Orders, SmartSet

## 2015-05-05 NOTE — Telephone Encounter (Signed)
Prescription sent to pharmacy.

## 2015-05-06 LAB — COMMENT2 - HEP PANEL

## 2015-05-06 LAB — HEPATITIS C ANTIBODY (REFLEX): HCV Ab: >11 s/co ratio — ABNORMAL HIGH (ref 0.0–0.9)

## 2015-05-06 LAB — SPECIMEN STATUS REPORT

## 2015-05-06 NOTE — Telephone Encounter (Signed)
Pt aware. rx has been sent to the pharmacy 

## 2015-05-07 ENCOUNTER — Other Ambulatory Visit: Payer: Self-pay | Admitting: Family Medicine

## 2015-05-08 ENCOUNTER — Other Ambulatory Visit: Payer: Medicare Other

## 2015-05-08 ENCOUNTER — Other Ambulatory Visit: Payer: Self-pay | Admitting: Family Medicine

## 2015-05-08 ENCOUNTER — Other Ambulatory Visit: Payer: Self-pay | Admitting: Family

## 2015-05-08 DIAGNOSIS — Z0000 Encounter for general adult medical examination without abnormal findings: Secondary | ICD-10-CM

## 2015-05-08 DIAGNOSIS — M545 Low back pain: Secondary | ICD-10-CM | POA: Diagnosis not present

## 2015-05-08 DIAGNOSIS — Z1211 Encounter for screening for malignant neoplasm of colon: Secondary | ICD-10-CM | POA: Diagnosis not present

## 2015-05-08 DIAGNOSIS — M5416 Radiculopathy, lumbar region: Secondary | ICD-10-CM | POA: Diagnosis not present

## 2015-05-08 LAB — HCV RT-PCR, QUANT (NON-GRAPH)
HCV LOG10: 6.714 {Log_IU}/mL
HEPATITIS C QUANTITATION: 5180000 [IU]/mL

## 2015-05-08 LAB — HCV AB W REFLEX TO QUANT PCR: HCV Ab: >11 s/co ratio — ABNORMAL HIGH (ref 0.0–0.9)

## 2015-05-08 MED ORDER — PERMETHRIN 5 % EX CREA: 1.0000 "application " | TOPICAL_CREAM | Freq: Once | CUTANEOUS | Status: DC

## 2015-05-08 NOTE — Telephone Encounter (Signed)
done

## 2015-05-08 NOTE — Telephone Encounter (Signed)
lmovm that Rx was sent to pharmacy 

## 2015-05-08 NOTE — Telephone Encounter (Signed)
Prescription sent to pharmacy.

## 2015-05-09 NOTE — Telephone Encounter (Signed)
Left message- rx requested sent to the pharmacy.  

## 2015-05-10 LAB — FECAL OCCULT BLOOD, IMMUNOCHEMICAL: Fecal Occult Bld: NEGATIVE

## 2015-05-14 ENCOUNTER — Other Ambulatory Visit: Payer: Self-pay | Admitting: Family

## 2015-05-14 DIAGNOSIS — R768 Other specified abnormal immunological findings in serum: Secondary | ICD-10-CM

## 2015-05-15 ENCOUNTER — Encounter (HOSPITAL_COMMUNITY): Payer: Self-pay | Admitting: Emergency Medicine

## 2015-05-15 ENCOUNTER — Telehealth: Payer: Self-pay | Admitting: Family Medicine

## 2015-05-15 ENCOUNTER — Emergency Department (HOSPITAL_COMMUNITY): Payer: Medicare Other

## 2015-05-15 ENCOUNTER — Emergency Department (HOSPITAL_COMMUNITY)
Admission: EM | Admit: 2015-05-15 | Discharge: 2015-05-15 | Disposition: A | Discharge status: Home / Self Care | Payer: Medicare Other | Attending: Emergency Medicine | Admitting: Emergency Medicine

## 2015-05-15 DIAGNOSIS — J45909 Unspecified asthma, uncomplicated: Secondary | ICD-10-CM | POA: Insufficient documentation

## 2015-05-15 DIAGNOSIS — Z79899 Other long term (current) drug therapy: Secondary | ICD-10-CM | POA: Insufficient documentation

## 2015-05-15 DIAGNOSIS — Z87891 Personal history of nicotine dependence: Secondary | ICD-10-CM | POA: Diagnosis not present

## 2015-05-15 DIAGNOSIS — R1011 Right upper quadrant pain: Secondary | ICD-10-CM

## 2015-05-15 LAB — CBC WITH DIFFERENTIAL/PLATELET
Basophils Absolute: 0 10*3/uL (ref 0.0–0.1)
Basophils Relative: 0 %
Eosinophils Absolute: 0.1 10*3/uL (ref 0.0–0.7)
Eosinophils Relative: 2 %
HCT: 40.1 % (ref 36.0–46.0)
Hemoglobin: 13.3 g/dL (ref 12.0–15.0)
Lymphocytes Relative: 31 %
Lymphs Abs: 1.9 10*3/uL (ref 0.7–4.0)
MCH: 27.7 pg (ref 26.0–34.0)
MCHC: 33.2 g/dL (ref 30.0–36.0)
MCV: 83.5 fL (ref 78.0–100.0)
Monocytes Absolute: 0.5 10*3/uL (ref 0.1–1.0)
Monocytes Relative: 9 %
Neutro Abs: 3.6 10*3/uL (ref 1.7–7.7)
Neutrophils Relative %: 58 %
Platelets: 178 10*3/uL (ref 150–400)
RBC: 4.8 MIL/uL (ref 3.87–5.11)
RDW: 14 % (ref 11.5–15.5)
WBC: 6.2 10*3/uL (ref 4.0–10.5)

## 2015-05-15 LAB — COMPREHENSIVE METABOLIC PANEL
ALT: 73 U/L — ABNORMAL HIGH (ref 14–54)
AST: 75 U/L — ABNORMAL HIGH (ref 15–41)
Albumin: 3.9 g/dL (ref 3.5–5.0)
Alkaline Phosphatase: 89 U/L (ref 38–126)
Anion gap: 5 (ref 5–15)
BUN: 10 mg/dL (ref 6–20)
CO2: 30 mmol/L (ref 22–32)
Calcium: 8.7 mg/dL — ABNORMAL LOW (ref 8.9–10.3)
Chloride: 105 mmol/L (ref 101–111)
Creatinine, Ser: 0.7 mg/dL (ref 0.44–1.00)
GFR calc Af Amer: >60 mL/min (ref >60)
GFR calc non Af Amer: >60 mL/min (ref >60)
Glucose, Bld: 83 mg/dL (ref 65–99)
Potassium: 4.1 mmol/L (ref 3.5–5.1)
Sodium: 140 mmol/L (ref 135–145)
Total Bilirubin: 1 mg/dL (ref 0.3–1.2)
Total Protein: 7.1 g/dL (ref 6.5–8.1)

## 2015-05-15 LAB — LIPASE, BLOOD: Lipase: 53 U/L — ABNORMAL HIGH (ref 11–51)

## 2015-05-15 LAB — URINALYSIS, ROUTINE W REFLEX MICROSCOPIC
Bilirubin Urine: NEGATIVE
Glucose, UA: NEGATIVE mg/dL
Hgb urine dipstick: NEGATIVE
Ketones, ur: NEGATIVE mg/dL
Leukocytes, UA: NEGATIVE
Nitrite: NEGATIVE
Protein, ur: NEGATIVE mg/dL
Specific Gravity, Urine: <1.005 — ABNORMAL LOW (ref 1.005–1.030)
pH: 5.5 (ref 5.0–8.0)

## 2015-05-15 MED ORDER — DICYCLOMINE HCL 20 MG PO TABS: 20.0000 mg | ORAL_TABLET | Freq: Four times a day (QID) | ORAL | Status: DC | PRN

## 2015-05-15 MED ORDER — ONDANSETRON HCL 4 MG/2ML IJ SOLN
4.0000 mg | Freq: Once | INTRAMUSCULAR | Status: AC
  Administered 2015-05-15: 4 mg via INTRAVENOUS
  Filled 2015-05-15: qty 2

## 2015-05-15 MED ORDER — SODIUM CHLORIDE 0.9 % IV BOLUS (SEPSIS)
1000.0000 mL | Freq: Once | INTRAVENOUS | Status: AC
  Administered 2015-05-15: 1000 mL via INTRAVENOUS

## 2015-05-15 MED ORDER — HYDROMORPHONE HCL 1 MG/ML IJ SOLN
1.0000 mg | Freq: Once | INTRAMUSCULAR | Status: AC
  Administered 2015-05-15: 1 mg via INTRAVENOUS
  Filled 2015-05-15: qty 1

## 2015-05-15 MED ORDER — IOHEXOL 300 MG/ML  SOLN
50.0000 mL | Freq: Once | INTRAMUSCULAR | Status: AC | PRN
  Administered 2015-05-15: 50 mL via ORAL

## 2015-05-15 MED ORDER — IOHEXOL 300 MG/ML  SOLN
100.0000 mL | Freq: Once | INTRAMUSCULAR | Status: AC | PRN
  Administered 2015-05-15: 100 mL via INTRAVENOUS

## 2015-05-15 NOTE — Discharge Instructions (Signed)

## 2015-05-15 NOTE — ED Notes (Signed)
Pt states she has been having right upper quad pain for a few months intermittently that has increased in frequency with nausea.  States pain used to be every once in a while and now is almost daily.

## 2015-05-15 NOTE — Telephone Encounter (Signed)
Pt states she is having RLQ pain that is an 8 on a 1-10 pain scale with 10 being the worst pain with nausea. Pt was advised to go to the ER. Pt voiced understanding.

## 2015-05-19 ENCOUNTER — Ambulatory Visit (INDEPENDENT_AMBULATORY_CARE_PROVIDER_SITE_OTHER): Payer: Medicare Other | Admitting: Internal Medicine

## 2015-05-19 ENCOUNTER — Encounter (INDEPENDENT_AMBULATORY_CARE_PROVIDER_SITE_OTHER): Payer: Self-pay | Admitting: Internal Medicine

## 2015-05-19 ENCOUNTER — Encounter (INDEPENDENT_AMBULATORY_CARE_PROVIDER_SITE_OTHER): Payer: Self-pay | Admitting: *Deleted

## 2015-05-19 VITALS — BP 128/82 | HR 72 | Temp 98.0°F | Ht 62.0 in | Wt 154.1 lb

## 2015-05-19 DIAGNOSIS — B1710 Acute hepatitis C without hepatic coma: Secondary | ICD-10-CM

## 2015-05-19 DIAGNOSIS — R1319 Other dysphagia: Secondary | ICD-10-CM | POA: Diagnosis not present

## 2015-05-19 DIAGNOSIS — F1921 Other psychoactive substance dependence, in remission: Secondary | ICD-10-CM | POA: Diagnosis not present

## 2015-05-19 DIAGNOSIS — B1920 Unspecified viral hepatitis C without hepatic coma: Secondary | ICD-10-CM | POA: Insufficient documentation

## 2015-05-19 DIAGNOSIS — B182 Chronic viral hepatitis C: Secondary | ICD-10-CM | POA: Diagnosis not present

## 2015-05-19 LAB — PROTIME-INR
INR: 1 (ref <1.50)
PROTHROMBIN TIME: 13.3 s (ref 11.6–15.2)

## 2015-05-19 NOTE — Patient Instructions (Signed)
Labs today for Hep C Will schedule a DG esphagram.

## 2015-05-19 NOTE — Progress Notes (Signed)
Subjective:    Patient ID: Teresa [name], female    DOB: 03/18/1955, 60 y.o.   MRN: [id number]  HPI Referred by Evelina Dun PA.  C/o dysphagia.  She tells me she has had dysphagia for about a months which has progressively worsened. Any foods feels like they are lodging. She tells me even liquids feel like they are lodging. She says her acid reflux is controlled with Dexilant. She has been on [place] for over a year. Hx of EGD/ED in the past by Dr Henrene Pastor at [facility] in 2015. She has had multiple EGD/ED in the past.  Recently tested positive for Hepatitis C in March of this year. Hx of IV drugs abuse 23 yrs ago. No tattoos.  Hx of depression. No suicidal thoughts. No etoh abuse in 23 yrs. No blood transfusions.  Her appetite is good. No weight loss. Sometimes she will have a BM x 2-3 a day. No melena or BRRB.  05/05/2015 Hep C antibody: positive 05/05/2015 Hep C quaint: [id number]  Hep C log: 6.718  07/18/2013 EGD/ED: Dr. Henrene Pastor, dysphagia: Normal EGD, Status post [place] dilation for dysphagia.[facility] dilator passed into the esophagus without resistance or heme).   7/156/2015 Colonoscopy with snare polypectomy: Seven polyps ranging between 3-7 mm in size were found in the colon; polypectomy was performed with a cold snare. Moderate diverticulosis in left colon. Colon mucosa was normal. All polyps were tubular adenomas. No high grade dysplasia.   05/15/2015 CT abdomen/pelvis with CM: rt upper quadrant pain. Diffuse hepatic steatosis.  IMPRESSION: No acute pathology. Bladder distention is noted. CMP Latest Ref Rng 05/15/2015 05/01/2015 08/07/2014  Glucose 65 - 99 mg/dL 83 94 119(H)  BUN 6 - 20 mg/dL 10 7 6   Creatinine 0.44 - 1.00 mg/dL 0.70 0.80 0.68  Sodium 135 - 145 mmol/L 140 140 142  Potassium 3.5 - 5.1 mmol/L 4.1 4.1 3.6  Chloride 101 - 111 mmol/L 105 101 106  CO2 22 - 32 mmol/L 30 25 26   Calcium 8.9 - 10.3 mg/dL 8.7(L) 8.9 8.7(L)  Total Protein 6.5 - 8.1 g/dL 7.1 6.9 7.1    Total Bilirubin 0.3 - 1.2 mg/dL 1.0 1.1 0.8  Alkaline Phos 38 - 126 U/L 89 101 72  AST 15 - 41 U/L 75(H) 88(H) 28  ALT 14 - 54 U/L 73(H) 83(H) 28   CBC    Component Value Date/Time   WBC 6.2 05/15/2015 1900   WBC 5.1 05/01/2015 1311   WBC 7.4 06/08/2014 1137   RBC 4.80 05/15/2015 1900   RBC 4.73 05/01/2015 1311   RBC 5.48 06/08/2014 1137   HGB 13.3 05/15/2015 1900   HGB 13.7 06/08/2014 1137   HCT 40.1 05/15/2015 1900   HCT 39.1 05/01/2015 1311   HCT 44.7 06/08/2014 1137   PLT 178 05/15/2015 1900   PLT 165 05/01/2015 1311   MCV 83.5 05/15/2015 1900   MCV 83 05/01/2015 1311   MCV 81.5 06/08/2014 1137   MCH 27.7 05/15/2015 1900   MCH 27.9 05/01/2015 1311   MCH 25.0* 06/08/2014 1137   MCHC 33.2 05/15/2015 1900   MCHC 33.8 05/01/2015 1311   MCHC 30.6* 06/08/2014 1137   RDW 14.0 05/15/2015 1900   RDW 14.6 05/01/2015 1311   LYMPHSABS 1.9 05/15/2015 1900   LYMPHSABS 1.6 05/01/2015 1311   MONOABS 0.5 05/15/2015 1900   EOSABS 0.1 05/15/2015 1900   EOSABS 0.1 05/01/2015 1311   BASOSABS 0.0 05/15/2015 1900   BASOSABS 0.0 05/01/2015 1311  Review of Systems Past Medical History  Diagnosis Date  . Diverticulosis 07-08-2005    colonoscopy  . Hiatal hernia 07-08-2005    EGD  . GERD (gastroesophageal reflux disease) 07-08-2005    EGD  . Irritable bowel syndrome   . Vocal cord polyp     history of  . Degeneration of intervertebral disc, site unspecified   . Osteoarthrosis, unspecified whether generalized or localized, lower leg   . Depressive disorder, not elsewhere classified   . Anxiety state, unspecified   . Full dentures   . Seizures ([place]) 1990s    x 1, unknown cause; no seizures since  . Chronic back pain greater than 3 months duration   . Seasonal allergies   . Papilloma of breast 09/2011    left  . Asthma     prn inhaler  . Esophageal stricture 2014  . Insomnia   . Muscle spasm   . Migraine   . Diverticulitis   . Colon polyp     Past Surgical  History  Procedure Laterality Date  . Cholecystectomy    . Bladder surgery      bladder tack  . 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 Cystoscopy  08/24/2011    Procedure: CYSTOSCOPY;  Surgeon: Reece Packer, MD;  Location: [facility];  Service: Urology;  Laterality: N/A;  Cystoscopy,  Rectocele Repair and Vault Prolapse Repair  . Rectocele repair  08/24/2011    Procedure: POSTERIOR REPAIR (RECTOCELE);  Surgeon: Reece Packer, MD;  Location: [facility];  Service: Urology;  Laterality: N/A;  . Vaginal prolapse repair  08/24/2011    Procedure: VAGINAL VAULT SUSPENSION;  Surgeon: Reece Packer, MD;  Location: [facility];  Service: Urology;  Laterality: N/A;  . Abdominal hysterectomy      partial  . Bilateral salpingoophorectomy    . Other surgical history      exc. vocal cord polyp  . Breast biopsy  11/03/2011    Procedure: BREAST BIOPSY WITH NEEDLE LOCALIZATION;  Surgeon: Harl Bowie, MD;  Location: [place];  Service: General;  Laterality: Left;  needle localized left breast biopsy  . Colonoscopy  07/08/2005    562.10  . Esophagogastroduodenoscopy  10/03/2012    multiple   . Tumor removal      benign  . Rectocelle repair      Allergies  Allergen Reactions  . Lyrica [Pregabalin] Shortness Of Breath  . Trazodone And Nefazodone Shortness Of Breath  . Amitriptyline   . Arthrotec [Diclofenac-Misoprostol] Other (See Comments)    Upset stomach  . Keflex [Cephalexin] Other (See Comments)    Unknown reaction  . Naproxen Other (See Comments)    Irritates stomach  . Prozac [Fluoxetine Hcl] Other (See Comments)    "makes me feel bad"  . Ambien [Zolpidem Tartrate] Other (See Comments)    COULDN'T FUNCTION, STAYED SLEEPY    Current Outpatient Prescriptions on File Prior to Visit  Medication Sig Dispense Refill  . albuterol (PROAIR HFA) 108 (90 BASE) MCG/ACT inhaler Inhale 2 puffs into the lungs 4 (four) times daily as needed for wheezing or shortness of breath.  3 Inhaler 1  . ALPRAZolam (XANAX) 0.25 MG tablet Take 0.25 mg by mouth. Reported on 05/01/2015    . budesonide-formoterol (SYMBICORT) 160-4.5 MCG/ACT inhaler Inhale 2 puffs into the lungs 2 (two) times daily. 1 Inhaler 11  . cetirizine (ZYRTEC) 10 MG tablet TAKE ONE TABLET EVERY MORNING 30 tablet 11  . cyclobenzaprine (FLEXERIL) 10  MG tablet Take 10 mg by mouth every 6 (six) hours as needed for muscle spasms.    . DEXILANT 60 MG capsule TAKE ONE (1) CAPSULE EACH DAY 90 capsule 0  . diclofenac sodium (VOLTAREN) 1 % GEL Apply topically 4 (four) times daily.    Marland Kitchen dicyclomine (BENTYL) 20 MG tablet Take 1 tablet (20 mg total) by mouth every 6 (six) hours as needed for spasms. 12 tablet 0  . docusate sodium (COLACE) 100 MG capsule Take 100-200 mg by mouth daily.     . fluocinonide-emollient (LIDEX-E) 0.05 % cream Apply 1 application topically 2 (two) times daily. 60 g 5  . gabapentin (NEURONTIN) 300 MG capsule     . LYRICA 75 MG capsule Reported on 05/01/2015    . MAGNESIUM PO Take 1 tablet by mouth daily.     . mometasone (NASONEX) 50 MCG/ACT nasal spray Place 2 sprays into the nose daily. 17 g 12  . montelukast (SINGULAIR) 10 MG tablet TAKE ONE (1) TABLET EACH DAY 90 tablet 0  . permethrin (ELIMITE) 5 % cream Apply 1 application topically once. 60 g 0  . polyethylene glycol powder (GLYCOLAX/MIRALAX) powder Take 17 g by mouth 2 (two) times daily as needed. 3350 g 1  . Probiotic Product (PROBIOTIC DAILY PO) Take by mouth.    . sertraline (ZOLOFT) 50 MG tablet Take 1 tablet (50 mg total) by mouth daily. 90 tablet 1  . traMADol (ULTRAM) 50 MG tablet Take 1 tablet (50 mg total) by mouth every 6 (six) hours as needed. 45 tablet 2  . HYDROcodone-acetaminophen (NORCO/VICODIN) 5-325 MG tablet Take 1 tablet by mouth every 6 (six) hours as needed for moderate pain. Reported on 05/19/2015     No current facility-administered medications on file prior to visit.        Objective:   Physical Exam Blood pressure  128/82, pulse 72, temperature 98 F (36.7 C), height 5\' 2"  (1.575 m), weight 154 lb 1.6 oz (69.899 kg).  Alert and oriented. Skin warm and dry. Oral mucosa is moist.   . Sclera anicteric, conjunctivae is pink. Thyroid not enlarged. No cervical lymphadenopathy. Lungs clear. Heart regular rate and rhythm.  Abdomen is soft. Bowel sounds are positive. No hepatomegaly. No abdominal masses felt. No tenderness.  No edema to lower extremities.         Assessment & Plan:  Dysphagia. Hx of EGD/ED in the past. Am going to get an Esphagram. If she needs an EGD/ED needs to be with propofol.  Hep C: Will start work up. Hep C quaint, Hep C genotype, PT/INR, [place] elastrography.  Urine drug screening  OV pending.

## 2015-05-20 LAB — DRUGS OF ABUSE SCREEN W/O ALC, ROUTINE URINE
Amphetamine Screen, Ur: NEGATIVE
BARBITURATE QUANT UR: NEGATIVE
Benzodiazepines.: NEGATIVE
COCAINE METABOLITES: NEGATIVE
Creatinine,U: 52.7 mg/dL
MARIJUANA METABOLITE: NEGATIVE
Methadone: NEGATIVE
OPIATE SCREEN, URINE: NEGATIVE
PHENCYCLIDINE (PCP): NEGATIVE
Propoxyphene: NEGATIVE

## 2015-05-21 LAB — HEPATITIS C RNA QUANTITATIVE
HCV QUANT LOG: 6.85 {Log} — AB (ref <1.18)
HCV QUANT: 7026607 [IU]/mL — AB (ref <15)

## 2015-05-21 NOTE — ED Provider Notes (Signed)
CSN: [id number]     Arrival date & time 05/15/15  1809 History   First MD Initiated Contact with Patient 05/15/15 1843     Chief Complaint  Patient presents with  . Abdominal Pain     (Consider location/radiation/quality/duration/timing/severity/associated sxs/prior Treatment) HPI   60 year old female with abdominal pain. Right upper quadrant. Patient has had this same pain intermittently over the past several months. Is becoming more frequent and more intense. No appreciable exacerbating factors. Associated with nausea. No vomiting. No fevers or chills. No urinary complaints. She is status post cholecystectomy.  Past Medical History  Diagnosis Date  . Diverticulosis 07-08-2005    colonoscopy  . Hiatal hernia 07-08-2005    EGD  . GERD (gastroesophageal reflux disease) 07-08-2005    EGD  . Irritable bowel syndrome   . Vocal cord polyp     history of  . Degeneration of intervertebral disc, site unspecified   . Osteoarthrosis, unspecified whether generalized or localized, lower leg   . Depressive disorder, not elsewhere classified   . Anxiety state, unspecified   . Full dentures   . Seizures ([place]) 1990s    x 1, unknown cause; no seizures since  . Chronic back pain greater than 3 months duration   . Seasonal allergies   . Papilloma of breast 09/2011    left  . Asthma     prn inhaler  . Esophageal stricture 2014  . Insomnia   . Muscle spasm   . Migraine   . Diverticulitis   . Colon polyp    Past Surgical History  Procedure Laterality Date  . Cholecystectomy    . Bladder surgery      bladder tack  . 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 Cystoscopy  08/24/2011    Procedure: CYSTOSCOPY;  Surgeon: Reece Packer, MD;  [facility];  Service: Urology;  Laterality: N/A;  Cystoscopy,  Rectocele Repair and Vault Prolapse Repair  . Rectocele repair  08/24/2011    Procedure: POSTERIOR REPAIR (RECTOCELE);  Surgeon: Reece Packer, MD;  [facility];  Service:  Urology;  Laterality: N/A;  . Vaginal prolapse repair  08/24/2011    Procedure: VAGINAL VAULT SUSPENSION;  Surgeon: Reece Packer, MD;  [facility];  Service: Urology;  Laterality: N/A;  . Abdominal hysterectomy      partial  . Bilateral salpingoophorectomy    . Other surgical history      exc. vocal cord polyp  . Breast biopsy  11/03/2011    Procedure: BREAST BIOPSY WITH NEEDLE LOCALIZATION;  Surgeon: Harl Bowie, MD;  Location: [place];  Service: General;  Laterality: Left;  needle localized left breast biopsy  . Colonoscopy  07/08/2005    562.10  . Esophagogastroduodenoscopy  10/03/2012    multiple   . Tumor removal      benign  . Rectocelle repair     Family History  Problem Relation Age of Onset  . Prostate cancer Father   . Sleep apnea Father   . Stomach cancer Maternal Uncle   . Diabetes Maternal Grandmother   . Stroke Maternal Grandmother   . Heart disease Paternal Grandmother   . Irritable bowel syndrome    . Stroke Maternal Grandfather    Social History  Substance Use Topics  . Smoking status: Former Smoker    Quit date: 08/20/1991  . Smokeless tobacco: Never Used  . Alcohol Use: No     Comment: quit 23 yrs ago  OB History    No data available     Review of Systems  All systems reviewed and negative, other than as noted in HPI.   Allergies  Lyrica; Trazodone and nefazodone; Amitriptyline; Arthrotec; Keflex; Naproxen; Prozac; and Ambien  Home Medications   Prior to Admission medications   Medication Sig Start Date End Date Taking? Authorizing Provider  albuterol (PROAIR HFA) 108 (90 BASE) MCG/ACT inhaler Inhale 2 puffs into the lungs 4 (four) times daily as needed for wheezing or shortness of breath. 09/20/14   Tiffany A Gann, PA-C  ALPRAZolam (XANAX) 0.25 MG tablet Take 0.25 mg by mouth. Reported on 05/01/2015    Historical Provider, MD  budesonide-formoterol (SYMBICORT) 160-4.5 MCG/ACT inhaler Inhale 2 puffs into the  lungs 2 (two) times daily. 03/18/14   Sharion Balloon, FNP  cetirizine (ZYRTEC) 10 MG tablet TAKE ONE TABLET EVERY MORNING 05/05/15   Sharion Balloon, FNP  cyclobenzaprine (FLEXERIL) 10 MG tablet Take 10 mg by mouth every 6 (six) hours as needed for muscle spasms.    Historical Provider, MD  DEXILANT 60 MG capsule TAKE ONE (1) CAPSULE EACH DAY 04/07/15   Sharion Balloon, FNP  diclofenac sodium (VOLTAREN) 1 % GEL Apply topically 4 (four) times daily.    Historical Provider, MD  dicyclomine (BENTYL) 20 MG tablet Take 1 tablet (20 mg total) by mouth every 6 (six) hours as needed for spasms. 05/15/15   Virgel Manifold, MD  docusate sodium (COLACE) 100 MG capsule Take 100-200 mg by mouth daily.     Historical Provider, MD  fluocinonide-emollient (LIDEX-E) 0.05 % cream Apply 1 application topically 2 (two) times daily. 08/29/14   Claretta Fraise, MD  gabapentin (NEURONTIN) 300 MG capsule  02/04/15   Historical Provider, MD  HYDROcodone-acetaminophen (NORCO/VICODIN) 5-325 MG tablet Take 1 tablet by mouth every 6 (six) hours as needed for moderate pain. Reported on 05/19/2015    Historical Provider, MD  LYRICA 75 MG capsule Reported on 05/01/2015 03/13/15   Historical Provider, MD  MAGNESIUM PO Take 1 tablet by mouth daily.     Historical Provider, MD  mometasone (NASONEX) 50 MCG/ACT nasal spray Place 2 sprays into the nose daily. 02/27/15   Sharion Balloon, FNP  montelukast (SINGULAIR) 10 MG tablet TAKE ONE (1) TABLET EACH DAY 04/07/15   Sharion Balloon, FNP  permethrin (ELIMITE) 5 % cream Apply 1 application topically once. 05/08/15   Sharion Balloon, FNP  polyethylene glycol powder (GLYCOLAX/MIRALAX) powder Take 17 g by mouth 2 (two) times daily as needed. 12/16/14   Fransisca Kaufmann Dettinger, MD  Probiotic Product (PROBIOTIC DAILY PO) Take by mouth.    Historical Provider, MD  sertraline (ZOLOFT) 50 MG tablet Take 1 tablet (50 mg total) by mouth daily. 05/05/15   Sharion Balloon, FNP  traMADol (ULTRAM) 50 MG tablet Take 1 tablet  (50 mg total) by mouth every 6 (six) hours as needed. 08/13/14   Christy A Hawks, FNP   BP 140/85 mmHg  Pulse 88  Temp(Src) 98.5 F (36.9 C) (Oral)  Resp 16  Ht 5\' 2"  (1.575 m)  Wt 152 lb (68.947 kg)  BMI 27.79 kg/m2  SpO2 100% Physical Exam  Constitutional: She appears well-developed and well-nourished. No distress.  HENT:  Head: Normocephalic and atraumatic.  Eyes: Conjunctivae are normal. Right eye exhibits no discharge. Left eye exhibits no discharge.  Neck: Neck supple.  Cardiovascular: Normal rate, regular rhythm and normal heart sounds.  Exam reveals no gallop and no friction  rub.   No murmur heard. Pulmonary/Chest: Effort normal and breath sounds normal. No respiratory distress.  Abdominal: Soft. She exhibits no distension. There is tenderness.  Mild tenderness to palpation in the right upper quadrant. No rebound or guarding. No distention. No CVA tenderness.  Musculoskeletal: She exhibits no edema or tenderness.  Neurological: She is alert.  Skin: Skin is warm and dry.  Psychiatric: She has a normal mood and affect. Her behavior is normal. Thought content normal.  Nursing note and vitals reviewed.   ED Course  Procedures (including critical care time) Labs Review Labs Reviewed  COMPREHENSIVE METABOLIC PANEL - Abnormal; Notable for the following:    Calcium 8.7 (*)    AST 75 (*)    ALT 73 (*)    All other components within normal limits  LIPASE, BLOOD - Abnormal; Notable for the following:    Lipase 53 (*)    All other components within normal limits  URINALYSIS, ROUTINE W REFLEX MICROSCOPIC (NOT AT [facility]) - Abnormal; Notable for the following:    Specific Gravity, Urine <1.005 (*)    All other components within normal limits  CBC WITH DIFFERENTIAL/PLATELET    Imaging Review No results found.   Ct Abdomen Pelvis W Contrast  05/15/2015  CLINICAL DATA:  Right upper quadrant pain for 3 months. EXAM: CT ABDOMEN AND PELVIS WITH CONTRAST TECHNIQUE: Multidetector CT  imaging of the abdomen and pelvis was performed using the standard protocol following bolus administration of intravenous contrast. CONTRAST:  31mL OMNIPAQUE IOHEXOL 300 MG/ML SOLN, 143mL OMNIPAQUE IOHEXOL 300 MG/ML SOLN COMPARISON:  08/07/2014 FINDINGS: Minimal dependent atelectasis. Diffuse hepatic steatosis Postcholecystectomy Spleen, pancreas, adrenal glands, are within normal limits. Right kidney is unremarkable. 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 Normal appendix. No obstructing lesion of the colon. Minimal diverticulosis of the descending colon. Bladder is distended.  Uterus is absent.  Adnexa are unremarkable. Actonel adenopathy No vertebral compression deformity Mild atherosclerotic changes of the vasculature. IMPRESSION: No acute pathology.  Bladder distention is noted. Electronically Signed   By: Marybelle Killings M.D.   On: 05/15/2015 21:10   I have personally reviewed and evaluated these images and lab results as part of my medical decision-making.   EKG Interpretation None      MDM   Final diagnoses:  Right upper quadrant pain   60 year old female with right upper quadrant pain. Etiology is not completely clear to me. She is status post cholecystectomy. CT abdomen pelvis is without acute findings. Mild elevation in transaminases. Needs to be followed. At this point I feel she is stable for discharge though. Return precautions were discussed.    Virgel Manifold, MD 05/21/15 [id number]

## 2015-05-22 LAB — HEPATITIS C GENOTYPE

## 2015-05-23 ENCOUNTER — Ambulatory Visit (HOSPITAL_COMMUNITY)
Admission: RE | Admit: 2015-05-23 | Discharge: 2015-05-23 | Disposition: A | Discharge status: Home / Self Care | Payer: Medicare Other | Source: Ambulatory Visit | Attending: Internal Medicine | Admitting: Internal Medicine

## 2015-05-23 DIAGNOSIS — B1710 Acute hepatitis C without hepatic coma: Secondary | ICD-10-CM | POA: Insufficient documentation

## 2015-05-23 DIAGNOSIS — K224 Dyskinesia of esophagus: Secondary | ICD-10-CM | POA: Diagnosis not present

## 2015-05-23 DIAGNOSIS — Z9049 Acquired absence of other specified parts of digestive tract: Secondary | ICD-10-CM | POA: Diagnosis not present

## 2015-05-23 DIAGNOSIS — R1310 Dysphagia, unspecified: Secondary | ICD-10-CM | POA: Diagnosis not present

## 2015-05-23 DIAGNOSIS — R1319 Other dysphagia: Secondary | ICD-10-CM | POA: Insufficient documentation

## 2015-05-23 DIAGNOSIS — K449 Diaphragmatic hernia without obstruction or gangrene: Secondary | ICD-10-CM | POA: Insufficient documentation

## 2015-05-23 DIAGNOSIS — B1920 Unspecified viral hepatitis C without hepatic coma: Secondary | ICD-10-CM | POA: Diagnosis not present

## 2015-05-26 ENCOUNTER — Telehealth (INDEPENDENT_AMBULATORY_CARE_PROVIDER_SITE_OTHER): Payer: Self-pay | Admitting: Internal Medicine

## 2015-05-26 NOTE — Telephone Encounter (Signed)
error 

## 2015-05-26 NOTE — Telephone Encounter (Signed)
Teresa [name], Will treat with Harvoni x 12 weeks.

## 2015-05-28 NOTE — Telephone Encounter (Signed)
A PA has been started. Once we have completed , sent and heard back from [employer], we will contact the patient.

## 2015-05-29 ENCOUNTER — Ambulatory Visit (INDEPENDENT_AMBULATORY_CARE_PROVIDER_SITE_OTHER): Payer: Medicare Other | Admitting: Family

## 2015-05-29 ENCOUNTER — Encounter: Payer: Self-pay | Admitting: Family

## 2015-05-29 VITALS — BP 121/78 | HR 91 | Temp 97.2°F | Ht 61.0 in | Wt 151.2 lb

## 2015-05-29 DIAGNOSIS — F411 Generalized anxiety disorder: Secondary | ICD-10-CM

## 2015-05-29 DIAGNOSIS — F329 Major depressive disorder, single episode, unspecified: Secondary | ICD-10-CM

## 2015-05-29 DIAGNOSIS — F32A Depression, unspecified: Secondary | ICD-10-CM

## 2015-05-29 MED ORDER — SERTRALINE HCL 100 MG PO TABS: 100.0000 mg | ORAL_TABLET | Freq: Every day | ORAL | Status: DC

## 2015-05-29 NOTE — Progress Notes (Signed)
Subjective:    Patient ID: Teresa [name], female    DOB: [birth year], 58 y.o.   MRN: [id number]  Pt presents to the office today to recheck GAD and Depression. Pt currently taking Zoloft 50 mg daily. Pt states this seems to be helping, but feels like it needs to be increased.  Anxiety Presents for follow-up visit. Onset was 1 to 5 years ago. The problem has been waxing and waning. Symptoms include depressed mood, excessive worry, insomnia, nervous/anxious behavior, panic and restlessness. Patient reports no irritability, palpitations, shortness of breath or suicidal ideas. Symptoms occur most days. The symptoms are aggravated by family issues. Nighttime awakenings: none.   Her past medical history is significant for anxiety/panic attacks and depression. Past treatments include SSRIs. Compliance with prior treatments has been good.  Depression      The patient presents with depression.  This is a chronic problem.  The current episode started more than 1 year ago.   The onset quality is gradual.   The problem occurs every several days.  The problem has been waxing and waning since onset.  Associated symptoms include insomnia, restlessness, decreased interest and sad.  Associated symptoms include no helplessness, no hopelessness, no appetite change, no headaches and no suicidal ideas.  Past treatments include SSRIs - Selective serotonin reuptake inhibitors.  Compliance with treatment is good.  Past medical history includes anxiety and depression.       Review of Systems  Constitutional: Negative.  Negative for appetite change and irritability.  HENT: Negative.   Eyes: Negative.   Respiratory: Negative.  Negative for shortness of breath.   Cardiovascular: Negative.  Negative for palpitations.  Gastrointestinal: Negative.   Endocrine: Negative.   Genitourinary: Negative.   Musculoskeletal: Negative.   Neurological: Negative.  Negative for headaches.  Hematological: Negative.     Psychiatric/Behavioral: Positive for depression. Negative for suicidal ideas. The patient is nervous/anxious and has insomnia.   All other systems reviewed and are negative.      Objective:   Physical Exam  Constitutional: She is oriented to person, place, and time. She appears well-developed and well-nourished. No distress.  HENT:  Head: Normocephalic and atraumatic.  Eyes: Pupils are equal, round, and reactive to light.  Neck: Normal range of motion. Neck supple. No thyromegaly present.  Cardiovascular: Normal rate, regular rhythm, normal heart sounds and intact distal pulses.   No murmur heard. Pulmonary/Chest: Effort normal and breath sounds normal. No respiratory distress. She has no wheezes.  Abdominal: Soft. Bowel sounds are normal. She exhibits no distension. There is no tenderness.  Musculoskeletal: Normal range of motion. She exhibits no edema or tenderness.  Neurological: She is alert and oriented to person, place, and time.  Skin: Skin is warm and dry.  Psychiatric: She has a normal mood and affect. Her behavior is normal. Judgment and thought content normal.  Vitals reviewed.   BP 121/78 mmHg  Pulse 91  Temp(Src) 97.2 F (36.2 C) (Oral)  Ht 5\' 1"  (1.549 m)  Wt 151 lb 3.2 oz (68.584 kg)  BMI 28.58 kg/m2       Assessment & Plan:  1. Depression - sertraline (ZOLOFT) 100 MG tablet; Take 1 tablet (100 mg total) by mouth daily.  Dispense: 30 tablet; Refill: 3  2. GAD (generalized anxiety disorder) - sertraline (ZOLOFT) 100 MG tablet; Take 1 tablet (100 mg total) by mouth daily.  Dispense: 30 tablet; Refill: 3  Zoloft increased to 100 mg from 50 mg Stress management discussed RTO 6  months  Evelina Dun, FNP

## 2015-05-29 NOTE — Patient Instructions (Signed)
° °Generalized Anxiety Disorder °Generalized anxiety disorder (GAD) is a mental disorder. It interferes with life functions, including relationships, work, and school. °GAD is different from normal anxiety, which everyone experiences at some point in their lives in response to specific life events and activities. Normal anxiety actually helps us prepare for and get through these life events and activities. Normal anxiety goes away after the event or activity is over.  °GAD causes anxiety that is not necessarily related to specific events or activities. It also causes excess anxiety in proportion to specific events or activities. The anxiety associated with GAD is also difficult to control. GAD can vary from mild to severe. People with severe GAD can have intense waves of anxiety with physical symptoms (panic attacks).  °SYMPTOMS °The anxiety and worry associated with GAD are difficult to control. This anxiety and worry are related to many life events and activities and also occur more days than not for 6 months or longer. People with GAD also have three or more of the following symptoms (one or more in children): °· Restlessness.   °· Fatigue. °· Difficulty concentrating.   °· Irritability. °· Muscle tension. °· Difficulty sleeping or unsatisfying sleep. °DIAGNOSIS °GAD is diagnosed through an assessment by your health care provider. Your health care provider will ask you questions about your mood, physical symptoms, and events in your life. Your health care provider may ask you about your medical history and use of alcohol or drugs, including prescription medicines. Your health care provider may also do a physical exam and blood tests. Certain medical conditions and the use of certain substances can cause symptoms similar to those associated with GAD. Your health care provider may refer you to a mental health specialist for further evaluation. °TREATMENT °The following therapies are usually used to treat GAD:   °· Medication. Antidepressant medication usually is prescribed for long-term daily control. Antianxiety medicines may be added in severe cases, especially when panic attacks occur.   °· Talk therapy (psychotherapy). Certain types of talk therapy can be helpful in treating GAD by providing support, education, and guidance. A form of talk therapy called cognitive behavioral therapy can teach you healthy ways to think about and react to daily life events and activities. °· Stress management techniques. These include yoga, meditation, and exercise and can be very helpful when they are practiced regularly. °A mental health specialist can help determine which treatment is best for you. Some people see improvement with one therapy. However, other people require a combination of therapies. °  °This information is not intended to replace advice given to you by your health care provider. Make sure you discuss any questions you have with your health care provider. °  °Document Released: 06/12/2012 Document Revised: 03/08/2014 Document Reviewed: 06/12/2012 °Elsevier Interactive Patient Education ©2016 [employer] Inc[employer] ° °Major Depressive Disorder °Major depressive disorder is a mental illness. It also may be called clinical depression or unipolar depression. Major depressive disorder usually causes feelings of sadness, hopelessness, or helplessness. Some people with this disorder do not feel particularly sad but lose interest in doing things they used to enjoy (anhedonia). Major depressive disorder also can cause physical symptoms. It can interfere with work, school, relationships, and other normal everyday activities. The disorder varies in severity but is longer lasting and more serious than the sadness we all feel from time to time in our lives. °Major depressive disorder often is triggered by stressful life events or major life changes. Examples of these triggers include divorce, loss of your job or home,   a move, and the  death of a family member or close friend. Sometimes this disorder occurs for no obvious reason at all. People who have family members with major depressive disorder or bipolar disorder are at higher risk for developing this disorder, with or without life stressors. Major depressive disorder can occur at any age. It may occur just once in your life (single episode major depressive disorder). It may occur multiple times (recurrent major depressive disorder). °SYMPTOMS °People with major depressive disorder have either anhedonia or depressed mood on nearly a daily basis for at least 2 weeks or longer. Symptoms of depressed mood include: °· Feelings of sadness (blue or down in the dumps) or emptiness. °· Feelings of hopelessness or helplessness. °· Tearfulness or episodes of crying (may be observed by others). °· Irritability (children and adolescents). °In addition to depressed mood or anhedonia or both, people with this disorder have at least four of the following symptoms: °· Difficulty sleeping or sleeping too much.   °· Significant change (increase or decrease) in appetite or weight.   °· Lack of energy or motivation. °· Feelings of guilt and worthlessness.   °· Difficulty concentrating, remembering, or making decisions. °· Unusually slow movement (psychomotor retardation) or restlessness (as observed by others).   °· Recurrent wishes for death, recurrent thoughts of self-harm (suicide), or a suicide attempt. °People with major depressive disorder commonly have persistent negative thoughts about themselves, other people, and the world. People with severe major depressive disorder may experience distorted beliefs or perceptions about the world (psychotic delusions). They also may see or hear things that are not real (psychotic hallucinations). °DIAGNOSIS °Major depressive disorder is diagnosed through an assessment by your health care provider. Your health care provider will ask about aspects of your daily life,  such as mood, sleep, and appetite, to see if you have the diagnostic symptoms of major depressive disorder. Your health care provider may ask about your medical history and use of alcohol or drugs, including prescription medicines. Your health care provider also may do a physical exam and blood work. This is because certain medical conditions and the use of certain substances can cause major depressive disorder-like symptoms (secondary depression). Your health care provider also may refer you to a mental health specialist for further evaluation and treatment. °TREATMENT °It is important to recognize the symptoms of major depressive disorder and seek treatment. The following treatments can be prescribed for this disorder:   °· Medicine. Antidepressant medicines usually are prescribed. Antidepressant medicines are thought to correct chemical imbalances in the brain that are commonly associated with major depressive disorder. Other types of medicine may be added if the symptoms do not respond to antidepressant medicines alone or if psychotic delusions or hallucinations occur. °· Talk therapy. Talk therapy can be helpful in treating major depressive disorder by providing support, education, and guidance. Certain types of talk therapy also can help with negative thinking (cognitive behavioral therapy) and with relationship issues that trigger this disorder (interpersonal therapy). °A mental health specialist can help determine which treatment is best for you. Most people with major depressive disorder do well with a combination of medicine and talk therapy. Treatments involving electrical stimulation of the brain can be used in situations with extremely severe symptoms or when medicine and talk therapy do not work over time. These treatments include electroconvulsive therapy, transcranial magnetic stimulation, and vagal nerve stimulation. °  °This information is not intended to replace advice given to you by your health  care provider. Make sure you discuss any questions you have   with your health care provider. °  °Document Released: 06/12/2012 Document Revised: 03/08/2014 Document Reviewed: 06/12/2012 °Elsevier Interactive Patient Education ©2016 [employer] Inc. ° °

## 2015-06-02 ENCOUNTER — Telehealth (INDEPENDENT_AMBULATORY_CARE_PROVIDER_SITE_OTHER): Payer: Self-pay | Admitting: *Deleted

## 2015-06-02 NOTE — Telephone Encounter (Signed)
Patient was called and made aware that she had been approved for the [facility]. She was advised that she may get a call from a 800 number and to please answer as this may be the mail order pharmacy calling to make the arrangements for delivery of her medication. She was also ask to call our office once she got it , so she could let us know that she has or was planning to start the medication. This will help [place] to make lab appointments and OV appointments. Teresa [name] stated that she would.

## 2015-06-03 ENCOUNTER — Other Ambulatory Visit: Payer: Self-pay | Admitting: Family

## 2015-06-04 ENCOUNTER — Other Ambulatory Visit: Payer: Self-pay | Admitting: Family

## 2015-06-06 DIAGNOSIS — R5383 Other fatigue: Secondary | ICD-10-CM | POA: Diagnosis not present

## 2015-06-06 DIAGNOSIS — M5416 Radiculopathy, lumbar region: Secondary | ICD-10-CM | POA: Diagnosis not present

## 2015-06-06 DIAGNOSIS — M545 Low back pain: Secondary | ICD-10-CM | POA: Diagnosis not present

## 2015-06-06 DIAGNOSIS — F5109 Other insomnia not due to a substance or known physiological condition: Secondary | ICD-10-CM | POA: Diagnosis not present

## 2015-06-07 ENCOUNTER — Telehealth: Payer: Self-pay | Admitting: Family

## 2015-06-07 MED ORDER — NYSTATIN 100000 UNIT/ML MT SUSP: 5.0000 mL | Freq: Four times a day (QID) | OROMUCOSAL | Status: DC

## 2015-06-07 NOTE — Telephone Encounter (Signed)
Mycostatin rx sent into [facility] per Dr.Bradshaw.

## 2015-06-09 ENCOUNTER — Telehealth (INDEPENDENT_AMBULATORY_CARE_PROVIDER_SITE_OTHER): Payer: Self-pay | Admitting: *Deleted

## 2015-06-09 ENCOUNTER — Other Ambulatory Visit: Payer: Self-pay | Admitting: *Deleted

## 2015-06-09 DIAGNOSIS — B182 Chronic viral hepatitis C: Secondary | ICD-10-CM

## 2015-06-09 DIAGNOSIS — J4540 Moderate persistent asthma, uncomplicated: Secondary | ICD-10-CM

## 2015-06-09 MED ORDER — ALBUTEROL SULFATE HFA 108 (90 BASE) MCG/ACT IN AERS: 2.0000 | INHALATION_SPRAY | Freq: Four times a day (QID) | RESPIRATORY_TRACT | Status: DC | PRN

## 2015-06-09 NOTE — Telephone Encounter (Signed)
Teresa [name]- Patient states that she thinks that she started her Harvoni on 06/05/2015. Per Teresa [name] labs in 4 weeks followed by a OV.   Hope - patient will need a OV with Teresa [name] the Wednesday , Thursday after the 8th of May.

## 2015-06-10 ENCOUNTER — Encounter (INDEPENDENT_AMBULATORY_CARE_PROVIDER_SITE_OTHER): Payer: Self-pay | Admitting: *Deleted

## 2015-06-10 NOTE — Telephone Encounter (Signed)
Tammy called patient

## 2015-06-23 ENCOUNTER — Encounter: Payer: Medicare Other | Admitting: *Deleted

## 2015-06-23 DIAGNOSIS — Z1231 Encounter for screening mammogram for malignant neoplasm of breast: Secondary | ICD-10-CM | POA: Diagnosis not present

## 2015-06-23 LAB — HM MAMMOGRAPHY

## 2015-06-25 ENCOUNTER — Other Ambulatory Visit (INDEPENDENT_AMBULATORY_CARE_PROVIDER_SITE_OTHER): Payer: Self-pay | Admitting: *Deleted

## 2015-06-25 ENCOUNTER — Encounter (INDEPENDENT_AMBULATORY_CARE_PROVIDER_SITE_OTHER): Payer: Self-pay | Admitting: *Deleted

## 2015-06-25 DIAGNOSIS — B182 Chronic viral hepatitis C: Secondary | ICD-10-CM

## 2015-07-02 ENCOUNTER — Encounter: Payer: Self-pay | Admitting: *Deleted

## 2015-07-03 ENCOUNTER — Ambulatory Visit (INDEPENDENT_AMBULATORY_CARE_PROVIDER_SITE_OTHER): Payer: Medicaid Other | Admitting: Nurse Practitioner

## 2015-07-03 ENCOUNTER — Encounter: Payer: Self-pay | Admitting: Nurse Practitioner

## 2015-07-03 VITALS — BP 122/79 | HR 97 | Temp 97.2°F | Ht 61.0 in | Wt 155.6 lb

## 2015-07-03 DIAGNOSIS — K5732 Diverticulitis of large intestine without perforation or abscess without bleeding: Secondary | ICD-10-CM

## 2015-07-03 MED ORDER — METRONIDAZOLE 500 MG PO TABS: 500.0000 mg | ORAL_TABLET | Freq: Two times a day (BID) | ORAL | Status: DC

## 2015-07-03 MED ORDER — CIPROFLOXACIN HCL 500 MG PO TABS: 500.0000 mg | ORAL_TABLET | Freq: Two times a day (BID) | ORAL | Status: DC

## 2015-07-03 NOTE — Progress Notes (Signed)
   Subjective:    Patient ID: Teresa [name], female    DOB: Jan 26, 1956, 60 y.o.   MRN: [id number]  HPI Patient in c/o abdominal pain- started 1 week ao- has history of diverticulitis and pain is the same as flare ups in the past. No fever- mild constipation but ni diarrhea.    Review of Systems  Constitutional: Negative.   HENT: Negative.   Respiratory: Negative.   Cardiovascular: Negative.   Gastrointestinal: Positive for abdominal pain and constipation.  Genitourinary: Negative.   Neurological: Negative.   Psychiatric/Behavioral: Negative.   All other systems reviewed and are negative.      Objective:   Physical Exam  Constitutional: She is oriented to person, place, and time. She appears well-developed and well-nourished. No distress.  Cardiovascular: Normal rate, regular rhythm and normal heart sounds.   Pulmonary/Chest: Effort normal and breath sounds normal.  Abdominal: Soft. She exhibits no distension and no mass. There is tenderness (LLQ on palpation). There is no rebound and no guarding.  Neurological: She is alert and oriented to person, place, and time.  Skin: Skin is warm.  Psychiatric: She has a normal mood and affect. Her behavior is normal. Judgment and thought content normal.   BP 122/79 mmHg  Pulse 97  Temp(Src) 97.2 F (36.2 C) (Oral)  Ht 5\' 1"  (1.549 m)  Wt 155 lb 9.6 oz (70.58 kg)  BMI 29.42 kg/m2        Assessment & Plan:   1. Diverticulitis of colon    Meds ordered this encounter  Medications  . metroNIDAZOLE (FLAGYL) 500 MG tablet    Sig: Take 1 tablet (500 mg total) by mouth 2 (two) times daily.    Dispense:  14 tablet    Refill:  0    Order Specific Question:  Supervising Provider    Answer:  Chipper Herb [[id number]]  . ciprofloxacin (CIPRO) 500 MG tablet    Sig: Take 1 tablet (500 mg total) by mouth 2 (two) times daily.    Dispense:  20 tablet    Refill:  0    Order Specific Question:  Supervising Provider    Answer:  Joycelyn Man   Watch foods with small seeds or undigestible skin RTO prn  Mary-Margaret Hassell Done, FNP

## 2015-07-03 NOTE — Patient Instructions (Signed)
Diverticulitis °Diverticulitis is inflammation or infection of small pouches in your colon that form when you have a condition called diverticulosis. The pouches in your colon are called diverticula. Your colon, or large intestine, is where water is absorbed and stool is formed. °Complications of diverticulitis can include: °· Bleeding. °· Severe infection. °· Severe pain. °· Perforation of your colon. °· Obstruction of your colon. °CAUSES  °Diverticulitis is caused by bacteria. °Diverticulitis happens when stool becomes trapped in diverticula. This allows bacteria to grow in the diverticula, which can lead to inflammation and infection. °RISK FACTORS °People with diverticulosis are at risk for diverticulitis. Eating a diet that does not include enough fiber from fruits and vegetables may make diverticulitis more likely to develop. °SYMPTOMS  °Symptoms of diverticulitis may include: °· Abdominal pain and tenderness. The pain is normally located on the left side of the abdomen, but may occur in other areas. °· Fever and chills. °· Bloating. °· Cramping. °· Nausea. °· Vomiting. °· Constipation. °· Diarrhea. °· Blood in your stool. °DIAGNOSIS  °Your health care provider will ask you about your medical history and do a physical exam. You may need to have tests done because many medical conditions can cause the same symptoms as diverticulitis. Tests may include: °· Blood tests. °· Urine tests. °· Imaging tests of the abdomen, including X-rays and CT scans. °When your condition is under control, your health care provider may recommend that you have a colonoscopy. A colonoscopy can show how severe your diverticula are and whether something else is causing your symptoms. °TREATMENT  °Most cases of diverticulitis are mild and can be treated at home. Treatment may include: °· Taking over-the-counter pain medicines. °· Following a clear liquid diet. °· Taking antibiotic medicines by mouth for 7-10 days. °More severe cases may  be treated at a hospital. Treatment may include: °· Not eating or drinking. °· Taking prescription pain medicine. °· Receiving antibiotic medicines through an IV tube. °· Receiving fluids and nutrition through an IV tube. °· Surgery. °HOME CARE INSTRUCTIONS  °· Follow your health care provider's instructions carefully. °· Follow a full liquid diet or other diet as directed by your health care provider. After your symptoms improve, your health care provider may tell you to change your diet. He or she may recommend you eat a high-fiber diet. Fruits and vegetables are good sources of fiber. Fiber makes it easier to pass stool. °· Take fiber supplements or probiotics as directed by your health care provider. °· Only take medicines as directed by your health care provider. °· Keep all your follow-up appointments. °SEEK MEDICAL CARE IF:  °· Your pain does not improve. °· You have a hard time eating food. °· Your bowel movements do not return to normal. °SEEK IMMEDIATE MEDICAL CARE IF:  °· Your pain becomes worse. °· Your symptoms do not get better. °· Your symptoms suddenly get worse. °· You have a fever. °· You have repeated vomiting. °· You have bloody or black, tarry stools. °MAKE SURE YOU:  °· Understand these instructions. °· Will watch your condition. °· Will get help right away if you are not doing well or get worse. °  °This information is not intended to replace advice given to you by your health care provider. Make sure you discuss any questions you have with your health care provider. °  °Document Released: 11/25/2004 Document Revised: 02/20/2013 Document Reviewed: 01/10/2013 °Elsevier Interactive Patient Education ©2016 Elsevier Inc. ° °

## 2015-07-04 ENCOUNTER — Other Ambulatory Visit: Payer: Self-pay | Admitting: Family

## 2015-07-07 ENCOUNTER — Encounter (INDEPENDENT_AMBULATORY_CARE_PROVIDER_SITE_OTHER): Payer: Medicare Other | Admitting: Internal Medicine

## 2015-07-07 DIAGNOSIS — B182 Chronic viral hepatitis C: Secondary | ICD-10-CM | POA: Diagnosis not present

## 2015-07-08 LAB — CBC WITH DIFFERENTIAL/PLATELET
BASOS ABS: 47 {cells}/uL (ref 0–200)
BASOS PCT: 1 %
EOS ABS: 94 {cells}/uL (ref 15–500)
Eosinophils Relative: 2 %
HEMATOCRIT: 37.2 % (ref 35.0–45.0)
HEMOGLOBIN: 12.3 g/dL (ref 11.7–15.5)
LYMPHS ABS: 1457 {cells}/uL (ref 850–3900)
Lymphocytes Relative: 31 %
MCH: 27.3 pg (ref 27.0–33.0)
MCHC: 33.1 g/dL (ref 32.0–36.0)
MCV: 82.7 fL (ref 80.0–100.0)
MONO ABS: 282 {cells}/uL (ref 200–950)
MPV: 9.7 fL (ref 7.5–12.5)
Monocytes Relative: 6 %
NEUTROS ABS: 2820 {cells}/uL (ref 1500–7800)
Neutrophils Relative %: 60 %
Platelets: 132 10*3/uL — ABNORMAL LOW (ref 140–400)
RBC: 4.5 MIL/uL (ref 3.80–5.10)
RDW: 14.9 % (ref 11.0–15.0)
WBC: 4.7 10*3/uL (ref 3.8–10.8)

## 2015-07-08 LAB — HEPATIC FUNCTION PANEL
ALBUMIN: 3.8 g/dL (ref 3.6–5.1)
ALK PHOS: 72 U/L (ref 33–130)
ALT: 20 U/L (ref 6–29)
AST: 24 U/L (ref 10–35)
BILIRUBIN INDIRECT: 0.5 mg/dL (ref 0.2–1.2)
Bilirubin, Direct: 0.2 mg/dL (ref <=0.2)
TOTAL PROTEIN: 6.4 g/dL (ref 6.1–8.1)
Total Bilirubin: 0.7 mg/dL (ref 0.2–1.2)

## 2015-07-08 LAB — HEPATITIS C RNA QUANTITATIVE: HCV Quantitative: NOT DETECTED IU/mL (ref <15)

## 2015-07-10 ENCOUNTER — Encounter (INDEPENDENT_AMBULATORY_CARE_PROVIDER_SITE_OTHER): Payer: Self-pay | Admitting: Internal Medicine

## 2015-07-10 ENCOUNTER — Ambulatory Visit (INDEPENDENT_AMBULATORY_CARE_PROVIDER_SITE_OTHER): Payer: Medicare Other | Admitting: Internal Medicine

## 2015-07-10 VITALS — BP 110/70 | HR 73 | Temp 98.1°F | Resp 18 | Ht 63.0 in | Wt 153.6 lb

## 2015-07-10 DIAGNOSIS — B1920 Unspecified viral hepatitis C without hepatic coma: Secondary | ICD-10-CM

## 2015-07-10 NOTE — Progress Notes (Addendum)
Subjective:    Patient ID: Teresa [name], female    DOB: 1955/09/06, 60 y.o.   MRN: [id number]  HPI Here today for f/u of her Hepatitis C. She is genotype 1A. Started tx 06/05/2014 with Harvoni. She says she feels tired. Appetite is good. No weight loss.  She occasionally has rt upper quadrant pain. No dysphagia. She has a BM daily. No melena or BRRB.  No etoh in greater than 23 yrs. No drugs in greater than 23 year.   CBC    Component Value Date/Time   WBC 4.7 07/07/2015 1335   WBC 5.1 05/01/2015 1311   WBC 7.4 06/08/2014 1137   RBC 4.50 07/07/2015 1335   RBC 4.73 05/01/2015 1311   RBC 5.48 06/08/2014 1137   HGB 12.3 07/07/2015 1335   HGB 13.7 06/08/2014 1137   HCT 37.2 07/07/2015 1335   HCT 39.1 05/01/2015 1311   HCT 44.7 06/08/2014 1137   PLT 132* 07/07/2015 1335   PLT 165 05/01/2015 1311   MCV 82.7 07/07/2015 1335   MCV 83 05/01/2015 1311   MCV 81.5 06/08/2014 1137   MCH 27.3 07/07/2015 1335   MCH 27.9 05/01/2015 1311   MCH 25.0* 06/08/2014 1137   MCHC 33.1 07/07/2015 1335   MCHC 33.8 05/01/2015 1311   MCHC 30.6* 06/08/2014 1137   RDW 14.9 07/07/2015 1335   RDW 14.6 05/01/2015 1311   LYMPHSABS 1457 07/07/2015 1335   LYMPHSABS 1.6 05/01/2015 1311   MONOABS 282 07/07/2015 1335   EOSABS 94 07/07/2015 1335   EOSABS 0.1 05/01/2015 1311   BASOSABS 47 07/07/2015 1335   BASOSABS 0.0 05/01/2015 1311   Hepatic Function Panel     Component Value Date/Time   PROT 6.4 07/07/2015 1332   PROT 6.9 05/01/2015 1311   ALBUMIN 3.8 07/07/2015 1332   ALBUMIN 3.9 05/01/2015 1311   AST 24 07/07/2015 1332   ALT 20 07/07/2015 1332   ALKPHOS 72 07/07/2015 1332   BILITOT 0.7 07/07/2015 1332   BILITOT 1.1 05/01/2015 1311   BILIDIR 0.2 07/07/2015 1332   IBILI 0.5 07/07/2015 1332    Hep C quaint. Undetected.     Review of Systems Past Medical History  Diagnosis Date  . Diverticulosis 07-08-2005    colonoscopy  . Hiatal hernia 07-08-2005    EGD  . GERD (gastroesophageal  reflux disease) 07-08-2005    EGD  . Irritable bowel syndrome   . Vocal cord polyp     history of  . Degeneration of intervertebral disc, site unspecified   . Osteoarthrosis, unspecified whether generalized or localized, lower leg   . Depressive disorder, not elsewhere classified   . Anxiety state, unspecified   . Full dentures   . Seizures ([place]) 1990s    x 1, unknown cause; no seizures since  . Chronic back pain greater than 3 months duration   . Seasonal allergies   . Papilloma of breast 09/2011    left  . Asthma     prn inhaler  . Esophageal stricture 2014  . Insomnia   . Muscle spasm   . Migraine   . Diverticulitis   . Colon polyp     Past Surgical History  Procedure Laterality Date  . Cholecystectomy    . Bladder surgery      bladder tack  . 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 Cystoscopy  08/24/2011    Procedure: CYSTOSCOPY;  Surgeon: Reece Packer, MD;  Location: [facility];  Service: Urology;  Laterality: N/A;  Cystoscopy,  Rectocele Repair and Vault Prolapse Repair  . Rectocele repair  08/24/2011    Procedure: POSTERIOR REPAIR (RECTOCELE);  Surgeon: Reece Packer, MD;  Location: [facility];  Service: Urology;  Laterality: N/A;  . Vaginal prolapse repair  08/24/2011    Procedure: VAGINAL VAULT SUSPENSION;  Surgeon: Reece Packer, MD;  Location: [facility];  Service: Urology;  Laterality: N/A;  . Abdominal hysterectomy      partial  . Bilateral salpingoophorectomy    . Other surgical history      exc. vocal cord polyp  . Breast biopsy  11/03/2011    Procedure: BREAST BIOPSY WITH NEEDLE LOCALIZATION;  Surgeon: Harl Bowie, MD;  Location: [facility];  Service: General;  Laterality: Left;  needle localized left breast biopsy  . Colonoscopy  07/08/2005    562.10  . Esophagogastroduodenoscopy  10/03/2012    multiple   . Tumor removal      benign  . Rectocelle repair      Allergies  Allergen Reactions  . Lyrica [Pregabalin] Shortness Of  Breath  . Trazodone And Nefazodone Shortness Of Breath  . Amitriptyline   . Arthrotec [Diclofenac-Misoprostol] Other (See Comments)    Upset stomach  . Keflex [Cephalexin] Other (See Comments)    Unknown reaction  . Naproxen Other (See Comments)    Irritates stomach  . Prozac [Fluoxetine Hcl] Other (See Comments)    "makes me feel bad"  . Ambien [Zolpidem Tartrate] Other (See Comments)    COULDN'T FUNCTION, STAYED SLEEPY    Current Outpatient Prescriptions on File Prior to Visit  Medication Sig Dispense Refill  . albuterol (PROAIR HFA) 108 (90 Base) MCG/ACT inhaler Inhale 2 puffs into the lungs 4 (four) times daily as needed for wheezing or shortness of breath. 3 Inhaler 1  . cetirizine (ZYRTEC) 10 MG tablet TAKE ONE TABLET EVERY MORNING 30 tablet 11  . ciprofloxacin (CIPRO) 500 MG tablet Take 1 tablet (500 mg total) by mouth 2 (two) times daily. 20 tablet 0  . cyclobenzaprine (FLEXERIL) 10 MG tablet Take 10 mg by mouth every 6 (six) hours as needed for muscle spasms.    . DEXILANT 60 MG capsule TAKE ONE (1) CAPSULE EACH DAY 90 capsule 1  . docusate sodium (COLACE) 100 MG capsule Take 100-200 mg by mouth daily.     . metroNIDAZOLE (FLAGYL) 500 MG tablet Take 1 tablet (500 mg total) by mouth 2 (two) times daily. 14 tablet 0  . mometasone (NASONEX) 50 MCG/ACT nasal spray Place 2 sprays into the nose daily. 17 g 12  . montelukast (SINGULAIR) 10 MG tablet TAKE ONE (1) TABLET EACH DAY 90 tablet 1  . NUCYNTA 50 MG TABS tablet Take 50 mg by mouth 3 (three) times daily as needed.     . polyethylene glycol powder (GLYCOLAX/MIRALAX) powder Take 17 g by mouth 2 (two) times daily as needed. 3350 g 1  . Probiotic Product (PROBIOTIC DAILY PO) Take by mouth.    . promethazine (PHENERGAN) 25 MG tablet Take 25 mg by mouth as needed.     . sertraline (ZOLOFT) 100 MG tablet Take 1 tablet (100 mg total) by mouth daily. 30 tablet 3  . SYMBICORT 160-4.5 MCG/ACT inhaler USE 2 PUFFS TWICE DAILY 10.2 g 2  .  dicyclomine (BENTYL) 20 MG tablet Take 1 tablet (20 mg total) by mouth every 6 (six) hours as needed for spasms. (Patient not taking: Reported on 07/10/2015) 12 tablet 0  .  fluocinonide-emollient (LIDEX-E) 0.05 % cream Apply 1 application topically 2 (two) times daily. (Patient not taking: Reported on 07/10/2015) 60 g 5  . hydrOXYzine (ATARAX/VISTARIL) 25 MG tablet Reported on 07/10/2015    . MAGNESIUM PO Take 1 tablet by mouth daily. Reported on 07/10/2015    . nystatin (MYCOSTATIN) 100000 UNIT/ML suspension Take 5 mLs (500,000 Units total) by mouth 4 (four) times daily. (Patient not taking: Reported on 07/10/2015) 60 mL 0   No current facility-administered medications on file prior to visit.        Objective:   Physical Exam Blood pressure 110/70, pulse 73, temperature 98.1 F (36.7 C), temperature source Oral, resp. rate 18, height 5\' 3"  (1.6 m), weight 153 lb 9.6 oz (69.673 kg). Alert and oriented. Skin warm and dry. Oral mucosa is moist.   . Sclera anicteric, conjunctivae is pink. Thyroid not enlarged. No cervical lymphadenopathy. Lungs clear. Heart regular rate and rhythm.  Abdomen is soft. Bowel sounds are positive. No hepatomegaly. No abdominal masses felt. No tenderness.  No edema to lower extremities. Patient is alert and oriented.       Assessment & Plan:  Hepatitis C. She has cleared the virus. She is doing well. She will have an OV in 8 weeks.

## 2015-07-10 NOTE — Patient Instructions (Signed)
OV in 8 weeks.  

## 2015-07-14 ENCOUNTER — Telehealth (INDEPENDENT_AMBULATORY_CARE_PROVIDER_SITE_OTHER): Payer: Self-pay | Admitting: Internal Medicine

## 2015-07-14 NOTE — Telephone Encounter (Signed)
I advised patient no tylenol

## 2015-07-14 NOTE — Telephone Encounter (Signed)
Patient called, left message - she'd like to know what kind of medication she can tell Dr. Merlene Laughter she can take for pain other than what she's currently taking.  She'd like a call back.  [phone]

## 2015-07-15 ENCOUNTER — Telehealth (INDEPENDENT_AMBULATORY_CARE_PROVIDER_SITE_OTHER): Payer: Self-pay | Admitting: Internal Medicine

## 2015-07-15 NOTE — Telephone Encounter (Signed)
Patient called, stated that she's on her second refill of Harvoni.  She stated that she has one more refill after she finishes this one, her question is, does she need to stop once she finishes the one she's on or does she need to have the last refill filled and take it?  [phone]

## 2015-07-15 NOTE — Telephone Encounter (Signed)
She needs to take all the medication

## 2015-07-16 NOTE — Telephone Encounter (Signed)
Patient called and advised her to take all her medication

## 2015-07-17 DIAGNOSIS — R5383 Other fatigue: Secondary | ICD-10-CM | POA: Diagnosis not present

## 2015-07-17 DIAGNOSIS — F5109 Other insomnia not due to a substance or known physiological condition: Secondary | ICD-10-CM | POA: Diagnosis not present

## 2015-07-17 DIAGNOSIS — M545 Low back pain: Secondary | ICD-10-CM | POA: Diagnosis not present

## 2015-07-17 DIAGNOSIS — M5416 Radiculopathy, lumbar region: Secondary | ICD-10-CM | POA: Diagnosis not present

## 2015-08-12 DIAGNOSIS — M545 Low back pain: Secondary | ICD-10-CM | POA: Diagnosis not present

## 2015-08-12 DIAGNOSIS — F5109 Other insomnia not due to a substance or known physiological condition: Secondary | ICD-10-CM | POA: Diagnosis not present

## 2015-08-12 DIAGNOSIS — J45909 Unspecified asthma, uncomplicated: Secondary | ICD-10-CM | POA: Diagnosis not present

## 2015-08-12 DIAGNOSIS — M5416 Radiculopathy, lumbar region: Secondary | ICD-10-CM | POA: Diagnosis not present

## 2015-09-03 ENCOUNTER — Other Ambulatory Visit: Payer: Self-pay | Admitting: Family

## 2015-09-04 ENCOUNTER — Ambulatory Visit (INDEPENDENT_AMBULATORY_CARE_PROVIDER_SITE_OTHER): Payer: Medicare Other | Admitting: Internal Medicine

## 2015-09-04 ENCOUNTER — Encounter (INDEPENDENT_AMBULATORY_CARE_PROVIDER_SITE_OTHER): Payer: Self-pay | Admitting: Internal Medicine

## 2015-09-04 VITALS — BP 122/70 | HR 84 | Temp 98.1°F | Ht 62.0 in | Wt 152.1 lb

## 2015-09-04 DIAGNOSIS — B1920 Unspecified viral hepatitis C without hepatic coma: Secondary | ICD-10-CM | POA: Diagnosis not present

## 2015-09-04 LAB — CBC WITH DIFFERENTIAL/PLATELET
BASOS PCT: 0 %
Basophils Absolute: 0 cells/uL (ref 0–200)
EOS ABS: 171 {cells}/uL (ref 15–500)
Eosinophils Relative: 3 %
HEMATOCRIT: 38.2 % (ref 35.0–45.0)
Hemoglobin: 12.7 g/dL (ref 11.7–15.5)
LYMPHS PCT: 17 %
Lymphs Abs: 969 cells/uL (ref 850–3900)
MCH: 27 pg (ref 27.0–33.0)
MCHC: 33.2 g/dL (ref 32.0–36.0)
MCV: 81.1 fL (ref 80.0–100.0)
MONOS PCT: 9 %
MPV: 10.1 fL (ref 7.5–12.5)
Monocytes Absolute: 513 cells/uL (ref 200–950)
NEUTROS PCT: 71 %
Neutro Abs: 4047 cells/uL (ref 1500–7800)
Platelets: 163 10*3/uL (ref 140–400)
RBC: 4.71 MIL/uL (ref 3.80–5.10)
RDW: 15.3 % — ABNORMAL HIGH (ref 11.0–15.0)
WBC: 5.7 10*3/uL (ref 3.8–10.8)

## 2015-09-04 NOTE — Patient Instructions (Signed)
OV in 6 months. 

## 2015-09-04 NOTE — Progress Notes (Addendum)
Subjective:    Patient ID: Teresa [name], female    DOB: [birth year], 58 y.o.   MRN: [id number]  HPI Here today for f/u of her Hepatitis C.  She is genotype 1A. Started tx 06/05/2014 with Harvoni. She finished Harvoni 09/03/2015. She has cleared the virus.  She is doing good.  Appetite is good. No weight loss.  No abdominal pain. No rashes.  No dysphagia. She has a BM daily. No melena or BRRB.  No etoh in greater than 23 yrs. No drugs in greater than 23 year.  Really no GI problems.  [place] Elast. In [place] F0-F1.   07/07/2015 Hep C quaint undetected. CBC    Component Value Date/Time   WBC 4.7 07/07/2015 1335   WBC 5.1 05/01/2015 1311   WBC 7.4 06/08/2014 1137   RBC 4.50 07/07/2015 1335   RBC 4.73 05/01/2015 1311   RBC 5.48 06/08/2014 1137   HGB 12.3 07/07/2015 1335   HGB 13.7 06/08/2014 1137   HCT 37.2 07/07/2015 1335   HCT 39.1 05/01/2015 1311   HCT 44.7 06/08/2014 1137   PLT 132* 07/07/2015 1335   PLT 165 05/01/2015 1311   MCV 82.7 07/07/2015 1335   MCV 83 05/01/2015 1311   MCV 81.5 06/08/2014 1137   MCH 27.3 07/07/2015 1335   MCH 27.9 05/01/2015 1311   MCH 25.0* 06/08/2014 1137   MCHC 33.1 07/07/2015 1335   MCHC 33.8 05/01/2015 1311   MCHC 30.6* 06/08/2014 1137   RDW 14.9 07/07/2015 1335   RDW 14.6 05/01/2015 1311   LYMPHSABS 1457 07/07/2015 1335   LYMPHSABS 1.6 05/01/2015 1311   MONOABS 282 07/07/2015 1335   EOSABS 94 07/07/2015 1335   EOSABS 0.1 05/01/2015 1311   BASOSABS 47 07/07/2015 1335   BASOSABS 0.0 05/01/2015 1311    Hepatic Function Panel     Component Value Date/Time   PROT 6.4 07/07/2015 1332   PROT 6.9 05/01/2015 1311   ALBUMIN 3.8 07/07/2015 1332   ALBUMIN 3.9 05/01/2015 1311   AST 24 07/07/2015 1332   ALT 20 07/07/2015 1332   ALKPHOS 72 07/07/2015 1332   BILITOT 0.7 07/07/2015 1332   BILITOT 1.1 05/01/2015 1311   BILIDIR 0.2 07/07/2015 1332   IBILI 0.5 07/07/2015 1332       Review of Systems Past Medical History  Diagnosis Date  .  Diverticulosis 07-08-2005    colonoscopy  . Hiatal hernia 07-08-2005    EGD  . GERD (gastroesophageal reflux disease) 07-08-2005    EGD  . Irritable bowel syndrome   . Vocal cord polyp     history of  . Degeneration of intervertebral disc, site unspecified   . Osteoarthrosis, unspecified whether generalized or localized, lower leg   . Depressive disorder, not elsewhere classified   . Anxiety state, unspecified   . Full dentures   . Seizures ([place]) 1990s    x 1, unknown cause; no seizures since  . Chronic back pain greater than 3 months duration   . Seasonal allergies   . Papilloma of breast 09/2011    left  . Asthma     prn inhaler  . Esophageal stricture 2014  . Insomnia   . Muscle spasm   . Migraine   . Diverticulitis   . Colon polyp     Past Surgical History  Procedure Laterality Date  . Cholecystectomy    . Bladder surgery      bladder tack  . 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  Cystoscopy  08/24/2011    Procedure: CYSTOSCOPY;  Surgeon: Reece Packer, MD;  Location: [facility];  Service: Urology;  Laterality: N/A;  Cystoscopy,  Rectocele Repair and Vault Prolapse Repair  . Rectocele repair  08/24/2011    Procedure: POSTERIOR REPAIR (RECTOCELE);  Surgeon: Reece Packer, MD;  Location: [facility];  Service: Urology;  Laterality: N/A;  . Vaginal prolapse repair  08/24/2011    Procedure: VAGINAL VAULT SUSPENSION;  Surgeon: Reece Packer, MD;  Location: [facility];  Service: Urology;  Laterality: N/A;  . Abdominal hysterectomy      partial  . Bilateral salpingoophorectomy    . Other surgical history      exc. vocal cord polyp  . Breast biopsy  11/03/2011    Procedure: BREAST BIOPSY WITH NEEDLE LOCALIZATION;  Surgeon: Harl Bowie, MD;  Location: [facility];  Service: General;  Laterality: Left;  needle localized left breast biopsy  . Colonoscopy  07/08/2005    562.10  . Esophagogastroduodenoscopy  10/03/2012    multiple   . Tumor removal       benign  . Rectocelle repair      Allergies  Allergen Reactions  . Lyrica [Pregabalin] Shortness Of Breath  . Trazodone And Nefazodone Shortness Of Breath  . Amitriptyline   . Arthrotec [Diclofenac-Misoprostol] Other (See Comments)    Upset stomach  . Keflex [Cephalexin] Other (See Comments)    Unknown reaction  . Naproxen Other (See Comments)    Irritates stomach  . Prozac [Fluoxetine Hcl] Other (See Comments)    "makes me feel bad"  . Ambien [Zolpidem Tartrate] Other (See Comments)    COULDN'T FUNCTION, STAYED SLEEPY    Current Outpatient Prescriptions on File Prior to Visit  Medication Sig Dispense Refill  . albuterol (PROAIR HFA) 108 (90 Base) MCG/ACT inhaler Inhale 2 puffs into the lungs 4 (four) times daily as needed for wheezing or shortness of breath. 3 Inhaler 1  . cetirizine (ZYRTEC) 10 MG tablet TAKE ONE TABLET EVERY MORNING 30 tablet 11  . ciprofloxacin (CIPRO) 500 MG tablet Take 1 tablet (500 mg total) by mouth 2 (two) times daily. 20 tablet 0  . cyclobenzaprine (FLEXERIL) 10 MG tablet Take 10 mg by mouth every 6 (six) hours as needed for muscle spasms.    . DEXILANT 60 MG capsule TAKE ONE (1) CAPSULE EACH DAY 90 capsule 1  . dicyclomine (BENTYL) 20 MG tablet Take 1 tablet (20 mg total) by mouth every 6 (six) hours as needed for spasms. (Patient not taking: Reported on 07/10/2015) 12 tablet 0  . docusate sodium (COLACE) 100 MG capsule Take 100-200 mg by mouth daily.     . fluocinonide-emollient (LIDEX-E) 0.05 % cream Apply 1 application topically 2 (two) times daily. (Patient not taking: Reported on 07/10/2015) 60 g 5  . hydrOXYzine (ATARAX/VISTARIL) 25 MG tablet Reported on 07/10/2015    . Ledipasvir-Sofosbuvir (HARVONI) 90-400 MG TABS Take by mouth at bedtime.    Marland Kitchen MAGNESIUM PO Take 1 tablet by mouth daily. Reported on 07/10/2015    . metroNIDAZOLE (FLAGYL) 500 MG tablet Take 1 tablet (500 mg total) by mouth 2 (two) times daily. 14 tablet 0  . mometasone (NASONEX) 50  MCG/ACT nasal spray Place 2 sprays into the nose daily. 17 g 12  . montelukast (SINGULAIR) 10 MG tablet TAKE ONE (1) TABLET EACH DAY 90 tablet 1  . NUCYNTA 50 MG TABS tablet Take 50 mg by mouth 3 (three) times daily as needed.     Marland Kitchen  nystatin (MYCOSTATIN) 100000 UNIT/ML suspension Take 5 mLs (500,000 Units total) by mouth 4 (four) times daily. (Patient not taking: Reported on 07/10/2015) 60 mL 0  . polyethylene glycol powder (GLYCOLAX/MIRALAX) powder Take 17 g by mouth 2 (two) times daily as needed. 3350 g 1  . Probiotic Product (PROBIOTIC DAILY PO) Take by mouth.    . promethazine (PHENERGAN) 25 MG tablet Take 25 mg by mouth as needed.     . sertraline (ZOLOFT) 100 MG tablet TAKE ONE (1) TABLET EACH DAY 30 tablet 2  . SYMBICORT 160-4.5 MCG/ACT inhaler USE 2 PUFFS TWICE DAILY 10.2 g 2   No current facility-administered medications on file prior to visit.        Objective:   Physical ExamBlood pressure 122/70, pulse 84, temperature 98.1 F (36.7 C), height 5\' 2"  (1.575 m), weight 152 lb 1.6 oz (68.992 kg).\ Alert and oriented. Skin warm and dry. Oral mucosa is moist.  Full set of dentures. . Sclera anicteric, conjunctivae is pink. Thyroid not enlarged. No cervical lymphadenopathy. Lungs clear. Heart regular rate and rhythm.  Abdomen is soft. Bowel sounds are positive. No hepatomegaly. No abdominal masses felt. No tenderness.  No edema to lower extremities.  Ambulates without any problems.         Assessment & Plan:  Hepatitis C. She has cleared the virus. Will get a CBC, Hepatic function, Hep C quaint.  OV in 1 year. Recall for US abdomen in September. Noted on charge sheet.

## 2015-09-05 LAB — HEPATIC FUNCTION PANEL
ALK PHOS: 80 U/L (ref 33–130)
ALT: 19 U/L (ref 6–29)
AST: 23 U/L (ref 10–35)
Albumin: 4 g/dL (ref 3.6–5.1)
BILIRUBIN INDIRECT: 0.7 mg/dL (ref 0.2–1.2)
Bilirubin, Direct: 0.2 mg/dL (ref <=0.2)
TOTAL PROTEIN: 6.5 g/dL (ref 6.1–8.1)
Total Bilirubin: 0.9 mg/dL (ref 0.2–1.2)

## 2015-09-09 LAB — HEPATITIS C RNA QUANTITATIVE: HCV Quantitative: NOT DETECTED IU/mL (ref <15)

## 2015-09-11 ENCOUNTER — Telehealth (INDEPENDENT_AMBULATORY_CARE_PROVIDER_SITE_OTHER): Payer: Self-pay | Admitting: Internal Medicine

## 2015-09-11 NOTE — Telephone Encounter (Signed)
Results given to patient

## 2015-09-11 NOTE — Telephone Encounter (Signed)
Patient called, returning your call  [phone]

## 2015-10-13 DIAGNOSIS — F5109 Other insomnia not due to a substance or known physiological condition: Secondary | ICD-10-CM | POA: Diagnosis not present

## 2015-10-13 DIAGNOSIS — K219 Gastro-esophageal reflux disease without esophagitis: Secondary | ICD-10-CM | POA: Diagnosis not present

## 2015-10-13 DIAGNOSIS — M545 Low back pain: Secondary | ICD-10-CM | POA: Diagnosis not present

## 2015-10-13 DIAGNOSIS — M5416 Radiculopathy, lumbar region: Secondary | ICD-10-CM | POA: Diagnosis not present

## 2015-10-31 ENCOUNTER — Other Ambulatory Visit: Payer: Self-pay | Admitting: Family

## 2015-10-31 ENCOUNTER — Encounter: Payer: Self-pay | Admitting: Family Medicine

## 2015-11-11 ENCOUNTER — Encounter: Payer: Self-pay | Admitting: Family

## 2015-11-11 ENCOUNTER — Ambulatory Visit (INDEPENDENT_AMBULATORY_CARE_PROVIDER_SITE_OTHER): Payer: Medicare Other | Admitting: Family

## 2015-11-11 ENCOUNTER — Ambulatory Visit: Payer: Medicaid Other | Admitting: Family

## 2015-11-11 VITALS — BP 104/73 | HR 100 | Temp 97.4°F | Ht 62.0 in | Wt 150.2 lb

## 2015-11-11 DIAGNOSIS — J4540 Moderate persistent asthma, uncomplicated: Secondary | ICD-10-CM

## 2015-11-11 DIAGNOSIS — R05 Cough: Secondary | ICD-10-CM | POA: Diagnosis not present

## 2015-11-11 DIAGNOSIS — F329 Major depressive disorder, single episode, unspecified: Secondary | ICD-10-CM

## 2015-11-11 DIAGNOSIS — F32A Depression, unspecified: Secondary | ICD-10-CM

## 2015-11-11 DIAGNOSIS — R059 Cough, unspecified: Secondary | ICD-10-CM

## 2015-11-11 MED ORDER — FLUTICASONE FUROATE-VILANTEROL 100-25 MCG/INH IN AEPB: 1.0000 | INHALATION_SPRAY | Freq: Every day | RESPIRATORY_TRACT | 6 refills | Status: DC

## 2015-11-11 MED ORDER — SERTRALINE HCL 50 MG PO TABS: 50.0000 mg | ORAL_TABLET | Freq: Every day | ORAL | 5 refills | Status: DC

## 2015-11-11 NOTE — Progress Notes (Signed)
Subjective:    Patient ID: Teresa [name], female    DOB: 01-19-1956, 60 y.o.   MRN: [id number]  Pt presents to the office today with cough and headache. Pt states she has not been taking her symbicort daily. Pt would also like her zoloft increased today related to her depression. Pt states her depression is worse and she feels sad, fatigued, and helpless.  Headache   Associated symptoms include coughing and ear pain. Pertinent negatives include no fever, rhinorrhea or sore throat.  Cough  This is a new problem. The current episode started 1 to 4 weeks ago. The problem has been unchanged. The problem occurs every few minutes. The cough is productive of purulent sputum. Associated symptoms include chills, ear congestion, ear pain, headaches, myalgias, postnasal drip, shortness of breath and wheezing. Pertinent negatives include no fever, nasal congestion, rhinorrhea or sore throat. The symptoms are aggravated by lying down. She has tried rest and OTC cough suppressant for the symptoms. The treatment provided mild relief. Her past medical history is significant for asthma and COPD.      Review of Systems  Constitutional: Positive for chills. Negative for fever.  HENT: Positive for ear pain and postnasal drip. Negative for rhinorrhea and sore throat.   Respiratory: Positive for cough, shortness of breath and wheezing.   Musculoskeletal: Positive for myalgias.  Neurological: Positive for headaches.  All other systems reviewed and are negative.      Objective:   Physical Exam  Constitutional: She is oriented to person, place, and time. She appears well-developed and well-nourished. No distress.  HENT:  Head: Normocephalic and atraumatic.  Right Ear: External ear normal.  Left Ear: External ear normal.  Nose: Mucosal edema present.  Mouth/Throat: Posterior oropharyngeal erythema present.  Eyes: Pupils are equal, round, and reactive to light.  Neck: Normal range of motion. Neck supple.  No thyromegaly present.  Cardiovascular: Normal rate, regular rhythm, normal heart sounds and intact distal pulses.   No murmur heard. Pulmonary/Chest: Effort normal and breath sounds normal. No respiratory distress. She has no wheezes.  Abdominal: Soft. Bowel sounds are normal. She exhibits no distension. There is no tenderness.  Musculoskeletal: Normal range of motion. She exhibits no edema or tenderness.  Neurological: She is alert and oriented to person, place, and time. She has normal reflexes. No cranial nerve deficit.  Skin: Skin is warm and dry.  Psychiatric: She has a normal mood and affect. Her behavior is normal. Judgment and thought content normal.  Vitals reviewed.     BP 104/73   Pulse 100   Temp 97.4 F (36.3 C) (Oral)   Ht 5\' 2"  (1.575 m)   Wt 150 lb 3.2 oz (68.1 kg)   BMI 27.47 kg/m      Assessment & Plan:  1. Asthma, chronic, moderate persistent, uncomplicated Symbicort stopped related to noncompliance and started Breo today - fluticasone furoate-vilanterol (BREO ELLIPTA) 100-25 MCG/INH AEPB; Inhale 1 puff into the lungs daily.  Dispense: 1 each; Refill: 6  2. Depression -Zoloft increased to 150 mg daily -Stress management discussed  - sertraline (ZOLOFT) 50 MG tablet; Take 1 tablet (50 mg total) by mouth daily.  Dispense: 90 tablet; Refill: 5  3. Cough -Avoid allergens when possible - fluticasone furoate-vilanterol (BREO ELLIPTA) 100-25 MCG/INH AEPB; Inhale 1 puff into the lungs daily.  Dispense: 1 each; Refill: 6   Continue all meds Health Maintenance reviewed Diet and exercise encouraged RTO as needed and keep chronic follow up appts  [facility]  Lenna Gilford, [place]

## 2015-11-11 NOTE — Patient Instructions (Signed)
Asthma, Adult Asthma is a recurring condition in which the airways tighten and narrow. Asthma can make it difficult to breathe. It can cause coughing, wheezing, and shortness of breath. Asthma episodes, also called asthma attacks, range from minor to life-threatening. Asthma cannot be cured, but medicines and lifestyle changes can help control it. CAUSES Asthma is believed to be caused by inherited (genetic) and environmental factors, but its exact cause is unknown. Asthma may be triggered by allergens, lung infections, or irritants in the air. Asthma triggers are different for each person. Common triggers include:   Animal dander.  Dust mites.  Cockroaches.  Pollen from trees or grass.  Mold.  Smoke.  Air pollutants such as dust, household cleaners, hair sprays, aerosol sprays, paint fumes, strong chemicals, or strong odors.  Cold air, weather changes, and winds (which increase molds and pollens in the air).  Strong emotional expressions such as crying or laughing hard.  Stress.  Certain medicines (such as aspirin) or types of drugs (such as beta-blockers).  Sulfites in foods and drinks. Foods and drinks that may contain sulfites include dried fruit, potato chips, and sparkling grape juice.  Infections or inflammatory conditions such as the flu, a cold, or an inflammation of the nasal membranes (rhinitis).  Gastroesophageal reflux disease (GERD).  Exercise or strenuous activity. SYMPTOMS Symptoms may occur immediately after asthma is triggered or many hours later. Symptoms include:  Wheezing.  Excessive nighttime or early morning coughing.  Frequent or severe coughing with a common cold.  Chest tightness.  Shortness of breath. DIAGNOSIS  The diagnosis of asthma is made by a review of your medical history and a physical exam. Tests may also be performed. These may include:  Lung function studies. These tests show how much air you breathe in and out.  Allergy  tests.  Imaging tests such as X-rays. TREATMENT  Asthma cannot be cured, but it can usually be controlled. Treatment involves identifying and avoiding your asthma triggers. It also involves medicines. There are 2 classes of medicine used for asthma treatment:   Controller medicines. These prevent asthma symptoms from occurring. They are usually taken every day.  Reliever or rescue medicines. These quickly relieve asthma symptoms. They are used as needed and provide short-term relief. Your health care provider will help you create an asthma action plan. An asthma action plan is a written plan for managing and treating your asthma attacks. It includes a list of your asthma triggers and how they may be avoided. It also includes information on when medicines should be taken and when their dosage should be changed. An action plan may also involve the use of a device called a peak flow meter. A peak flow meter measures how well the lungs are working. It helps you monitor your condition. HOME CARE INSTRUCTIONS   Take medicines only as directed by your health care provider. Speak with your health care provider if you have questions about how or when to take the medicines.  Use a peak flow meter as directed by your health care provider. Record and keep track of readings.  Understand and use the action plan to help minimize or stop an asthma attack without needing to seek medical care.  Control your home environment in the following ways to help prevent asthma attacks:  Do not smoke. Avoid being exposed to secondhand smoke.  Change your heating and air conditioning filter regularly.  Limit your use of fireplaces and wood stoves.  Get rid of pests (such as roaches   and mice) and their droppings.  Throw away plants if you see mold on them.  Clean your floors and dust regularly. Use unscented cleaning products.  Try to have someone else vacuum for you regularly. Stay out of rooms while they are  being vacuumed and for a short while afterward. If you vacuum, use a dust mask from a hardware store, a double-layered or microfilter vacuum cleaner bag, or a vacuum cleaner with a HEPA filter.  Replace carpet with wood, tile, or vinyl flooring. Carpet can trap dander and dust.  Use allergy-proof pillows, mattress covers, and box spring covers.  Wash bed sheets and blankets every week in hot water and dry them in a dryer.  Use blankets that are made of polyester or cotton.  Clean bathrooms and kitchens with bleach. If possible, have someone repaint the walls in these rooms with mold-resistant paint. Keep out of the rooms that are being cleaned and painted.  Wash hands frequently. SEEK MEDICAL CARE IF:   You have wheezing, shortness of breath, or a cough even if taking medicine to prevent attacks.  The colored mucus you cough up (sputum) is thicker than usual.  Your sputum changes from clear or white to yellow, green, gray, or bloody.  You have any problems that may be related to the medicines you are taking (such as a rash, itching, swelling, or trouble breathing).  You are using a reliever medicine more than 2-3 times per week.  Your peak flow is still at 50-79% of your personal best after following your action plan for 1 hour.  You have a fever. SEEK IMMEDIATE MEDICAL CARE IF:   You seem to be getting worse and are unresponsive to treatment during an asthma attack.  You are short of breath even at rest.  You get short of breath when doing very little physical activity.  You have difficulty eating, drinking, or talking due to asthma symptoms.  You develop chest pain.  You develop a fast heartbeat.  You have a bluish color to your lips or fingernails.  You are light-headed, dizzy, or faint.  Your peak flow is less than 50% of your personal best.   This information is not intended to replace advice given to you by your health care provider. Make sure you discuss any  questions you have with your health care provider.   Document Released: 02/15/2005 Document Revised: 11/06/2014 Document Reviewed: 09/14/2012 Elsevier Interactive Patient Education 2016 [employer]  

## 2015-11-13 ENCOUNTER — Encounter (INDEPENDENT_AMBULATORY_CARE_PROVIDER_SITE_OTHER): Payer: Self-pay | Admitting: *Deleted

## 2015-12-01 ENCOUNTER — Ambulatory Visit: Payer: Medicaid Other | Admitting: Family

## 2015-12-03 ENCOUNTER — Other Ambulatory Visit: Payer: Self-pay | Admitting: Family

## 2015-12-03 DIAGNOSIS — J4540 Moderate persistent asthma, uncomplicated: Secondary | ICD-10-CM

## 2015-12-04 ENCOUNTER — Telehealth (INDEPENDENT_AMBULATORY_CARE_PROVIDER_SITE_OTHER): Payer: Self-pay | Admitting: *Deleted

## 2015-12-04 NOTE — Telephone Encounter (Signed)
Patient needs 6 mth [place], please put order in

## 2015-12-05 ENCOUNTER — Telehealth (INDEPENDENT_AMBULATORY_CARE_PROVIDER_SITE_OTHER): Payer: Self-pay | Admitting: Internal Medicine

## 2015-12-05 DIAGNOSIS — B1710 Acute hepatitis C without hepatic coma: Secondary | ICD-10-CM

## 2015-12-05 NOTE — Telephone Encounter (Signed)
[place] sch'd 12/12/15 at 1030 (1015), npo after midnight, patient aware

## 2015-12-05 NOTE — Telephone Encounter (Signed)
US RUQ ordered 

## 2015-12-05 NOTE — Telephone Encounter (Signed)
ordered

## 2015-12-08 ENCOUNTER — Encounter: Payer: Self-pay | Admitting: Family

## 2015-12-11 ENCOUNTER — Encounter: Payer: Self-pay | Admitting: Family Medicine

## 2015-12-11 ENCOUNTER — Ambulatory Visit (INDEPENDENT_AMBULATORY_CARE_PROVIDER_SITE_OTHER): Payer: Medicaid Other | Admitting: Family Medicine

## 2015-12-11 VITALS — BP 133/89 | HR 101 | Temp 98.1°F | Ht 62.0 in | Wt 150.4 lb

## 2015-12-11 DIAGNOSIS — S46911A Strain of unspecified muscle, fascia and tendon at shoulder and upper arm level, right arm, initial encounter: Secondary | ICD-10-CM | POA: Diagnosis not present

## 2015-12-11 DIAGNOSIS — J4540 Moderate persistent asthma, uncomplicated: Secondary | ICD-10-CM

## 2015-12-11 MED ORDER — BUDESONIDE-FORMOTEROL FUMARATE 160-4.5 MCG/ACT IN AERO: 2.0000 | INHALATION_SPRAY | Freq: Two times a day (BID) | RESPIRATORY_TRACT | 3 refills | Status: DC

## 2015-12-11 MED ORDER — TIZANIDINE HCL 2 MG PO CAPS: 2.0000 mg | ORAL_CAPSULE | Freq: Three times a day (TID) | ORAL | 1 refills | Status: DC

## 2015-12-11 NOTE — Assessment & Plan Note (Addendum)
Patient did not like the way that breo Ellipta made her mouth feel and would like to go back to Symbicort

## 2015-12-11 NOTE — Progress Notes (Signed)
BP 133/89   Pulse (!) 101   Temp 98.1 F (36.7 C) (Oral)   Ht 5\' 2"  (1.575 m)   Wt 150 lb 6 oz (68.2 kg)   BMI 27.50 kg/m    Subjective:    Patient ID: Teresa [name], female    DOB: [birth year], 60 y.o.   MRN: [id number]  HPI: Teresa [name] is a 60 y.o. female presenting on 12/11/2015 for Discuss Breo (patient reports since changing from [facility] to [facility] she has experienced irritation in her mouth) and Right shoulder pain   HPI Shoulder pain Patient has been having right shoulder pain for the past week. The pain is on top of her right shoulder between her shoulder or neck in the musculature there. She just feels a lot of stiffness but no weakness or numbness. She denies any fevers or chills or overlying skin changes.  Asthma recheck Patient comes in for asthma recheck with the main concern that her [facility] as causing her mouth to feel tingly and numb and she like to go back to her old Symbicort. She says it's working well medically just like her old Symbicort use to but she doesn't like that sensation in her mouth.  Relevant past medical, surgical, family and social history reviewed and updated as indicated. Interim medical history since our last visit reviewed. Allergies and medications reviewed and updated.  Review of Systems  Constitutional: Negative for chills and fever.  HENT: Negative for congestion, ear discharge and ear pain.   Eyes: Negative for redness and visual disturbance.  Respiratory: Negative for cough, chest tightness, shortness of breath and wheezing.   Cardiovascular: Negative for chest pain and leg swelling.  Genitourinary: Negative for difficulty urinating and dysuria.  Musculoskeletal: Positive for myalgias. Negative for arthralgias, back pain, gait problem and joint swelling.  Skin: Negative for rash.  Neurological: Negative for light-headedness and headaches.  Psychiatric/Behavioral: Negative for agitation and behavioral problems.  All other  systems reviewed and are negative.   Per HPI unless specifically indicated above     Medication List       Accurate as of 12/11/15  4:35 PM. Always use your most recent med list.          budesonide-formoterol 160-4.5 MCG/ACT inhaler Commonly known as:  SYMBICORT Inhale 2 puffs into the lungs 2 (two) times daily.   DEXILANT 60 MG capsule Generic drug:  dexlansoprazole TAKE ONE (1) CAPSULE EACH DAY   docusate sodium 100 MG capsule Commonly known as:  COLACE Take 100-200 mg by mouth daily.   hydrOXYzine 25 MG tablet Commonly known as:  ATARAX/VISTARIL Reported on 07/10/2015   MAGNESIUM PO Take 1 tablet by mouth daily. Reported on 07/10/2015   mometasone 50 MCG/ACT nasal spray Commonly known as:  NASONEX Place 2 sprays into the nose daily.   montelukast 10 MG tablet Commonly known as:  SINGULAIR TAKE ONE (1) TABLET EACH DAY   NUCYNTA 50 MG tablet Generic drug:  tapentadol Take 50 mg by mouth 3 (three) times daily as needed.   oxymorphone 10 MG tablet Commonly known as:  OPANA Take 10 mg by mouth 2 (two) times daily before a meal.   polyethylene glycol powder powder Commonly known as:  GLYCOLAX/MIRALAX Take 17 g by mouth 2 (two) times daily as needed.   PROAIR HFA 108 (90 Base) MCG/ACT inhaler Generic drug:  albuterol INHALE 2 PUFFS EVERY 4 HOURS AS NEEDED   PROBIOTIC DAILY PO Take by mouth.   promethazine 25  MG tablet Commonly known as:  PHENERGAN Take 25 mg by mouth as needed.   sertraline 100 MG tablet Commonly known as:  ZOLOFT TAKE ONE (1) TABLET EACH DAY   sertraline 50 MG tablet Commonly known as:  ZOLOFT Take 1 tablet (50 mg total) by mouth daily.   tizanidine 2 MG capsule Commonly known as:  ZANAFLEX Take 1 capsule (2 mg total) by mouth 3 (three) times daily.          Objective:    BP 133/89   Pulse (!) 101   Temp 98.1 F (36.7 C) (Oral)   Ht 5\' 2"  (1.575 m)   Wt 150 lb 6 oz (68.2 kg)   BMI 27.50 kg/m   Wt Readings from  Last 3 Encounters:  12/11/15 150 lb 6 oz (68.2 kg)  11/11/15 150 lb 3.2 oz (68.1 kg)  09/04/15 152 lb 1.6 oz (69 kg)    Physical Exam  Constitutional: She is oriented to person, place, and time. She appears well-developed and well-nourished. No distress.  HENT:  Right Ear: External ear normal.  Left Ear: External ear normal.  Nose: Nose normal.  Mouth/Throat: Oropharynx is clear and moist. No oropharyngeal exudate.  Eyes: Conjunctivae are normal.  Cardiovascular: Normal rate, regular rhythm, normal heart sounds and intact distal pulses.   No murmur heard. Pulmonary/Chest: Effort normal and breath sounds normal. No respiratory distress. She has no wheezes. She has no rales.  Musculoskeletal: Normal range of motion. She exhibits no edema.       Right shoulder: She exhibits tenderness (Tenderness over the musculature between her shoulder or neck. No tenderness in the shoulder or in neck itself). She exhibits normal range of motion, no bony tenderness, no swelling, no deformity and normal strength.  Neurological: She is alert and oriented to person, place, and time. Coordination normal.  Skin: Skin is warm and dry. No rash noted. She is not diaphoretic.  Psychiatric: She has a normal mood and affect. Her behavior is normal.  Nursing note and vitals reviewed.     Assessment & Plan:   Problem List Items Addressed This Visit      Respiratory   Asthma, chronic - Primary    Patient did not like the way that breo Ellipta made her mouth feel and would like to go back to Symbicort      Relevant Medications   budesonide-formoterol (SYMBICORT) 160-4.5 MCG/ACT inhaler    Other Visit Diagnoses    Muscle strain of right shoulder region, initial encounter       Relevant Medications   tizanidine (ZANAFLEX) 2 MG capsule       Follow up plan: Return if symptoms worsen or fail to improve.  Counseling provided for all of the vaccine components No orders of the defined types were placed in  this encounter.   Caryl Pina, MD [facility] 12/11/2015, 4:35 PM

## 2015-12-12 ENCOUNTER — Ambulatory Visit (HOSPITAL_COMMUNITY)
Admission: RE | Admit: 2015-12-12 | Discharge: 2015-12-12 | Disposition: A | Discharge status: Home / Self Care | Payer: Medicare Other | Source: Ambulatory Visit | Attending: Internal Medicine | Admitting: Internal Medicine

## 2015-12-12 DIAGNOSIS — B1710 Acute hepatitis C without hepatic coma: Secondary | ICD-10-CM | POA: Diagnosis present

## 2015-12-12 DIAGNOSIS — R932 Abnormal findings on diagnostic imaging of liver and biliary tract: Secondary | ICD-10-CM | POA: Insufficient documentation

## 2015-12-15 ENCOUNTER — Telehealth (INDEPENDENT_AMBULATORY_CARE_PROVIDER_SITE_OTHER): Payer: Self-pay | Admitting: Internal Medicine

## 2015-12-15 DIAGNOSIS — M5416 Radiculopathy, lumbar region: Secondary | ICD-10-CM | POA: Diagnosis not present

## 2015-12-15 DIAGNOSIS — M545 Low back pain: Secondary | ICD-10-CM | POA: Diagnosis not present

## 2015-12-15 DIAGNOSIS — K219 Gastro-esophageal reflux disease without esophagitis: Secondary | ICD-10-CM | POA: Diagnosis not present

## 2015-12-15 DIAGNOSIS — F5109 Other insomnia not due to a substance or known physiological condition: Secondary | ICD-10-CM | POA: Diagnosis not present

## 2015-12-17 ENCOUNTER — Encounter: Payer: Self-pay | Admitting: Family Medicine

## 2015-12-17 DIAGNOSIS — R202 Paresthesia of skin: Secondary | ICD-10-CM | POA: Diagnosis not present

## 2015-12-17 DIAGNOSIS — I10 Essential (primary) hypertension: Secondary | ICD-10-CM | POA: Diagnosis not present

## 2015-12-17 DIAGNOSIS — J45909 Unspecified asthma, uncomplicated: Secondary | ICD-10-CM | POA: Diagnosis not present

## 2015-12-17 DIAGNOSIS — J449 Chronic obstructive pulmonary disease, unspecified: Secondary | ICD-10-CM | POA: Diagnosis not present

## 2015-12-17 DIAGNOSIS — K759 Inflammatory liver disease, unspecified: Secondary | ICD-10-CM | POA: Diagnosis not present

## 2015-12-17 DIAGNOSIS — R030 Elevated blood-pressure reading, without diagnosis of hypertension: Secondary | ICD-10-CM | POA: Diagnosis not present

## 2015-12-17 DIAGNOSIS — R079 Chest pain, unspecified: Secondary | ICD-10-CM | POA: Diagnosis not present

## 2015-12-17 DIAGNOSIS — R0789 Other chest pain: Secondary | ICD-10-CM | POA: Diagnosis not present

## 2015-12-17 DIAGNOSIS — J40 Bronchitis, not specified as acute or chronic: Secondary | ICD-10-CM | POA: Diagnosis not present

## 2015-12-17 DIAGNOSIS — M79601 Pain in right arm: Secondary | ICD-10-CM | POA: Diagnosis not present

## 2015-12-17 DIAGNOSIS — R200 Anesthesia of skin: Secondary | ICD-10-CM | POA: Diagnosis not present

## 2015-12-17 DIAGNOSIS — M79602 Pain in left arm: Secondary | ICD-10-CM | POA: Diagnosis not present

## 2015-12-17 DIAGNOSIS — I639 Cerebral infarction, unspecified: Secondary | ICD-10-CM | POA: Diagnosis not present

## 2015-12-18 NOTE — Telephone Encounter (Signed)
error 

## 2015-12-23 ENCOUNTER — Encounter: Payer: Self-pay | Admitting: Family

## 2015-12-23 ENCOUNTER — Ambulatory Visit (INDEPENDENT_AMBULATORY_CARE_PROVIDER_SITE_OTHER): Payer: Medicare Other | Admitting: Family

## 2015-12-23 VITALS — BP 134/86 | HR 99 | Temp 97.1°F | Ht 62.0 in | Wt 150.0 lb

## 2015-12-23 DIAGNOSIS — Z09 Encounter for follow-up examination after completed treatment for conditions other than malignant neoplasm: Secondary | ICD-10-CM | POA: Diagnosis not present

## 2015-12-23 DIAGNOSIS — R4589 Other symptoms and signs involving emotional state: Secondary | ICD-10-CM

## 2015-12-23 DIAGNOSIS — I10 Essential (primary) hypertension: Secondary | ICD-10-CM | POA: Diagnosis not present

## 2015-12-23 MED ORDER — LISINOPRIL 10 MG PO TABS: 10.0000 mg | ORAL_TABLET | Freq: Every day | ORAL | 1 refills | Status: DC

## 2015-12-23 NOTE — Patient Instructions (Signed)
Hypertension Hypertension, commonly called high blood pressure, is when the force of blood pumping through your arteries is too strong. Your arteries are the blood vessels that carry blood from your heart throughout your body. A blood pressure reading consists of a higher number over a lower number, such as 110/72. The higher number (systolic) is the pressure inside your arteries when your heart pumps. The lower number (diastolic) is the pressure inside your arteries when your heart relaxes. Ideally you want your blood pressure below 120/80. Hypertension forces your heart to work harder to pump blood. Your arteries may become narrow or stiff. Having untreated or uncontrolled hypertension can cause heart attack, stroke, kidney disease, and other problems. RISK FACTORS Some risk factors for high blood pressure are controllable. Others are not.  Risk factors you cannot control include:   Race. You may be at higher risk if you are African American.  Age. Risk increases with age.  Gender. Men are at higher risk than women before age 45 years. After age 65, women are at higher risk than men. Risk factors you can control include:  Not getting enough exercise or physical activity.  Being overweight.  Getting too much fat, sugar, calories, or salt in your diet.  Drinking too much alcohol. SIGNS AND SYMPTOMS Hypertension does not usually cause signs or symptoms. Extremely high blood pressure (hypertensive crisis) may cause headache, anxiety, shortness of breath, and nosebleed. DIAGNOSIS To check if you have hypertension, your health care provider will measure your blood pressure while you are seated, with your arm held at the level of your heart. It should be measured at least twice using the same arm. Certain conditions can cause a difference in blood pressure between your right and left arms. A blood pressure reading that is higher than normal on one occasion does not mean that you need treatment. If  it is not clear whether you have high blood pressure, you may be asked to return on a different day to have your blood pressure checked again. Or, you may be asked to monitor your blood pressure at home for 1 or more weeks. TREATMENT Treating high blood pressure includes making lifestyle changes and possibly taking medicine. Living a healthy lifestyle can help lower high blood pressure. You may need to change some of your habits. Lifestyle changes may include:  Following the DASH diet. This diet is high in fruits, vegetables, and whole grains. It is low in salt, red meat, and added sugars.  Keep your sodium intake below 2,300 mg per day.  Getting at least 30-45 minutes of aerobic exercise at least 4 times per week.  Losing weight if necessary.  Not smoking.  Limiting alcoholic beverages.  Learning ways to reduce stress. Your health care provider may prescribe medicine if lifestyle changes are not enough to get your blood pressure under control, and if one of the following is true:  You are 18-59 years of age and your systolic blood pressure is above 140.  You are 60 years of age or older, and your systolic blood pressure is above 150.  Your diastolic blood pressure is above 90.  You have diabetes, and your systolic blood pressure is over 140 or your diastolic blood pressure is over 90.  You have kidney disease and your blood pressure is above 140/90.  You have heart disease and your blood pressure is above 140/90. Your personal target blood pressure may vary depending on your medical conditions, your age, and other factors. HOME CARE INSTRUCTIONS    Have your blood pressure rechecked as directed by your health care provider.   Take medicines only as directed by your health care provider. Follow the directions carefully. Blood pressure medicines must be taken as prescribed. The medicine does not work as well when you skip doses. Skipping doses also puts you at risk for  problems.  Do not smoke.   Monitor your blood pressure at home as directed by your health care provider. SEEK MEDICAL CARE IF:   You think you are having a reaction to medicines taken.  You have recurrent headaches or feel dizzy.  You have swelling in your ankles.  You have trouble with your vision. SEEK IMMEDIATE MEDICAL CARE IF:  You develop a severe headache or confusion.  You have unusual weakness, numbness, or feel faint.  You have severe chest or abdominal pain.  You vomit repeatedly.  You have trouble breathing. MAKE SURE YOU:   Understand these instructions.  Will watch your condition.  Will get help right away if you are not doing well or get worse.   This information is not intended to replace advice given to you by your health care provider. Make sure you discuss any questions you have with your health care provider.   Document Released: 02/15/2005 Document Revised: 07/02/2014 Document Reviewed: 12/08/2012 Elsevier Interactive Patient Education 2016 Elsevier Inc.  

## 2015-12-23 NOTE — Progress Notes (Signed)
   Subjective:    Patient ID: Teresa [name], female    DOB: 06/03/1955, 60 y.o.   MRN: [id number]  HPI PT presents to the office today for hospital follow up for weakness and generalized pain. PT states she was told her BP was elevated and started on Lisinopril 5 mg daily.  Pt states her blood pressure had been elevated in the evening so she took another dose.   Pt went to the ED for pain in left arm, pain in right arm, chest pain. PT had negative EKG, negative chest x-ray,  and CT of head negative for bleed. CMP, CBC, INR, D Dimer, BNP, and urine was negative.  Pt is followed by Pain management every 1-3 months. Pt is currently taking oxymorphone 10 mg BID. Pt states she continues to be anxious.  *Hosptial notes reviewed and will scan into chart.  Review of Systems  Musculoskeletal: Positive for back pain and myalgias.  Psychiatric/Behavioral: Positive for behavioral problems. The patient is nervous/anxious and is hyperactive.   All other systems reviewed and are negative.      Objective:   Physical Exam  Constitutional: She is oriented to person, place, and time. She appears well-developed and well-nourished. No distress.  HENT:  Head: Normocephalic.  Eyes: Pupils are equal, round, and reactive to light.  Neck: Normal range of motion. Neck supple. No thyromegaly present.  Cardiovascular: Normal rate, regular rhythm, normal heart sounds and intact distal pulses.   No murmur heard. Pulmonary/Chest: Effort normal and breath sounds normal. No respiratory distress. She has no wheezes.  Abdominal: Soft. Bowel sounds are normal. She exhibits no distension. There is no tenderness.  Musculoskeletal: Normal range of motion. She exhibits no edema or tenderness.  Neurological: She is alert and oriented to person, place, and time. She has normal reflexes. No cranial nerve deficit.  Skin: Skin is warm and dry.  Psychiatric: Her speech is normal. Judgment and thought content normal. Her mood  appears anxious. She is hyperactive.  PT is very talkative   Vitals reviewed.     BP 134/86   Pulse 99   Temp 97.1 F (36.2 C) (Oral)   Ht 5' 2" (1.575 m)   Wt 150 lb (68 kg)   BMI 27.44 kg/m      Assessment & Plan:  1. Essential hypertension Lisinopril increased to 10 mg today from 5 mg -Dash diet information given -Exercise encouraged - Stress Management  -Continue current meds -RTO in 2 months  - CMP14+EGFR - lisinopril (PRINIVIL) 10 MG tablet; Take 1 tablet (10 mg total) by mouth daily.  Dispense: 30 tablet; Refill: 1  2. Anxious appearance Related to being over medicated?  - CMP14+EGFR  3. Hospital discharge follow-up - CMP14+EGFR   Keep appt with Pain Clinic, pt was reviewed in [facility] database and has only received medication from pain management. Long discussion with patient she should only take medication as prescribed! Do not increase BP medication without call office or follow up first! RTO in 2 weeks  Christy Hawks, FNP  

## 2015-12-24 LAB — CMP14+EGFR
A/G RATIO: 1.3 (ref 1.2–2.2)
ALT: 18 IU/L (ref 0–32)
AST: 18 IU/L (ref 0–40)
Albumin: 3.9 g/dL (ref 3.5–5.5)
Alkaline Phosphatase: 79 IU/L (ref 39–117)
BUN/Creatinine Ratio: 10 (ref 9–23)
BUN: 8 mg/dL (ref 6–24)
Bilirubin Total: 0.9 mg/dL (ref 0.0–1.2)
CALCIUM: 9.2 mg/dL (ref 8.7–10.2)
CO2: 27 mmol/L (ref 18–29)
CREATININE: 0.78 mg/dL (ref 0.57–1.00)
Chloride: 102 mmol/L (ref 96–106)
GFR, EST AFRICAN AMERICAN: 97 mL/min/{1.73_m2} (ref >59)
GFR, EST NON AFRICAN AMERICAN: 84 mL/min/{1.73_m2} (ref >59)
Globulin, Total: 3 g/dL (ref 1.5–4.5)
Glucose: 104 mg/dL — ABNORMAL HIGH (ref 65–99)
POTASSIUM: 4.1 mmol/L (ref 3.5–5.2)
Sodium: 143 mmol/L (ref 134–144)
TOTAL PROTEIN: 6.9 g/dL (ref 6.0–8.5)

## 2015-12-26 ENCOUNTER — Encounter: Payer: Self-pay | Admitting: Family Medicine

## 2015-12-26 ENCOUNTER — Ambulatory Visit (INDEPENDENT_AMBULATORY_CARE_PROVIDER_SITE_OTHER): Payer: Medicare Other | Admitting: Family Medicine

## 2015-12-26 VITALS — BP 124/84 | HR 109 | Temp 96.8°F | Ht 62.0 in | Wt 146.6 lb

## 2015-12-26 DIAGNOSIS — K5732 Diverticulitis of large intestine without perforation or abscess without bleeding: Secondary | ICD-10-CM | POA: Diagnosis not present

## 2015-12-26 DIAGNOSIS — F32A Depression, unspecified: Secondary | ICD-10-CM

## 2015-12-26 DIAGNOSIS — F329 Major depressive disorder, single episode, unspecified: Secondary | ICD-10-CM

## 2015-12-26 DIAGNOSIS — F411 Generalized anxiety disorder: Secondary | ICD-10-CM | POA: Diagnosis not present

## 2015-12-26 MED ORDER — AMOXICILLIN-POT CLAVULANATE 875-125 MG PO TABS: 1.0000 | ORAL_TABLET | Freq: Two times a day (BID) | ORAL | 0 refills | Status: DC

## 2015-12-26 MED ORDER — CITALOPRAM HYDROBROMIDE 10 MG PO TABS: 10.0000 mg | ORAL_TABLET | Freq: Every day | ORAL | 0 refills | Status: DC

## 2015-12-26 NOTE — Patient Instructions (Signed)
Great to see you!  I have sent in augmentin for your abdominal pain.   I have started citalopram to help your depression. I believe anxiety and depression are causing your difficulty sleeping. If you treat these I think your sleeping will improve.  Also try 5 mg of melatonin at bedtime

## 2015-12-26 NOTE — Progress Notes (Signed)
   HPI  Patient presents today here with trouble sleeping and depression, patient also explains that she's had severe abdominal pain for 3 days.  Abdominal pain Consistent with previous episodes of diverticulitis Describes chills, subjective fever, left lower quadrant crampy severe pain Also some diarrhea. She's tolerating food and fluids normally. She requests antibiotics to treat diverticulitis.  Difficulty sleeping Has tingling on for years, states that over the last few months has been getting worse. Has tried Benadryl with no improvement. Has an allergy to trazodone.  Depression Denies suicidal thoughts States that she's going through many stressful life events currently. She has had bad reaction to Prozac previously  PMH: Smoking status noted ROS: Per HPI  Objective: BP 124/84   Pulse (!) 109   Temp (!) 96.8 F (36 C) (Oral)   Ht 5\' 2"  (1.575 m)   Wt 146 lb 9.6 oz (66.5 kg)   BMI 26.81 kg/m  Gen: NAD, alert, cooperative with exam HEENT: NCAT CV: RRR, good S1/S2, no murmur Resp: CTABL, no wheezes, non-labored Abd: Soft, no guarding, positive bowel sounds, left lower quadrant slightly tender to palpation Ext: No edema, warm Neuro: Alert and oriented, No gross deficits  Psych: No suicidal thoughts Odd affect, appears suspicious of me  Assessment and plan:  # Abdominal pain, diverticulitis Clinical diagnosis based on patient's previous episodes as well Treating with Augmentin Physical exam is reassuring Discussed with patient low threshold for returning for seeking further medical care  # Depression Likely this is the root cause of the trouble sleeping Treating with Celexa, start at 10 mg and increase very slowly. Denies suicidal thoughts Follow-up 3-4 weeks with me or her PCP  # Difficulty sleeping I think depression is likely the root cause, start Celexa Try melatonin Follow-up 3-4 weeks     Meds ordered this encounter  Medications  .  amoxicillin-clavulanate (AUGMENTIN) 875-125 MG tablet    Sig: Take 1 tablet by mouth 2 (two) times daily.    Dispense:  20 tablet    Refill:  0  . citalopram (CELEXA) 10 MG tablet    Sig: Take 1 tablet (10 mg total) by mouth daily.    Dispense:  30 tablet    Refill:  0    Laroy Apple, MD [facility] 12/26/2015, 5:34 PM

## 2016-01-08 ENCOUNTER — Ambulatory Visit: Payer: Medicare Other | Admitting: Family

## 2016-01-16 ENCOUNTER — Other Ambulatory Visit: Payer: Self-pay | Admitting: Family

## 2016-01-16 NOTE — Telephone Encounter (Signed)
Pt dropped a couple of her lisinopril. Her Rx from 10/24 has a refill to it. [facility] see if it can be filled now.

## 2016-01-17 ENCOUNTER — Other Ambulatory Visit: Payer: Self-pay | Admitting: Family

## 2016-01-17 DIAGNOSIS — I10 Essential (primary) hypertension: Secondary | ICD-10-CM

## 2016-01-19 ENCOUNTER — Other Ambulatory Visit: Payer: Self-pay | Admitting: Family

## 2016-01-19 MED ORDER — CITALOPRAM HYDROBROMIDE 10 MG PO TABS: 10.0000 mg | ORAL_TABLET | Freq: Every day | ORAL | 0 refills | Status: DC

## 2016-01-19 NOTE — Telephone Encounter (Signed)
Refilled celexa  Laroy Apple, MD [facility] 01/19/2016, 2:58 PM

## 2016-01-19 NOTE — Telephone Encounter (Signed)
Patient aware.

## 2016-01-26 ENCOUNTER — Ambulatory Visit: Payer: Medicare Other | Admitting: Family Medicine

## 2016-01-27 ENCOUNTER — Encounter: Payer: Self-pay | Admitting: Family Medicine

## 2016-01-27 ENCOUNTER — Ambulatory Visit (INDEPENDENT_AMBULATORY_CARE_PROVIDER_SITE_OTHER): Payer: Medicare Other | Admitting: Family Medicine

## 2016-01-27 ENCOUNTER — Encounter: Payer: Self-pay | Admitting: Family

## 2016-01-27 VITALS — BP 135/87 | HR 99 | Temp 97.4°F | Ht 62.0 in | Wt 151.8 lb

## 2016-01-27 DIAGNOSIS — F329 Major depressive disorder, single episode, unspecified: Secondary | ICD-10-CM | POA: Diagnosis not present

## 2016-01-27 DIAGNOSIS — Z23 Encounter for immunization: Secondary | ICD-10-CM | POA: Diagnosis not present

## 2016-01-27 DIAGNOSIS — F32A Depression, unspecified: Secondary | ICD-10-CM

## 2016-01-27 MED ORDER — CITALOPRAM HYDROBROMIDE 20 MG PO TABS: 20.0000 mg | ORAL_TABLET | Freq: Every day | ORAL | 1 refills | Status: DC

## 2016-01-27 NOTE — Progress Notes (Signed)
   HPI  Patient presents today to follow-up for depression and anxiety, patient also has a health survey from her insurance company to review.  Depression and anxiety Started Celexa 10 mg, tolerating well, she actually feels like it's helping. Previously she has had several side effects and intolerances to SSRIs. She denies any suicidal thoughts. She like to come up on the dose.  Her 58 year old grandson was recently found dead in bed, she has been going through a lot of social stress lately. She feels she is doing well, as expected with the death. She has lots of family support  PMH: Smoking status noted ROS: Per HPI  Objective: BP 135/87   Pulse 99   Temp 97.4 F (36.3 C) (Oral)   Ht 5\' 2"  (1.575 m)   Wt 151 lb 12.8 oz (68.9 kg)   BMI 27.76 kg/m  Gen: NAD, alert, cooperative with exam HEENT: NCAT CV: RRR, good S1/S2, no murmur Resp: CTABL, no wheezes, non-labored Ext: No edema, warm Neuro: Alert and oriented, No gross deficits  Psych:  Appropriate mood and affect  Depression screen [facility] 2/9 01/27/2016 12/26/2015 12/23/2015 12/11/2015 11/11/2015  Decreased Interest 1 2 0 0 1  Down, Depressed, Hopeless 1 3 1 1 1   PHQ - 2 Score 2 5 1 1 2   Altered sleeping 2 3 - - 3  Tired, decreased energy 2 3 - - 3  Change in appetite 1 1 - - 0  Feeling bad or failure about yourself  1 2 - - 0  Trouble concentrating 1 1 - - 1  Moving slowly or fidgety/restless 0 3 - - 0  Suicidal thoughts 0 0 - - 0  PHQ-9 Score 9 18 - - 9  Difficult doing work/chores Not difficult at all Somewhat difficult - - Somewhat difficult    Assessment and plan:  # Depression, anxiety Depression improving with citalopram Patient will like to increase dose, titrated 20 mg, 40 mg in 2 weeks if tolerating well. Follow-up in 2 months. Patient will call for refill/new dose if she increases the dose.  # HCM Immunizations for influenza and tetanus given today, counseling provided for all vaccine  components Should have ABIs performed by a nurse practitioner from her insurance company, they were normal, left was 1.14, right was 1.05 Patient will discuss advanced directives with family members and consider filling out paperwork.    Meds ordered this encounter  Medications  . citalopram (CELEXA) 20 MG tablet    Sig: Take 1 tablet (20 mg total) by mouth daily.    Dispense:  30 tablet    Refill:  [place], MD [facility] 01/27/2016, 2:05 PM

## 2016-01-27 NOTE — Patient Instructions (Signed)
Great to see you!  I have increased your medicine to 20 mg once daily, if you are tolerating it well in 2 weeks you may increase to 40 mg ( 2 tabs) daily, call if you do and I can send a new Rx.   Lets follow up in 2 months

## 2016-02-17 ENCOUNTER — Other Ambulatory Visit: Payer: Self-pay | Admitting: Family Medicine

## 2016-02-19 ENCOUNTER — Ambulatory Visit: Payer: Medicare Other | Admitting: Family Medicine

## 2016-02-26 DIAGNOSIS — M545 Low back pain: Secondary | ICD-10-CM | POA: Diagnosis not present

## 2016-02-26 DIAGNOSIS — M5416 Radiculopathy, lumbar region: Secondary | ICD-10-CM | POA: Diagnosis not present

## 2016-02-26 DIAGNOSIS — F5109 Other insomnia not due to a substance or known physiological condition: Secondary | ICD-10-CM | POA: Diagnosis not present

## 2016-02-26 DIAGNOSIS — Z79891 Long term (current) use of opiate analgesic: Secondary | ICD-10-CM | POA: Diagnosis not present

## 2016-03-04 ENCOUNTER — Encounter: Payer: Self-pay | Admitting: Family Medicine

## 2016-03-04 ENCOUNTER — Ambulatory Visit (INDEPENDENT_AMBULATORY_CARE_PROVIDER_SITE_OTHER): Payer: Medicare Other | Admitting: Family Medicine

## 2016-03-04 VITALS — BP 139/85 | HR 101 | Temp 97.6°F | Ht 62.0 in | Wt 160.4 lb

## 2016-03-04 DIAGNOSIS — F32A Depression, unspecified: Secondary | ICD-10-CM

## 2016-03-04 DIAGNOSIS — R739 Hyperglycemia, unspecified: Secondary | ICD-10-CM

## 2016-03-04 DIAGNOSIS — F329 Major depressive disorder, single episode, unspecified: Secondary | ICD-10-CM | POA: Diagnosis not present

## 2016-03-04 DIAGNOSIS — E663 Overweight: Secondary | ICD-10-CM

## 2016-03-04 DIAGNOSIS — F411 Generalized anxiety disorder: Secondary | ICD-10-CM | POA: Diagnosis not present

## 2016-03-04 LAB — BAYER DCA HB A1C WAIVED: HB A1C: 6 % (ref <7.0)

## 2016-03-04 MED ORDER — CITALOPRAM HYDROBROMIDE 40 MG PO TABS: 40.0000 mg | ORAL_TABLET | Freq: Every day | ORAL | 1 refills | Status: DC

## 2016-03-04 NOTE — Progress Notes (Signed)
HPI  Patient presents today for follow-up chronic medical conditions, anxiety, depression, overweight, and concerns about elevated blood sugar.  Patient states at home they checked her blood sugar and it was over 170. This was random, nonfasting  Patient denies any suicidal thoughts and states that her anxiety and depression are much better on the medication. She's tolerating medication without side effects. She is prompted to 40 mg without side effects, requesting a refill.  Patient also requests lab testing today to check out her blood sugar. She states that she has only drink a small amount of ginger ale about one hour ago.  PMH: Smoking status noted ROS: Per HPI  Objective: BP 139/85   Pulse (!) 101   Temp 97.6 F (36.4 C) (Oral)   Ht 5' 2" (1.575 m)   Wt 160 lb 6.4 oz (72.8 kg)   BMI 29.34 kg/m  Gen: NAD, alert, cooperative with exam HEENT: NCAT CV: RRR, good S1/S2, no murmur Resp: CTABL, no wheezes, non-labored Ext: No edema, warm Neuro: Alert and oriented, No gross deficits  Depression screen PHQ 2/9 03/04/2016 01/27/2016 12/26/2015 12/23/2015 12/11/2015  Decreased Interest 1 1 2 0 0  Down, Depressed, Hopeless 0 1 3 1 1  PHQ - 2 Score 1 2 5 1 1  Altered sleeping - 2 3 - -  Tired, decreased energy - 2 3 - -  Change in appetite - 1 1 - -  Feeling bad or failure about yourself  - 1 2 - -  Trouble concentrating - 1 1 - -  Moving slowly or fidgety/restless - 0 3 - -  Suicidal thoughts - 0 0 - -  PHQ-9 Score - 9 18 - -  Difficult doing work/chores - Not difficult at all Somewhat difficult - -   GAD 7 : Generalized Anxiety Score 03/04/2016  Nervous, Anxious, on Edge 0  Control/stop worrying 0  Worry too much - different things 0  Trouble relaxing 1  Restless 0  Easily annoyed or irritable 0  Afraid - awful might happen 0  Total GAD 7 Score 1      Assessment and plan:  # Generalized anxiety disorder, depression Improved on Celexa, continue Note also that  she's being given Valium by her pain management doctor, 5 mg 1-2 pills at night. Follow-up 3-4 months with us  # Elevated blood sugar With overweight I am concerned about prediabetes or diabetes Blood work today including A1c, not truly fasting  # Overweight Weight slowly trending up, labs including lipid panel and A1c   List of psychiatrists were placed on her AVS because she asked for a hotline for her grandson is 24 years old and appears to be bipolar. I recommended [facility] or [facility] as good places for treatment    Orders Placed This Encounter  Procedures  . Bayer DCA Hb A1c Waived  . CMP14+EGFR  . Lipid panel    Meds ordered this encounter  Medications  . citalopram (CELEXA) 40 MG tablet    Sig: Take 1 tablet (40 mg total) by mouth daily.    Dispense:  90 tablet    Refill:  1    Sam Bradshaw, MD [facility] 03/04/2016, 4:10 PM     

## 2016-03-04 NOTE — Patient Instructions (Addendum)
Great to see you!  We will call with lab results within 1 week.   Take 1 tablet of celexa once daily.   Come back in 3-4 months   Your provider wants you to schedule an appointment with a Psychologist/Psychiatrist. The following list of offices requires the patient to call and make their own appointment, as there is information they need that only you can provide. Please feel free to choose form the following providers:  [facility] in [place]  [place]  [phone] [place], [place]  (Scheduled through [facility]) Must call and do an interview for appointment. Sees Children / Accepts Medicaid  [facility] in [place]  [address] [place]    [place], [place]  [phone] [place], [place] for Autism but does not treat it Sees Children / Accepts Medicaid  [facility]    [phone] [address], [place], [place] Medication management, substance abuse, bipolar, grief, family, marriage, OCD, anxiety, PTSD Sees children / Accepts Medicaid  [facility]    [phone] [address], [place], [place] children / Accepts [facility]  [facility]  [phone] [address] [place], [place]   Dr Lorenza Evangelist     [phone] [address] [place], [place]  Sees ADD & ADHD for treatment Accepts Medicaid  [place]  [phone] [address] Arlean Hopping, [place] for Autism Accepts [facility]  [facility]   [phone] [place], [place]  Does Adult ADD evaluations Does not accept Medicaid  [facility]   [phone] [place], [place] therapy  Sees children as young as 65 years old Accepts Medicaid

## 2016-03-05 LAB — CMP14+EGFR
A/G RATIO: 1.7 (ref 1.2–2.2)
ALBUMIN: 4.1 g/dL (ref 3.5–5.5)
ALT: 28 IU/L (ref 0–32)
AST: 26 IU/L (ref 0–40)
Alkaline Phosphatase: 80 IU/L (ref 39–117)
BILIRUBIN TOTAL: 0.8 mg/dL (ref 0.0–1.2)
BUN / CREAT RATIO: 8 — AB (ref 9–23)
BUN: 6 mg/dL (ref 6–24)
CO2: 26 mmol/L (ref 18–29)
Calcium: 8.9 mg/dL (ref 8.7–10.2)
Chloride: 100 mmol/L (ref 96–106)
Creatinine, Ser: 0.76 mg/dL (ref 0.57–1.00)
GFR calc non Af Amer: 87 mL/min/{1.73_m2} (ref >59)
GFR, EST AFRICAN AMERICAN: 100 mL/min/{1.73_m2} (ref >59)
GLOBULIN, TOTAL: 2.4 g/dL (ref 1.5–4.5)
Glucose: 107 mg/dL — ABNORMAL HIGH (ref 65–99)
POTASSIUM: 4 mmol/L (ref 3.5–5.2)
SODIUM: 140 mmol/L (ref 134–144)
TOTAL PROTEIN: 6.5 g/dL (ref 6.0–8.5)

## 2016-03-05 LAB — LIPID PANEL
CHOL/HDL RATIO: 2 ratio (ref 0.0–4.4)
Cholesterol, Total: 118 mg/dL (ref 100–199)
HDL: 58 mg/dL (ref >39)
LDL Calculated: 40 mg/dL (ref 0–99)
Triglycerides: 98 mg/dL (ref 0–149)
VLDL Cholesterol Cal: 20 mg/dL (ref 5–40)

## 2016-03-10 ENCOUNTER — Other Ambulatory Visit: Payer: Self-pay | Admitting: Family

## 2016-03-10 ENCOUNTER — Other Ambulatory Visit: Payer: Self-pay | Admitting: Family Medicine

## 2016-03-10 DIAGNOSIS — K5904 Chronic idiopathic constipation: Secondary | ICD-10-CM

## 2016-03-11 ENCOUNTER — Ambulatory Visit (INDEPENDENT_AMBULATORY_CARE_PROVIDER_SITE_OTHER): Payer: Medicare Other | Admitting: Internal Medicine

## 2016-03-11 ENCOUNTER — Encounter (INDEPENDENT_AMBULATORY_CARE_PROVIDER_SITE_OTHER): Payer: Self-pay | Admitting: Internal Medicine

## 2016-03-26 DIAGNOSIS — M5416 Radiculopathy, lumbar region: Secondary | ICD-10-CM | POA: Diagnosis not present

## 2016-03-26 DIAGNOSIS — F5109 Other insomnia not due to a substance or known physiological condition: Secondary | ICD-10-CM | POA: Diagnosis not present

## 2016-03-26 DIAGNOSIS — Z79891 Long term (current) use of opiate analgesic: Secondary | ICD-10-CM | POA: Diagnosis not present

## 2016-03-26 DIAGNOSIS — M545 Low back pain: Secondary | ICD-10-CM | POA: Diagnosis not present

## 2016-03-29 ENCOUNTER — Encounter (INDEPENDENT_AMBULATORY_CARE_PROVIDER_SITE_OTHER): Payer: Self-pay | Admitting: Internal Medicine

## 2016-03-29 ENCOUNTER — Ambulatory Visit (INDEPENDENT_AMBULATORY_CARE_PROVIDER_SITE_OTHER): Payer: Medicare Other | Admitting: Internal Medicine

## 2016-03-29 VITALS — BP 130/80 | HR 72 | Temp 97.7°F | Ht 62.0 in | Wt 158.7 lb

## 2016-03-29 DIAGNOSIS — B182 Chronic viral hepatitis C: Secondary | ICD-10-CM | POA: Diagnosis not present

## 2016-03-29 NOTE — Patient Instructions (Signed)
Hepatic and Hep C quaint. OV in 6 months.

## 2016-03-29 NOTE — Progress Notes (Signed)
Subjective:    Patient ID: Teresa [name], female    DOB: [birth year], 61 y.o.   MRN: [id number]  HPI Presents today with c/o dysphagia. She says she has a lot of saliva in her mouth. Sometimes she carries a cup around to spit in off and on.  She says sometimes she may have a greenish discoloration.  No foods are lodging in her esophagus. Her appetite is good. No weight loss.  She usually has a BM every other day.  No dysphagia to solids. In March of 2017 she underwent a DG Esophagram for dysphagia.IMPRESSION: 1. No evidence of recurrent stricture. 2. Nonspecific esophageal motility disorder with occasional tertiary contractions. 3. Small hiatal hernia. Hx of Hepatitis C and successfully treated in 2017.  Hx of diverticulitis Last colonoscopy was in 2015 by Dr. Henrene Pastor. Recently lost her grandson in November to a drug over dose.  12/12/2015 [place] RUQ;  IMPRESSION: Persistent increased hepatic echotexture consistent with fatty infiltration or early cirrhosis. There is no focal mass or ductal dilation. No ascites is observed in the right upper quadrant. 07/18/2013 EGD/ED: Dr. Henrene Pastor, dysphagia: Normal EGD, Status post [facility] dilation for dysphagia.[facility] dilator passed into the esophagus without resistance or heme).    Hx of EGD/ED in the past by Dr Henrene Pastor at [facility] in 2015. She has had multiple EGD/ED in the past.  Review of Systems Past Medical History:  Diagnosis Date  . Anxiety state, unspecified   . Asthma    prn inhaler  . Chronic back pain greater than 3 months duration   . Colon polyp   . Degeneration of intervertebral disc, site unspecified   . Depressive disorder, not elsewhere classified   . Diverticulitis   . Diverticulosis 07-08-2005   colonoscopy  . Esophageal stricture 2014  . Full dentures   . GERD (gastroesophageal reflux disease) 07-08-2005   EGD  . Hiatal hernia 07-08-2005   EGD  . Insomnia   . Irritable bowel syndrome   . Migraine   . Muscle  spasm   . Osteoarthrosis, unspecified whether generalized or localized, lower leg   . Papilloma of breast 09/2011   left  . Seasonal allergies   . Seizures ([place]) 1990s   x 1, unknown cause; no seizures since  . Vocal cord polyp    history of    Past Surgical History:  Procedure Laterality Date  . ABDOMINAL HYSTERECTOMY     partial  . BILATERAL SALPINGOOPHORECTOMY    . BLADDER SURGERY     bladder tack  . BREAST BIOPSY  11/03/2011   Procedure: BREAST BIOPSY WITH NEEDLE LOCALIZATION;  Surgeon: Harl Bowie, MD;  Location: [place];  Service: General;  Laterality: Left;  needle localized left breast biopsy  . CHOLECYSTECTOMY    . COLONOSCOPY  07/08/2005   562.10  . CYSTOSCOPY  08/24/2011   Procedure: CYSTOSCOPY;  Surgeon: Reece Packer, MD;  Location: [facility];  Service: Urology;  Laterality: N/A;  Cystoscopy,  Rectocele Repair and Vault Prolapse Repair  . ESOPHAGOGASTRODUODENOSCOPY  10/03/2012   multiple   . OTHER SURGICAL HISTORY     exc. vocal cord polyp  . RECTAL TUMOR BY PROCTOTOMY EXCISION    . RECTOCELE REPAIR  08/24/2011   Procedure: POSTERIOR REPAIR (RECTOCELE);  Surgeon: Reece Packer, MD;  Location: [facility];  Service: Urology;  Laterality: N/A;  . rectocelle repair    . TUMOR REMOVAL     benign  . VAGINAL PROLAPSE REPAIR  08/24/2011   Procedure: VAGINAL VAULT SUSPENSION;  Surgeon: Reece Packer, MD;  Location: [facility];  Service: Urology;  Laterality: N/A;    Allergies  Allergen Reactions  . Lyrica [Pregabalin] Shortness Of Breath  . Trazodone And Nefazodone Shortness Of Breath  . Amitriptyline   . Arthrotec [Diclofenac-Misoprostol] Other (See Comments)    Upset stomach  . Keflex [Cephalexin] Other (See Comments)    Unknown reaction  . Naproxen Other (See Comments)    Irritates stomach  . Prozac [Fluoxetine Hcl] Other (See Comments)    "makes me feel bad"  . Ambien [Zolpidem Tartrate] Other (See Comments)    COULDN'T FUNCTION,  STAYED SLEEPY    Current Outpatient Prescriptions on File Prior to Visit  Medication Sig Dispense Refill  . budesonide-formoterol (SYMBICORT) 160-4.5 MCG/ACT inhaler Inhale 2 puffs into the lungs 2 (two) times daily. 1 Inhaler 3  . citalopram (CELEXA) 40 MG tablet Take 1 tablet (40 mg total) by mouth daily. 90 tablet 1  . cyclobenzaprine (FLEXERIL) 10 MG tablet 2 (two) times daily after a meal.     . DEXILANT 60 MG capsule TAKE ONE (1) CAPSULE EACH DAY 90 capsule 1  . docusate sodium (COLACE) 100 MG capsule Take 100-200 mg by mouth daily.     Marland Kitchen lisinopril (PRINIVIL,ZESTRIL) 10 MG tablet TAKE ONE (1) TABLET EACH DAY 30 tablet 5  . MAGNESIUM PO Take 1 tablet by mouth daily. Reported on 07/10/2015    . montelukast (SINGULAIR) 10 MG tablet TAKE ONE (1) TABLET EACH DAY 90 tablet 0  . oxymorphone (OPANA) 10 MG tablet Take 10 mg by mouth 2 (two) times daily before a meal.    . polyethylene glycol powder (GLYCOLAX/MIRALAX) powder MIX 17 GRAMS IN 8 OZ OF WATER DAILY (Patient taking differently: MIX 17 GRAMS IN 8 OZ OF WATER DAILY as needed) 527 g 0  . PROAIR HFA 108 (90 Base) MCG/ACT inhaler INHALE 2 PUFFS EVERY 4 HOURS AS NEEDED 25.5 g 4  . Probiotic Product (PROBIOTIC DAILY PO) Take by mouth.    . promethazine (PHENERGAN) 25 MG tablet Take 25 mg by mouth as needed.      No current facility-administered medications on file prior to visit.        Objective:   Physical Exam Blood pressure 130/80, pulse 72, temperature 97.7 F (36.5 C), height 5\' 2"  (1.575 m), weight 158 lb 11.2 oz (72 kg).  Alert and oriented. Skin warm and dry. Oral mucosa is moist.   . Sclera anicteric, conjunctivae is pink. Thyroid not enlarged. No cervical lymphadenopathy. Lungs clear. Heart regular rate and rhythm.  Abdomen is soft. Bowel sounds are positive. No hepatomegaly. No abdominal masses felt. No tenderness.  No edema to lower extremities.  .       Assessment & Plan:  GERD/ dysphagia to liquids at times.: Drink  lots of fluids.  Continue the Dexilant.  Try to eat 3 meals a day.  OV in 6 months.  Hepatitis C: Hep C quaint, Hepatic panel.

## 2016-03-30 LAB — HEPATIC FUNCTION PANEL
ALBUMIN: 4.1 g/dL (ref 3.6–5.1)
ALT: 43 U/L — AB (ref 6–29)
AST: 40 U/L — AB (ref 10–35)
Alkaline Phosphatase: 84 U/L (ref 33–130)
BILIRUBIN TOTAL: 1 mg/dL (ref 0.2–1.2)
Bilirubin, Direct: 0.3 mg/dL — ABNORMAL HIGH (ref <=0.2)
Indirect Bilirubin: 0.7 mg/dL (ref 0.2–1.2)
Total Protein: 7.2 g/dL (ref 6.1–8.1)

## 2016-03-31 LAB — HEPATITIS C RNA QUANTITATIVE
HCV QUANT: NOT DETECTED [IU]/mL
HCV Quantitative Log: <1.18 Log IU/mL

## 2016-04-06 ENCOUNTER — Other Ambulatory Visit (INDEPENDENT_AMBULATORY_CARE_PROVIDER_SITE_OTHER): Payer: Self-pay | Admitting: *Deleted

## 2016-04-06 DIAGNOSIS — B182 Chronic viral hepatitis C: Secondary | ICD-10-CM

## 2016-05-06 ENCOUNTER — Other Ambulatory Visit: Payer: Self-pay | Admitting: Family

## 2016-05-06 ENCOUNTER — Other Ambulatory Visit: Payer: Self-pay | Admitting: Family Medicine

## 2016-05-10 ENCOUNTER — Other Ambulatory Visit: Payer: Self-pay | Admitting: Family Medicine

## 2016-05-17 ENCOUNTER — Ambulatory Visit (INDEPENDENT_AMBULATORY_CARE_PROVIDER_SITE_OTHER): Payer: Medicare Other | Admitting: Family Medicine

## 2016-05-17 ENCOUNTER — Encounter: Payer: Self-pay | Admitting: Family Medicine

## 2016-05-17 VITALS — BP 137/90 | HR 105 | Temp 97.5°F | Ht 62.0 in | Wt 160.8 lb

## 2016-05-17 DIAGNOSIS — F411 Generalized anxiety disorder: Secondary | ICD-10-CM

## 2016-05-17 DIAGNOSIS — F339 Major depressive disorder, recurrent, unspecified: Secondary | ICD-10-CM

## 2016-05-17 MED ORDER — DULOXETINE HCL 20 MG PO CPEP: 20.0000 mg | ORAL_CAPSULE | Freq: Every day | ORAL | 1 refills | Status: DC

## 2016-05-17 NOTE — Patient Instructions (Signed)
Great to see you!  It looks like celexa is not as helpful as we hoped.   Today take 1/2 of celexa, stop after 2 weeks.  Today start Cymbalta 1 pill once daily ( you will take 1/2 Celexa and 1 Cymbalta together for 2 weeks, then continue only Cymbalta)  Come back in 1 month to see how it is going.   PLease call one of the psychiatrists below for an appointment  Your provider wants you to schedule an appointment with a Psychologist/Psychiatrist. The following list of offices requires the patient to call and make their own appointment, as there is information they need that only you can provide. Please feel free to choose form the following providers:  [place] in [place]  [place]  [phone] [place], [place]  (Scheduled through [place]) Must call and do an interview for appointment. Sees Children / Accepts Medicaid  [facility] [place]  [address] [place]    [place], [place]  [phone] [place], [place] for Autism but does not treat it Sees Children / Accepts Medicaid  [facility]    [phone] [address], [place], [place] Medication management, substance abuse, bipolar, grief, family, marriage, OCD, anxiety, PTSD Sees children / Accepts Medicaid  [facility]    [phone] [address], [place], [place] children / Accepts [facility]  [facility]  [phone] [address] [place], [place]   Dr Lorenza Evangelist     [phone] [address] [place], [place]  Sees ADD & ADHD for treatment Accepts Medicaid  [facility]  [phone] [address] Arlean Hopping, [place] for Autism Accepts [facility]  [facility]  [phone] [place], [place]   Does Adult ADD evaluations Does not accept Medicaid  [facility]   [phone] [place], [place] children as young as 47 years old Accepts [facility]     [phone]    [place], [place] [address] Sees children Accepts Medicaid

## 2016-05-17 NOTE — Progress Notes (Signed)
   HPI  Patient presents today here for depression and anxiety.  Patient states that Valium is helping her anxiety, this is prescribed by her pain doctor. Dr. Merlene Laughter  She has severe depression. She states that she has frequent thoughts of being better off dead. She states that she would never hurt herself, he contracts for safety if she was planning to hurt herself she would call 911 or left her daughter know.  Her son recently died from an accidental medication overdose with psychiatry medications.  She has had several intolerances to medications. She was previously seemingly improving on Celexa, however now she seems to have regressed.  PMH: Smoking status noted ROS: Per HPI  Objective: BP 137/90   Pulse (!) 105   Temp 97.5 F (36.4 C) (Oral)   Ht 5\' 2"  (1.575 m)   Wt 160 lb 12.8 oz (72.9 kg)   BMI 29.41 kg/m  Gen: NAD, alert, cooperative with exam HEENT: NCAT CV: RRR, good S1/S2, no murmur Resp: CTABL, no wheezes, non-labored Ext: No edema, warm Neuro: Alert and oriented Depression screen [facility] 2/9 05/17/2016 03/04/2016 01/27/2016 12/26/2015 12/23/2015  Decreased Interest 2 1 1 2  0  Down, Depressed, Hopeless 2 0 1 3 1   PHQ - 2 Score 4 1 2 5 1   Altered sleeping 3 - 2 3 -  Tired, decreased energy 3 - 2 3 -  Change in appetite 3 - 1 1 -  Feeling bad or failure about yourself  2 - 1 2 -  Trouble concentrating 3 - 1 1 -  Moving slowly or fidgety/restless 2 - 0 3 -  Suicidal thoughts 2 - 0 0 -  PHQ-9 Score 22 - 9 18 -  Difficult doing work/chores Somewhat difficult - Not difficult at all Somewhat difficult -   Psych:  Psychomotor slowing Denies suicidal thoughts, however endorses frequent thoughts of being better off dead. Contracts for safety   Assessment and plan:  # depression, anxiety Mixed mood disorder Patient taking Valium prescribed by her neurologist. Changing Celexa to Cymbalta, decreased to 20 mg 2 weeks and add 20 mg of Cymbalta. [facility], I have given her several options, her son was going to them when he overdosed and died Her daughter is doing well there.  RTC in 3-4 weeks Psych referral written     Orders Placed This Encounter  Procedures  . Ambulatory referral to Psychiatry    Referral Priority:   Routine    Referral Type:   Psychiatric    Referral Reason:   Specialty Services Required    Requested Specialty:   Psychiatry    Number of Visits Requested:   1    Meds ordered this encounter  Medications  . DULoxetine (CYMBALTA) 20 MG capsule    Sig: Take 1 capsule (20 mg total) by mouth daily.    Dispense:  30 capsule    Refill:  [place], MD [facility] 05/17/2016, 4:12 PM

## 2016-05-26 ENCOUNTER — Telehealth (HOSPITAL_COMMUNITY): Payer: Self-pay | Admitting: *Deleted

## 2016-05-26 NOTE — Telephone Encounter (Signed)
left voice message regarding an appointment. 

## 2016-05-31 DIAGNOSIS — R252 Cramp and spasm: Secondary | ICD-10-CM | POA: Diagnosis not present

## 2016-05-31 DIAGNOSIS — M5416 Radiculopathy, lumbar region: Secondary | ICD-10-CM | POA: Diagnosis not present

## 2016-05-31 DIAGNOSIS — F5109 Other insomnia not due to a substance or known physiological condition: Secondary | ICD-10-CM | POA: Diagnosis not present

## 2016-05-31 DIAGNOSIS — Z79891 Long term (current) use of opiate analgesic: Secondary | ICD-10-CM | POA: Diagnosis not present

## 2016-05-31 DIAGNOSIS — M545 Low back pain: Secondary | ICD-10-CM | POA: Diagnosis not present

## 2016-06-03 ENCOUNTER — Ambulatory Visit: Payer: Medicare Other | Admitting: Family

## 2016-06-06 ENCOUNTER — Other Ambulatory Visit: Payer: Self-pay | Admitting: Family

## 2016-06-06 DIAGNOSIS — I10 Essential (primary) hypertension: Secondary | ICD-10-CM

## 2016-06-17 ENCOUNTER — Ambulatory Visit (INDEPENDENT_AMBULATORY_CARE_PROVIDER_SITE_OTHER): Payer: Medicare Other | Admitting: Family Medicine

## 2016-06-17 ENCOUNTER — Encounter: Payer: Self-pay | Admitting: Family Medicine

## 2016-06-17 VITALS — BP 130/85 | HR 109 | Temp 96.5°F | Ht 62.0 in | Wt 163.0 lb

## 2016-06-17 DIAGNOSIS — F32A Depression, unspecified: Secondary | ICD-10-CM

## 2016-06-17 DIAGNOSIS — Z1231 Encounter for screening mammogram for malignant neoplasm of breast: Secondary | ICD-10-CM | POA: Diagnosis not present

## 2016-06-17 DIAGNOSIS — Z1239 Encounter for other screening for malignant neoplasm of breast: Secondary | ICD-10-CM

## 2016-06-17 DIAGNOSIS — F329 Major depressive disorder, single episode, unspecified: Secondary | ICD-10-CM | POA: Diagnosis not present

## 2016-06-17 NOTE — Progress Notes (Signed)
   HPI  Patient presents today follow-up for depression as well as discuss breast cancer screening.  Depression Patient has not been to see psychiatry. She was started by Dr. Florene Glen on Cymbalta 30 mg twice daily, however she did not make the change. She states that she take 20 mg daily and has run out of that. She's hesitant to increase to 30 mg twice daily. Denies suicidal thoughts. Has not seen much improvement in her mood.  Breast cancer screening Patient is willing to get a mammogram  PMH: Smoking status noted ROS: Per HPI  Objective: BP 130/85   Pulse (!) 109   Temp (!) 96.5 F (35.8 C) (Oral)   Ht 5\' 2"  (1.575 m)   Wt 163 lb (73.9 kg)   BMI 29.81 kg/m  Gen: NAD, alert, cooperative with exam HEENT: NCAT CV: RRR, good S1/S2, no murmur Resp: CTABL, no wheezes, non-labored Ext: No edema, warm Neuro: Alert and oriented, No gross deficits  Assessment and plan:  # depression Stable, no suicidal thoughts Titrate Cymbalta to 30 mg once daily 1 week then 30 mg twice daily. Be Sure to follow-up with Dr. Florene Glen, neurology, as discussed  # Breast cancer screening Mammogram ordered     Orders Placed This Encounter  Procedures  . MM Digital Screening    Standing Status:   Future    Standing Expiration Date:   08/17/2017    Scheduling Instructions:     [facility] bus    Order Specific Question:   Reason for Exam (SYMPTOM  OR DIAGNOSIS REQUIRED)    Answer:   screening    Order Specific Question:   Is the patient pregnant?    Answer:   No    Order Specific Question:   Preferred imaging location?    Answer:   External    Laroy Apple, MD [facility] 06/17/2016, 4:50 PM

## 2016-06-17 NOTE — Patient Instructions (Addendum)
Great to see you!  Take 30 mg once daily for 1 week then increase to 30 mg twice daily. Be sure to follow up with Dr. Florene Glen.   Schedule your mammogram on the way out  Come back for follow up depression in 2 months

## 2016-06-25 ENCOUNTER — Encounter (INDEPENDENT_AMBULATORY_CARE_PROVIDER_SITE_OTHER): Payer: Self-pay | Admitting: *Deleted

## 2016-06-25 ENCOUNTER — Other Ambulatory Visit (INDEPENDENT_AMBULATORY_CARE_PROVIDER_SITE_OTHER): Payer: Self-pay | Admitting: *Deleted

## 2016-06-25 DIAGNOSIS — H1045 Other chronic allergic conjunctivitis: Secondary | ICD-10-CM | POA: Diagnosis not present

## 2016-06-25 DIAGNOSIS — H5202 Hypermetropia, left eye: Secondary | ICD-10-CM | POA: Diagnosis not present

## 2016-06-25 DIAGNOSIS — H52223 Regular astigmatism, bilateral: Secondary | ICD-10-CM | POA: Diagnosis not present

## 2016-06-25 DIAGNOSIS — B182 Chronic viral hepatitis C: Secondary | ICD-10-CM

## 2016-06-25 DIAGNOSIS — H524 Presbyopia: Secondary | ICD-10-CM | POA: Diagnosis not present

## 2016-07-05 ENCOUNTER — Encounter: Payer: Self-pay | Admitting: Internal Medicine

## 2016-07-07 ENCOUNTER — Emergency Department (HOSPITAL_COMMUNITY): Payer: Medicare Other

## 2016-07-07 ENCOUNTER — Emergency Department (HOSPITAL_COMMUNITY)
Admission: EM | Admit: 2016-07-07 | Discharge: 2016-07-07 | Disposition: A | Discharge status: Home / Self Care | Payer: Medicare Other | Attending: Emergency Medicine | Admitting: Emergency Medicine

## 2016-07-07 ENCOUNTER — Encounter (HOSPITAL_COMMUNITY): Payer: Self-pay | Admitting: Emergency Medicine

## 2016-07-07 DIAGNOSIS — M79605 Pain in left leg: Secondary | ICD-10-CM | POA: Diagnosis not present

## 2016-07-07 DIAGNOSIS — J45909 Unspecified asthma, uncomplicated: Secondary | ICD-10-CM | POA: Insufficient documentation

## 2016-07-07 DIAGNOSIS — M79661 Pain in right lower leg: Secondary | ICD-10-CM

## 2016-07-07 DIAGNOSIS — M7989 Other specified soft tissue disorders: Secondary | ICD-10-CM

## 2016-07-07 DIAGNOSIS — R0789 Other chest pain: Secondary | ICD-10-CM | POA: Insufficient documentation

## 2016-07-07 DIAGNOSIS — R0602 Shortness of breath: Secondary | ICD-10-CM | POA: Diagnosis not present

## 2016-07-07 DIAGNOSIS — Z79899 Other long term (current) drug therapy: Secondary | ICD-10-CM | POA: Insufficient documentation

## 2016-07-07 DIAGNOSIS — Z87891 Personal history of nicotine dependence: Secondary | ICD-10-CM | POA: Insufficient documentation

## 2016-07-07 DIAGNOSIS — R609 Edema, unspecified: Secondary | ICD-10-CM

## 2016-07-07 DIAGNOSIS — M79604 Pain in right leg: Secondary | ICD-10-CM | POA: Insufficient documentation

## 2016-07-07 DIAGNOSIS — M79662 Pain in left lower leg: Secondary | ICD-10-CM | POA: Diagnosis not present

## 2016-07-07 LAB — COMPREHENSIVE METABOLIC PANEL
ALT: 28 U/L (ref 14–54)
AST: 34 U/L (ref 15–41)
Albumin: 3.6 g/dL (ref 3.5–5.0)
Alkaline Phosphatase: 75 U/L (ref 38–126)
Anion gap: 5 (ref 5–15)
BILIRUBIN TOTAL: 0.6 mg/dL (ref 0.3–1.2)
BUN: 7 mg/dL (ref 6–20)
CO2: 31 mmol/L (ref 22–32)
Calcium: 8.4 mg/dL — ABNORMAL LOW (ref 8.9–10.3)
Chloride: 102 mmol/L (ref 101–111)
Creatinine, Ser: 0.69 mg/dL (ref 0.44–1.00)
Glucose, Bld: 123 mg/dL — ABNORMAL HIGH (ref 65–99)
Potassium: 4 mmol/L (ref 3.5–5.1)
Sodium: 138 mmol/L (ref 135–145)
TOTAL PROTEIN: 6.8 g/dL (ref 6.5–8.1)

## 2016-07-07 LAB — CBC WITH DIFFERENTIAL/PLATELET
BASOS ABS: 0 10*3/uL (ref 0.0–0.1)
Basophils Relative: 0 %
Eosinophils Absolute: 0.2 10*3/uL (ref 0.0–0.7)
Eosinophils Relative: 4 %
HEMATOCRIT: 37.5 % (ref 36.0–46.0)
Hemoglobin: 12.2 g/dL (ref 12.0–15.0)
LYMPHS PCT: 41 %
Lymphs Abs: 2 10*3/uL (ref 0.7–4.0)
MCH: 27 pg (ref 26.0–34.0)
MCHC: 32.5 g/dL (ref 30.0–36.0)
MCV: 83 fL (ref 78.0–100.0)
Monocytes Absolute: 0.5 10*3/uL (ref 0.1–1.0)
Monocytes Relative: 9 %
NEUTROS ABS: 2.2 10*3/uL (ref 1.7–7.7)
NEUTROS PCT: 46 %
PLATELETS: 141 10*3/uL — AB (ref 150–400)
RBC: 4.52 MIL/uL (ref 3.87–5.11)
RDW: 14.8 % (ref 11.5–15.5)
WBC: 4.8 10*3/uL (ref 4.0–10.5)

## 2016-07-07 LAB — PROTIME-INR
INR: 0.98
Prothrombin Time: 13 seconds (ref 11.4–15.2)

## 2016-07-07 LAB — D-DIMER, QUANTITATIVE: D-Dimer, Quant: <0.27 ug/mL-FEU (ref 0.00–0.50)

## 2016-07-07 LAB — TROPONIN I: Troponin I: <0.03 ng/mL (ref <0.03)

## 2016-07-07 LAB — APTT: APTT: 31 s (ref 24–36)

## 2016-07-07 MED ORDER — METHOCARBAMOL 500 MG PO TABS: 500.0000 mg | ORAL_TABLET | Freq: Three times a day (TID) | ORAL | 0 refills | Status: DC | PRN

## 2016-07-07 MED ORDER — IOPAMIDOL (ISOVUE-370) INJECTION 76%
100.0000 mL | Freq: Once | INTRAVENOUS | Status: AC | PRN
  Administered 2016-07-07: 100 mL via INTRAVENOUS

## 2016-07-07 MED ORDER — ENOXAPARIN SODIUM 80 MG/0.8ML ~~LOC~~ SOLN
1.0000 mg/kg | Freq: Once | SUBCUTANEOUS | Status: AC
  Administered 2016-07-07: 70 mg via SUBCUTANEOUS
  Filled 2016-07-07: qty 0.8

## 2016-07-07 NOTE — ED Triage Notes (Signed)
Pt states she has been having leg swelling with pain for the last several days and chest tightness that has been coming and going

## 2016-07-07 NOTE — ED Provider Notes (Signed)
[facility] DEPT Provider Note   CSN: [id number] Arrival date & time: 07/07/16  0233  Time seen 03:55 AM   History   Chief Complaint Chief Complaint  Patient presents with  . Leg Swelling    HPI Teresa [name] is a 61 y.o. female.  HPI   patient states she has chronic bilateral leg pain that is followed by Dr Merlene Laughter, however the last 3 days she's had different pain. She is unable to describe the pain. She denies any injury or change in her activity. She denies being bedridden or recent travel. She also feels like her legs up and swelling over the past few days and her legs feel tight. She states she had chest tightness on May 7 and has been using her inhalers with brief improvement. She states she feels short of breath. She has a history of asthma and COPD. She also reports a cough with yellow-green mucus. She denies fever but has had chills. She states her chest feels tight. She denies sore throat, rhinorrhea or sneezing.  Patient states her mother had DVT and PEs, her daughter has had DVT. Patient has never had DVT or PE in the past.  PCP Sharion Balloon, FNP   Past Medical History:  Diagnosis Date  . Anxiety state, unspecified   . Asthma    prn inhaler  . Chronic back pain greater than 3 months duration   . Colon polyp   . Degeneration of intervertebral disc, site unspecified   . Depressive disorder, not elsewhere classified   . Diverticulitis   . Diverticulosis 07-08-2005   colonoscopy  . Esophageal stricture 2014  . Full dentures   . GERD (gastroesophageal reflux disease) 07-08-2005   EGD  . Hiatal hernia 07-08-2005   EGD  . Insomnia   . Irritable bowel syndrome   . Migraine   . Muscle spasm   . Osteoarthrosis, unspecified whether generalized or localized, lower leg   . Papilloma of breast 09/2011   left  . Seasonal allergies   . Seizures ([place]) 1990s   x 1, unknown cause; no seizures since  . Vocal cord polyp    history of    Patient Active Problem List     Diagnosis Date Noted  . Hepatitis C 05/19/2015  . Dysphagia 05/01/2015  . Diverticulitis of colon 11/28/2014  . Asthma, chronic 03/18/2014  . Hyperglycemia 12/06/2012  . Paresthesias 12/05/2012  . GERD (gastroesophageal reflux disease) 08/17/2012  . Eczema 08/17/2012  . Constipation 08/03/2012  . Tachycardia 07/25/2012  . Papilloma of breast 09/01/2011  . GAD (generalized anxiety disorder) 05/08/2007  . DRUG ABUSE, IN REMISSION 05/08/2007  . ALCOHOL ABUSE, IN REMISSION 05/08/2007  . Depression 05/08/2007  . Irritable bowel syndrome 05/08/2007  . Osteoarthrosis involving lower leg 05/08/2007  . OSTEOPENIA 05/08/2007  . VOCAL CORD POLYP, HX OF 05/08/2007  . LIVER HEMANGIOMA 12/02/2006  . OVARIAN CYST, RIGHT 12/02/2006  . ESOPHAGEAL STRICTURE 07/08/2005  . HIATAL HERNIA 07/08/2005  . Diverticulosis of large intestine 07/08/2005    Past Surgical History:  Procedure Laterality Date  . ABDOMINAL HYSTERECTOMY     partial  . BILATERAL SALPINGOOPHORECTOMY    . BLADDER SURGERY     bladder tack  . BREAST BIOPSY  11/03/2011   Procedure: BREAST BIOPSY WITH NEEDLE LOCALIZATION;  Surgeon: Harl Bowie, MD;  Location: [facility];  Service: General;  Laterality: Left;  needle localized left breast biopsy  . CHOLECYSTECTOMY    . COLONOSCOPY  07/08/2005  562.10  . CYSTOSCOPY  08/24/2011   Procedure: CYSTOSCOPY;  Surgeon: Reece Packer, MD;  Location: [facility];  Service: Urology;  Laterality: N/A;  Cystoscopy,  Rectocele Repair and Vault Prolapse Repair  . ESOPHAGOGASTRODUODENOSCOPY  10/03/2012   multiple   . OTHER SURGICAL HISTORY     exc. vocal cord polyp  . RECTAL TUMOR BY PROCTOTOMY EXCISION    . RECTOCELE REPAIR  08/24/2011   Procedure: POSTERIOR REPAIR (RECTOCELE);  Surgeon: Reece Packer, MD;  Location: [facility];  Service: Urology;  Laterality: N/A;  . rectocelle repair    . TUMOR REMOVAL     benign  . VAGINAL PROLAPSE REPAIR  08/24/2011   Procedure:  VAGINAL VAULT SUSPENSION;  Surgeon: Reece Packer, MD;  Location: [facility];  Service: Urology;  Laterality: N/A;    OB History    No data available       Home Medications    Prior to Admission medications   Medication Sig Start Date End Date Taking? Authorizing Provider  budesonide-formoterol (SYMBICORT) 160-4.5 MCG/ACT inhaler Inhale 2 puffs into the lungs 2 (two) times daily. 12/11/15   Dettinger, Fransisca Kaufmann, MD  cyclobenzaprine (FLEXERIL) 10 MG tablet 2 (two) times daily after a meal.  12/03/15   [provider]  DEXILANT 60 MG capsule TAKE ONE (1) CAPSULE EACH DAY 03/10/16   Hawks, Christy A, FNP  docusate sodium (COLACE) 100 MG capsule Take 100-200 mg by mouth daily.     [provider]  DULoxetine (CYMBALTA) 20 MG capsule Take 1 capsule (20 mg total) by mouth daily. 05/17/16   Timmothy Euler, MD  lisinopril (PRINIVIL,ZESTRIL) 10 MG tablet TAKE ONE (1) TABLET EACH DAY 06/07/16   Hawks, Christy A, FNP  MAGNESIUM PO Take 1 tablet by mouth daily. Reported on 07/10/2015    [provider]  montelukast (SINGULAIR) 10 MG tablet TAKE ONE (1) TABLET EACH DAY 05/07/16   Timmothy Euler, MD  oxymorphone (OPANA) 10 MG tablet Take 10 mg by mouth 2 (two) times daily before a meal.    [provider]  polyethylene glycol powder (GLYCOLAX/MIRALAX) powder MIX 17 GRAMS IN 8 OZ OF WATER DAILY Patient taking differently: MIX 17 GRAMS IN 8 OZ OF WATER DAILY as needed 03/10/16   Sharion Balloon, FNP  PROAIR HFA 108 [phone] Base) MCG/ACT inhaler INHALE 2 PUFFS EVERY 4 HOURS AS NEEDED 12/03/15   Evelina Dun A, FNP  Probiotic Product (PROBIOTIC DAILY PO) Take by mouth.    [provider]  promethazine (PHENERGAN) 25 MG tablet Take 25 mg by mouth as needed.  05/22/15   [provider]    Family History Family History  Problem Relation Age of Onset  . Prostate cancer Father   . Sleep apnea Father   . Stomach cancer Maternal Uncle   . Diabetes Maternal  Grandmother   . Stroke Maternal Grandmother   . Heart disease Paternal Grandmother   . Irritable bowel syndrome    . Stroke Maternal Grandfather     Social History Social History  Substance Use Topics  . Smoking status: Former Smoker    Quit date: 08/20/1991  . Smokeless tobacco: Never Used  . Alcohol use No     Comment: quit 23 yrs ago  lives at home   Allergies   Lyrica [pregabalin]; Trazodone and nefazodone; Amitriptyline; Arthrotec [diclofenac-misoprostol]; Keflex [cephalexin]; Naproxen; Prozac [fluoxetine hcl]; and Ambien [zolpidem tartrate]   Review of Systems Review of Systems  All other systems  reviewed and are negative.    Physical Exam Updated Vital Signs BP 106/64   Pulse 85   Temp 97.5 F (36.4 C) (Oral)   Resp 15   Ht 5\' 2"  (1.575 m)   Wt 159 lb (72.1 kg)   SpO2 96%   BMI 29.08 kg/m   Vital signs normal    Physical Exam  Constitutional: She is oriented to person, place, and time. She appears well-developed and well-nourished.  Non-toxic appearance. She does not appear ill. No distress.  HENT:  Head: Normocephalic and atraumatic.  Right Ear: External ear normal.  Left Ear: External ear normal.  Nose: Nose normal. No mucosal edema or rhinorrhea.  Mouth/Throat: Oropharynx is clear and moist and mucous membranes are normal. No dental abscesses or uvula swelling.  Eyes: Conjunctivae and EOM are normal. Pupils are equal, round, and reactive to light.  Neck: Normal range of motion and full passive range of motion without pain. Neck supple.  Cardiovascular: Normal rate, regular rhythm and normal heart sounds.  Exam reveals no gallop and no friction rub.   No murmur heard. Pulmonary/Chest: Effort normal and breath sounds normal. No respiratory distress. She has no wheezes. She has no rhonchi. She has no rales. She exhibits no tenderness and no crepitus.  Abdominal: Soft. Normal appearance and bowel sounds are normal. She exhibits no distension. There is no  tenderness. There is no rebound and no guarding.  Musculoskeletal: Normal range of motion. She exhibits edema. She exhibits no tenderness.  Moves all extremities well.   Patient has tenderness in her left medial thigh and in her left popliteal area and in her left calf. She has trace pitting edema of her anterior shin.  Patient does not have any localized tenderness except some mild tenderness in her right calf. She has trace pitting edema of her anterior shin. She has good distal pulses and capillary refill.  Neurological: She is alert and oriented to person, place, and time. She has normal strength. No cranial nerve deficit.  Skin: Skin is warm, dry and intact. No rash noted. No erythema. No pallor.  Psychiatric: She has a normal mood and affect. Her speech is normal and behavior is normal. Her mood appears not anxious.  Nursing note and vitals reviewed.    ED Treatments / Results  Labs (all labs ordered are listed, but only abnormal results are displayed) Results for orders placed or performed during the hospital encounter of 07/07/16  Comprehensive metabolic panel  Result Value Ref Range   Sodium 138 135 - 145 mmol/L   Potassium 4.0 3.5 - 5.1 mmol/L   Chloride 102 101 - 111 mmol/L   CO2 31 22 - 32 mmol/L   Glucose, Bld 123 (H) 65 - 99 mg/dL   BUN 7 6 - 20 mg/dL   Creatinine, Ser 0.69 0.44 - 1.00 mg/dL   Calcium 8.4 (L) 8.9 - 10.3 mg/dL   Total Protein 6.8 6.5 - 8.1 g/dL   Albumin 3.6 3.5 - 5.0 g/dL   AST 34 15 - 41 U/L   ALT 28 14 - 54 U/L   Alkaline Phosphatase 75 38 - 126 U/L   Total Bilirubin 0.6 0.3 - 1.2 mg/dL   GFR calc non Af Amer >60 >60 mL/min   GFR calc Af Amer >60 >60 mL/min   Anion gap 5 5 - 15  CBC with Differential  Result Value Ref Range   WBC 4.8 4.0 - 10.5 K/uL   RBC 4.52 3.87 - 5.11 MIL/uL  Hemoglobin 12.2 12.0 - 15.0 g/dL   HCT 37.5 36.0 - 46.0 %   MCV 83.0 78.0 - 100.0 fL   MCH 27.0 26.0 - 34.0 pg   MCHC 32.5 30.0 - 36.0 g/dL   RDW 14.8 11.5 -  15.5 %   Platelets 141 (L) 150 - 400 K/uL   Neutrophils Relative % 46 %   Neutro Abs 2.2 1.7 - 7.7 K/uL   Lymphocytes Relative 41 %   Lymphs Abs 2.0 0.7 - 4.0 K/uL   Monocytes Relative 9 %   Monocytes Absolute 0.5 0.1 - 1.0 K/uL   Eosinophils Relative 4 %   Eosinophils Absolute 0.2 0.0 - 0.7 K/uL   Basophils Relative 0 %   Basophils Absolute 0.0 0.0 - 0.1 K/uL  Protime-INR  Result Value Ref Range   Prothrombin Time 13.0 11.4 - 15.2 seconds   INR 0.98   APTT  Result Value Ref Range   aPTT 31 24 - 36 seconds  D-dimer, quantitative  Result Value Ref Range   D-Dimer, Quant <0.27 0.00 - 0.50 ug/mL-FEU  Troponin I  Result Value Ref Range   Troponin I <0.03 <0.03 ng/mL   Laboratory interpretation all normal     EKG  EKG Interpretation  Date/Time:  Wednesday Jul 07 2016 02:50:32 EDT Ventricular Rate:  85 PR Interval:    QRS Duration: 90 QT Interval:  393 QTC Calculation: 468 R Axis:   23 Text Interpretation:  Sinus rhythm Low voltage, precordial leads Baseline wander No significant change since last tracing 07 Aug 2014 Confirmed by Lakena Sparlin  MD-I, Matheus Spiker ([id number]) on 07/07/2016 4:59:31 AM       Radiology Ct Angio Chest Pe W/cm &/or Wo Cm  Result Date: 07/07/2016 CLINICAL DATA:  Shortness of breath, chest tightness, leg pain and swelling for several days. EXAM: CT ANGIOGRAPHY CHEST WITH CONTRAST TECHNIQUE: Multidetector CT imaging of the chest was performed using the standard protocol during bolus administration of intravenous contrast. Multiplanar CT image reconstructions and MIPs were obtained to evaluate the vascular anatomy. CONTRAST:  100 mL Isovue 370 COMPARISON:  08/07/2014 FINDINGS: Cardiovascular: Satisfactory opacification of the pulmonary arteries to the segmental level. No evidence of pulmonary embolism. Normal heart size. No pericardial effusion. Mediastinum/Nodes: No enlarged mediastinal, hilar, or axillary lymph nodes. Thyroid gland, trachea, and esophagus demonstrate no  significant findings. Lungs/Pleura: Atelectasis in the lung bases. No focal consolidation. No pleural effusions. No pneumothorax. Airways are patent. Upper Abdomen: Diffuse fatty infiltration of the liver. Musculoskeletal: Endplate compression deformities of T5 and T7 vertebrae superiorly. These changes were present previously. Review of the MIP images confirms the above findings. IMPRESSION: No evidence of significant pulmonary embolus. No evidence of active pulmonary disease. Atelectasis in the lung bases. Fatty infiltration of the liver. Electronically Signed   By: Lucienne Capers M.D.   On: 07/07/2016 05:07    Procedures Procedures (including critical care time)  Medications Ordered in ED Medications  iopamidol (ISOVUE-370) 76 % injection 100 mL (100 mLs Intravenous Contrast Given 07/07/16 0453)  enoxaparin (LOVENOX) injection 70 mg (70 mg Subcutaneous Given 07/07/16 0542)     Initial Impression / Assessment and Plan / ED Course  I have reviewed the triage vital signs and the nursing notes.  Pertinent labs & imaging results that were available during my care of the patient were reviewed by me and considered in my medical decision making (see chart for details).  Patient may have DVT with a strong family history, due to her shortness of breath  and chest tightness although she's not wheezing, concern for pulmonary embolus was present. Therefore proceeded to CT angiogram of the chest. Evaluation of her legs by Doppler ultrasound will not be available for a couple more hours due to the time of day.  5:35 AM I have reviewed her CT scan. Patient was given Lovenox subcutaneous, 1 mg per KG area she was scheduled to get a Doppler ultrasound of her bilateral lower legs to look for DVT this morning while still in the ED. Patient and her daughter were given her test result. They are agreeable to waiting to have the study done.  Pt left with Dr Lita Mains at change of shift to get results of her doppler US  study.   Review of the [place] shows patient received #50 oxymorphone 10 mg tablets on May 2, and #60 diazepam 5 mg tablets on April 30. These are prescribed monthly by Dr. Freddie Apley office.  Final Clinical Impressions(s) / ED Diagnoses   Final diagnoses:  Pain in both lower legs    Disposition pending  Rolland Porter, MD, Barbette Or, MD 07/07/16 [phone]

## 2016-07-12 ENCOUNTER — Other Ambulatory Visit: Payer: Self-pay | Admitting: Family

## 2016-07-20 DIAGNOSIS — M545 Low back pain: Secondary | ICD-10-CM | POA: Diagnosis not present

## 2016-07-20 DIAGNOSIS — R252 Cramp and spasm: Secondary | ICD-10-CM | POA: Diagnosis not present

## 2016-07-20 DIAGNOSIS — F5109 Other insomnia not due to a substance or known physiological condition: Secondary | ICD-10-CM | POA: Diagnosis not present

## 2016-07-20 DIAGNOSIS — M5416 Radiculopathy, lumbar region: Secondary | ICD-10-CM | POA: Diagnosis not present

## 2016-07-20 DIAGNOSIS — Z79891 Long term (current) use of opiate analgesic: Secondary | ICD-10-CM | POA: Diagnosis not present

## 2016-07-20 LAB — HEPATIC FUNCTION PANEL
ALK PHOS: 87 U/L (ref 33–130)
ALT: 19 U/L (ref 6–29)
AST: 20 U/L (ref 10–35)
Albumin: 4 g/dL (ref 3.6–5.1)
BILIRUBIN DIRECT: 0.2 mg/dL (ref <=0.2)
BILIRUBIN INDIRECT: 0.5 mg/dL (ref 0.2–1.2)
TOTAL PROTEIN: 6.9 g/dL (ref 6.1–8.1)
Total Bilirubin: 0.7 mg/dL (ref 0.2–1.2)

## 2016-07-20 LAB — HEPATITIS B SURFACE ANTIGEN: HEP B S AG: NEGATIVE

## 2016-08-03 ENCOUNTER — Telehealth (HOSPITAL_COMMUNITY): Payer: Self-pay | Admitting: *Deleted

## 2016-08-03 NOTE — Telephone Encounter (Signed)
Office received ref for office to sch new pt appt for pt from Dr. Kenn File office. Called pt and lmtcb today 08-03-2016. Also staff tried to reach pt on 05-26-2016 and was also unable to reach pt and lmtcb. Staff called number that was provided by ref office which is [phone].

## 2016-08-04 ENCOUNTER — Other Ambulatory Visit: Payer: Self-pay | Admitting: Family

## 2016-08-04 DIAGNOSIS — K5904 Chronic idiopathic constipation: Secondary | ICD-10-CM

## 2016-08-06 ENCOUNTER — Telehealth: Payer: Self-pay | Admitting: Family Medicine

## 2016-08-06 NOTE — Telephone Encounter (Signed)
Aware.this service is not necessary for her since she is established and already visits behavioral health.

## 2016-08-10 ENCOUNTER — Ambulatory Visit (HOSPITAL_COMMUNITY): Payer: Self-pay | Admitting: Psychiatry

## 2016-08-18 ENCOUNTER — Ambulatory Visit: Payer: Medicare Other | Admitting: Family Medicine

## 2016-08-18 DIAGNOSIS — Z79891 Long term (current) use of opiate analgesic: Secondary | ICD-10-CM | POA: Diagnosis not present

## 2016-08-18 DIAGNOSIS — M5416 Radiculopathy, lumbar region: Secondary | ICD-10-CM | POA: Diagnosis not present

## 2016-08-18 DIAGNOSIS — R252 Cramp and spasm: Secondary | ICD-10-CM | POA: Diagnosis not present

## 2016-08-18 DIAGNOSIS — F5109 Other insomnia not due to a substance or known physiological condition: Secondary | ICD-10-CM | POA: Diagnosis not present

## 2016-08-18 DIAGNOSIS — M545 Low back pain: Secondary | ICD-10-CM | POA: Diagnosis not present

## 2016-08-19 ENCOUNTER — Ambulatory Visit (INDEPENDENT_AMBULATORY_CARE_PROVIDER_SITE_OTHER): Payer: Medicare Other | Admitting: Family Medicine

## 2016-08-19 ENCOUNTER — Telehealth (INDEPENDENT_AMBULATORY_CARE_PROVIDER_SITE_OTHER): Payer: Self-pay | Admitting: Internal Medicine

## 2016-08-19 ENCOUNTER — Encounter: Payer: Self-pay | Admitting: Family Medicine

## 2016-08-19 VITALS — BP 120/69 | HR 100 | Temp 97.1°F | Ht 62.0 in | Wt 160.2 lb

## 2016-08-19 DIAGNOSIS — I10 Essential (primary) hypertension: Secondary | ICD-10-CM

## 2016-08-19 DIAGNOSIS — R7303 Prediabetes: Secondary | ICD-10-CM | POA: Insufficient documentation

## 2016-08-19 DIAGNOSIS — F329 Major depressive disorder, single episode, unspecified: Secondary | ICD-10-CM

## 2016-08-19 DIAGNOSIS — F32A Depression, unspecified: Secondary | ICD-10-CM

## 2016-08-19 LAB — BAYER DCA HB A1C WAIVED: HB A1C (BAYER DCA - WAIVED): 5.8 % (ref <7.0)

## 2016-08-19 MED ORDER — LISINOPRIL 10 MG PO TABS: ORAL_TABLET | ORAL | 1 refills | Status: DC

## 2016-08-19 NOTE — Progress Notes (Signed)
   HPI  Patient presents today for follow-up for chronic medical conditions. Depression Doing much better on Cymbalta 30 mg twice daily No SI Reports good improvement She is planning to see psychiatry.  Prediabetes Not watching diet This has been discussed with the patient. She is not fasting today.  Hypertension Needs refill of lisinopril, patient does not have any chest pains except for with anxiety, these are improved with Valium. No headaches, dyspnea, palpitations, or concerning leg edema.  PMH: Smoking status noted ROS: Per HPI  Objective: BP 120/69   Pulse 100   Temp 97.1 F (36.2 C) (Oral)   Ht 5\' 2"  (1.575 m)   Wt 160 lb 3.2 oz (72.7 kg)   BMI 29.30 kg/m  Gen: NAD, alert, cooperative with exam HEENT: NCAT CV: RRR, good S1/S2, no murmur Resp: CTABL, no wheezes, non-labored Ext: No edema, warm Neuro: Alert and oriented, No gross deficits  Depression screen [facility] 2/9 08/19/2016 06/17/2016 05/17/2016  Decreased Interest 1 2 2   Down, Depressed, Hopeless 0 1 2  PHQ - 2 Score 1 3 4   Altered sleeping - 1 3  Tired, decreased energy - 3 3  Change in appetite - 0 3  Feeling bad or failure about yourself  - 1 2  Trouble concentrating - 0 3  Moving slowly or fidgety/restless - 1 2  Suicidal thoughts - 0 2  PHQ-9 Score - 9 22  Difficult doing work/chores - Somewhat difficult Somewhat difficult  Some recent data might be hidden     Assessment and plan:  # Depression Doing well, patient usual dose of Cymbalta 60 mg total daily. I do recommend that she see psychiatry given difficulty managing mood in the past and controlled substance use.  # Prediabetes Patient not watching her diet, repeat A1c Clinically stable with no signs of diabetes  # Hypertension Well-controlled Labs up-to-date Refill lisinopril    Orders Placed This Encounter  Procedures  . Bayer DCA Hb A1c Waived    Meds ordered this encounter  Medications  . tiZANidine (ZANAFLEX) 4 MG tablet     Sig: Take 1 tablet by mouth 3 (three) times daily.  Marland Kitchen lisinopril (PRINIVIL,ZESTRIL) 10 MG tablet    Sig: TAKE ONE (1) TABLET EACH DAY    Dispense:  90 tablet    Refill:  [place], MD [facility] 08/19/2016, 3:18 PM

## 2016-08-19 NOTE — Patient Instructions (Signed)
Great to see you!  Come back in 4 months to see your PCP unless you need [place] sooner.   I am glad you are feeling better on the medication.   I have refilled lisinopril.

## 2016-08-19 NOTE — Telephone Encounter (Signed)
Patient called, she would like her lab results.  She also states it is time for her to have a colonoscopy and wants to discuss having it here or should she go to [facility]?  Patient was informed that Karna Christmas will be back on Monday.    [phone]
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
## 2016-08-23 ENCOUNTER — Telehealth (INDEPENDENT_AMBULATORY_CARE_PROVIDER_SITE_OTHER): Payer: Self-pay | Admitting: Internal Medicine

## 2016-08-23 DIAGNOSIS — B182 Chronic viral hepatitis C: Secondary | ICD-10-CM

## 2016-08-23 NOTE — Telephone Encounter (Signed)
No answer. Will call back later. 

## 2016-08-23 NOTE — Telephone Encounter (Signed)
Teresa [name], US abdomen. Dx Hepatitis C

## 2016-08-23 NOTE — Telephone Encounter (Signed)
Patient called, stated that she's sorry she missed your call this morning.  She is calling for lab results.  [phone]

## 2016-08-23 NOTE — Telephone Encounter (Signed)
Results have been given to patient. Needs recall for US abdomen

## 2016-08-24 ENCOUNTER — Ambulatory Visit (INDEPENDENT_AMBULATORY_CARE_PROVIDER_SITE_OTHER): Payer: Medicare Other | Admitting: Psychiatry

## 2016-08-24 ENCOUNTER — Encounter (HOSPITAL_COMMUNITY): Payer: Self-pay | Admitting: Psychiatry

## 2016-08-24 VITALS — BP 146/93 | HR 95 | Ht 62.0 in | Wt 159.8 lb

## 2016-08-24 DIAGNOSIS — Z813 Family history of other psychoactive substance abuse and dependence: Secondary | ICD-10-CM | POA: Diagnosis not present

## 2016-08-24 DIAGNOSIS — F331 Major depressive disorder, recurrent, moderate: Secondary | ICD-10-CM | POA: Diagnosis not present

## 2016-08-24 DIAGNOSIS — Z7951 Long term (current) use of inhaled steroids: Secondary | ICD-10-CM

## 2016-08-24 DIAGNOSIS — Z886 Allergy status to analgesic agent status: Secondary | ICD-10-CM

## 2016-08-24 DIAGNOSIS — F419 Anxiety disorder, unspecified: Secondary | ICD-10-CM

## 2016-08-24 DIAGNOSIS — Z888 Allergy status to other drugs, medicaments and biological substances status: Secondary | ICD-10-CM

## 2016-08-24 DIAGNOSIS — Z87891 Personal history of nicotine dependence: Secondary | ICD-10-CM | POA: Diagnosis not present

## 2016-08-24 DIAGNOSIS — Z79899 Other long term (current) drug therapy: Secondary | ICD-10-CM

## 2016-08-24 DIAGNOSIS — G8929 Other chronic pain: Secondary | ICD-10-CM | POA: Diagnosis not present

## 2016-08-24 DIAGNOSIS — J45909 Unspecified asthma, uncomplicated: Secondary | ICD-10-CM | POA: Diagnosis not present

## 2016-08-24 DIAGNOSIS — Z881 Allergy status to other antibiotic agents status: Secondary | ICD-10-CM | POA: Diagnosis not present

## 2016-08-24 DIAGNOSIS — K219 Gastro-esophageal reflux disease without esophagitis: Secondary | ICD-10-CM | POA: Diagnosis not present

## 2016-08-24 DIAGNOSIS — I10 Essential (primary) hypertension: Secondary | ICD-10-CM | POA: Diagnosis not present

## 2016-08-24 MED ORDER — DULOXETINE HCL 30 MG PO CPEP: 30.0000 mg | ORAL_CAPSULE | Freq: Two times a day (BID) | ORAL | 1 refills | Status: DC

## 2016-08-24 NOTE — Patient Instructions (Addendum)
1. Continue duloxetine 30 mg twice a day 2. Decrease valium 5 mg daily as needed for anxiety  3. Return to clinic in one month for 30 mins 4. Referral to therapy

## 2016-08-24 NOTE — Telephone Encounter (Signed)
[place] sch'd 08/30/16 at 1030 ([id number]), npo after midnight, patient aware

## 2016-08-30 ENCOUNTER — Ambulatory Visit (HOSPITAL_COMMUNITY)
Admission: RE | Admit: 2016-08-30 | Discharge: 2016-08-30 | Disposition: A | Discharge status: Home / Self Care | Payer: Medicare Other | Source: Ambulatory Visit | Attending: Internal Medicine | Admitting: Internal Medicine

## 2016-08-30 DIAGNOSIS — Z9049 Acquired absence of other specified parts of digestive tract: Secondary | ICD-10-CM | POA: Insufficient documentation

## 2016-08-30 DIAGNOSIS — B182 Chronic viral hepatitis C: Secondary | ICD-10-CM | POA: Insufficient documentation

## 2016-08-30 DIAGNOSIS — K760 Fatty (change of) liver, not elsewhere classified: Secondary | ICD-10-CM | POA: Diagnosis not present

## 2016-09-07 ENCOUNTER — Encounter (HOSPITAL_COMMUNITY): Payer: Self-pay | Admitting: Licensed Clinical Social Worker

## 2016-09-07 ENCOUNTER — Ambulatory Visit (INDEPENDENT_AMBULATORY_CARE_PROVIDER_SITE_OTHER): Payer: Medicare Other | Admitting: Licensed Clinical Social Worker

## 2016-09-07 DIAGNOSIS — F330 Major depressive disorder, recurrent, mild: Secondary | ICD-10-CM | POA: Diagnosis not present
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
## 2016-09-13 ENCOUNTER — Other Ambulatory Visit: Payer: Self-pay | Admitting: Family Medicine

## 2016-09-13 ENCOUNTER — Other Ambulatory Visit: Payer: Self-pay | Admitting: Family

## 2016-09-13 DIAGNOSIS — K5904 Chronic idiopathic constipation: Secondary | ICD-10-CM

## 2016-09-15 NOTE — Telephone Encounter (Signed)
na

## 2016-09-21 ENCOUNTER — Ambulatory Visit (INDEPENDENT_AMBULATORY_CARE_PROVIDER_SITE_OTHER): Payer: Medicare Other | Admitting: Licensed Clinical Social Worker

## 2016-09-21 ENCOUNTER — Encounter (HOSPITAL_COMMUNITY): Payer: Self-pay | Admitting: Licensed Clinical Social Worker

## 2016-09-21 DIAGNOSIS — F330 Major depressive disorder, recurrent, mild: Secondary | ICD-10-CM | POA: Diagnosis not present
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
## 2016-09-23 ENCOUNTER — Ambulatory Visit (INDEPENDENT_AMBULATORY_CARE_PROVIDER_SITE_OTHER): Payer: Medicare Other | Admitting: Psychiatry

## 2016-09-23 ENCOUNTER — Encounter (HOSPITAL_COMMUNITY): Payer: Self-pay | Admitting: Psychiatry

## 2016-09-23 ENCOUNTER — Ambulatory Visit (HOSPITAL_COMMUNITY): Payer: Self-pay | Admitting: Psychiatry

## 2016-09-23 VITALS — BP 142/87 | HR 93 | Ht 62.0 in | Wt 163.2 lb

## 2016-09-23 DIAGNOSIS — Z87891 Personal history of nicotine dependence: Secondary | ICD-10-CM | POA: Diagnosis not present

## 2016-09-23 DIAGNOSIS — Z813 Family history of other psychoactive substance abuse and dependence: Secondary | ICD-10-CM | POA: Diagnosis not present

## 2016-09-23 DIAGNOSIS — F331 Major depressive disorder, recurrent, moderate: Secondary | ICD-10-CM

## 2016-09-23 DIAGNOSIS — G4700 Insomnia, unspecified: Secondary | ICD-10-CM

## 2016-09-23 MED ORDER — DULOXETINE HCL 30 MG PO CPEP: ORAL_CAPSULE | ORAL | 1 refills | Status: DC

## 2016-09-23 NOTE — Patient Instructions (Signed)
1. Increase duloxetine 60 mg daily and 30 mg in the afternoon 2. Continue Vailum 5 mg daily as needed for anxiety 3. Return to clinic in one month for 30 mins

## 2016-09-27 ENCOUNTER — Ambulatory Visit (INDEPENDENT_AMBULATORY_CARE_PROVIDER_SITE_OTHER): Payer: Medicare Other | Admitting: Internal Medicine

## 2016-09-29 ENCOUNTER — Ambulatory Visit (INDEPENDENT_AMBULATORY_CARE_PROVIDER_SITE_OTHER): Payer: Medicare Other | Admitting: Internal Medicine

## 2016-09-30 ENCOUNTER — Telehealth (HOSPITAL_COMMUNITY): Payer: Self-pay | Admitting: *Deleted

## 2016-09-30 NOTE — Telephone Encounter (Signed)
Pt called at 4:20 pm on 09-30-2016 and lm stating that provider changed her medication for Cymbalta strength and she don't think she sent in refills to her pharmacy. Per pt she would like for provider to please send in refills to her pharmacy with the new strength. Staff called pt pharmacy and spoke with [place] who gived the phone to the pharmacist. Per pharmacist, they have pt script that was sent on 09-23-2016 and they have already spoken with pt and informed her that her script was there ready for pick up. Per pharmacist, pt informed them that she didn't know if that script was correct so she never picked it up. Staff verified provider's directions for pt Cymbalta and it was correct. Informed pharmacist staff will call pt to inform her that her medication is ready for pick up.  Called pt and informed her with script directions and pt verbalized understanding. Staff also informed pt her pharmacy stated her medication was ready for pick up and pt verbalized understanding.

## 2016-10-01 ENCOUNTER — Other Ambulatory Visit: Payer: Self-pay | Admitting: Family Medicine

## 2016-10-01 ENCOUNTER — Other Ambulatory Visit: Payer: Self-pay | Admitting: Family

## 2016-10-05 IMAGING — RF DG ESOPHAGUS
12 of 24 series · 12 of 24 positions shown · non-contrast
Comparison: No priors.

CLINICAL DATA: 59-year-old female with prior history of esophageal
dilatation in 7708. Difficulty swallowing food and drink
intermittently for 1 month. History of polyps removed from the
throat 10 years ago.

EXAM:
ESOPHOGRAM / BARIUM SWALLOW / BARIUM TABLET STUDY
TECHNIQUE: Combined double contrast and single contrast examination performed
using effervescent crystals, thick barium liquid, and thin barium
liquid. The patient was observed with fluoroscopy swallowing a 13 mm
barium sulphate tablet.
FLUOROSCOPY TIME:  Fluoroscopy Time:  3 minutes and 18 seconds
Number of Acquired Images:  30 series with multiple images.

[Series 2: run · 1 of 1 slices shown (1 of 12)]
[im 1/1]
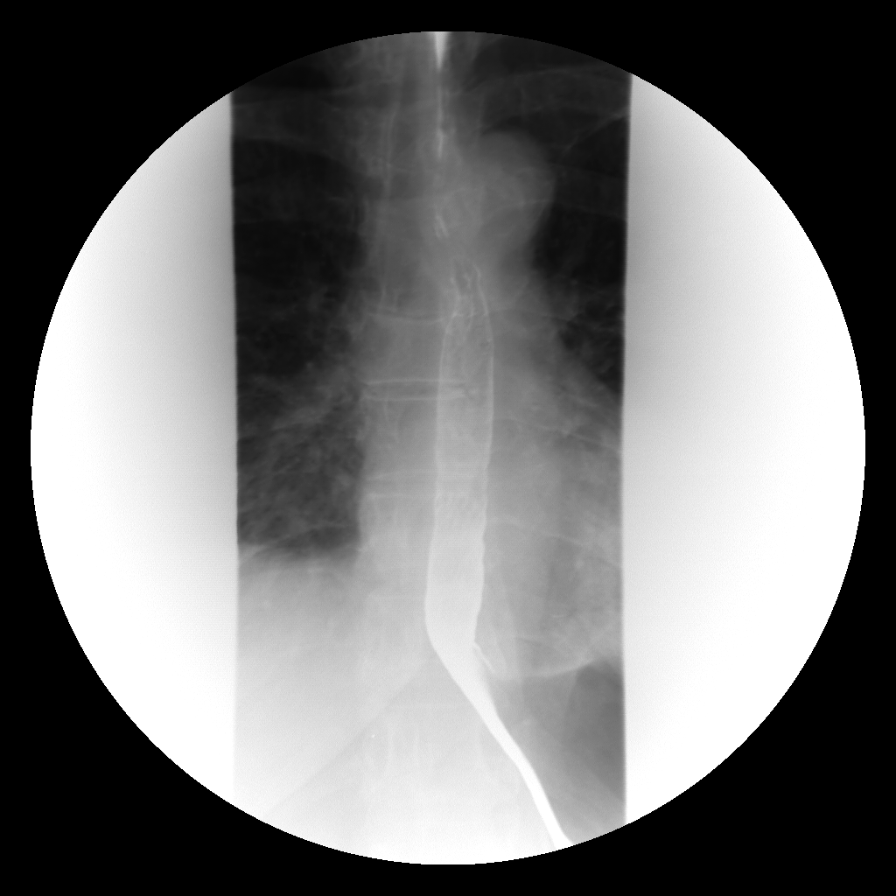

[Series 4: run · 1 of 1 slices shown (2 of 12)]
[im 1/1]
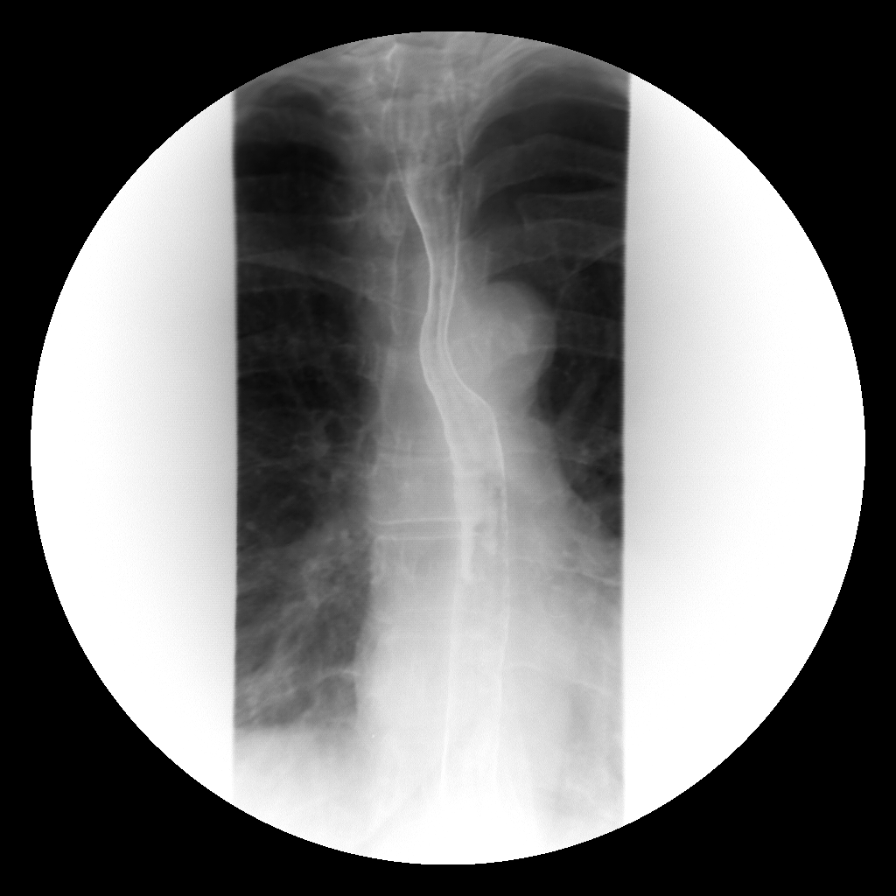

[Series 6: run · 1 of 1 slices shown (3 of 12)]
[im 1/1]
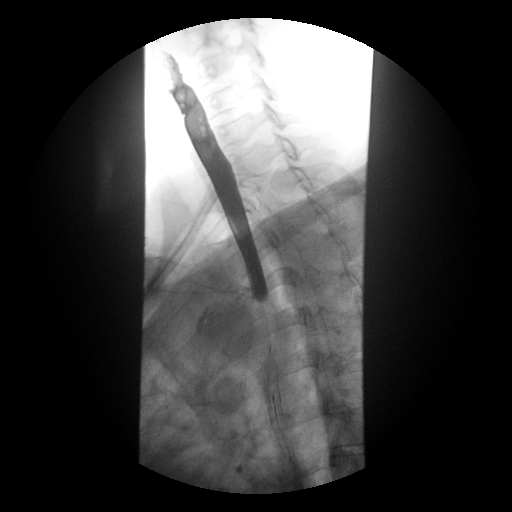

[Series 8: run · 1 of 1 slices shown (4 of 12)]
[im 1/1]
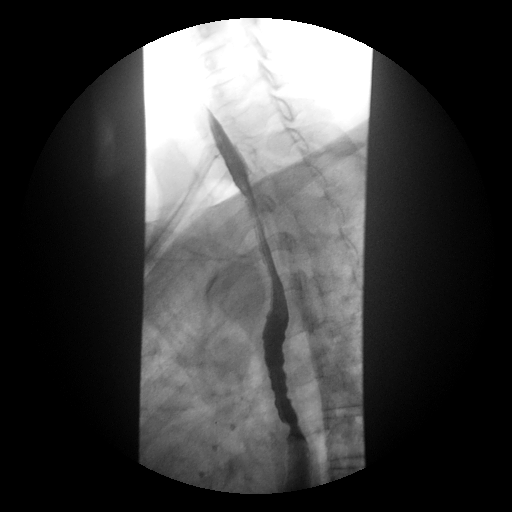

[Series 10: run · 1 of 1 slices shown (5 of 12)]
[im 1/1]
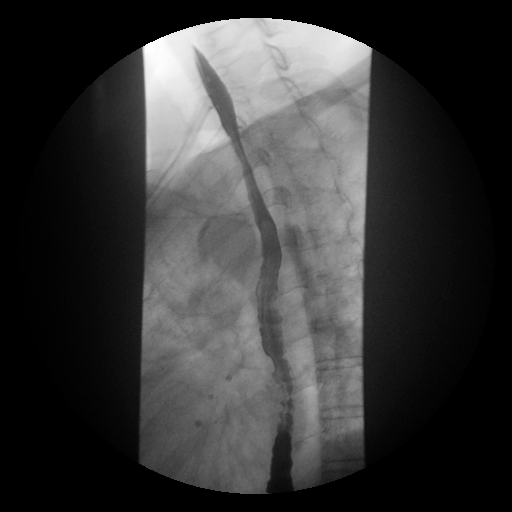

[Series 12: run · 1 of 1 slices shown (6 of 12)]
[im 1/1]
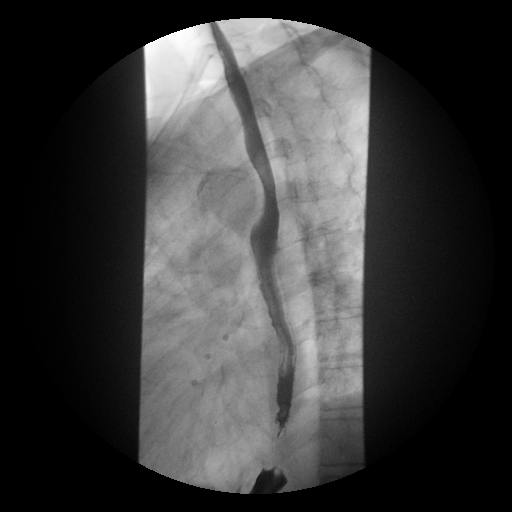

[Series 14: run · 1 of 1 slices shown (7 of 12)]
[im 1/1]
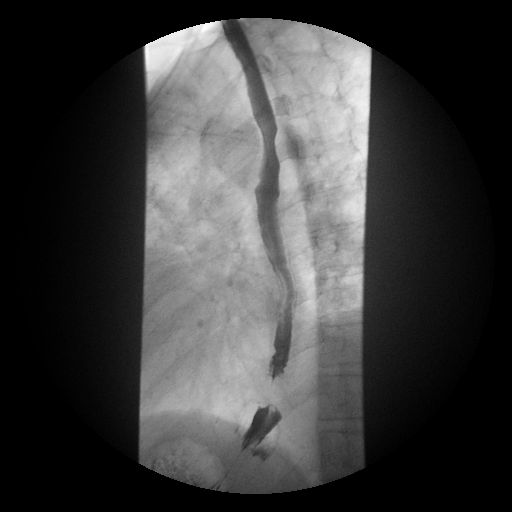

[Series 16: run · 1 of 1 slices shown (8 of 12)]
[im 1/1]
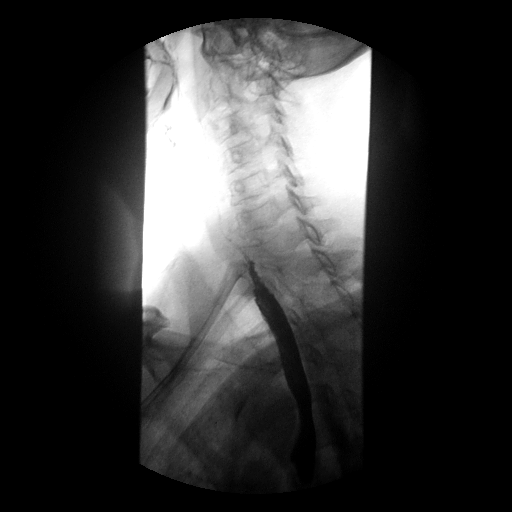

[Series 18: run · 1 of 1 slices shown (9 of 12)]
[im 1/1]
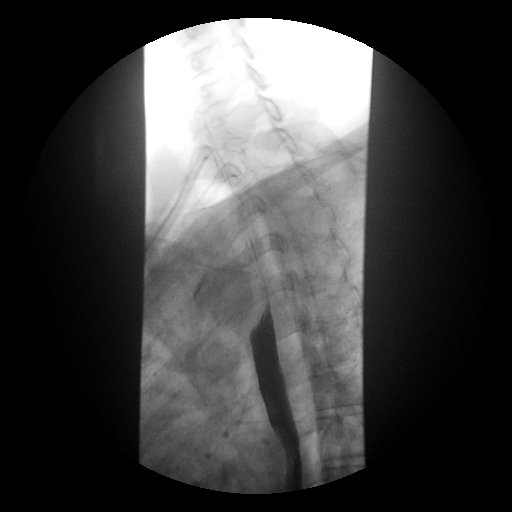

[Series 20: run · 1 of 1 slices shown (10 of 12)]
[im 1/1]
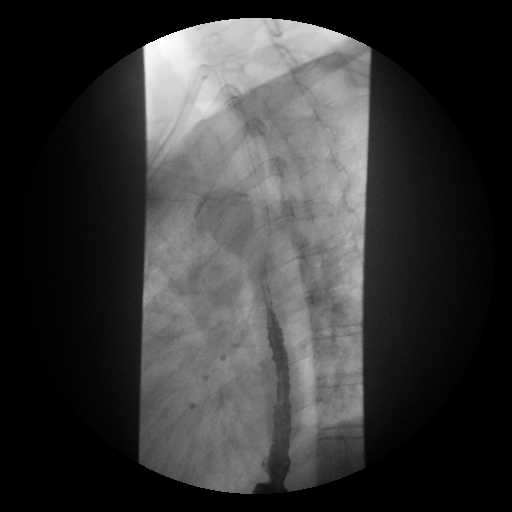

[Series 22: run · 1 of 1 slices shown (11 of 12)]
[im 1/1]
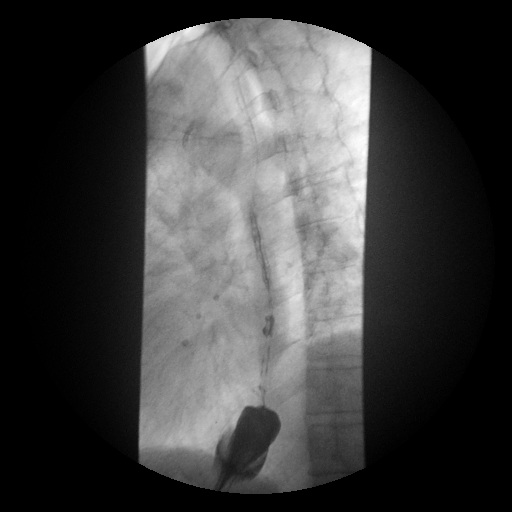

[Series 24: run · 1 of 1 slices shown (12 of 12)]
[im 1/1]
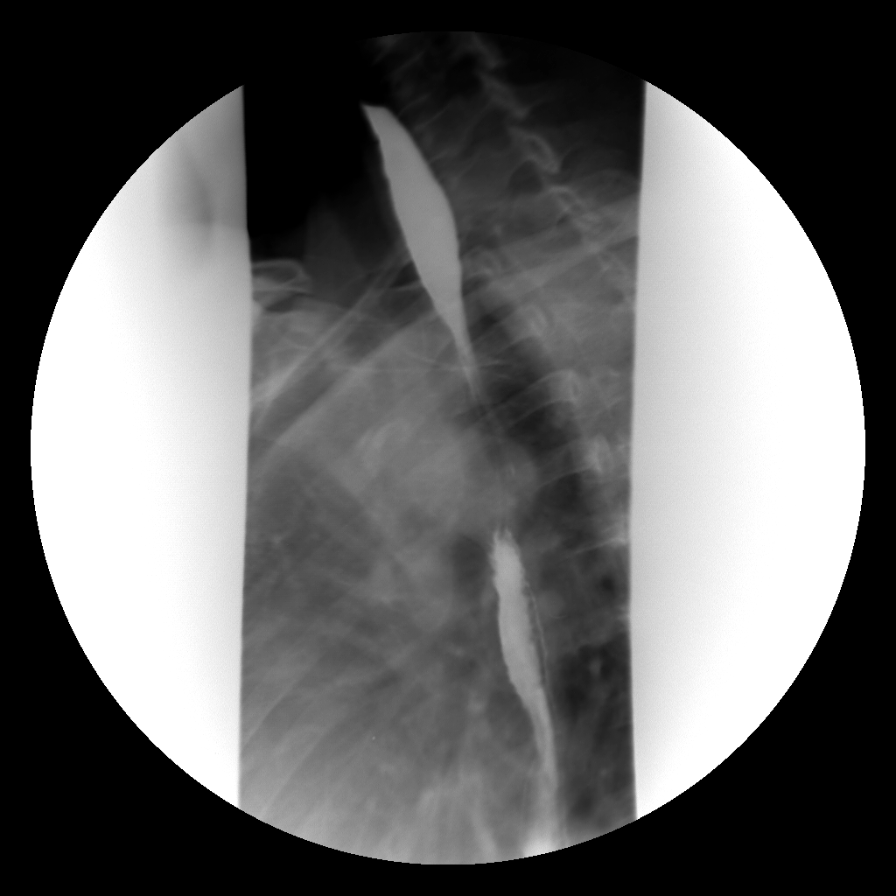

[12 of 24 positions shown; findings below may reference images not displayed]

FINDINGS: Initial double contrast images of the esophagus demonstrated a
normal appearance of the esophageal mucosa. Multiple single swallow
attempts were observed, which demonstrated intermittent failure to
propagate any normal primary peristaltic wave. Several tertiary
contractions were observed. Full column esophagram demonstrated no
esophageal mass, stricture or esophageal ring. A small hiatal hernia
was noted. Water siphon test demonstrated no observable
gastroesophageal reflux during the examination. A barium tablet was
administered, which passed readily into the stomach.
IMPRESSION: 1. No evidence of recurrent stricture.
2. Nonspecific esophageal motility disorder with occasional tertiary
contractions.
3. Small hiatal hernia.
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
## 2016-10-14 ENCOUNTER — Ambulatory Visit (INDEPENDENT_AMBULATORY_CARE_PROVIDER_SITE_OTHER): Payer: Medicare Other | Admitting: Internal Medicine

## 2016-10-15 ENCOUNTER — Ambulatory Visit (INDEPENDENT_AMBULATORY_CARE_PROVIDER_SITE_OTHER): Payer: Medicare Other | Admitting: Family Medicine

## 2016-10-15 ENCOUNTER — Encounter: Payer: Self-pay | Admitting: Family Medicine

## 2016-10-15 VITALS — BP 126/84 | HR 95 | Temp 97.1°F | Ht 62.0 in | Wt 169.4 lb

## 2016-10-15 DIAGNOSIS — F331 Major depressive disorder, recurrent, moderate: Secondary | ICD-10-CM

## 2016-10-15 DIAGNOSIS — I10 Essential (primary) hypertension: Secondary | ICD-10-CM

## 2016-10-15 DIAGNOSIS — J4540 Moderate persistent asthma, uncomplicated: Secondary | ICD-10-CM | POA: Diagnosis not present

## 2016-10-15 NOTE — Progress Notes (Signed)
   HPI  Patient presents today here to follow-up for chronic medical conditions.  Teresa [name] Patient has established with psychiatry and is doing well. She explains her recent medication changes. She feels that she is getting very good care. She denies suicidal thoughts today  Teresa [name] Good medication compliance No chest pain, dyspnea, palpitations, leg edema.  Teresa [name] Patient using Symbicort occasionally, using albuterol once or twice a day on most days. We had a lengthy discussion talking about rescue inhalers versus controller inhalers  PMH: Smoking status noted ROS: Per HPI  Objective: BP 126/84   Pulse 95   Temp (!) 97.1 F (36.2 C) (Oral)   Ht 5\' 2"  (1.575 m)   Wt 169 lb 6.4 oz (76.8 kg)   BMI 30.98 kg/m  Gen: NAD, alert, cooperative with exam HEENT: NCAT CV: RRR, good S1/S2, no murmur Resp: CTABL, no wheezes, non-labored Ext: No edema, warm Neuro: Alert and oriented, No gross deficits  Teresa [name] screen [facility] 2/9 08/19/2016 06/17/2016 05/17/2016 03/04/2016 01/27/2016  Decreased Interest 1 2 2 1 1   Down, Depressed, Hopeless 0 1 2 0 1  PHQ - 2 Score 1 3 4 1 2   Altered sleeping - 1 3 - 2  Tired, decreased energy - 3 3 - 2  Change in appetite - 0 3 - 1  Feeling bad or failure about yourself  - 1 2 - 1  Trouble concentrating - 0 3 - 1  Moving slowly or fidgety/restless - 1 2 - 0  Suicidal thoughts - 0 2 - 0  PHQ-9 Score - 9 22 - 9  Difficult doing work/chores - Somewhat difficult Somewhat difficult - Not difficult at all  Some recent data might be hidden     Assessment and plan:  # Major depressive disorder Patient doing very well with psychiatry Symptoms much improved I appreciate psychiatry's excellent management  # Teresa [name] Well-controlled No changes Labs are up-to-date  # Teresa [name] Uncontrolled with approximately twice daily dosing on most days Recommended using her Symbicort twice daily, had a discussion about rescue versus controller  inhalers   Laroy Apple, MD [facility] 10/15/2016, 3:48 PM

## 2016-10-18 ENCOUNTER — Ambulatory Visit (INDEPENDENT_AMBULATORY_CARE_PROVIDER_SITE_OTHER): Payer: Medicare Other | Admitting: Internal Medicine

## 2016-10-18 ENCOUNTER — Encounter (INDEPENDENT_AMBULATORY_CARE_PROVIDER_SITE_OTHER): Payer: Self-pay | Admitting: Internal Medicine

## 2016-10-18 ENCOUNTER — Ambulatory Visit (INDEPENDENT_AMBULATORY_CARE_PROVIDER_SITE_OTHER): Payer: Medicare Other | Admitting: Licensed Clinical Social Worker

## 2016-10-18 VITALS — BP 112/72 | HR 64 | Temp 98.3°F | Ht 62.0 in | Wt 165.4 lb

## 2016-10-18 DIAGNOSIS — F330 Major depressive disorder, recurrent, mild: Secondary | ICD-10-CM

## 2016-10-18 DIAGNOSIS — B182 Chronic viral hepatitis C: Secondary | ICD-10-CM

## 2016-10-18 NOTE — Progress Notes (Signed)
   THERAPIST PROGRESS NOTE  Session Time: 2:40 pm- 3:25 pm  Participation Level: Active  Behavioral Response: CasualAlertEuthymic  Type of Therapy: Individual Therapy  Treatment Goals addressed: Coping  Interventions: CBT and Solution Focused  Summary: YI FALLETTA is a 61 y.o. female who presents with   Patient is a 61 year old Caucasian female that presents oriented x5 (person, place, situation, time, and object), alert, soft spoken, well groomed, appropriately dressed and cooperative for an assessment on a referral from Dr. Modesta Messing and PCP to address mood. Patient has a history of medical treatment that causes pain all over the boy and mental health treatment that includes medication management and outpatient therapy. Patient denies symptoms of mania. Patient denies suicidal and homicidal ideations. She denies psychosis including auditory and visual hallucinations. Patient denies current substance use but admits to a history of substance abuse. Patient is at no risk for lethality at this time.  Patient has an average score of 6 out of 10 on the Outcome Rating Scale. Patient reported that she had being doing well before today. She reported that she has been working on her sleep. She reported that she has been waking up at noon instead of later. Patient reported that her anxiety increased today due to her granddaughter disappearing this morning. Patient reported that a neighbor went to the hospital yesterday. She dropped her granddaughter off at her boyfriends home then went to the hospital. She reported that her granddaughter was dropped off at the hospital and she came in to get the patient's car keys so she could sleep in the car. Patient reported that she has not seen her since and is really worried. Patient noted that she has called her granddaughters boyfriend and is going to call the police if she doesn't hear from her. Patient committed to take steps to find her grandchild and then when  things calm down work on regulating her sleep. Patient rated the session 10 out of 10 on the Session Rating Scale.   Patient engaged in session. She responded well to interventions. Patient continues to meet criteria for Major depressive disorder, recurrent, mild with anxious distress. Patient will continue in outpatient therapy due to being the least restrictive service to meet her needs. Patient made minimal progress on her goals.   Suicidal/Homicidal: Negativewithout intent/plan  Therapist Response: Therapist reviewed patient's recent thoughts and behaviors. Therapist utilized CBT to address mood. Therapist followed up on patient's homework. Therapist processed patient's feelings to identify triggers for anxiety. Therapist helped patient identify options to dealing with her granddaughter missing. Therapist committed patient to take steps to find her granddaughter and when things are calm, work on regulating her sleep.  Therapist administered the Outcome Rating Scale and Session Rating Scale.   Plan: Return again in 3  weeks. Therapist will review goals on or before 10.10.2018  Diagnosis: Axis I: Major depressive disorder, recurrent episode, mild with anxious distress    Axis II: No diagnosis    Glori Bickers, LCSW 10/18/2016

## 2016-10-18 NOTE — Patient Instructions (Signed)
OV in 1 year.  

## 2016-10-18 NOTE — Progress Notes (Signed)
Subjective:    Patient ID: Teresa [name], female    DOB: [birth year], 61 y.o.   MRN: [id number]  HPI Here today for f/u. Last seen in January. Hx of Hepatitis C. Successfully treated in 2017.  03/29/2016 undetected.   She tells me she is doing good. She does not work. She is disabled. Her appetite is good. No weight.  She has gained from 158 to 165.4. She is exercising by walking and go up and down steps 08/30/2016 US abdomen: IMPRESSION:  1. Liver is prominent. Increased hepatic echogenicity is again noted consistent with fatty infiltration and/or hepatocellular disease . No focal hepatic abnormality identified.  2. Cholecystectomy.  No biliary distention   Hepatic Function Latest Ref Rng & Units 07/19/2016 07/07/2016 03/29/2016  Total Protein 6.1 - 8.1 g/dL 6.9 6.8 7.2  Albumin 3.6 - 5.1 g/dL 4.0 3.6 4.1  AST 10 - 35 U/L 20 34 40(H)  ALT 6 - 29 U/L 19 28 43(H)  Alk Phosphatase 33 - 130 U/L 87 75 84  Total Bilirubin 0.2 - 1.2 mg/dL 0.7 0.6 1.0  Bilirubin, Direct <=0.2 mg/dL 0.2 - 0.3(H)    Hx o      In March of 2017 she underwent a DG Esophagram for dysphagia.IMPRESSION: 1. No evidence of recurrent stricture. 2. Nonspecific esophageal motility disorder with occasional tertiary contractions. 3. Small hiatal hernia.Last colonoscopy was in 2015 by Dr. Henrene Pastor. Recently lost her grandson in November to a drug over dose.  12/12/2015 [place] RUQ;  IMPRESSION: Persistent increased hepatic echotexture consistent with fatty infiltration or early cirrhosis. There is no focal mass or ductal dilation. No ascites is observed in the right upper quadrant. 07/18/2013 EGD/ED: Dr. Henrene Pastor, dysphagia: Normal EGD, Status post [facility] dilation for dysphagia.[facility] dilator passed into the esophagus without resistance or heme).    Hx of EGD/ED in the past by Dr Henrene Pastor at [facility] in 2015. She has had multiple EGD/ED   Review of Systems Past Medical History:  Diagnosis Date  . Anxiety  state, unspecified   . Asthma    prn inhaler  . Chronic back pain greater than 3 months duration   . Colon polyp   . Degeneration of intervertebral disc, site unspecified   . Depressive disorder, not elsewhere classified   . Diverticulitis   . Diverticulosis 07-08-2005   colonoscopy  . Esophageal stricture 2014  . Full dentures   . GERD (gastroesophageal reflux disease) 07-08-2005   EGD  . Hiatal hernia 07-08-2005   EGD  . Insomnia   . Irritable bowel syndrome   . Migraine   . Muscle spasm   . Osteoarthrosis, unspecified whether generalized or localized, lower leg   . Papilloma of breast 09/2011   left  . Seasonal allergies   . Seizures ([place]) 1990s   x 1, unknown cause; no seizures since  . Vocal cord polyp    history of    Past Surgical History:  Procedure Laterality Date  . ABDOMINAL HYSTERECTOMY     partial  . BILATERAL SALPINGOOPHORECTOMY    . BLADDER SURGERY     bladder tack  . BREAST BIOPSY  11/03/2011   Procedure: BREAST BIOPSY WITH NEEDLE LOCALIZATION;  Surgeon: Harl Bowie, MD;  Location: [place];  Service: General;  Laterality: Left;  needle localized left breast biopsy  . CHOLECYSTECTOMY    . COLONOSCOPY  07/08/2005   562.10  . CYSTOSCOPY  08/24/2011   Procedure: CYSTOSCOPY;  Surgeon: Reece Packer, MD;  [facility];  Service: Urology;  Laterality: N/A;  Cystoscopy,  Rectocele Repair and Vault Prolapse Repair  . ESOPHAGOGASTRODUODENOSCOPY  10/03/2012   multiple   . OTHER SURGICAL HISTORY     exc. vocal cord polyp  . RECTAL TUMOR BY PROCTOTOMY EXCISION    . RECTOCELE REPAIR  08/24/2011   Procedure: POSTERIOR REPAIR (RECTOCELE);  Surgeon: Reece Packer, MD;  [facility];  Service: Urology;  Laterality: N/A;  . rectocelle repair    . TUMOR REMOVAL     benign  . VAGINAL PROLAPSE REPAIR  08/24/2011   Procedure: VAGINAL VAULT SUSPENSION;  Surgeon: Reece Packer, MD;  [facility];  Service: Urology;  Laterality:  N/A;    Allergies  Allergen Reactions  . Lyrica [Pregabalin] Shortness Of Breath  . Trazodone And Nefazodone Shortness Of Breath  . Amitriptyline   . Arthrotec [Diclofenac-Misoprostol] Other (See Comments)    Upset stomach  . Keflex [Cephalexin] Other (See Comments)    Unknown reaction  . Naproxen Other (See Comments)    Irritates stomach  . Prozac [Fluoxetine Hcl] Other (See Comments)    "makes me feel bad"  . Ambien [Zolpidem Tartrate] Other (See Comments)    COULDN'T FUNCTION, STAYED SLEEPY    Current Outpatient Prescriptions on File Prior to Visit  Medication Sig Dispense Refill  . budesonide-formoterol (SYMBICORT) 160-4.5 MCG/ACT inhaler Inhale 2 puffs into the lungs 2 (two) times daily. 1 Inhaler 3  . cyclobenzaprine (FLEXERIL) 10 MG tablet 2 (two) times daily after a meal.     . DEXILANT 60 MG capsule TAKE ONE (1) CAPSULE EACH DAY 90 capsule 0  . diazepam (VALIUM) 5 MG tablet Take 5 mg by mouth daily.     Marland Kitchen docusate sodium (COLACE) 100 MG capsule Take 100-200 mg by mouth daily as needed for mild constipation.     . DULoxetine (CYMBALTA) 30 MG capsule 60 mg daily and 30 mg in the afternoon 90 capsule 1  . lisinopril (PRINIVIL,ZESTRIL) 10 MG tablet TAKE ONE (1) TABLET EACH DAY 90 tablet 1  . MAGNESIUM PO Take 1 tablet by mouth daily. Reported on 07/10/2015    . mometasone (NASONEX) 50 MCG/ACT nasal spray USE 2 SPRAYS IN EACH NOSTRIL ONCE DAILY 51 g 3  . montelukast (SINGULAIR) 10 MG tablet TAKE ONE (1) TABLET EACH DAY 90 tablet 2  . Olopatadine HCl 0.2 % SOLN Place 1 drop into both eyes daily as needed for irritation.    Marland Kitchen oxymorphone (OPANA) 10 MG tablet Take 10 mg by mouth 2 (two) times daily before a meal.    . polyethylene glycol powder (GLYCOLAX/MIRALAX) powder MIX 17 GRAMS INTO 8OZ OF WATER AND DRINK DAILY 527 g 0  . PROAIR HFA 108 (90 Base) MCG/ACT inhaler INHALE 2 PUFFS EVERY 4 HOURS AS NEEDED 25.5 g 4  . Probiotic Product (PROBIOTIC DAILY PO) Take by mouth.    .  promethazine (PHENERGAN) 25 MG tablet Take 25 mg by mouth as needed for nausea.      No current facility-administered medications on file prior to visit.         Objective:   Physical Exam  Blood pressure 112/72, pulse 64, temperature 98.3 F (36.8 C), height 5' 2"  (1.575 m), weight 165 lb 6.4 oz (75 kg). Alert and oriented. Skin warm and dry. Oral mucosa is moist.   . Sclera anicteric, conjunctivae is pink. Thyroid not enlarged. No cervical lymphadenopathy. Lungs clear. Heart regular rate and rhythm.  Abdomen is soft.  Bowel sounds are positive. No hepatomegaly. No abdominal masses felt. No tenderness.  No edema to lower extremities.          Assessment & Plan:  Hepatitis C. She has cleared the virus. Will get Hep C quaint, Hepatic and AFP

## 2016-10-19 ENCOUNTER — Ambulatory Visit (INDEPENDENT_AMBULATORY_CARE_PROVIDER_SITE_OTHER): Payer: Medicare Other | Admitting: Psychiatry

## 2016-10-19 ENCOUNTER — Telehealth (HOSPITAL_COMMUNITY): Payer: Self-pay | Admitting: *Deleted

## 2016-10-19 ENCOUNTER — Encounter (HOSPITAL_COMMUNITY): Payer: Self-pay | Admitting: Psychiatry

## 2016-10-19 VITALS — BP 111/86 | HR 86 | Ht 62.0 in | Wt 164.0 lb

## 2016-10-19 DIAGNOSIS — Z813 Family history of other psychoactive substance abuse and dependence: Secondary | ICD-10-CM

## 2016-10-19 DIAGNOSIS — Z599 Problem related to housing and economic circumstances, unspecified: Secondary | ICD-10-CM

## 2016-10-19 DIAGNOSIS — Z634 Disappearance and death of family member: Secondary | ICD-10-CM | POA: Diagnosis not present

## 2016-10-19 DIAGNOSIS — F330 Major depressive disorder, recurrent, mild: Secondary | ICD-10-CM | POA: Diagnosis not present

## 2016-10-19 DIAGNOSIS — G4700 Insomnia, unspecified: Secondary | ICD-10-CM

## 2016-10-19 DIAGNOSIS — Z87891 Personal history of nicotine dependence: Secondary | ICD-10-CM | POA: Diagnosis not present

## 2016-10-19 DIAGNOSIS — F419 Anxiety disorder, unspecified: Secondary | ICD-10-CM | POA: Diagnosis not present

## 2016-10-19 LAB — HEPATIC FUNCTION PANEL
ALT: 26 U/L (ref 6–29)
AST: 28 U/L (ref 10–35)
Albumin: 3.9 g/dL (ref 3.6–5.1)
Alkaline Phosphatase: 87 U/L (ref 33–130)
BILIRUBIN DIRECT: 0.2 mg/dL (ref <=0.2)
BILIRUBIN INDIRECT: 0.5 mg/dL (ref 0.2–1.2)
BILIRUBIN TOTAL: 0.7 mg/dL (ref 0.2–1.2)
Total Protein: 6.5 g/dL (ref 6.1–8.1)

## 2016-10-19 LAB — AFP TUMOR MARKER: AFP TUMOR MARKER: 3.2 ng/mL (ref <6.1)

## 2016-10-19 MED ORDER — DULOXETINE HCL 30 MG PO CPEP
ORAL_CAPSULE | ORAL | 1 refills | Status: DC
Start: 2016-10-19 — End: 2016-12-16

## 2016-10-19 NOTE — Telephone Encounter (Signed)
Pt came into office and she wanted Josh Sheets to know that pt found her granddaughter. Per pt she just wanted provider to know.

## 2016-10-19 NOTE — Patient Instructions (Addendum)
1. Continue duloxetine 60 mg daily and 30 mg in the afternoon 2. Return to clinic in one month 3. Obtain record from neurologist

## 2016-10-21 DIAGNOSIS — M545 Low back pain: Secondary | ICD-10-CM | POA: Diagnosis not present

## 2016-10-21 DIAGNOSIS — Z79891 Long term (current) use of opiate analgesic: Secondary | ICD-10-CM | POA: Diagnosis not present

## 2016-10-21 DIAGNOSIS — R252 Cramp and spasm: Secondary | ICD-10-CM | POA: Diagnosis not present

## 2016-10-21 DIAGNOSIS — M5416 Radiculopathy, lumbar region: Secondary | ICD-10-CM | POA: Diagnosis not present

## 2016-10-21 DIAGNOSIS — F5109 Other insomnia not due to a substance or known physiological condition: Secondary | ICD-10-CM | POA: Diagnosis not present

## 2016-10-21 LAB — HEPATITIS C RNA QUANTITATIVE
HCV QUANT LOG: NOT DETECTED {Log_IU}/mL
HCV Quantitative: <15 IU/mL

## 2016-10-27 DIAGNOSIS — Z1231 Encounter for screening mammogram for malignant neoplasm of breast: Secondary | ICD-10-CM | POA: Diagnosis not present

## 2016-11-09 ENCOUNTER — Ambulatory Visit (HOSPITAL_COMMUNITY): Payer: Medicare Other | Admitting: Licensed Clinical Social Worker

## 2016-11-15 ENCOUNTER — Other Ambulatory Visit: Payer: Self-pay | Admitting: Family

## 2016-11-25 ENCOUNTER — Ambulatory Visit (HOSPITAL_COMMUNITY): Payer: Medicare Other | Admitting: Licensed Clinical Social Worker
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
## 2016-12-15 ENCOUNTER — Ambulatory Visit (HOSPITAL_COMMUNITY): Payer: Self-pay | Admitting: Licensed Clinical Social Worker

## 2016-12-16 ENCOUNTER — Encounter (HOSPITAL_COMMUNITY): Payer: Self-pay | Admitting: Licensed Clinical Social Worker

## 2016-12-16 ENCOUNTER — Ambulatory Visit (INDEPENDENT_AMBULATORY_CARE_PROVIDER_SITE_OTHER): Payer: Medicare Other | Admitting: Psychiatry

## 2016-12-16 ENCOUNTER — Encounter (HOSPITAL_COMMUNITY): Payer: Self-pay | Admitting: Psychiatry

## 2016-12-16 VITALS — BP 139/90 | HR 101 | Ht 62.0 in | Wt 165.0 lb

## 2016-12-16 DIAGNOSIS — Z87891 Personal history of nicotine dependence: Secondary | ICD-10-CM | POA: Diagnosis not present

## 2016-12-16 DIAGNOSIS — F1421 Cocaine dependence, in remission: Secondary | ICD-10-CM

## 2016-12-16 DIAGNOSIS — K219 Gastro-esophageal reflux disease without esophagitis: Secondary | ICD-10-CM

## 2016-12-16 DIAGNOSIS — F419 Anxiety disorder, unspecified: Secondary | ICD-10-CM | POA: Diagnosis not present

## 2016-12-16 DIAGNOSIS — Z634 Disappearance and death of family member: Secondary | ICD-10-CM

## 2016-12-16 DIAGNOSIS — F331 Major depressive disorder, recurrent, moderate: Secondary | ICD-10-CM | POA: Diagnosis not present

## 2016-12-16 DIAGNOSIS — Z813 Family history of other psychoactive substance abuse and dependence: Secondary | ICD-10-CM

## 2016-12-16 DIAGNOSIS — I10 Essential (primary) hypertension: Secondary | ICD-10-CM

## 2016-12-16 DIAGNOSIS — G43909 Migraine, unspecified, not intractable, without status migrainosus: Secondary | ICD-10-CM | POA: Diagnosis not present

## 2016-12-16 DIAGNOSIS — F1221 Cannabis dependence, in remission: Secondary | ICD-10-CM | POA: Diagnosis not present

## 2016-12-16 DIAGNOSIS — Z91410 Personal history of adult physical and sexual abuse: Secondary | ICD-10-CM

## 2016-12-16 DIAGNOSIS — J45909 Unspecified asthma, uncomplicated: Secondary | ICD-10-CM | POA: Diagnosis not present

## 2016-12-16 DIAGNOSIS — G40909 Epilepsy, unspecified, not intractable, without status epilepticus: Secondary | ICD-10-CM

## 2016-12-16 DIAGNOSIS — Z79899 Other long term (current) drug therapy: Secondary | ICD-10-CM

## 2016-12-16 DIAGNOSIS — G8929 Other chronic pain: Secondary | ICD-10-CM | POA: Diagnosis not present

## 2016-12-16 DIAGNOSIS — Z915 Personal history of self-harm: Secondary | ICD-10-CM

## 2016-12-16 MED ORDER — DULOXETINE HCL 30 MG PO CPEP: ORAL_CAPSULE | ORAL | 2 refills | Status: DC

## 2016-12-16 NOTE — Patient Instructions (Signed)
1. Continue duloxetine 60 mg daily and 30 mg in the afternoon 2. Return to clinic in three months  3. Obtain record from neurologist

## 2016-12-20 DIAGNOSIS — M5416 Radiculopathy, lumbar region: Secondary | ICD-10-CM | POA: Diagnosis not present

## 2016-12-20 DIAGNOSIS — F5109 Other insomnia not due to a substance or known physiological condition: Secondary | ICD-10-CM | POA: Diagnosis not present

## 2016-12-20 DIAGNOSIS — R252 Cramp and spasm: Secondary | ICD-10-CM | POA: Diagnosis not present

## 2016-12-20 DIAGNOSIS — M545 Low back pain: Secondary | ICD-10-CM | POA: Diagnosis not present

## 2016-12-20 DIAGNOSIS — Z79891 Long term (current) use of opiate analgesic: Secondary | ICD-10-CM | POA: Diagnosis not present

## 2016-12-31 ENCOUNTER — Other Ambulatory Visit: Payer: Self-pay | Admitting: Family Medicine

## 2017-01-14 ENCOUNTER — Other Ambulatory Visit: Payer: Self-pay | Admitting: Family

## 2017-01-14 DIAGNOSIS — Z9109 Other allergy status, other than to drugs and biological substances: Secondary | ICD-10-CM

## 2017-01-14 DIAGNOSIS — J4540 Moderate persistent asthma, uncomplicated: Secondary | ICD-10-CM

## 2017-01-17 ENCOUNTER — Encounter: Payer: Self-pay | Admitting: Family Medicine

## 2017-01-17 ENCOUNTER — Ambulatory Visit (INDEPENDENT_AMBULATORY_CARE_PROVIDER_SITE_OTHER): Payer: Medicare Other | Admitting: Family Medicine

## 2017-01-17 VITALS — BP 136/84 | HR 96 | Temp 97.1°F | Ht 62.0 in | Wt 171.6 lb

## 2017-01-17 DIAGNOSIS — F331 Major depressive disorder, recurrent, moderate: Secondary | ICD-10-CM | POA: Diagnosis not present

## 2017-01-17 DIAGNOSIS — G479 Sleep disorder, unspecified: Secondary | ICD-10-CM

## 2017-01-17 DIAGNOSIS — R7303 Prediabetes: Secondary | ICD-10-CM

## 2017-01-17 LAB — BAYER DCA HB A1C WAIVED: HB A1C: 6.5 % (ref <7.0)

## 2017-01-17 MED ORDER — DULOXETINE HCL 60 MG PO CPEP: 60.0000 mg | ORAL_CAPSULE | Freq: Every day | ORAL | 1 refills | Status: DC

## 2017-01-17 NOTE — Patient Instructions (Signed)
Great to see you!  Come back in 4 months to see your PCP

## 2017-01-17 NOTE — Progress Notes (Signed)
   HPI  Patient presents today here to discuss chronic medical conditions.  Depression Patient states that she had 3 no-shows at her psychiatrist and has been released. She is taking 60 mg of Cymbalta currently No SI, symptoms are controlled per her report.  Difficulty sleeping Intermittent, patient has unfavorable response to melatonin with initial sedation and then increased wakefulness.  Prediabetes Patient states that she had forgotten this, not been watching diet. Not exercising regularly   PMH: Smoking status noted ROS: Per HPI  Objective: BP 136/84   Pulse 96   Temp (!) 97.1 F (36.2 C) (Oral)   Ht 5\' 2"  (1.575 m)   Wt 171 lb 9.6 oz (77.8 kg)   BMI 31.39 kg/m  Gen: NAD, alert, cooperative with exam HEENT: NCAT CV: RRR, good S1/S2, no murmur Resp: CTABL, no wheezes, non-labored Ext: No edema, warm Neuro: Alert and oriented, No gross deficits  Assessment and plan:  #Major depressive disorder Stable, continue Cymbalta 60 mg May need to refer to new psychiatrist, it is very unfortunate that she did not keep her appointments at her previous psychiatrist  #Difficulty sleeping Mild to moderate symptoms, not responding well to melatonin Considering sedating medications I am not willing to add additional medications for sleep at this time, her symptoms do not seem to be severe enough to warrant the risk.  #Prediabetes Discussed therapeutic lifestyle changes Repeat A1c     Orders Placed This Encounter  Procedures  . Bayer DCA Hb A1c Waived    Meds ordered this encounter  Medications  . DULoxetine (CYMBALTA) 60 MG capsule    Sig: Take 1 capsule (60 mg total) daily by mouth.    Dispense:  90 capsule    Refill:  Adams, MD [facility] 01/17/2017, 5:15 PM

## 2017-01-18 ENCOUNTER — Encounter: Payer: Self-pay | Admitting: *Deleted

## 2017-01-18 ENCOUNTER — Ambulatory Visit (INDEPENDENT_AMBULATORY_CARE_PROVIDER_SITE_OTHER): Payer: Medicare Other | Admitting: *Deleted

## 2017-01-18 VITALS — BP 134/82 | HR 101 | Ht 62.0 in | Wt 169.0 lb

## 2017-01-18 DIAGNOSIS — Z0000 Encounter for general adult medical examination without abnormal findings: Secondary | ICD-10-CM

## 2017-01-18 NOTE — Patient Instructions (Signed)
  Teresa [name] , Thank you for taking time to come for your Medicare Wellness Visit. I appreciate your ongoing commitment to your health goals. Please review the following plan we discussed and let me know if I can assist you in the future.   These are the goals we discussed: Increase physical activity. Try to walk for 30 minutes a day.   This is a list of the screening recommended for you and due dates:  Health Maintenance  Topic Date Due  . Colon Cancer Screening  09/12/2016  . Flu Shot  06/12/2017*  . Pap Smear  05/01/2018  . Mammogram  10/28/2018  . Tetanus Vaccine  01/26/2026  .  Hepatitis C: One time screening is recommended by Center for Disease Control  (CDC) for  adults born from [birth year] through 1963.   Completed  . HIV Screening  Completed  *Topic was postponed. The date shown is not the original due date.

## 2017-01-18 NOTE — Progress Notes (Signed)
Subjective:   Teresa [name] is a 61 y.o. female who presents for an Initial Medicare Annual Wellness Visit. Teresa [name] is divorced and lives in an apartment. Her 85 year old granddaughter lives with her and is unemployed. Teresa [name] has one adult daughter and multiple grandchildren that she has helped take care of. She is unemployed due to disability. She receives food stamps and a disability check but her granddaughter doesn't qualify for food stamps so they sometimes have difficulty making the food stamps feed them both. She did visit a food pantry at a [employer] last week.   Review of Systems    Health is about the same as last year.   Cardiac Risk Factors include: hypertension;sedentary lifestyle  Other systems negative.     Objective:    Today's Vitals   01/18/17 1427  BP: 134/82  Pulse: (!) 101  Weight: 169 lb (76.7 kg)  Height: 5\' 2"  (1.575 m)   Body mass index is 30.91 kg/m.   Current Medications (verified) Outpatient Encounter Medications as of 01/18/2017  Medication Sig  . budesonide-formoterol (SYMBICORT) 160-4.5 MCG/ACT inhaler Inhale 2 puffs into the lungs 2 (two) times daily.  . cetirizine (ZYRTEC) 10 MG tablet TAKE ONE TABLET EVERY MORNING  . cyclobenzaprine (FLEXERIL) 10 MG tablet 2 (two) times daily after a meal.   . DEXILANT 60 MG capsule TAKE ONE (1) CAPSULE EACH DAY  . diazepam (VALIUM) 5 MG tablet Take 5 mg by mouth daily.   Marland Kitchen docusate sodium (COLACE) 100 MG capsule Take 100-200 mg by mouth daily as needed for mild constipation.   . DULoxetine (CYMBALTA) 60 MG capsule Take 1 capsule (60 mg total) daily by mouth.  . fluticasone (FLONASE) 50 MCG/ACT nasal spray USE 2 SPRAYS IN EACH NOSTRIL DAILY  . lisinopril (PRINIVIL,ZESTRIL) 10 MG tablet TAKE ONE (1) TABLET EACH DAY  . MAGNESIUM PO Take 1 tablet by mouth daily. Reported on 07/10/2015  . mometasone (NASONEX) 50 MCG/ACT nasal spray USE 2 SPRAYS IN EACH NOSTRIL ONCE DAILY  . montelukast (SINGULAIR) 10 MG  tablet TAKE ONE (1) TABLET EACH DAY  . Olopatadine HCl 0.2 % SOLN Place 1 drop into both eyes daily as needed for irritation.  Marland Kitchen oxymorphone (OPANA) 10 MG tablet Take 10 mg by mouth 2 (two) times daily before a meal.  . polyethylene glycol powder (GLYCOLAX/MIRALAX) powder MIX 17 GRAMS INTO 8OZ OF WATER AND DRINK DAILY  . PROAIR HFA 108 (90 Base) MCG/ACT inhaler INHALE 2 PUFFS EVERY 4 HOURS AS NEEDED  . Probiotic Product (PROBIOTIC DAILY PO) Take by mouth.  . promethazine (PHENERGAN) 25 MG tablet Take 25 mg by mouth as needed for nausea.    No facility-administered encounter medications on file as of 01/18/2017.     Allergies (verified) Lyrica [pregabalin]; Trazodone and nefazodone; Amitriptyline; Arthrotec [diclofenac-misoprostol]; Keflex [cephalexin]; Naproxen; Prozac [fluoxetine hcl]; and Ambien [zolpidem tartrate]   History: Past Medical History:  Diagnosis Date  . Anxiety state, unspecified   . Asthma    prn inhaler  . Chronic back pain greater than 3 months duration   . Colon polyp   . Degeneration of intervertebral disc, site unspecified   . Depressive disorder, not elsewhere classified   . Diverticulitis   . Diverticulosis 07-08-2005   colonoscopy  . Esophageal stricture 2014  . Full dentures   . GERD (gastroesophageal reflux disease) 07-08-2005   EGD  . Hiatal hernia 07-08-2005   EGD  . Insomnia   . Irritable bowel syndrome   .  Migraine   . Muscle spasm   . Osteoarthrosis, unspecified whether generalized or localized, lower leg   . Papilloma of breast 09/2011   left  . Seasonal allergies   . Seizures ([place]) 1990s   x 1, unknown cause; no seizures since  . Vocal cord polyp    history of   Past Surgical History:  Procedure Laterality Date  . ABDOMINAL HYSTERECTOMY     partial  . BILATERAL SALPINGOOPHORECTOMY    . BLADDER SURGERY     bladder tack  . BREAST BIOPSY  11/03/2011   Procedure: BREAST BIOPSY WITH NEEDLE LOCALIZATION;  Surgeon: Harl Bowie, MD;   Location: [facility];  Service: General;  Laterality: Left;  needle localized left breast biopsy  . CHOLECYSTECTOMY    . COLONOSCOPY  07/08/2005   562.10  . CYSTOSCOPY  08/24/2011   Procedure: CYSTOSCOPY;  Surgeon: Reece Packer, MD;  [facility];  Service: Urology;  Laterality: N/A;  Cystoscopy,  Rectocele Repair and Vault Prolapse Repair  . ESOPHAGOGASTRODUODENOSCOPY  10/03/2012   multiple   . OTHER SURGICAL HISTORY     exc. vocal cord polyp  . RECTAL TUMOR BY PROCTOTOMY EXCISION    . RECTOCELE REPAIR  08/24/2011   Procedure: POSTERIOR REPAIR (RECTOCELE);  Surgeon: Reece Packer, MD;  [facility];  Service: Urology;  Laterality: N/A;  . rectocelle repair    . TUMOR REMOVAL     benign  . VAGINAL PROLAPSE REPAIR  08/24/2011   Procedure: VAGINAL VAULT SUSPENSION;  Surgeon: Reece Packer, MD;  [facility];  Service: Urology;  Laterality: N/A;   Family History  Problem Relation Age of Onset  . Prostate cancer Father   . Sleep apnea Father   . Stomach cancer Maternal Uncle   . Diabetes Maternal Grandmother   . Stroke Maternal Grandmother   . Heart disease Paternal Grandmother   . Irritable bowel syndrome Unknown   . Stroke Maternal Grandfather    Social History   Occupational History  . Occupation: Disabled  Tobacco Use  . Smoking status: Former Smoker    Last attempt to quit: 08/20/1991    Years since quitting: 25.4  . Smokeless tobacco: Never Used  Substance and Sexual Activity  . Alcohol use: No    Alcohol/week: 0.0 oz    Comment: quit 23 yrs ago, 08-24-2016 per pt no  . Drug use: Yes    Comment: quit 23 yrs ago. Cocaine and marijuana, 08-24-2016 per pt 23 yrs ago  . Sexual activity: No    Tobacco Counseling No tobacco use  Activities of Daily Living In your present state of health, do you have any difficulty performing the following activities: 01/18/2017  Hearing? Y  Comment has noticed some difficulty with hearing recently   Vision? N  Difficulty concentrating or making decisions? Y  Walking or climbing stairs? Y  Dressing or bathing? N  Doing errands, shopping? N  Preparing Food and eating ? N  Using the Toilet? N  In the past six months, have you accidently leaked urine? N  Do you have problems with loss of bowel control? N  Managing your Medications? N  Managing your Finances? N  Housekeeping or managing your Housekeeping? N  Some recent data might be hidden    Immunizations and Health Maintenance Immunization History  Administered Date(s) Administered  . Influenza,inj,Quad PF,6+ Mos 12/19/2013, 11/28/2014, 01/27/2016  . Tdap 12/07/2011, 01/27/2016  . Zoster 10/17/2014   Health Maintenance Due  Topic Date Due  . COLONOSCOPY  09/12/2016    Patient Care Team: Sharion Balloon, FNP as PCP - General (Family Medicine) Scarlette Shorts, MD as consulting physician- gastroenterology  No hospitalizations, ER visits, or surgeries this past year.      Assessment:   This is a routine wellness examination for Teresa [name].   Hearing/Vision screen No deficits noted during visit.   Dietary issues and exercise activities discussed: Current Exercise Habits: The patient does not participate in regular exercise at present, Exercise limited by: None identified  Goals Walk for 30 min a day  Depression Screen PHQ 2/9 Scores 01/18/2017 01/17/2017 08/19/2016 06/17/2016 05/17/2016 03/04/2016 01/27/2016  PHQ - 2 Score 1 1 1 3 4 1 2   PHQ- 9 Score - - - 9 22 - 9    Fall Risk Fall Risk  01/18/2017 08/19/2016 06/17/2016 05/17/2016 03/04/2016  Falls in the past year? No No No No No    Cognitive Function: MMSE - Mini Mental State Exam 01/18/2017  Orientation to time 5  Orientation to Place 5  Registration 3  Attention/ Calculation 5  Recall 3  Language- name 2 objects 2  Language- repeat 1  Language- follow 3 step command 3  Language- read & follow direction 1  Write a sentence 1  Copy design 1  Total score 30          Screening Tests Health Maintenance  Topic Date Due  . COLONOSCOPY  09/12/2016  . INFLUENZA VACCINE  06/12/2017 (Originally 09/29/2016)  . PAP SMEAR  05/01/2018  . MAMMOGRAM  10/28/2018  . TETANUS/TDAP  01/26/2026  . Hepatitis C Screening  Completed  . HIV Screening  Completed  Dr Blanch Media office has contacted her to schedule a colonoscopy. She needs to call them back.     Plan:   Walk for 30 min a day Handout given with food pantries and soup kitchens in the county Keep f/u with PCP Schedule Colonoscopy with Dr Henrene Pastor.   I have personally reviewed and noted the following in the patient's chart:   . Medical and social history . Use of alcohol, tobacco or illicit drugs  . Current medications and supplements . Functional ability and status . Nutritional status . Physical activity . Advanced directives . List of other physicians . Hospitalizations, surgeries, and ER visits in previous 12 months . Vitals . Screenings to include cognitive, depression, and falls . Referrals and appointments  In addition, I have reviewed and discussed with patient certain preventive protocols, quality metrics, and best practice recommendations. A written personalized care plan for preventive services as well as general preventive health recommendations were provided to patient.     Chong Sicilian, RN   01/18/2017   I have reviewed and agree with the above AWV documentation.   Evelina Dun, FNP

## 2017-02-17 DIAGNOSIS — Z79891 Long term (current) use of opiate analgesic: Secondary | ICD-10-CM | POA: Diagnosis not present

## 2017-02-17 DIAGNOSIS — M545 Low back pain: Secondary | ICD-10-CM | POA: Diagnosis not present

## 2017-03-03 ENCOUNTER — Other Ambulatory Visit: Payer: Self-pay | Admitting: Family

## 2017-03-03 DIAGNOSIS — I10 Essential (primary) hypertension: Secondary | ICD-10-CM
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
## 2017-03-17 ENCOUNTER — Ambulatory Visit (HOSPITAL_COMMUNITY): Payer: Self-pay | Admitting: Psychiatry

## 2017-03-30 DIAGNOSIS — J209 Acute bronchitis, unspecified: Secondary | ICD-10-CM | POA: Diagnosis not present

## 2017-03-30 DIAGNOSIS — Z87891 Personal history of nicotine dependence: Secondary | ICD-10-CM | POA: Diagnosis not present

## 2017-03-30 DIAGNOSIS — Z79899 Other long term (current) drug therapy: Secondary | ICD-10-CM | POA: Diagnosis not present

## 2017-03-30 DIAGNOSIS — R509 Fever, unspecified: Secondary | ICD-10-CM | POA: Diagnosis not present

## 2017-03-30 DIAGNOSIS — R05 Cough: Secondary | ICD-10-CM | POA: Diagnosis not present

## 2017-03-30 DIAGNOSIS — K219 Gastro-esophageal reflux disease without esophagitis: Secondary | ICD-10-CM | POA: Diagnosis not present

## 2017-04-01 ENCOUNTER — Other Ambulatory Visit: Payer: Self-pay | Admitting: Family Medicine

## 2017-04-19 ENCOUNTER — Ambulatory Visit: Payer: Medicare Other | Admitting: Family Medicine

## 2017-04-19 DIAGNOSIS — Z79891 Long term (current) use of opiate analgesic: Secondary | ICD-10-CM | POA: Diagnosis not present

## 2017-04-19 DIAGNOSIS — M545 Low back pain: Secondary | ICD-10-CM | POA: Diagnosis not present

## 2017-04-19 DIAGNOSIS — R569 Unspecified convulsions: Secondary | ICD-10-CM | POA: Diagnosis not present

## 2017-04-20 ENCOUNTER — Ambulatory Visit: Payer: Medicare Other | Admitting: Family Medicine

## 2017-04-21 ENCOUNTER — Other Ambulatory Visit: Payer: Self-pay | Admitting: Family

## 2017-04-21 DIAGNOSIS — J4540 Moderate persistent asthma, uncomplicated: Secondary | ICD-10-CM

## 2017-04-21 DIAGNOSIS — I10 Essential (primary) hypertension: Secondary | ICD-10-CM

## 2017-04-22 MED ORDER — ALBUTEROL SULFATE HFA 108 (90 BASE) MCG/ACT IN AERS: 2.0000 | INHALATION_SPRAY | RESPIRATORY_TRACT | 0 refills | Status: DC | PRN

## 2017-04-22 NOTE — Addendum Note (Signed)
Addended by: Antonietta Barcelona D on: 04/22/2017 08:41 AM   Modules accepted: Orders

## 2017-04-25 ENCOUNTER — Encounter: Payer: Self-pay | Admitting: Family Medicine

## 2017-04-25 ENCOUNTER — Ambulatory Visit (INDEPENDENT_AMBULATORY_CARE_PROVIDER_SITE_OTHER): Payer: Medicare Other | Admitting: Family Medicine

## 2017-04-25 VITALS — BP 121/78 | HR 109 | Temp 97.3°F | Ht 62.0 in | Wt 168.6 lb

## 2017-04-25 DIAGNOSIS — F329 Major depressive disorder, single episode, unspecified: Secondary | ICD-10-CM | POA: Diagnosis not present

## 2017-04-25 DIAGNOSIS — F32A Depression, unspecified: Secondary | ICD-10-CM

## 2017-04-25 DIAGNOSIS — R569 Unspecified convulsions: Secondary | ICD-10-CM | POA: Diagnosis not present

## 2017-04-25 DIAGNOSIS — I10 Essential (primary) hypertension: Secondary | ICD-10-CM | POA: Diagnosis not present

## 2017-04-25 NOTE — Progress Notes (Signed)
   HPI  Patient presents today here to follow-up for chronic medical conditions  Depression Patient states that she did not like the way Cymbalta made her feel, she is also concerned about weight gain.  She decreased her medication to 30 mg and 30 mg every other day for about the last 2 weeks. Like to stop Cymbalta.  She has established with psychiatry has already had 3 no-shows and has been released. Neurology is prescribing her Valium.  Patient describes a recent seizure-like episode, she is already been seen by neurology and states that an EEG is pending. No additional episodes since this weekend. She has an unusual story of being at home and in a deep sleep, she states that her daughter "had a bad feeling" and then came to check on her.  She was difficult to arouse from knocking on the door but did make it to the door to unlock it. She states that she was then not quite like herself. She states that on another occasion she woke up with the bed shaking which was observed by other people.  PMH: Smoking status noted ROS: Per HPI  Objective: BP 121/78   Pulse (!) 109   Temp (!) 97.3 F (36.3 C) (Oral)   Ht 5\' 2"  (1.575 m)   Wt 168 lb 9.6 oz (76.5 kg)   BMI 30.84 kg/m  Gen: NAD, alert, cooperative with exam HEENT: NCAT CV: RRR, good S1/S2, no murmur Resp: CTABL, no wheezes, non-labored Ext: No edema, warm Neuro: Alert and oriented, No gross deficits  Assessment and plan:  #Hypertension Well-controlled Labs up-to-date Continue lisinopril  #Depression Patient would like to stop Cymbalta, it has certainly been a challenge to find a medication that would work for her with no side effects. Discontinue, she is already taking 30 mg every other day and tolerating that well for 2 weeks. Recommended [facility]  #Seizure-like activity Considering patient's significant mental health difficulties these could definitely be pseudoseizures, she has been working with  neurology and has an EEG scheduled.    Laroy Apple, MD [facility] 04/25/2017, 10:15 AM

## 2017-04-25 NOTE — Patient Instructions (Signed)
Great to see you!  Come back in 6 4 months unless you need [place] sooner.

## 2017-05-19 DIAGNOSIS — R569 Unspecified convulsions: Secondary | ICD-10-CM | POA: Diagnosis not present

## 2017-05-27 ENCOUNTER — Ambulatory Visit: Payer: Medicare Other | Admitting: Family Medicine

## 2017-05-27 ENCOUNTER — Telehealth: Payer: Self-pay | Admitting: Family Medicine

## 2017-05-27 NOTE — Telephone Encounter (Signed)
Nursing has called, she will reschedule for next week.   Laroy Apple, MD [facility] 05/27/2017, 5:24 PM

## 2017-05-30 ENCOUNTER — Encounter: Payer: Self-pay | Admitting: Family Medicine

## 2017-05-30 ENCOUNTER — Ambulatory Visit: Payer: Medicare Other | Admitting: Family Medicine

## 2017-05-30 ENCOUNTER — Ambulatory Visit (INDEPENDENT_AMBULATORY_CARE_PROVIDER_SITE_OTHER): Payer: Medicare Other | Admitting: Family Medicine

## 2017-05-30 VITALS — BP 121/76 | HR 89 | Temp 96.9°F | Ht 62.0 in | Wt 163.2 lb

## 2017-05-30 DIAGNOSIS — F329 Major depressive disorder, single episode, unspecified: Secondary | ICD-10-CM | POA: Diagnosis not present

## 2017-05-30 DIAGNOSIS — Z86010 Personal history of colonic polyps: Secondary | ICD-10-CM | POA: Diagnosis not present

## 2017-05-30 DIAGNOSIS — F32A Depression, unspecified: Secondary | ICD-10-CM

## 2017-05-30 MED ORDER — MIRTAZAPINE 15 MG PO TABS: 15.0000 mg | ORAL_TABLET | Freq: Every day | ORAL | 3 refills | Status: DC

## 2017-05-30 NOTE — Patient Instructions (Signed)
Great to see you!  Come back in 3-4 weeks to see how you are doing on mirtazapine, start 1 pill once daily at night .

## 2017-05-30 NOTE — Progress Notes (Signed)
   HPI  Patient presents today here with depression.  Patient has complex past psychological history with history of generalized anxiety disorder, depression, alcohol abuse in remission and drug abuse now in remission.  Patient has been seen by behavioral health but then missed 3 appointments and was released from the practice.  She has stopped Cymbalta about 1 month ago and states that her depressive symptoms have returned over the last week or 2. She notes multiple crying episodes and irritability.  She also states that it is difficult to wind down and get to sleep at night.  He was concerned about weight gain with Cymbalta.    PMH: Smoking status noted ROS: Per HPI  Objective: BP 121/76   Pulse 89   Temp (!) 96.9 F (36.1 C) (Oral)   Ht 5\' 2"  (1.575 m)   Wt 163 lb 3.2 oz (74 kg)   BMI 29.85 kg/m  Gen: NAD, alert, cooperative with exam HEENT: NCAT CV: RRR, good S1/S2, no murmur Resp: CTABL, no wheezes, non-labored Ext: No edema, warm Neuro: Alert and oriented, No gross deficits  Depression screen [facility] 2/9 05/30/2017 04/25/2017 01/18/2017  Decreased Interest 2 0 0  Down, Depressed, Hopeless 2 0 1  PHQ - 2 Score 4 0 1  Altered sleeping 3 - -  Tired, decreased energy 2 - -  Change in appetite 2 - -  Feeling bad or failure about yourself  2 - -  Trouble concentrating 2 - -  Moving slowly or fidgety/restless 2 - -  Suicidal thoughts 0 - -  PHQ-9 Score 17 - -  Difficult doing work/chores - - -  Some recent data might be hidden     Assessment and plan:  #Depression Started mirtazapine 1 pill once nightly, follow-up in 3-4 weeks.     Meds ordered this encounter  Medications  . mirtazapine (REMERON) 15 MG tablet    Sig: Take 1 tablet (15 mg total) by mouth at bedtime.    Dispense:  30 tablet    Refill:  [place], MD [facility] 05/30/2017, 9:30 AM   She comes back stating that she has a history of colon polyps and needs a  referral back to GI.  Review of records shows polyps x7 4 years ago, recommended repeat in 3 years, referral placed.  Laroy Apple, MD [facility] 05/30/2017, 9:39 AM

## 2017-06-06 DIAGNOSIS — M545 Low back pain: Secondary | ICD-10-CM | POA: Diagnosis not present

## 2017-06-06 DIAGNOSIS — Z79891 Long term (current) use of opiate analgesic: Secondary | ICD-10-CM | POA: Diagnosis not present

## 2017-06-07 ENCOUNTER — Telehealth: Payer: Self-pay | Admitting: Internal Medicine

## 2017-06-07 NOTE — Telephone Encounter (Signed)
Teresa [name] was advised to contact her provider at [facility] and see if they are able to do her procedure there since that is her preference. If for some reason they are unable to perform the procedure we can ask Dr Henrene Pastor. She will call back.

## 2017-06-15 DIAGNOSIS — Z79891 Long term (current) use of opiate analgesic: Secondary | ICD-10-CM | POA: Diagnosis not present

## 2017-06-15 DIAGNOSIS — M545 Low back pain: Secondary | ICD-10-CM | POA: Diagnosis not present

## 2017-06-23 ENCOUNTER — Ambulatory Visit (HOSPITAL_COMMUNITY)
Admission: RE | Admit: 2017-06-23 | Discharge: 2017-06-23 | Disposition: A | Discharge status: Home / Self Care | Payer: Medicare Other | Source: Ambulatory Visit | Attending: Neurology | Admitting: Neurology

## 2017-06-23 ENCOUNTER — Other Ambulatory Visit (HOSPITAL_COMMUNITY): Payer: Self-pay | Admitting: Neurology

## 2017-06-23 DIAGNOSIS — M47816 Spondylosis without myelopathy or radiculopathy, lumbar region: Secondary | ICD-10-CM | POA: Diagnosis not present

## 2017-06-23 DIAGNOSIS — R52 Pain, unspecified: Secondary | ICD-10-CM | POA: Diagnosis not present

## 2017-06-23 DIAGNOSIS — M5412 Radiculopathy, cervical region: Secondary | ICD-10-CM | POA: Diagnosis not present

## 2017-06-23 DIAGNOSIS — G603 Idiopathic progressive neuropathy: Secondary | ICD-10-CM | POA: Diagnosis not present

## 2017-06-28 ENCOUNTER — Ambulatory Visit (INDEPENDENT_AMBULATORY_CARE_PROVIDER_SITE_OTHER): Payer: Medicare Other | Admitting: Family Medicine

## 2017-06-28 ENCOUNTER — Other Ambulatory Visit: Payer: Self-pay | Admitting: Family

## 2017-06-28 ENCOUNTER — Encounter: Payer: Self-pay | Admitting: Family Medicine

## 2017-06-28 VITALS — BP 125/77 | HR 114 | Temp 97.3°F | Ht 62.0 in | Wt 163.8 lb

## 2017-06-28 DIAGNOSIS — J4540 Moderate persistent asthma, uncomplicated: Secondary | ICD-10-CM | POA: Diagnosis not present

## 2017-06-28 DIAGNOSIS — Z23 Encounter for immunization: Secondary | ICD-10-CM

## 2017-06-28 DIAGNOSIS — R7303 Prediabetes: Secondary | ICD-10-CM | POA: Diagnosis not present

## 2017-06-28 DIAGNOSIS — F32A Depression, unspecified: Secondary | ICD-10-CM

## 2017-06-28 DIAGNOSIS — F329 Major depressive disorder, single episode, unspecified: Secondary | ICD-10-CM

## 2017-06-28 LAB — BAYER DCA HB A1C WAIVED: HB A1C (BAYER DCA - WAIVED): 6.6 % (ref <7.0)

## 2017-06-28 MED ORDER — MIRTAZAPINE 30 MG PO TABS: 30.0000 mg | ORAL_TABLET | Freq: Every day | ORAL | 1 refills | Status: DC

## 2017-06-28 NOTE — Patient Instructions (Signed)
Teresa [name] to see you!  Come back in 2 month sunless you need [place] sooner.

## 2017-06-28 NOTE — Progress Notes (Addendum)
   HPI  Patient presents today here to follow-up for depression.  Patient also reports history of COPD, she has documented history of asthma. Breathing is at baseline, she would like a Pneumovax.  Depression Tolerating Remeron well, would like to increase the dosage. States that it does seem effective. No SI  Prediabetes Discussed, patient states that she is not watching her diet very carefully, however she is aware of prediabetes.   PMH: Smoking status noted ROS: Per HPI  Objective: BP 125/77   Pulse (!) 114   Temp (!) 97.3 F (36.3 C) (Oral)   Ht 5\' 2"  (1.575 m)   Wt 163 lb 12.8 oz (74.3 kg)   BMI 29.96 kg/m  Gen: NAD, alert, cooperative with exam HEENT: NCAT, CV: RRR, good S1/S2, no murmur Resp: CTABL, no wheezes, non-labored Ext: No edema, warm Neuro: Alert and oriented, No gross deficits  Depression screen [facility] 2/9 06/28/2017 05/30/2017 04/25/2017 01/18/2017 01/17/2017  Decreased Interest 1 2 0 0 0  Down, Depressed, Hopeless 1 2 0 1 1  PHQ - 2 Score 2 4 0 1 1  Altered sleeping 2 3 - - -  Tired, decreased energy 1 2 - - -  Change in appetite 0 2 - - -  Feeling bad or failure about yourself  0 2 - - -  Trouble concentrating 0 2 - - -  Moving slowly or fidgety/restless 0 2 - - -  Suicidal thoughts 0 0 - - -  PHQ-9 Score 5 17 - - -  Difficult doing work/chores - - - - -  Some recent data might be hidden     Assessment and plan:  #Depression Improving, titrate Remeron to 30 mg  #Asthma Patient reports COPD, however doing well with inhalers Pneumovax given today  #Prediabetes Previous A1c was 6.5, technically diabetes, repeat labs  Counseling provided for all vaccines and vaccine components   Orders Placed This Encounter  Procedures  . Bayer DCA Hb A1c Waived    Meds ordered this encounter  Medications  . mirtazapine (REMERON) 30 MG tablet    Sig: Take 1 tablet (30 mg total) by mouth at bedtime.    Dispense:  90 tablet    Refill:  Max, MD [facility] 06/28/2017, 4:18 PM

## 2017-06-30 ENCOUNTER — Encounter (INDEPENDENT_AMBULATORY_CARE_PROVIDER_SITE_OTHER): Payer: Self-pay | Admitting: *Deleted

## 2017-07-02 ENCOUNTER — Other Ambulatory Visit: Payer: Self-pay | Admitting: Family

## 2017-07-02 DIAGNOSIS — J4540 Moderate persistent asthma, uncomplicated: Secondary | ICD-10-CM

## 2017-07-04 ENCOUNTER — Telehealth: Payer: Self-pay | Admitting: Family Medicine

## 2017-07-04 NOTE — Telephone Encounter (Signed)
Needs to be seen for diverticulitis.   Laroy Apple, MD [facility] 07/04/2017, 3:16 PM

## 2017-07-04 NOTE — Telephone Encounter (Signed)
Patient aware.  Appt made  

## 2017-07-05 ENCOUNTER — Encounter: Payer: Self-pay | Admitting: Family Medicine

## 2017-07-05 ENCOUNTER — Ambulatory Visit (INDEPENDENT_AMBULATORY_CARE_PROVIDER_SITE_OTHER): Payer: Medicare Other | Admitting: Family Medicine

## 2017-07-05 VITALS — BP 139/88 | HR 94 | Temp 97.4°F | Ht 62.0 in | Wt 163.0 lb

## 2017-07-05 DIAGNOSIS — R1032 Left lower quadrant pain: Secondary | ICD-10-CM | POA: Diagnosis not present

## 2017-07-05 MED ORDER — METRONIDAZOLE 500 MG PO TABS: 500.0000 mg | ORAL_TABLET | Freq: Three times a day (TID) | ORAL | 0 refills | Status: DC

## 2017-07-05 MED ORDER — AMOXICILLIN-POT CLAVULANATE 875-125 MG PO TABS: 1.0000 | ORAL_TABLET | Freq: Two times a day (BID) | ORAL | 0 refills | Status: AC

## 2017-07-05 NOTE — Patient Instructions (Signed)

## 2017-07-05 NOTE — Progress Notes (Signed)
Subjective: CC: LLQ abdominal pain PCP: Timmothy Euler, MD [id number] Teresa [name] is a 62 y.o. female presenting to clinic today for:  1. LLQ abdominal pain Patient reports a 3-day history of left lower quadrant abdominal pain.  She notes that it became much worse yesterday.  She reports it continues to hurt today.  She notes normal bowel movements at home and states that they are soft stools.  She denies any hematochezia, melena, fevers, chills.  She notes chronic nausea that has been slightly worse over the last few days.  This is relieved by Zofran.  Denies any vomiting.  She is tolerating oral intake without difficulty.  She has a past medical history of IBS and diverticulosis that has been diverticulitis several times over the last several years.  She sees a gastroenterologist and to have pathologist.  Last colonoscopy per her report with polyps that were removed.  She notes that she is on a daily stool softener and intermittently uses GlycoLax.   ROS: Per HPI  Allergies  Allergen Reactions  . Lyrica [Pregabalin] Shortness Of Breath  . Trazodone And Nefazodone Shortness Of Breath  . Amitriptyline   . Arthrotec [Diclofenac-Misoprostol] Other (See Comments)    Upset stomach  . Keflex [Cephalexin] Other (See Comments)    Unknown reaction  . Naproxen Other (See Comments)    Irritates stomach  . Prozac [Fluoxetine Hcl] Other (See Comments)    "makes me feel bad"  . Ambien [Zolpidem Tartrate] Other (See Comments)    COULDN'T FUNCTION, STAYED SLEEPY   Past Medical History:  Diagnosis Date  . Anxiety state, unspecified   . Asthma    prn inhaler  . Chronic back pain greater than 3 months duration   . Colon polyp   . Degeneration of intervertebral disc, site unspecified   . Depressive disorder, not elsewhere classified   . Diverticulitis   . Diverticulosis 07-08-2005   colonoscopy  . Esophageal stricture 2014  . Full dentures   . GERD (gastroesophageal reflux disease) 07-08-2005    EGD  . Hiatal hernia 07-08-2005   EGD  . Insomnia   . Irritable bowel syndrome   . Migraine   . Muscle spasm   . Osteoarthrosis, unspecified whether generalized or localized, lower leg   . Papilloma of breast 09/2011   left  . Seasonal allergies   . Seizures ([place]) 1990s   x 1, unknown cause; no seizures since  . Vocal cord polyp    history of    Current Outpatient Medications:  .  albuterol (PROAIR HFA) 108 (90 Base) MCG/ACT inhaler, Inhale 2 puffs into the lungs every 4 (four) hours as needed., Disp: 25.5 g, Rfl: 0 .  buprenorphine (BUTRANS - DOSED MCG/HR) 10 MCG/HR PTWK patch, , Disp: , Rfl:  .  cetirizine (ZYRTEC) 10 MG tablet, TAKE ONE TABLET EVERY MORNING, Disp: 30 tablet, Rfl: 11 .  cyclobenzaprine (FLEXERIL) 10 MG tablet, 2 (two) times daily after a meal. , Disp: , Rfl:  .  DEXILANT 60 MG capsule, TAKE ONE (1) CAPSULE EACH DAY, Disp: 90 capsule, Rfl: 1 .  diazepam (VALIUM) 5 MG tablet, Take 5 mg by mouth daily. , Disp: , Rfl:  .  docusate sodium (COLACE) 100 MG capsule, Take 100-200 mg by mouth daily as needed for mild constipation. , Disp: , Rfl:  .  lisinopril (PRINIVIL,ZESTRIL) 10 MG tablet, TAKE ONE (1) TABLET EACH DAY, Disp: 90 tablet, Rfl: 1 .  mirtazapine (REMERON) 30 MG tablet, Take 1 tablet (  30 mg total) by mouth at bedtime., Disp: 90 tablet, Rfl: 1 .  mometasone (NASONEX) 50 MCG/ACT nasal spray, USE 2 SPRAYS IN EACH NOSTRIL ONCE DAILY, Disp: 51 g, Rfl: 3 .  montelukast (SINGULAIR) 10 MG tablet, TAKE ONE (1) TABLET EACH DAY, Disp: 90 tablet, Rfl: 2 .  Olopatadine HCl 0.2 % SOLN, Place 1 drop into both eyes daily as needed for irritation., Disp: , Rfl:  .  ondansetron (ZOFRAN) 4 MG tablet, , Disp: , Rfl:  .  oxymorphone (OPANA) 5 MG tablet, Take 1 tablet by mouth daily., Disp: , Rfl:  .  polyethylene glycol powder (GLYCOLAX/MIRALAX) powder, MIX 17 GRAMS INTO 8OZ OF WATER AND DRINK DAILY, Disp: 527 g, Rfl: 0 .  Probiotic Product (PROBIOTIC DAILY PO), Take by mouth.,  Disp: , Rfl:  .  SYMBICORT 160-4.5 MCG/ACT inhaler, USE 2 PUFFS TWICE DAILY, Disp: 10.2 g, Rfl: 2 Social History   Socioeconomic History  . Marital status: Divorced    Spouse name: Not on file  . Number of children: 1  . Years of education: Not on file  . Highest education level: Not on file  Occupational History  . Occupation: Disabled  Social Needs  . Financial resource strain: Somewhat hard  . Food insecurity:    Worry: Sometimes true    Inability: Sometimes true  . Transportation needs:    Medical: No    Non-medical: No  Tobacco Use  . Smoking status: Former Smoker    Last attempt to quit: 08/20/1991    Years since quitting: 25.8  . Smokeless tobacco: Never Used  Substance and Sexual Activity  . Alcohol use: No    Alcohol/week: 0.0 oz    Comment: quit 23 yrs ago, 08-24-2016 per pt no  . Drug use: Yes    Comment: quit 23 yrs ago. Cocaine and marijuana, 08-24-2016 per pt 23 yrs ago  . Sexual activity: Never  Lifestyle  . Physical activity:    Days per week: 0 days    Minutes per session: Not on file  . Stress: Only a little  Relationships  . Social connections:    Talks on phone: More than three times a week    Gets together: More than three times a week    Attends religious service: More than 4 times per year    Active member of club or organization: Yes    Attends meetings of clubs or organizations: More than 4 times per year    Relationship status: Divorced  . Intimate partner violence:    Fear of current or ex partner: No    Emotionally abused: No    Physically abused: No    Forced sexual activity: No  Other Topics Concern  . Not on file  Social History Narrative   Lives in an apartment. Her 50 year old granddaughter lives with her. Her granddaughter does not work and sometimes it is difficulty to stretch her food stamps out to feed both of them.   Family History  Problem Relation Age of Onset  . Prostate cancer Father   . Sleep apnea Father   . Stomach  cancer Maternal Uncle   . Diabetes Maternal Grandmother   . Stroke Maternal Grandmother   . Heart disease Paternal Grandmother   . Irritable bowel syndrome Unknown   . Stroke Maternal Grandfather     Objective: Office vital signs reviewed. BP 139/88   Pulse 94   Temp (!) 97.4 F (36.3 C) (Oral)   Ht 5'  2" (1.575 m)   Wt 163 lb (73.9 kg)   BMI 29.81 kg/m   Physical Examination:  General: Awake, alert, well nourished, nontoxic appearing, No acute distress HEENT: sclera white, MMM Pulm:  normal work of breathing on room air GI: soft, + TTP in LLQ, non-distended, bowel sounds hyperactive x4, no hepatomegaly, no splenomegaly, no masses Psych: affect flat, mood stable  Assessment/ Plan: 62 y.o. female   1. LLQ abdominal pain Patient is afebrile nontoxic-appearing with normal vital signs.  Her abdominal exam was remarkable for tenderness to palpation left lower quadrant.  Patient notes that this is what her typical diverticulitis episodes are like.  We discussed that other than her left lower quadrant pain, her picture is not typical of a diverticulitis flare.  She wishes to proceed with antibiotics.  We discussed the risks and benefits of this.  She accepts these risks.  Augmentin 875 p.o. twice daily and Flagyl 500mg  3 times daily sent in for the next 7 days.  Home care instructions reviewed.  Handout provided.  Reasons for return and emergent evaluation in the emergency department discussed.  Patient voiced good understanding.  If symptoms are not improving with above measures, she will contact her gastroenterologist for further instructions.  Meds ordered this encounter  Medications  . amoxicillin-clavulanate (AUGMENTIN) 875-125 MG tablet    Sig: Take 1 tablet by mouth 2 (two) times daily for 7 days.    Dispense:  14 tablet    Refill:  0  . metroNIDAZOLE (FLAGYL) 500 MG tablet    Sig: Take 1 tablet (500 mg total) by mouth 3 (three) times daily.    Dispense:  21 tablet    Refill:   [place], DO [address] [phone]

## 2017-07-06 DIAGNOSIS — Z79891 Long term (current) use of opiate analgesic: Secondary | ICD-10-CM | POA: Diagnosis not present

## 2017-07-06 DIAGNOSIS — M545 Low back pain: Secondary | ICD-10-CM | POA: Diagnosis not present

## 2017-07-07 DIAGNOSIS — H524 Presbyopia: Secondary | ICD-10-CM | POA: Diagnosis not present

## 2017-07-07 DIAGNOSIS — M1711 Unilateral primary osteoarthritis, right knee: Secondary | ICD-10-CM | POA: Diagnosis not present

## 2017-07-07 DIAGNOSIS — H1045 Other chronic allergic conjunctivitis: Secondary | ICD-10-CM | POA: Diagnosis not present

## 2017-07-07 DIAGNOSIS — M1712 Unilateral primary osteoarthritis, left knee: Secondary | ICD-10-CM | POA: Diagnosis not present

## 2017-07-07 DIAGNOSIS — H52223 Regular astigmatism, bilateral: Secondary | ICD-10-CM | POA: Diagnosis not present

## 2017-07-07 DIAGNOSIS — H5202 Hypermetropia, left eye: Secondary | ICD-10-CM | POA: Diagnosis not present

## 2017-07-07 LAB — HM DIABETES EYE EXAM

## 2017-07-08 ENCOUNTER — Ambulatory Visit: Payer: Self-pay | Admitting: Internal Medicine

## 2017-07-13 DIAGNOSIS — M1711 Unilateral primary osteoarthritis, right knee: Secondary | ICD-10-CM | POA: Diagnosis not present

## 2017-07-13 DIAGNOSIS — M1712 Unilateral primary osteoarthritis, left knee: Secondary | ICD-10-CM | POA: Diagnosis not present

## 2017-07-14 DIAGNOSIS — H5213 Myopia, bilateral: Secondary | ICD-10-CM | POA: Diagnosis not present

## 2017-07-20 ENCOUNTER — Ambulatory Visit (INDEPENDENT_AMBULATORY_CARE_PROVIDER_SITE_OTHER): Payer: Medicare Other | Admitting: Physician Assistant

## 2017-07-20 ENCOUNTER — Encounter: Payer: Self-pay | Admitting: Physician Assistant

## 2017-07-20 VITALS — BP 133/79 | HR 110 | Temp 97.2°F | Ht 62.0 in | Wt 160.2 lb

## 2017-07-20 DIAGNOSIS — W57XXXA Bitten or stung by nonvenomous insect and other nonvenomous arthropods, initial encounter: Secondary | ICD-10-CM | POA: Diagnosis not present

## 2017-07-20 DIAGNOSIS — S50862A Insect bite (nonvenomous) of left forearm, initial encounter: Secondary | ICD-10-CM | POA: Diagnosis not present

## 2017-07-20 NOTE — Patient Instructions (Signed)
Insect Bite, Adult An insect bite can make your skin red, itchy, and swollen. Some insects can spread disease to people with a bite. However, most insect bites do not lead to disease, and most are not serious. Follow these instructions at home: Bite area care  Do not scratch the bite area.  Keep the bite area clean and dry.  Wash the bite area every day with soap and water as told by your doctor.  Check the bite area every day for signs of infection. Check for: ? More redness, swelling, or pain. ? Fluid or blood. ? Warmth. ? Pus. Managing pain, itching, and swelling  You may put any of these on the bite area as told by your doctor: ? A baking soda paste. ? Cortisone cream. ? Calamine lotion.  If directed, put ice on the bite area. ? Put ice in a plastic bag. ? Place a towel between your skin and the bag. ? Leave the ice on for 20 minutes, 2-3 times a day. Medicines  Take medicines or put medicines on your skin only as told by your doctor.  If you were prescribed an antibiotic medicine, use it as told by your doctor. Do not stop using the antibiotic even if your condition improves. General instructions  Keep all follow-up visits as told by your doctor. This is important. How is this prevented? To help you have a lower risk of insect bites:  When you are outside, wear clothing that covers your arms and legs.  Use insect repellent. The best insect repellents have: ? An active ingredient of DEET, picaridin, oil of lemon eucalyptus (OLE), or IR3535. ? Higher amounts of DEET or another active ingredient than other repellents have.  If your home windows do not have screens, think about putting some in.  Contact a doctor if:  You have more redness, swelling, or pain in the bite area.  You have fluid, blood, or pus coming from the bite area.  The bite area feels warm.  You have a fever. Get help right away if:  You have joint pain.  You have a rash.  You have  shortness of breath.  You feel more tired or sleepy than you normally do.  You have neck pain.  You have a headache.  You feel weaker than you normally do.  You have chest pain.  You have pain in your belly.  You feel sick to your stomach (nauseous) or you throw up (vomit). Summary  An insect bite can make your skin red, itchy, and swollen.  Do not scratch the bite area, and keep it clean and dry.  Ice can help with pain and itching from the bite. This information is not intended to replace advice given to you by your health care provider. Make sure you discuss any questions you have with your health care provider. Document Released: 02/13/2000 Document Revised: 09/18/2015 Document Reviewed: 07/03/2014 Elsevier Interactive Patient Education  2018 Elsevier Inc.  

## 2017-07-20 NOTE — Progress Notes (Signed)
  Subjective:     Patient ID: Teresa [name], female   DOB: May 07, 1955, 62 y.o.   MRN: [id number]  HPI Pt went fishing and removed multiple ticks from her legs and L forearm No drainage from the sites + pruritus  Review of Systems  Constitutional: Negative.   Musculoskeletal: Negative.   Skin: Negative.        Objective:   Physical Exam Multiple erythem. Bites to the lower ext bilat and 1 bite to the L forearm Sl surrounding erythema but no induration No drainage noted No TTP No residual tick seen under magnification No joint edema seen No rashes apprec today    Assessment:     1. Insect bite (nonvenomous) of left forearm, initial encounter        Plan:     Cool compresses OTC antihistamines Nl course reviewed F/U prn

## 2017-07-21 ENCOUNTER — Ambulatory Visit (INDEPENDENT_AMBULATORY_CARE_PROVIDER_SITE_OTHER): Payer: Medicare Other | Admitting: Family Medicine

## 2017-07-21 ENCOUNTER — Encounter: Payer: Self-pay | Admitting: Family Medicine

## 2017-07-21 VITALS — BP 125/71 | HR 107 | Temp 97.5°F | Ht 62.0 in | Wt 163.4 lb

## 2017-07-21 DIAGNOSIS — F329 Major depressive disorder, single episode, unspecified: Secondary | ICD-10-CM | POA: Diagnosis not present

## 2017-07-21 DIAGNOSIS — F32A Depression, unspecified: Secondary | ICD-10-CM

## 2017-07-21 MED ORDER — BUPROPION HCL ER (XL) 150 MG PO TB24: 150.0000 mg | ORAL_TABLET | Freq: Every day | ORAL | 1 refills | Status: DC

## 2017-07-21 NOTE — Patient Instructions (Signed)
Great to see you!  Start Wellbutrin 1 pill once daily, come back in 3 to 4 weeks for follow-up for depression.

## 2017-07-21 NOTE — Progress Notes (Signed)
   HPI  Patient presents today with depression.  Patient states that after we increased her Remeron she realized that it was causing her to have difficulty balancing.  She stopped the medication and feels better. She denies any suicidal thoughts. She still feels depressed and anxious. She is managed with Valium from neurology as well as chronic narcotics.  Patient has had a long history of difficulty tolerating multiple medications including Prozac, Cymbalta, and now Remeron. She also had difficulty tolerating amitriptyline.  I again recommended psychiatry, she does not want to see a psychiatrist at this time.  She was released from [facility] health psychiatry due to no-shows  PMH: Smoking status noted ROS: Per HPI  Objective: BP 125/71   Pulse (!) 107   Temp (!) 97.5 F (36.4 C) (Oral)   Ht 5\' 2"  (1.575 m)   Wt 163 lb 6.4 oz (74.1 kg)   BMI 29.89 kg/m  Gen: NAD, alert, cooperative with exam HEENT: NCAT CV: RRR, good S1/S2, no murmur Resp: CTABL, no wheezes, non-labored Ext: No edema, warm Neuro: Alert and oriented, No gross deficits  Assessment and plan:  #Depression Patient has not tolerated multiple medications, starting Wellbutrin I typically do not start this when severe anxiety is also on the table, however she is on benzodiazepines chronically which likely control her anxiety at baseline. Starting low-dose 150 mg daily. Follow-up 3 to 4 weeks.   Meds ordered this encounter  Medications  . buPROPion (WELLBUTRIN XL) 150 MG 24 hr tablet    Sig: Take 1 tablet (150 mg total) by mouth daily.    Dispense:  30 tablet    Refill:  [place], MD [facility] 07/21/2017, 9:49 AM

## 2017-07-27 DIAGNOSIS — M545 Low back pain: Secondary | ICD-10-CM | POA: Diagnosis not present

## 2017-07-27 DIAGNOSIS — Z79891 Long term (current) use of opiate analgesic: Secondary | ICD-10-CM | POA: Diagnosis not present

## 2017-07-29 DIAGNOSIS — G5601 Carpal tunnel syndrome, right upper limb: Secondary | ICD-10-CM | POA: Diagnosis not present

## 2017-07-29 DIAGNOSIS — G5602 Carpal tunnel syndrome, left upper limb: Secondary | ICD-10-CM | POA: Diagnosis not present

## 2017-08-04 ENCOUNTER — Ambulatory Visit (INDEPENDENT_AMBULATORY_CARE_PROVIDER_SITE_OTHER): Payer: Medicare Other | Admitting: Family

## 2017-08-04 ENCOUNTER — Encounter: Payer: Self-pay | Admitting: Family

## 2017-08-04 VITALS — BP 122/84 | HR 116 | Temp 97.4°F | Ht 62.0 in | Wt 155.4 lb

## 2017-08-04 DIAGNOSIS — B86 Scabies: Secondary | ICD-10-CM

## 2017-08-04 MED ORDER — PERMETHRIN 5 % EX CREA: 1.0000 "application " | TOPICAL_CREAM | Freq: Once | CUTANEOUS | 1 refills | Status: AC

## 2017-08-04 NOTE — Patient Instructions (Signed)
Scabies, Adult Scabies is a skin condition that happens when very small insects get under the skin (infestation). This causes a rash and severe itchiness. Scabies can spread from person to person (is contagious). If you get scabies, it is common for others in your household to get scabies too. With proper treatment, symptoms usually go away in 2-4 weeks. Scabies usually does not cause lasting problems. What are the causes? This condition is caused by mites (Sarcoptes scabiei, or human itch mites) that can only be seen with a microscope. The mites get into the top layer of skin and lay eggs. Scabies can spread from person to person through:  Close contact with a person who has scabies.  Contact with infested items, such as towels, bedding, or clothing.  What increases the risk? This condition is more likely to develop in:  People who live in nursing homes and other extended-care facilities.  People who have sexual contact with a partner who has scabies.  Young children who attend child care facilities.  People who care for others who are at increased risk for scabies.  What are the signs or symptoms? Symptoms of this condition may include:  Severe itchiness. This is often worse at night.  A rash that includes tiny red bumps or blisters. The rash commonly occurs on the wrist, elbow, armpit, fingers, waist, groin, or buttocks. Bumps may form a line (burrow) in some areas.  Skin irritation. This can include scaly patches or sores.  How is this diagnosed? This condition is diagnosed with a physical exam. Your health care provider will look closely at your skin. In some cases, your health care provider may take a sample of your affected skin (skin scraping) and have it examined under a microscope. How is this treated? This condition may be treated with:  Medicated cream or lotion that kills the mites. This is spread on the entire body and left on for several hours. Usually, one treatment  with medicated cream or lotion is enough to kill all of the mites. In severe cases, the treatment may be repeated.  Medicated cream that relieves itching.  Medicines that help to relieve itching.  Medicines that kill the mites. This treatment is rarely used.  Follow these instructions at home:  Medicines  Take or apply over-the-counter and prescription medicines as told by your health care provider.  Apply medicated cream or lotion as told by your health care provider.  Do not wash off the medicated cream or lotion until the necessary amount of time has passed. Skin Care  Avoid scratching your affected skin.  Keep your fingernails closely trimmed to reduce injury from scratching.  Take cool baths or apply cool washcloths to help reduce itching. General instructions  Clean all items that you recently had contact with, including bedding, clothing, and furniture. Do this on the same day that your treatment starts. ? Use hot water when you wash items. ? Place unwashable items into closed, airtight plastic bags for at least 3 days. The mites cannot live for more than 3 days away from human skin. ? Vacuum furniture and mattresses that you use.  Make sure that other people who may have been infested are examined by a health care provider. These include members of your household and anyone who may have had contact with infested items.  Keep all follow-up visits as told by your health care provider. This is important. Contact a health care provider if:  You have itching that does not go away   after 4 weeks of treatment.  You continue to develop new bumps or burrows.  You have redness, swelling, or pain in your rash area after treatment.  You have fluid, blood, or pus coming from your rash. This information is not intended to replace advice given to you by your health care provider. Make sure you discuss any questions you have with your health care provider. Document Released:  11/06/2014 Document Revised: 07/24/2015 Document Reviewed: 09/17/2014 Elsevier Interactive Patient Education  2018 Elsevier Inc.  

## 2017-08-04 NOTE — Progress Notes (Addendum)
   Subjective:    Patient ID: Teresa [name], female    DOB: Jul 24, 1955, 62 y.o.   MRN: [id number]  Chief Complaint  Patient presents with  . Rash    itching    Rash  This is a new problem. The current episode started in the past 7 days. The problem is unchanged. The affected locations include the abdomen, right arm and left arm. The rash is characterized by itchiness and redness. Past treatments include anti-itch cream. The treatment provided no relief.      Review of Systems  Skin: Positive for rash.  All other systems reviewed and are negative.      Objective:   Physical Exam  Constitutional: She is oriented to person, place, and time. She appears well-developed and well-nourished. No distress.  HENT:  Head: Normocephalic and atraumatic.  Eyes: Pupils are equal, round, and reactive to light.  Neck: Normal range of motion. Neck supple. No thyromegaly present.  Cardiovascular: Normal rate, regular rhythm, normal heart sounds and intact distal pulses.  No murmur heard. Pulmonary/Chest: Effort normal and breath sounds normal. No respiratory distress. She has no wheezes.  Abdominal: Soft. Bowel sounds are normal. She exhibits no distension. There is no tenderness.  Musculoskeletal: Normal range of motion. She exhibits no edema or tenderness.  Neurological: She is alert and oriented to person, place, and time. She has normal reflexes. No cranial nerve deficit.  Skin: Skin is warm and dry. Rash noted. There is erythema.  Erythemas linear rash on left wrist and lower abdomen   Psychiatric: She has a normal mood and affect. Her behavior is normal. Judgment and thought content normal.  Vitals reviewed.     BP 122/84   Pulse (!) 116   Temp (!) 97.4 F (36.3 C) (Oral)   Ht 5\' 2"  (1.575 m)   Wt 155 lb 6.4 oz (70.5 kg)   BMI 28.42 kg/m      Assessment & Plan:  1. Scabies Use the permethrin cream today Wash all bedding and clothing in hot water Do not scratch RTO if rash  does not improve or worsens  - permethrin (ELIMITE) 5 % cream; Apply 1 application topically once for 1 dose. Apply from neck to the soles of feet, wash after 8- 14 hours and repeat 14 days  Dispense: 60 g; Refill: [place], FNP

## 2017-08-18 ENCOUNTER — Other Ambulatory Visit: Payer: Self-pay | Admitting: Family Medicine

## 2017-08-19 ENCOUNTER — Telehealth: Payer: Self-pay | Admitting: Family Medicine

## 2017-08-19 MED ORDER — PERMETHRIN 5 % EX CREA: 1.0000 "application " | TOPICAL_CREAM | Freq: Once | CUTANEOUS | 0 refills | Status: AC

## 2017-08-19 NOTE — Telephone Encounter (Signed)
Patient seen Teresa [name] 6/6 for scabies and was given Permethrin 5%. Stating she still feels like she has bugs on her and is requesting a refill sent to [facility]. Please advise

## 2017-08-19 NOTE — Telephone Encounter (Signed)
Patient aware.

## 2017-08-19 NOTE — Telephone Encounter (Signed)
Repeat [place], MD [facility] 08/19/2017, 4:02 PM

## 2017-08-23 ENCOUNTER — Encounter: Payer: Self-pay | Admitting: Family Medicine

## 2017-08-23 ENCOUNTER — Ambulatory Visit (INDEPENDENT_AMBULATORY_CARE_PROVIDER_SITE_OTHER): Payer: Medicare Other | Admitting: Family Medicine

## 2017-08-23 VITALS — BP 121/77 | HR 106 | Temp 97.3°F | Ht 62.0 in | Wt 156.0 lb

## 2017-08-23 DIAGNOSIS — J310 Chronic rhinitis: Secondary | ICD-10-CM | POA: Diagnosis not present

## 2017-08-23 DIAGNOSIS — I10 Essential (primary) hypertension: Secondary | ICD-10-CM | POA: Diagnosis not present

## 2017-08-23 DIAGNOSIS — R7303 Prediabetes: Secondary | ICD-10-CM | POA: Diagnosis not present

## 2017-08-23 DIAGNOSIS — J4540 Moderate persistent asthma, uncomplicated: Secondary | ICD-10-CM

## 2017-08-23 LAB — BAYER DCA HB A1C WAIVED: HB A1C (BAYER DCA - WAIVED): 6.2 % (ref <7.0)

## 2017-08-23 NOTE — Progress Notes (Signed)
   HPI  Patient presents today follow-up chronic medical conditions.  Patient denies any current concerns.  She brings in a small bag of bugs, she is recently used a second course of permethrin and states that she is improving.  Asthma Doing well with Symbicort, has noticed that this really makes a difference for her.  Hypertension Good medication compliance with lisinopril.  Allergies Worsening this time a year  PMH: Smoking status noted ROS: Per HPI  Objective: BP 121/77   Pulse (!) 106   Temp (!) 97.3 F (36.3 C) (Oral)   Ht '5\' 2"'$  (1.575 m)   Wt 156 lb (70.8 kg)   BMI 28.53 kg/m  Gen: NAD, alert, cooperative with exam HEENT: NCAT, nares clear with some erythema bilaterally CV: RRR, good S1/S2, no murmur Resp: CTABL, no wheezes, non-labored Ext: No edema, warm Neuro: Alert and oriented, No gross deficits  Assessment and plan:  #Hypertension Well-controlled, no changes  #Prediabetes Highest A1c seem to 6.5, repeat today  #Chronic rhinitis Slightly exacerbated today, continue Singulair  #Asthma Well-controlled with Symbicort plus as needed albuterol    Orders Placed This Encounter  Procedures  . CMP14+EGFR  . CBC with Differential/Platelet  . Bayer DCA Hb A1c Waived  . Lipid panel     Laroy Apple, MD [facility] 08/23/2017, 2:21 PM

## 2017-08-23 NOTE — Patient Instructions (Signed)
Great to see you!   

## 2017-08-24 LAB — CBC WITH DIFFERENTIAL/PLATELET
BASOS ABS: 0 10*3/uL (ref 0.0–0.2)
BASOS: 0 %
EOS (ABSOLUTE): 0.2 10*3/uL (ref 0.0–0.4)
Eos: 2 %
Hematocrit: 39.4 % (ref 34.0–46.6)
Hemoglobin: 13 g/dL (ref 11.1–15.9)
IMMATURE GRANS (ABS): 0 10*3/uL (ref 0.0–0.1)
IMMATURE GRANULOCYTES: 0 %
LYMPHS: 25 %
Lymphocytes Absolute: 1.6 10*3/uL (ref 0.7–3.1)
MCH: 26.7 pg (ref 26.6–33.0)
MCHC: 33 g/dL (ref 31.5–35.7)
MCV: 81 fL (ref 79–97)
MONOS ABS: 0.4 10*3/uL (ref 0.1–0.9)
Monocytes: 6 %
NEUTROS PCT: 67 %
Neutrophils Absolute: 4.1 10*3/uL (ref 1.4–7.0)
PLATELETS: 182 10*3/uL (ref 150–450)
RBC: 4.87 x10E6/uL (ref 3.77–5.28)
RDW: 15.1 % (ref 12.3–15.4)
WBC: 6.2 10*3/uL (ref 3.4–10.8)

## 2017-08-24 LAB — CMP14+EGFR
A/G RATIO: 1.4 (ref 1.2–2.2)
ALK PHOS: 90 IU/L (ref 39–117)
ALT: 27 IU/L (ref 0–32)
AST: 38 IU/L (ref 0–40)
Albumin: 4 g/dL (ref 3.5–5.5)
BILIRUBIN TOTAL: 0.7 mg/dL (ref 0.0–1.2)
BUN/Creatinine Ratio: 9 (ref 9–23)
BUN: 8 mg/dL (ref 6–24)
CALCIUM: 9.1 mg/dL (ref 8.7–10.2)
CHLORIDE: 105 mmol/L (ref 96–106)
CO2: 25 mmol/L (ref 20–29)
Creatinine, Ser: 0.85 mg/dL (ref 0.57–1.00)
GFR calc Af Amer: 87 mL/min/{1.73_m2} (ref >59)
GFR, EST NON AFRICAN AMERICAN: 75 mL/min/{1.73_m2} (ref >59)
GLOBULIN, TOTAL: 2.9 g/dL (ref 1.5–4.5)
Glucose: 140 mg/dL — ABNORMAL HIGH (ref 65–99)
Potassium: 4.2 mmol/L (ref 3.5–5.2)
SODIUM: 143 mmol/L (ref 134–144)
Total Protein: 6.9 g/dL (ref 6.0–8.5)

## 2017-08-24 LAB — LIPID PANEL
CHOLESTEROL TOTAL: 108 mg/dL (ref 100–199)
Chol/HDL Ratio: 2 ratio (ref 0.0–4.4)
HDL: 55 mg/dL (ref >39)
LDL Calculated: 41 mg/dL (ref 0–99)
Triglycerides: 62 mg/dL (ref 0–149)
VLDL Cholesterol Cal: 12 mg/dL (ref 5–40)

## 2017-08-31 ENCOUNTER — Other Ambulatory Visit: Payer: Self-pay | Admitting: Family

## 2017-08-31 ENCOUNTER — Other Ambulatory Visit: Payer: Self-pay | Admitting: Family Medicine

## 2017-08-31 DIAGNOSIS — I10 Essential (primary) hypertension: Secondary | ICD-10-CM

## 2017-09-18 ENCOUNTER — Other Ambulatory Visit: Payer: Self-pay | Admitting: Family Medicine

## 2017-09-26 ENCOUNTER — Other Ambulatory Visit: Payer: Self-pay | Admitting: Family

## 2017-09-26 DIAGNOSIS — M25569 Pain in unspecified knee: Secondary | ICD-10-CM | POA: Diagnosis not present

## 2017-09-26 DIAGNOSIS — G5600 Carpal tunnel syndrome, unspecified upper limb: Secondary | ICD-10-CM | POA: Diagnosis not present

## 2017-09-26 DIAGNOSIS — J4540 Moderate persistent asthma, uncomplicated: Secondary | ICD-10-CM

## 2017-09-26 DIAGNOSIS — Z79891 Long term (current) use of opiate analgesic: Secondary | ICD-10-CM | POA: Diagnosis not present

## 2017-09-26 DIAGNOSIS — M545 Low back pain: Secondary | ICD-10-CM | POA: Diagnosis not present

## 2017-09-26 DIAGNOSIS — K5904 Chronic idiopathic constipation: Secondary | ICD-10-CM

## 2017-10-18 ENCOUNTER — Encounter (INDEPENDENT_AMBULATORY_CARE_PROVIDER_SITE_OTHER): Payer: Self-pay | Admitting: *Deleted

## 2017-10-18 ENCOUNTER — Telehealth (INDEPENDENT_AMBULATORY_CARE_PROVIDER_SITE_OTHER): Payer: Self-pay | Admitting: *Deleted

## 2017-10-18 ENCOUNTER — Ambulatory Visit (INDEPENDENT_AMBULATORY_CARE_PROVIDER_SITE_OTHER): Payer: Medicare Other | Admitting: Internal Medicine

## 2017-10-18 ENCOUNTER — Encounter (INDEPENDENT_AMBULATORY_CARE_PROVIDER_SITE_OTHER): Payer: Self-pay | Admitting: Internal Medicine

## 2017-10-18 ENCOUNTER — Other Ambulatory Visit (INDEPENDENT_AMBULATORY_CARE_PROVIDER_SITE_OTHER): Payer: Self-pay | Admitting: Internal Medicine

## 2017-10-18 VITALS — BP 150/80 | HR 80 | Temp 97.6°F | Ht 62.0 in | Wt 150.8 lb

## 2017-10-18 DIAGNOSIS — Z86010 Personal history of colonic polyps: Secondary | ICD-10-CM | POA: Insufficient documentation

## 2017-10-18 DIAGNOSIS — B182 Chronic viral hepatitis C: Secondary | ICD-10-CM | POA: Diagnosis not present

## 2017-10-18 MED ORDER — SUPREP BOWEL PREP KIT 17.5-3.13-1.6 GM/177ML PO SOLN: 1.0000 | Freq: Once | ORAL | 0 refills | Status: AC

## 2017-10-18 NOTE — Patient Instructions (Addendum)
[place] RUQ Hep C quaint.  The risks of bleeding, perforation and infection were reviewed with patient.

## 2017-10-18 NOTE — Progress Notes (Signed)
Subjective:    Patient ID: Teresa [name], female    DOB: 1955-08-27, 62 y.o.   MRN: [id number]  HPI Here today for f/u. Last seen in August of 2018.  Treated in 2017.  Treated with Harvoni. Genotype 1A.  She cleared the virus.  Last colonoscopy in 2015. 7 polyps cold snared. Moderate diverticulosis.  All were tubular adenomas. No high grade dysplasia.   She is due for a colonoscopy [er Dr Henrene Pastor. She would like to transfer her care to our office since it is closer to home. We are agreeable.  She tells me she is doing good. Her appetite is good. No weight loss.No GI complaints. Has a BM daily. No melena or BRRB. Maintained on chronic pain medication for chronic back pain.      08/30/2016 US abdomen: Hepatitis C 1. Liver is prominent. Increased hepatic echogenicity is again noted consistent with fatty infiltration and/or hepatocellular disease . No focal hepatic abnormality identified. In March of 2017 she underwent a DG Esophagram for dysphagia.IMPRESSION: 1. No evidence of recurrent stricture. 2. Nonspecific esophageal motility disorder with occasional tertiary contractions. 3. Small hiatal hernia.   Recently lost her grandson in November to a drug over dose.  12/12/2015 [place] RUQ;  IMPRESSION: Persistent increased hepatic echotexture consistent with fatty infiltration or early cirrhosis. There is no focal mass or ductal dilation. No ascites is observed in the right upper quadrant. 07/18/2013 EGD/ED: Dr. Henrene Pastor, dysphagia: Normal EGD, Status post [facility] dilation for dysphagia.[facility] dilator passed into the esophagus without resistance or heme).    Hx of EGD/ED in the past by Dr Henrene Pastor at [facility] in 2015. She has had multiple EGD/ED    Review of Systems Past Medical History:  Diagnosis Date  . Anxiety state, unspecified   . Asthma    prn inhaler  . Chronic back pain greater than 3 months duration   . Colon polyp   . Degeneration of intervertebral disc, site  unspecified   . Depressive disorder, not elsewhere classified   . Diverticulitis   . Diverticulosis 07-08-2005   colonoscopy  . Esophageal stricture 2014  . Full dentures   . GERD (gastroesophageal reflux disease) 07-08-2005   EGD  . Hiatal hernia 07-08-2005   EGD  . Insomnia   . Irritable bowel syndrome   . Migraine   . Muscle spasm   . Osteoarthrosis, unspecified whether generalized or localized, lower leg   . Papilloma of breast 09/2011   left  . Seasonal allergies   . Seizures ([place]) 1990s   x 1, unknown cause; no seizures since  . Vocal cord polyp    history of    Past Surgical History:  Procedure Laterality Date  . ABDOMINAL HYSTERECTOMY     partial  . BILATERAL SALPINGOOPHORECTOMY    . BLADDER SURGERY     bladder tack  . BREAST BIOPSY  11/03/2011   Procedure: BREAST BIOPSY WITH NEEDLE LOCALIZATION;  Surgeon: Harl Bowie, MD;  Location: [place];  Service: General;  Laterality: Left;  needle localized left breast biopsy  . CHOLECYSTECTOMY    . COLONOSCOPY  07/08/2005   562.10  . CYSTOSCOPY  08/24/2011   Procedure: CYSTOSCOPY;  Surgeon: Reece Packer, MD;  Location: [facility];  Service: Urology;  Laterality: N/A;  Cystoscopy,  Rectocele Repair and Vault Prolapse Repair  . ESOPHAGOGASTRODUODENOSCOPY  10/03/2012   multiple   . OTHER SURGICAL HISTORY     exc. vocal cord polyp  . RECTAL  TUMOR BY PROCTOTOMY EXCISION    . RECTOCELE REPAIR  08/24/2011   Procedure: POSTERIOR REPAIR (RECTOCELE);  Surgeon: Reece Packer, MD;  Location: [facility];  Service: Urology;  Laterality: N/A;  . rectocelle repair    . TUMOR REMOVAL     benign  . VAGINAL PROLAPSE REPAIR  08/24/2011   Procedure: VAGINAL VAULT SUSPENSION;  Surgeon: Reece Packer, MD;  Location: [facility];  Service: Urology;  Laterality: N/A;    Allergies  Allergen Reactions  . Lyrica [Pregabalin] Shortness Of Breath  . Trazodone And Nefazodone Shortness Of Breath  . Amitriptyline   .  Arthrotec [Diclofenac-Misoprostol] Other (See Comments)    Upset stomach  . Keflex [Cephalexin] Other (See Comments)    Unknown reaction  . Naproxen Other (See Comments)    Irritates stomach  . Prozac [Fluoxetine Hcl] Other (See Comments)    "makes me feel bad"  . Remeron [Mirtazapine] Other (See Comments)    "off balance"  . Ambien [Zolpidem Tartrate] Other (See Comments)    COULDN'T FUNCTION, STAYED SLEEPY    Current Outpatient Medications on File Prior to Visit  Medication Sig Dispense Refill  . buprenorphine (BUTRANS - DOSED MCG/HR) 10 MCG/HR PTWK patch 20 mcg.     Marland Kitchen buPROPion (WELLBUTRIN XL) 150 MG 24 hr tablet TAKE ONE (1) TABLET EACH DAY 90 tablet 0  . cetirizine (ZYRTEC) 10 MG tablet TAKE ONE TABLET EVERY MORNING 30 tablet 11  . cyclobenzaprine (FLEXERIL) 10 MG tablet 2 (two) times daily after a meal.     . DEXILANT 60 MG capsule TAKE ONE (1) CAPSULE EACH DAY 90 capsule 1  . diazepam (VALIUM) 5 MG tablet Take 5 mg by mouth daily.     Marland Kitchen docusate sodium (COLACE) 100 MG capsule Take 100-200 mg by mouth daily as needed for mild constipation.     . fluticasone (FLONASE) 50 MCG/ACT nasal spray USE 2 SPRAYS IN EACH NOSTRIL DAILY 16 g 5  . lisinopril (PRINIVIL,ZESTRIL) 10 MG tablet TAKE ONE (1) TABLET EACH DAY 90 tablet 1  . mometasone (NASONEX) 50 MCG/ACT nasal spray USE 2 SPRAYS IN EACH NOSTRIL ONCE DAILY 51 g 3  . montelukast (SINGULAIR) 10 MG tablet TAKE ONE (1) TABLET EACH DAY 90 tablet 2  . Olopatadine HCl 0.2 % SOLN Place 1 drop into both eyes daily as needed for irritation.    . ondansetron (ZOFRAN) 4 MG tablet     . oxymorphone (OPANA) 5 MG tablet Take 1 tablet by mouth daily.    . polyethylene glycol powder (GLYCOLAX/MIRALAX) powder MIX 17 GRAMS INTO 8OZ OF WATER AND DRINK DAILY 527 g 4  . PROAIR HFA 108 (90 Base) MCG/ACT inhaler USE 1 TO 2 PUFFS EVERY 4 TO 6 HOURS AS NEEDED 25.5 g 4  . Probiotic Product (PROBIOTIC DAILY PO) Take by mouth.    . SYMBICORT 160-4.5 MCG/ACT  inhaler USE 2 PUFFS TWICE DAILY 10.2 g 2   No current facility-administered medications on file prior to visit.         Objective:   Physical Exam Blood pressure (!) 150/80, pulse 80, temperature 97.6 F (36.4 C), height 5\' 2"  (1.575 m), weight 150 lb 12.8 oz (68.4 kg). Alert and oriented. Skin warm and dry. Oral mucosa is moist.   . Sclera anicteric, conjunctivae is pink. Thyroid not enlarged. No cervical lymphadenopathy. Lungs clear. Heart regular rate and rhythm.  Abdomen is soft. Bowel sounds are positive. No hepatomegaly. No abdominal masses felt. No tenderness.  No  edema to lower extremities.  .         Assessment & Plan:  Hepatitis C. Am going to get an [place] RUQ and Hepatitis C quaint.  OV in 1 year. Colon polyps., Colonoscopy with propofol. Will accept her for a colonoscopy since this will be closer to home.

## 2017-10-18 NOTE — Telephone Encounter (Signed)
Patient needs suprep 

## 2017-10-20 LAB — HEPATITIS C RNA QUANTITATIVE
HCV QUANT LOG: NOT DETECTED {Log_IU}/mL
HCV RNA, PCR, QN: NOT DETECTED [IU]/mL

## 2017-10-21 ENCOUNTER — Other Ambulatory Visit: Payer: Self-pay | Admitting: Family

## 2017-10-21 DIAGNOSIS — J4540 Moderate persistent asthma, uncomplicated: Secondary | ICD-10-CM

## 2017-10-25 ENCOUNTER — Ambulatory Visit (HOSPITAL_COMMUNITY)
Admission: RE | Admit: 2017-10-25 | Discharge: 2017-10-25 | Disposition: A | Discharge status: Home / Self Care | Payer: Medicare Other | Source: Ambulatory Visit | Attending: Internal Medicine | Admitting: Internal Medicine

## 2017-10-25 DIAGNOSIS — K760 Fatty (change of) liver, not elsewhere classified: Secondary | ICD-10-CM | POA: Insufficient documentation

## 2017-10-25 DIAGNOSIS — B182 Chronic viral hepatitis C: Secondary | ICD-10-CM | POA: Diagnosis present

## 2017-11-03 ENCOUNTER — Other Ambulatory Visit: Payer: Self-pay | Admitting: Family

## 2017-11-09 ENCOUNTER — Telehealth (INDEPENDENT_AMBULATORY_CARE_PROVIDER_SITE_OTHER): Payer: Self-pay | Admitting: *Deleted

## 2017-11-09 NOTE — Telephone Encounter (Signed)
noted 

## 2017-11-09 NOTE — Telephone Encounter (Signed)
Patient called and canceled TCS for 11/18/17

## 2017-11-11 ENCOUNTER — Other Ambulatory Visit (INDEPENDENT_AMBULATORY_CARE_PROVIDER_SITE_OTHER): Payer: Self-pay | Admitting: Internal Medicine

## 2017-11-11 ENCOUNTER — Encounter (INDEPENDENT_AMBULATORY_CARE_PROVIDER_SITE_OTHER): Payer: Self-pay | Admitting: *Deleted

## 2017-11-11 ENCOUNTER — Other Ambulatory Visit (HOSPITAL_COMMUNITY): Payer: Self-pay

## 2017-11-11 DIAGNOSIS — Z86010 Personal history of colonic polyps: Secondary | ICD-10-CM

## 2017-11-18 ENCOUNTER — Encounter (HOSPITAL_COMMUNITY): Admission: RE | Payer: Self-pay | Source: Ambulatory Visit

## 2017-11-18 ENCOUNTER — Ambulatory Visit (HOSPITAL_COMMUNITY): Admission: RE | Admit: 2017-11-18 | Payer: Medicare Other | Source: Ambulatory Visit | Admitting: Internal Medicine

## 2017-11-18 SURGERY — COLONOSCOPY WITH PROPOFOL
Anesthesia: Monitor Anesthesia Care

## 2017-11-20 DIAGNOSIS — Z87891 Personal history of nicotine dependence: Secondary | ICD-10-CM | POA: Diagnosis not present

## 2017-11-20 DIAGNOSIS — R1030 Lower abdominal pain, unspecified: Secondary | ICD-10-CM | POA: Diagnosis not present

## 2017-11-20 DIAGNOSIS — N3001 Acute cystitis with hematuria: Secondary | ICD-10-CM | POA: Diagnosis not present

## 2017-11-20 DIAGNOSIS — Z79899 Other long term (current) drug therapy: Secondary | ICD-10-CM | POA: Diagnosis not present

## 2017-11-20 DIAGNOSIS — N390 Urinary tract infection, site not specified: Secondary | ICD-10-CM | POA: Diagnosis not present

## 2017-11-20 DIAGNOSIS — J45909 Unspecified asthma, uncomplicated: Secondary | ICD-10-CM | POA: Diagnosis not present

## 2017-11-23 ENCOUNTER — Encounter: Payer: Self-pay | Admitting: Family

## 2017-11-23 ENCOUNTER — Ambulatory Visit: Payer: Medicare Other | Admitting: Family

## 2017-11-23 ENCOUNTER — Ambulatory Visit (INDEPENDENT_AMBULATORY_CARE_PROVIDER_SITE_OTHER): Payer: Medicare Other | Admitting: Family

## 2017-11-23 VITALS — BP 128/77 | HR 99 | Temp 97.8°F | Ht 62.0 in | Wt 152.8 lb

## 2017-11-23 DIAGNOSIS — L989 Disorder of the skin and subcutaneous tissue, unspecified: Secondary | ICD-10-CM | POA: Diagnosis not present

## 2017-11-23 DIAGNOSIS — B86 Scabies: Secondary | ICD-10-CM | POA: Diagnosis not present

## 2017-11-23 MED ORDER — PERMETHRIN 5 % EX CREA: 1.0000 "application " | TOPICAL_CREAM | Freq: Once | CUTANEOUS | 0 refills | Status: DC

## 2017-11-23 NOTE — Progress Notes (Addendum)
   Subjective:    Patient ID: Teresa [name], female    DOB: Mar 16, 1955, 62 y.o.   MRN: [id number]  Chief Complaint  Patient presents with  . bug bites    Rash  This is a recurrent problem. The current episode started in the past 7 days. The problem has been gradually worsening since onset. The affected locations include the head, chest, left lower leg and left upper leg. The rash is characterized by itchiness, dryness and redness. Pertinent negatives include no cough, diarrhea, fever or joint pain. Past treatments include antihistamine. The treatment provided mild relief.  Skin lesion Pt states she has a skin lesion on right lower calf and upper breast that she had several months. No pain or bleeding.     Review of Systems  Constitutional: Negative for fever.  Respiratory: Negative for cough.   Gastrointestinal: Negative for diarrhea.  Musculoskeletal: Negative for joint pain.  Skin: Positive for rash.  All other systems reviewed and are negative.      Objective:   Physical Exam  Constitutional: She is oriented to person, place, and time. She appears well-developed and well-nourished. No distress.  HENT:  Head: Normocephalic and atraumatic.  Eyes: Pupils are equal, round, and reactive to light.  Neck: Normal range of motion. Neck supple. No thyromegaly present.  Cardiovascular: Normal rate, regular rhythm, normal heart sounds and intact distal pulses.  No murmur heard. Pulmonary/Chest: Effort normal and breath sounds normal. No respiratory distress. She has no wheezes.  Abdominal: Soft. Bowel sounds are normal. She exhibits no distension. There is no tenderness.  Musculoskeletal: Normal range of motion. She exhibits no edema or tenderness.  Neurological: She is alert and oriented to person, place, and time. She has normal reflexes. No cranial nerve deficit.  Skin: Skin is warm and dry. Lesion and rash noted. Rash is papular.     Psychiatric: She has a normal mood and  affect. Her behavior is normal. Judgment and thought content normal.  Vitals reviewed.   Cryotherapy used on skin lesion on chest and on back of left calf. Tolerated well.   BP 128/77   Pulse 99   Temp 97.8 F (36.6 C) (Oral)   Ht 5\' 2"  (1.575 m)   Wt 152 lb 12.8 oz (69.3 kg)   BMI 27.95 kg/m      Assessment & Plan:  LATRIA MCCARRON comes in today with chief complaint of bug bites   Diagnosis and orders addressed:  1. Scabies Do not scratch  Wash all clothing, bedding in hot water Vacuum all furniture and mattresses  RTO if symptoms worsen or do not improve  - permethrin (ELIMITE) 5 % cream; Apply 1 application topically once for 1 dose.  Dispense: 60 g; Refill: 0   2. Skin lesion Do not pick at    Evelina Dun, [place]

## 2017-11-23 NOTE — Patient Instructions (Signed)
Scabies, Adult Scabies is a skin condition that happens when very small insects get under the skin (infestation). This causes a rash and severe itchiness. Scabies can spread from person to person (is contagious). If you get scabies, it is common for others in your household to get scabies too. With proper treatment, symptoms usually go away in 2-4 weeks. Scabies usually does not cause lasting problems. What are the causes? This condition is caused by mites (Sarcoptes scabiei, or human itch mites) that can only be seen with a microscope. The mites get into the top layer of skin and lay eggs. Scabies can spread from person to person through:  Close contact with a person who has scabies.  Contact with infested items, such as towels, bedding, or clothing.  What increases the risk? This condition is more likely to develop in:  People who live in nursing homes and other extended-care facilities.  People who have sexual contact with a partner who has scabies.  Young children who attend child care facilities.  People who care for others who are at increased risk for scabies.  What are the signs or symptoms? Symptoms of this condition may include:  Severe itchiness. This is often worse at night.  A rash that includes tiny red bumps or blisters. The rash commonly occurs on the wrist, elbow, armpit, fingers, waist, groin, or buttocks. Bumps may form a line (burrow) in some areas.  Skin irritation. This can include scaly patches or sores.  How is this diagnosed? This condition is diagnosed with a physical exam. Your health care provider will look closely at your skin. In some cases, your health care provider may take a sample of your affected skin (skin scraping) and have it examined under a microscope. How is this treated? This condition may be treated with:  Medicated cream or lotion that kills the mites. This is spread on the entire body and left on for several hours. Usually, one treatment  with medicated cream or lotion is enough to kill all of the mites. In severe cases, the treatment may be repeated.  Medicated cream that relieves itching.  Medicines that help to relieve itching.  Medicines that kill the mites. This treatment is rarely used.  Follow these instructions at home:  Medicines  Take or apply over-the-counter and prescription medicines as told by your health care provider.  Apply medicated cream or lotion as told by your health care provider.  Do not wash off the medicated cream or lotion until the necessary amount of time has passed. Skin Care  Avoid scratching your affected skin.  Keep your fingernails closely trimmed to reduce injury from scratching.  Take cool baths or apply cool washcloths to help reduce itching. General instructions  Clean all items that you recently had contact with, including bedding, clothing, and furniture. Do this on the same day that your treatment starts. ? Use hot water when you wash items. ? Place unwashable items into closed, airtight plastic bags for at least 3 days. The mites cannot live for more than 3 days away from human skin. ? Vacuum furniture and mattresses that you use.  Make sure that other people who may have been infested are examined by a health care provider. These include members of your household and anyone who may have had contact with infested items.  Keep all follow-up visits as told by your health care provider. This is important. Contact a health care provider if:  You have itching that does not go away   after 4 weeks of treatment.  You continue to develop new bumps or burrows.  You have redness, swelling, or pain in your rash area after treatment.  You have fluid, blood, or pus coming from your rash. This information is not intended to replace advice given to you by your health care provider. Make sure you discuss any questions you have with your health care provider. Document Released:  11/06/2014 Document Revised: 07/24/2015 Document Reviewed: 09/17/2014 Elsevier Interactive Patient Education  2018 Elsevier Inc.  

## 2017-11-24 ENCOUNTER — Other Ambulatory Visit: Payer: Self-pay | Admitting: Family

## 2017-11-25 DIAGNOSIS — M545 Low back pain: Secondary | ICD-10-CM | POA: Diagnosis not present

## 2017-11-25 DIAGNOSIS — G5600 Carpal tunnel syndrome, unspecified upper limb: Secondary | ICD-10-CM | POA: Diagnosis not present

## 2017-11-25 DIAGNOSIS — M25569 Pain in unspecified knee: Secondary | ICD-10-CM | POA: Diagnosis not present

## 2017-11-26 ENCOUNTER — Other Ambulatory Visit: Payer: Self-pay | Admitting: Family

## 2017-11-29 NOTE — Patient Instructions (Signed)
Teresa [name]  11/29/2017     @PREFPERIOPPHARMACY @   Your procedure is scheduled on  12/09/2017.  Report to [facility] at  1210   A.M.  Call this number if you have problems the morning of surgery:  [phone]   Remember:  Do not eat or drink after midnight.  You may drink clear liquids until  ( follow the instructions given to you) .  Clear liquids allowed are:                    Water, Juice (non-citric and without pulp), Carbonated beverages, Clear Tea, Black Coffee only, Plain Jell-O only, Gatorade and Plain Popsicles only    Take these medicines the morning of surgery with A SIP OF WATER  Wellbutrin, zyrtec, valium( if needed), dexilant, cymbalta, lisinopril, singulair, zofran ( if needed), oxymorphone ( if needed). Use you inhalers before you come.    Do not wear jewelry, make-up or nail polish.  Do not wear lotions, powders, or perfumes, or deodorant.  Do not shave 48 hours prior to surgery.  Men may shave face and neck.  Do not bring valuables to the hospital.  [facility] is not responsible for any belongings or valuables.  Contacts, dentures or bridgework may not be worn into surgery.  Leave your suitcase in the car.  After surgery it may be brought to your room.  For patients admitted to the hospital, discharge time will be determined by your treatment team.  Patients discharged the day of surgery will not be allowed to drive home.   Name and phone number of your driver:   family Special instructions:  Follow the diet and prep instructions given to you by Dr Olevia Perches office.  Please read over the following fact sheets that you were given. Anesthesia Post-op Instructions and Care and Recovery After Surgery       Colonoscopy, Adult A colonoscopy is an exam to look at the large intestine. It is done to check for problems, such as:  Lumps (tumors).  Growths (polyps).  Swelling (inflammation).  Bleeding.  What happens before the  procedure? Eating and drinking Follow instructions from your doctor about eating and drinking. These instructions may include:  A few days before the procedure - follow a low-fiber diet. ? Avoid nuts. ? Avoid seeds. ? Avoid dried fruit. ? Avoid raw fruits. ? Avoid vegetables.  1-3 days before the procedure - follow a clear liquid diet. Avoid liquids that have red or purple dye. Drink only clear liquids, such as: ? Clear broth or bouillon. ? Black coffee or tea. ? Clear juice. ? Clear soft drinks or sports drinks. ? Gelatin dessert. ? Popsicles.  On the day of the procedure - do not eat or drink anything during the 2 hours before the procedure.  Bowel prep If you were prescribed an oral bowel prep:  Take it as told by your doctor. Starting the day before your procedure, you will need to drink a lot of liquid. The liquid will cause you to poop (have bowel movements) until your poop is almost clear or light green.  If your skin or butt gets irritated from diarrhea, you may: ? Wipe the area with wipes that have medicine in them, such as adult wet wipes with aloe and vitamin E. ? Put something on your skin that soothes the area, such as petroleum jelly.  If you throw up (vomit) while drinking the  bowel prep, take a break for up to 60 minutes. Then begin the bowel prep again. If you keep throwing up and you cannot take the bowel prep without throwing up, call your doctor.  General instructions  Ask your doctor about changing or stopping your normal medicines. This is important if you take diabetes medicines or blood thinners.  Plan to have someone take you home from the hospital or clinic. What happens during the procedure?  An IV tube may be put into one of your veins.  You will be given medicine to help you relax (sedative).  To reduce your risk of infection: ? Your doctors will wash their hands. ? Your anal area will be washed with soap.  You will be asked to lie on your  side with your knees bent.  Your doctor will get a long, thin, flexible tube ready. The tube will have a camera and a light on the end.  The tube will be put into your anus.  The tube will be gently put into your large intestine.  Air will be delivered into your large intestine to keep it open. You may feel some pressure or cramping.  The camera will be used to take photos.  A small tissue sample may be removed from your body to be looked at under a microscope (biopsy). If any possible problems are found, the tissue will be sent to a lab for testing.  If small growths are found, your doctor may remove them and have them checked for cancer.  The tube that was put into your anus will be slowly removed. The procedure may vary among doctors and hospitals. What happens after the procedure?  Your doctor will check on you often until the medicines you were given have worn off.  Do not drive for 24 hours after the procedure.  You may have a small amount of blood in your poop.  You may pass gas.  You may have mild cramps or bloating in your belly (abdomen).  It is up to you to get the results of your procedure. Ask your doctor, or the department performing the procedure, when your results will be ready. This information is not intended to replace advice given to you by your health care provider. Make sure you discuss any questions you have with your health care provider. Document Released: 03/20/2010 Document Revised: 12/17/2015 Document Reviewed: 04/29/2015 Elsevier Interactive Patient Education  2017 Elsevier Inc.  Colonoscopy, Adult, Care After This sheet gives you information about how to care for yourself after your procedure. Your health care provider may also give you more specific instructions. If you have problems or questions, contact your health care provider. What can I expect after the procedure? After the procedure, it is common to have:  A small amount of blood in your  stool for 24 hours after the procedure.  Some gas.  Mild abdominal cramping or bloating.  Follow these instructions at home: General instructions   For the first 24 hours after the procedure: ? Do not drive or use machinery. ? Do not sign important documents. ? Do not drink alcohol. ? Do your regular daily activities at a slower pace than normal. ? Eat soft, easy-to-digest foods. ? Rest often.  Take over-the-counter or prescription medicines only as told by your health care provider.  It is up to you to get the results of your procedure. Ask your health care provider, or the department performing the procedure, when your results will be ready. Relieving  cramping and bloating  Try walking around when you have cramps or feel bloated.  Apply heat to your abdomen as told by your health care provider. Use a heat source that your health care provider recommends, such as a moist heat pack or a heating pad. ? Place a towel between your skin and the heat source. ? Leave the heat on for 20-30 minutes. ? Remove the heat if your skin turns bright red. This is especially important if you are unable to feel pain, heat, or cold. You may have a greater risk of getting burned. Eating and drinking  Drink enough fluid to keep your urine clear or pale yellow.  Resume your normal diet as instructed by your health care provider. Avoid heavy or fried foods that are hard to digest.  Avoid drinking alcohol for as long as instructed by your health care provider. Contact a health care provider if:  You have blood in your stool 2-3 days after the procedure. Get help right away if:  You have more than a small spotting of blood in your stool.  You pass large blood clots in your stool.  Your abdomen is swollen.  You have nausea or vomiting.  You have a fever.  You have increasing abdominal pain that is not relieved with medicine. This information is not intended to replace advice given to you by  your health care provider. Make sure you discuss any questions you have with your health care provider. Document Released: 09/30/2003 Document Revised: 11/10/2015 Document Reviewed: 04/29/2015 Elsevier Interactive Patient Education  2018 [place] Anesthesia is a term that refers to techniques, procedures, and medicines that help a person stay safe and comfortable during a medical procedure. Monitored anesthesia care, or sedation, is one type of anesthesia. Your anesthesia specialist may recommend sedation if you will be having a procedure that does not require you to be unconscious, such as:  Cataract surgery.  A dental procedure.  A biopsy.  A colonoscopy.  During the procedure, you may receive a medicine to help you relax (sedative). There are three levels of sedation:  Mild sedation. At this level, you may feel awake and relaxed. You will be able to follow directions.  Moderate sedation. At this level, you will be sleepy. You may not remember the procedure.  Deep sedation. At this level, you will be asleep. You will not remember the procedure.  The more medicine you are given, the deeper your level of sedation will be. Depending on how you respond to the procedure, the anesthesia specialist may change your level of sedation or the type of anesthesia to fit your needs. An anesthesia specialist will monitor you closely during the procedure. Let your health care provider know about:  Any allergies you have.  All medicines you are taking, including vitamins, herbs, eye drops, creams, and over-the-counter medicines.  Any use of steroids (by mouth or as a cream).  Any problems you or family members have had with sedatives and anesthetic medicines.  Any blood disorders you have.  Any surgeries you have had.  Any medical conditions you have, such as sleep apnea.  Whether you are pregnant or may be pregnant.  Any use of cigarettes, alcohol, or  street drugs. What are the risks? Generally, this is a safe procedure. However, problems may occur, including:  Getting too much medicine (oversedation).  Nausea.  Allergic reaction to medicines.  Trouble breathing. If this happens, a breathing tube may be used to help  with breathing. It will be removed when you are awake and breathing on your own.  Heart trouble.  Lung trouble.  Before the procedure Staying hydrated Follow instructions from your health care provider about hydration, which may include:  Up to 2 hours before the procedure - you may continue to drink clear liquids, such as water, clear fruit juice, black coffee, and plain tea.  Eating and drinking restrictions Follow instructions from your health care provider about eating and drinking, which may include:  8 hours before the procedure - stop eating heavy meals or foods such as meat, fried foods, or fatty foods.  6 hours before the procedure - stop eating light meals or foods, such as toast or cereal.  6 hours before the procedure - stop drinking milk or drinks that contain milk.  2 hours before the procedure - stop drinking clear liquids.  Medicines Ask your health care provider about:  Changing or stopping your regular medicines. This is especially important if you are taking diabetes medicines or blood thinners.  Taking medicines such as aspirin and ibuprofen. These medicines can thin your blood. Do not take these medicines before your procedure if your health care provider instructs you not to.  Tests and exams  You will have a physical exam.  You may have blood tests done to show: ? How well your kidneys and liver are working. ? How well your blood can clot.  General instructions  Plan to have someone take you home from the hospital or clinic.  If you will be going home right after the procedure, plan to have someone with you for 24 hours.  What happens during the procedure?  Your blood  pressure, heart rate, breathing, level of pain and overall condition will be monitored.  An IV tube will be inserted into one of your veins.  Your anesthesia specialist will give you medicines as needed to keep you comfortable during the procedure. This may mean changing the level of sedation.  The procedure will be performed. After the procedure  Your blood pressure, heart rate, breathing rate, and blood oxygen level will be monitored until the medicines you were given have worn off.  Do not drive for 24 hours if you received a sedative.  You may: ? Feel sleepy, clumsy, or nauseous. ? Feel forgetful about what happened after the procedure. ? Have a sore throat if you had a breathing tube during the procedure. ? Vomit. This information is not intended to replace advice given to you by your health care provider. Make sure you discuss any questions you have with your health care provider. Document Released: 11/11/2004 Document Revised: 07/25/2015 Document Reviewed: 06/08/2015 Elsevier Interactive Patient Education  2018 [place], Care After These instructions provide you with information about caring for yourself after your procedure. Your health care provider may also give you more specific instructions. Your treatment has been planned according to current medical practices, but problems sometimes occur. Call your health care provider if you have any problems or questions after your procedure. What can I expect after the procedure? After your procedure, it is common to:  Feel sleepy for several hours.  Feel clumsy and have poor balance for several hours.  Feel forgetful about what happened after the procedure.  Have poor judgment for several hours.  Feel nauseous or vomit.  Have a sore throat if you had a breathing tube during the procedure.  Follow these instructions at home: For at least 24 hours  after the procedure:   Do not: ? Participate  in activities in which you could fall or become injured. ? Drive. ? Use heavy machinery. ? Drink alcohol. ? Take sleeping pills or medicines that cause drowsiness. ? Make important decisions or sign legal documents. ? Take care of children on your own.  Rest. Eating and drinking  Follow the diet that is recommended by your health care provider.  If you vomit, drink water, juice, or soup when you can drink without vomiting.  Make sure you have little or no nausea before eating solid foods. General instructions  Have a responsible adult stay with you until you are awake and alert.  Take over-the-counter and prescription medicines only as told by your health care provider.  If you smoke, do not smoke without supervision.  Keep all follow-up visits as told by your health care provider. This is important. Contact a health care provider if:  You keep feeling nauseous or you keep vomiting.  You feel light-headed.  You develop a rash.  You have a fever. Get help right away if:  You have trouble breathing. This information is not intended to replace advice given to you by your health care provider. Make sure you discuss any questions you have with your health care provider. Document Released: 06/08/2015 Document Revised: 10/08/2015 Document Reviewed: 06/08/2015 Elsevier Interactive Patient Education  [employer].

## 2017-11-30 ENCOUNTER — Ambulatory Visit (INDEPENDENT_AMBULATORY_CARE_PROVIDER_SITE_OTHER): Payer: Medicare Other

## 2017-11-30 ENCOUNTER — Encounter: Payer: Self-pay | Admitting: Family Medicine

## 2017-11-30 ENCOUNTER — Ambulatory Visit (INDEPENDENT_AMBULATORY_CARE_PROVIDER_SITE_OTHER): Payer: Medicare Other | Admitting: Family Medicine

## 2017-11-30 VITALS — BP 101/67 | HR 120 | Temp 96.7°F | Ht 62.0 in | Wt 150.0 lb

## 2017-11-30 DIAGNOSIS — R1032 Left lower quadrant pain: Secondary | ICD-10-CM | POA: Diagnosis not present

## 2017-11-30 DIAGNOSIS — K5732 Diverticulitis of large intestine without perforation or abscess without bleeding: Secondary | ICD-10-CM

## 2017-11-30 DIAGNOSIS — R1084 Generalized abdominal pain: Secondary | ICD-10-CM

## 2017-11-30 LAB — URINALYSIS, COMPLETE
BILIRUBIN UA: NEGATIVE
GLUCOSE, UA: NEGATIVE
KETONES UA: NEGATIVE
NITRITE UA: NEGATIVE
RBC UA: NEGATIVE
SPEC GRAV UA: 1.01 (ref 1.005–1.030)
Urobilinogen, Ur: 0.2 mg/dL (ref 0.2–1.0)
pH, UA: 6 (ref 5.0–7.5)

## 2017-11-30 LAB — MICROSCOPIC EXAMINATION: RBC, UA: NONE SEEN /hpf (ref 0–2)

## 2017-11-30 MED ORDER — CIPROFLOXACIN HCL 500 MG PO TABS: 500.0000 mg | ORAL_TABLET | Freq: Two times a day (BID) | ORAL | 0 refills | Status: DC

## 2017-11-30 MED ORDER — METRONIDAZOLE 500 MG PO TABS: 500.0000 mg | ORAL_TABLET | Freq: Two times a day (BID) | ORAL | 0 refills | Status: DC

## 2017-11-30 NOTE — Progress Notes (Signed)
Subjective:  Patient ID: Teresa [name], female    DOB: 21-Oct-1955  Age: 62 y.o. MRN: [id number]  CC: possible diverticulitis and Place on back of right thigh   HPI Teresa [name] presents for left-sided lower abdominal pain.  Present 2 to 3 days.  Pain is moderate.  It is a crampy sensation.  History of diverticulitis in the past.  She has had some loose stools and noticed some blood but this is been minuscule.  She has a decreased appetite but no nausea or vomiting.  Depression screen [facility] 2/9 11/23/2017 08/23/2017 08/04/2017  Decreased Interest 0 0 0  Down, Depressed, Hopeless 0 0 0  PHQ - 2 Score 0 0 0  Altered sleeping - - -  Tired, decreased energy - - -  Change in appetite - - -  Feeling bad or failure about yourself  - - -  Trouble concentrating - - -  Moving slowly or fidgety/restless - - -  Suicidal thoughts - - -  PHQ-9 Score - - -  Difficult doing work/chores - - -  Some recent data might be hidden    History Teresa [name] has a past medical history of Anxiety state, unspecified, Asthma, Chronic back pain greater than 3 months duration, Colon polyp, Degeneration of intervertebral disc, site unspecified, Depressive disorder, not elsewhere classified, Diverticulitis, Diverticulosis (07-08-2005), Esophageal stricture (2014), Full dentures, GERD (gastroesophageal reflux disease) (07-08-2005), Hiatal hernia (07-08-2005), Insomnia, Irritable bowel syndrome, Migraine, Muscle spasm, Osteoarthrosis, unspecified whether generalized or localized, lower leg, Papilloma of breast (09/2011), Seasonal allergies, Seizures ([place]) (1990s), and Vocal cord polyp.   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   Her family history includes Diabetes in her maternal grandmother; Heart disease in her paternal grandmother; Irritable bowel syndrome in her unknown relative; Prostate cancer in her father; Sleep apnea in her father; Stomach cancer in her maternal uncle; Stroke in her maternal grandfather and maternal grandmother.She reports that she quit smoking about 26 years ago. She has never used smokeless tobacco. She reports that she has current or past drug history. She reports that she does not drink alcohol.    ROS Review of Systems  Constitutional: Negative.   HENT: Negative.   Eyes: Negative for visual disturbance.  Respiratory: Negative for shortness of breath.   Cardiovascular: Negative for chest pain.  Gastrointestinal: Positive for abdominal distention, abdominal pain, blood in stool and nausea. Negative for diarrhea.  Musculoskeletal: Negative for arthralgias.    Objective:  BP 101/67   Pulse (!) 120   Temp (!) 96.7 F (35.9 C) (Oral)   Ht 5\' 2"  (1.575 m)   Wt 150 lb (68 kg)   BMI 27.44 kg/m   BP Readings from Last 3 Encounters:  11/30/17 101/67  11/23/17 128/77  10/18/17 (!) 150/80    Wt Readings from Last 3 Encounters:  11/30/17 150 lb (68 kg)  11/23/17 152 lb 12.8 oz (69.3 kg)  10/18/17 150 lb 12.8 oz (68.4 kg)     Physical Exam  Constitutional: She is oriented to person, place, and time. She appears well-developed and well-nourished. No distress.  HENT:  Head: Normocephalic and atraumatic.  Right Ear: External ear normal.  Left Ear: External ear normal.  Nose: Nose normal.  Mouth/Throat: Oropharynx is clear and moist.  Eyes: Pupils are equal, round, and reactive to light. Conjunctivae and EOM are normal.  Neck: Normal range of motion. Neck supple. No thyromegaly present.  Cardiovascular: Normal rate, regular rhythm and normal heart sounds.  No murmur heard. Pulmonary/Chest: Effort  normal and breath sounds normal. No respiratory distress. She has no wheezes. She has no rales.  Abdominal: Soft. Bowel sounds are normal. She exhibits no distension and no mass. There is tenderness (Left lower quadrant). There is no rebound and no guarding.  Lymphadenopathy:    She has no cervical adenopathy.  Neurological: She is alert and oriented to person, place, and time. She has normal reflexes.  Skin: Skin is warm and dry.  Psychiatric: She has a normal mood and affect.      Assessment & Plan:   Teresa [name] was seen today for possible diverticulitis and place on back of right thigh.  Diagnoses and all orders for this visit:  Diverticulitis of colon  Generalized abdominal pain -     DG Abd 2 Views; Future -     Urinalysis, Complete  LLQ abdominal pain  Other orders -     ciprofloxacin (CIPRO) 500 MG tablet; Take 1 tablet (500 mg total) by mouth 2 (two) times daily. -     metroNIDAZOLE (FLAGYL) 500 MG tablet; Take 1 tablet (500 mg total) by mouth 2 (two) times daily. -     Microscopic Examination       I have discontinued Teresa [name]'s fluticasone. I am also having her maintain her docusate sodium, Probiotic Product (PROBIOTIC DAILY PO), cyclobenzaprine, diazepam, Olopatadine HCl, ondansetron, DEXILANT, buprenorphine, lisinopril, buPROPion, PROAIR HFA, polyethylene glycol powder, SYMBICORT, montelukast, diclofenac sodium, DULoxetine, ZTLIDO, oxymorphone, mometasone, cetirizine, ciprofloxacin, and metroNIDAZOLE.  Allergies as of 11/30/2017      Reactions   Lyrica [pregabalin] Shortness Of Breath   Trazodone And Nefazodone Shortness Of Breath   Amitriptyline    Arthrotec [diclofenac-misoprostol] Other (See Comments)   Upset stomach   Keflex [cephalexin] Other (See Comments)   Unknown reaction   Naproxen Other (See Comments)   Irritates stomach   Prozac [fluoxetine Hcl] Other (See Comments)   "makes me feel bad"   Remeron [mirtazapine] Other (See Comments)   "off  balance"   Zolpidem Other (See Comments)   Other   Ambien [zolpidem Tartrate] Other (See Comments)   COULDN'T FUNCTION, STAYED SLEEPY      Medication List        Accurate as of 11/30/17  8:10 PM. Always use your most recent med list.          buprenorphine 10 MCG/HR Ptwk patch Commonly known as:  BUTRANS - dosed mcg/hr 20 mcg.   buPROPion 150 MG 24 hr tablet Commonly known as:  WELLBUTRIN XL TAKE ONE (1) TABLET EACH DAY   cetirizine 10 MG tablet Commonly known as:  ZYRTEC TAKE ONE TABLET EVERY MORNING   ciprofloxacin 500 MG tablet Commonly known as:  CIPRO Take 1 tablet (500 mg total) by mouth 2 (two) times daily.   cyclobenzaprine 10 MG tablet Commonly known as:  FLEXERIL 2 (two) times daily after a meal.   DEXILANT 60 MG capsule Generic drug:  dexlansoprazole TAKE ONE (1) CAPSULE EACH DAY   diazepam 5 MG tablet Commonly known as:  VALIUM Take 5 mg by mouth daily.   diclofenac sodium 1 % Gel Commonly known as:  VOLTAREN   docusate sodium 100 MG capsule Commonly known as:  COLACE Take 100-200 mg by mouth daily as needed  for mild constipation.   DULoxetine 30 MG capsule Commonly known as:  CYMBALTA   lisinopril 10 MG tablet Commonly known as:  PRINIVIL,ZESTRIL TAKE ONE (1) TABLET EACH DAY   metroNIDAZOLE 500 MG tablet Commonly known as:  FLAGYL Take 1 tablet (500 mg total) by mouth 2 (two) times daily.   mometasone 50 MCG/ACT nasal spray Commonly known as:  NASONEX USE 2 SPRAYS IN EACH NOSTRIL ONCE DAILY   montelukast 10 MG tablet Commonly known as:  SINGULAIR TAKE ONE (1) TABLET EACH DAY   Olopatadine HCl 0.2 % Soln Place 1 drop into both eyes daily as needed for irritation.   ondansetron 4 MG tablet Commonly known as:  ZOFRAN   oxymorphone 10 MG tablet Commonly known as:  OPANA   polyethylene glycol powder powder Commonly known as:  GLYCOLAX/MIRALAX MIX 17 GRAMS INTO 8OZ OF WATER AND DRINK DAILY   PROAIR HFA 108 (90 Base) MCG/ACT  inhaler Generic drug:  albuterol USE 1 TO 2 PUFFS EVERY 4 TO 6 HOURS AS NEEDED   PROBIOTIC DAILY PO Take by mouth.   SYMBICORT 160-4.5 MCG/ACT inhaler Generic drug:  budesonide-formoterol USE 2 PUFFS TWICE DAILY   ZTLIDO 1.8 % Ptch Generic drug:  Lidocaine        Follow-up: Return if symptoms worsen or fail to improve.  Claretta Fraise, M.D.

## 2017-12-01 ENCOUNTER — Telehealth (INDEPENDENT_AMBULATORY_CARE_PROVIDER_SITE_OTHER): Payer: Self-pay | Admitting: *Deleted

## 2017-12-01 ENCOUNTER — Encounter (HOSPITAL_COMMUNITY)
Admission: RE | Admit: 2017-12-01 | Discharge: 2017-12-01 | Disposition: A | Discharge status: Home / Self Care | Payer: Medicare Other | Source: Ambulatory Visit | Attending: Internal Medicine | Admitting: Internal Medicine

## 2017-12-01 ENCOUNTER — Encounter (HOSPITAL_COMMUNITY): Payer: Self-pay

## 2017-12-01 NOTE — Telephone Encounter (Signed)
noted 

## 2017-12-01 NOTE — Telephone Encounter (Signed)
Patient called -- she is cancelling TCS sch'd 12/09/17 -- her grand daughter is real sick and they are headed to [place] -- she will call me back to resch once everything is settled from that

## 2017-12-09 ENCOUNTER — Ambulatory Visit (HOSPITAL_COMMUNITY): Admission: RE | Admit: 2017-12-09 | Payer: Medicare Other | Source: Ambulatory Visit | Admitting: Internal Medicine

## 2017-12-09 ENCOUNTER — Encounter (HOSPITAL_COMMUNITY): Admission: RE | Payer: Self-pay | Source: Ambulatory Visit

## 2017-12-09 SURGERY — COLONOSCOPY WITH PROPOFOL
Anesthesia: Monitor Anesthesia Care

## 2017-12-18 DIAGNOSIS — R0789 Other chest pain: Secondary | ICD-10-CM | POA: Diagnosis not present

## 2017-12-18 DIAGNOSIS — M545 Low back pain: Secondary | ICD-10-CM | POA: Diagnosis not present

## 2017-12-18 DIAGNOSIS — R52 Pain, unspecified: Secondary | ICD-10-CM | POA: Diagnosis not present

## 2017-12-18 DIAGNOSIS — G8929 Other chronic pain: Secondary | ICD-10-CM | POA: Diagnosis not present

## 2017-12-18 DIAGNOSIS — R110 Nausea: Secondary | ICD-10-CM | POA: Diagnosis not present

## 2017-12-18 DIAGNOSIS — Z87891 Personal history of nicotine dependence: Secondary | ICD-10-CM | POA: Diagnosis not present

## 2017-12-18 DIAGNOSIS — M5489 Other dorsalgia: Secondary | ICD-10-CM | POA: Diagnosis not present

## 2017-12-18 DIAGNOSIS — R0689 Other abnormalities of breathing: Secondary | ICD-10-CM | POA: Diagnosis not present

## 2017-12-18 DIAGNOSIS — R079 Chest pain, unspecified: Secondary | ICD-10-CM | POA: Diagnosis not present

## 2017-12-18 DIAGNOSIS — Z79899 Other long term (current) drug therapy: Secondary | ICD-10-CM | POA: Diagnosis not present

## 2017-12-18 DIAGNOSIS — J45909 Unspecified asthma, uncomplicated: Secondary | ICD-10-CM | POA: Diagnosis not present

## 2017-12-20 ENCOUNTER — Other Ambulatory Visit: Payer: Self-pay

## 2017-12-20 ENCOUNTER — Ambulatory Visit (INDEPENDENT_AMBULATORY_CARE_PROVIDER_SITE_OTHER): Payer: Medicare Other | Admitting: Family Medicine

## 2017-12-20 ENCOUNTER — Encounter: Payer: Self-pay | Admitting: Family Medicine

## 2017-12-20 VITALS — BP 104/60 | HR 97 | Temp 96.2°F | Ht 62.0 in | Wt 149.0 lb

## 2017-12-20 DIAGNOSIS — Z23 Encounter for immunization: Secondary | ICD-10-CM | POA: Diagnosis not present

## 2017-12-20 DIAGNOSIS — R079 Chest pain, unspecified: Secondary | ICD-10-CM

## 2017-12-20 DIAGNOSIS — I10 Essential (primary) hypertension: Secondary | ICD-10-CM | POA: Diagnosis not present

## 2017-12-20 MED ORDER — BUPROPION HCL ER (XL) 150 MG PO TB24: ORAL_TABLET | ORAL | 0 refills | Status: DC

## 2017-12-20 NOTE — Patient Instructions (Signed)
Follow up with Dr Dettinger in 4 weeks for recheck of your blood pressure.

## 2017-12-20 NOTE — Progress Notes (Signed)
Subjective: CC: f/u ED visit PCP: Dettinger, Fransisca Kaufmann, MD GGY:IRSWN Teresa [name] is a 62 y.o. female presenting to clinic today for:  1. F/U ED visit for chest and back pain Patient was seen in the emergency department 2 days ago for chest pressure with associated nausea and shortness of breath.  She reports that they did x-rays, did an EKG and did labs and treated her symptoms with Toradol and sent her home.  She has noted that symptoms seem to be getting a little bit better and that she is breathing better today.  Denies any orthopnea, lower extremity edema.   ROS: Per HPI  Allergies  Allergen Reactions  . Lyrica [Pregabalin] Shortness Of Breath  . Trazodone And Nefazodone Shortness Of Breath  . Amitriptyline   . Arthrotec [Diclofenac-Misoprostol] Other (See Comments)    Upset stomach  . Keflex [Cephalexin] Other (See Comments)    Unknown reaction  . Naproxen Other (See Comments)    Irritates stomach  . Prozac [Fluoxetine Hcl] Other (See Comments)    "makes me feel bad"  . Remeron [Mirtazapine] Other (See Comments)    "off balance"  . Zolpidem Other (See Comments)    Other  . Ambien [Zolpidem Tartrate] Other (See Comments)    COULDN'T FUNCTION, STAYED SLEEPY   Past Medical History:  Diagnosis Date  . Anxiety state, unspecified   . Asthma    prn inhaler  . Chronic back pain greater than 3 months duration   . Colon polyp   . Degeneration of intervertebral disc, site unspecified   . Depressive disorder, not elsewhere classified   . Diverticulitis   . Diverticulosis 07-08-2005   colonoscopy  . Esophageal stricture 2014  . Full dentures   . GERD (gastroesophageal reflux disease) 07-08-2005   EGD  . Hiatal hernia 07-08-2005   EGD  . Insomnia   . Irritable bowel syndrome   . Migraine   . Muscle spasm   . Osteoarthrosis, unspecified whether generalized or localized, lower leg   . Papilloma of breast 09/2011   left  . Seasonal allergies   . Seizures ([place]) 1990s   x 1,  unknown cause; no seizures since  . Vocal cord polyp    history of    Current Outpatient Medications:  .  buprenorphine (BUTRANS - DOSED MCG/HR) 10 MCG/HR PTWK patch, 20 mcg. , Disp: , Rfl:  .  buPROPion (WELLBUTRIN XL) 150 MG 24 hr tablet, TAKE ONE (1) TABLET EACH DAY, Disp: 90 tablet, Rfl: 0 .  cetirizine (ZYRTEC) 10 MG tablet, TAKE ONE TABLET EVERY MORNING, Disp: 30 tablet, Rfl: 5 .  cyclobenzaprine (FLEXERIL) 10 MG tablet, 2 (two) times daily after a meal. , Disp: , Rfl:  .  DEXILANT 60 MG capsule, TAKE ONE (1) CAPSULE EACH DAY, Disp: 90 capsule, Rfl: 1 .  diazepam (VALIUM) 5 MG tablet, Take 5 mg by mouth daily. , Disp: , Rfl:  .  diclofenac sodium (VOLTAREN) 1 % GEL, , Disp: , Rfl:  .  docusate sodium (COLACE) 100 MG capsule, Take 100-200 mg by mouth daily as needed for mild constipation. , Disp: , Rfl:  .  DULoxetine (CYMBALTA) 30 MG capsule, , Disp: , Rfl:  .  lisinopril (PRINIVIL,ZESTRIL) 10 MG tablet, TAKE ONE (1) TABLET EACH DAY, Disp: 90 tablet, Rfl: 1 .  mometasone (NASONEX) 50 MCG/ACT nasal spray, USE 2 SPRAYS IN EACH NOSTRIL ONCE DAILY, Disp: 51 g, Rfl: 2 .  montelukast (SINGULAIR) 10 MG tablet, TAKE ONE (1) TABLET  EACH DAY, Disp: 90 tablet, Rfl: 0 .  Olopatadine HCl 0.2 % SOLN, Place 1 drop into both eyes daily as needed for irritation., Disp: , Rfl:  .  ondansetron (ZOFRAN) 4 MG tablet, , Disp: , Rfl:  .  oxymorphone (OPANA) 10 MG tablet, , Disp: , Rfl:  .  polyethylene glycol powder (GLYCOLAX/MIRALAX) powder, MIX 17 GRAMS INTO 8OZ OF WATER AND DRINK DAILY, Disp: 527 g, Rfl: 4 .  PROAIR HFA 108 (90 Base) MCG/ACT inhaler, USE 1 TO 2 PUFFS EVERY 4 TO 6 HOURS AS NEEDED, Disp: 25.5 g, Rfl: 4 .  Probiotic Product (PROBIOTIC DAILY PO), Take by mouth., Disp: , Rfl:  .  SYMBICORT 160-4.5 MCG/ACT inhaler, USE 2 PUFFS TWICE DAILY, Disp: 10.2 g, Rfl: 3 .  ZTLIDO 1.8 % PTCH, , Disp: , Rfl:  Social History   Socioeconomic History  . Marital status: Divorced    Spouse name: Not on  file  . Number of children: 1  . Years of education: Not on file  . Highest education level: Not on file  Occupational History  . Occupation: Disabled  Social Needs  . Financial resource strain: Somewhat hard  . Food insecurity:    Worry: Sometimes true    Inability: Sometimes true  . Transportation needs:    Medical: No    Non-medical: No  Tobacco Use  . Smoking status: Former Smoker    Last attempt to quit: 08/20/1991    Years since quitting: 26.3  . Smokeless tobacco: Never Used  Substance and Sexual Activity  . Alcohol use: No    Alcohol/week: 0.0 standard drinks    Comment: quit 23 yrs ago, 08-24-2016 per pt no  . Drug use: Yes    Comment: quit 23 yrs ago. Cocaine and marijuana, 08-24-2016 per pt 23 yrs ago  . Sexual activity: Never  Lifestyle  . Physical activity:    Days per week: 0 days    Minutes per session: Not on file  . Stress: Only a little  Relationships  . Social connections:    Talks on phone: More than three times a week    Gets together: More than three times a week    Attends religious service: More than 4 times per year    Active member of club or organization: Yes    Attends meetings of clubs or organizations: More than 4 times per year    Relationship status: Divorced  . Intimate partner violence:    Fear of current or ex partner: No    Emotionally abused: No    Physically abused: No    Forced sexual activity: No  Other Topics Concern  . Not on file  Social History Narrative   Lives in an apartment. Her 47 year old granddaughter lives with her. Her granddaughter does not work and sometimes it is difficulty to stretch her food stamps out to feed both of them.   Family History  Problem Relation Age of Onset  . Prostate cancer Father   . Sleep apnea Father   . Stomach cancer Maternal Uncle   . Diabetes Maternal Grandmother   . Stroke Maternal Grandmother   . Heart disease Paternal Grandmother   . Irritable bowel syndrome Unknown   . Stroke  Maternal Grandfather     Objective: Office vital signs reviewed. BP 104/60   Pulse 97   Temp (!) 96.2 F (35.7 C) (Oral)   Ht 5\' 2"  (1.575 m)   Wt 149 lb (67.6 kg)  BMI 27.25 kg/m   Physical Examination:  General: Awake, alert, chronically ill appearing female, No acute distress HEENT: Normal, sclera white Cardio: regular rate and rhythm, S1S2 heard, no murmurs appreciated Pulm: clear to auscultation bilaterally, no wheezes, rhonchi or rales; normal work of breathing on room air Extremities: warm, well perfused, No edema, cyanosis or clubbing; +2 pulses bilaterally  Assessment/ Plan: 62 y.o. female   1. Chest pain, unspecified type I have placed a referral to cardiology for consideration of stress testing in this patient with risk factor of hypertension and recent chest pain.  I reviewed her emergency department course and labs.  She had ACS rule out in the emergency department with 2- troponins.  Labs were remarkable for slightly elevated anion gap.  Blood sugar was within normal limits as well as her electrolytes and hemoglobin level.  No elevation in white blood cell count.  X-ray was a one-view portable and did not demonstrate any cardiopulmonary disease per the report.  Personal review of EKG did not demonstrate any ST elevations or depressions.  No T wave inversions or other signs to suggest ischemic processes.  These documents have been scanned into the chart.  I have recommended that she follow-up with Dr. Warrick Parisian for ongoing chronic care needs.  Reasons for emergent evaluation the emergency department discussed.  Patient was good understanding of follow-up PRN. - Ambulatory referral to Cardiology  2. Essential hypertension Initial blood pressure seemed somewhat low.  This was an automated blood pressure of 88/56.  Manual recheck was 104/60.  May need to consider reduction of lisinopril if she has persistently borderline blood pressures.  She is on Valium, Butrans and Opana  which may be contributing to borderline blood pressures.   Orders Placed This Encounter  Procedures  . Ambulatory referral to Cardiology    Referral Priority:   Routine    Referral Type:   Consultation    Referral Reason:   Specialty Services Required    Requested Specialty:   Cardiology    Number of Visits Requested:   1   No orders of the defined types were placed in this encounter.    Janora Norlander, DO [place] [phone]

## 2018-01-04 ENCOUNTER — Ambulatory Visit: Payer: Medicare Other | Admitting: Family Medicine

## 2018-01-06 ENCOUNTER — Telehealth: Payer: Self-pay | Admitting: Family Medicine

## 2018-01-06 MED ORDER — METRONIDAZOLE 500 MG PO TABS: 500.0000 mg | ORAL_TABLET | Freq: Two times a day (BID) | ORAL | 0 refills | Status: DC

## 2018-01-06 MED ORDER — CIPROFLOXACIN HCL 500 MG PO TABS: 500.0000 mg | ORAL_TABLET | Freq: Two times a day (BID) | ORAL | 0 refills | Status: DC

## 2018-01-06 NOTE — Telephone Encounter (Signed)
PT is wanting to know if we can [facility], her Diverticulitis is acting up again

## 2018-01-06 NOTE — Telephone Encounter (Signed)
Patient aware and verbalizes understanding. 

## 2018-01-06 NOTE — Telephone Encounter (Signed)
I sent in the medication for diverticulitis for, if it worsens have her come in and see [place]

## 2018-01-09 ENCOUNTER — Telehealth: Payer: Self-pay | Admitting: Family Medicine

## 2018-01-09 NOTE — Telephone Encounter (Signed)
appt scheduled to discuss meds Pt notified

## 2018-01-10 ENCOUNTER — Ambulatory Visit (INDEPENDENT_AMBULATORY_CARE_PROVIDER_SITE_OTHER): Payer: Medicare Other | Admitting: Family Medicine

## 2018-01-10 ENCOUNTER — Encounter: Payer: Self-pay | Admitting: Family Medicine

## 2018-01-10 VITALS — BP 124/79 | HR 95 | Temp 97.3°F | Ht 62.0 in | Wt 150.0 lb

## 2018-01-10 DIAGNOSIS — J4540 Moderate persistent asthma, uncomplicated: Secondary | ICD-10-CM

## 2018-01-10 DIAGNOSIS — F331 Major depressive disorder, recurrent, moderate: Secondary | ICD-10-CM

## 2018-01-10 MED ORDER — ALBUTEROL SULFATE (2.5 MG/3ML) 0.083% IN NEBU: 2.5000 mg | INHALATION_SOLUTION | Freq: Four times a day (QID) | RESPIRATORY_TRACT | 1 refills | Status: DC | PRN

## 2018-01-10 MED ORDER — BUPROPION HCL ER (XL) 300 MG PO TB24: ORAL_TABLET | ORAL | 1 refills | Status: DC

## 2018-01-10 NOTE — Progress Notes (Signed)
BP 124/79   Pulse 95   Temp (!) 97.3 F (36.3 C) (Oral)   Ht 5\' 2"  (1.575 m)   Wt 150 lb (68 kg)   BMI 27.44 kg/m    Subjective:    Patient ID: Teresa [name], female    DOB: 10/11/1955, 62 y.o.   MRN: [id number]  HPI: LAYA [name] is a 62 y.o. female presenting on 01/10/2018 for Depression (Patient the last week and a half she has had more depression than normal.  States that she has been having crying spells and been bitting everyones head off more than normal.  Also states that she has been sleeping during the day and staying awake at night.) and Hospitalization Follow-up (chest pain- Patient went to [facility]- Has apt to see Hochrine)   HPI Depression Patient is coming in today for depression recheck.  She says over the past week and a half she has had a lot more depression than normal she is been having a lot of crying spells and feels more irritable and has been biting everyone's head off.  She says that she is also been sleeping a lot more during the day and staying awake at night and unable to sleep.  She says she was in the ER for chest pain once and has hospital follow-up with cardiology but they were also concerned that this could be her anxiety as well.  She has been taking the Wellbutrin 150 and said that it was working initially but now she feels like is not working anymore and she would like to try and increase it.  She denies any suicidal ideations or thoughts of hurting herself. Depression screen [facility] 2/9 01/10/2018 12/20/2017 11/23/2017 08/23/2017 08/04/2017  Decreased Interest 3 0 0 0 0  Down, Depressed, Hopeless 2 0 0 0 0  PHQ - 2 Score 5 0 0 0 0  Altered sleeping 3 - - - -  Tired, decreased energy 3 - - - -  Change in appetite 1 - - - -  Feeling bad or failure about yourself  2 - - - -  Trouble concentrating 3 - - - -  Moving slowly or fidgety/restless 3 - - - -  Suicidal thoughts 0 - - - -  PHQ-9 Score 20 - - - -  Difficult doing work/chores - - - - -  Some recent  data might be hidden    Patient wants an albuterol nebulizer machine because she feels like it will be better for her breathing than the albuterol Pro Air that she has.  Relevant past medical, surgical, family and social history reviewed and updated as indicated. Interim medical history since our last visit reviewed. Allergies and medications reviewed and updated.  Review of Systems  Constitutional: Negative for chills and fever.  Eyes: Negative for visual disturbance.  Respiratory: Negative for chest tightness and shortness of breath.   Cardiovascular: Negative for chest pain and leg swelling.  Musculoskeletal: Negative for back pain and gait problem.  Skin: Negative for rash.  Neurological: Negative for light-headedness and headaches.  Psychiatric/Behavioral: Negative for agitation and behavioral problems.  All other systems reviewed and are negative.   Per HPI unless specifically indicated above       Objective:    BP 124/79   Pulse 95   Temp (!) 97.3 F (36.3 C) (Oral)   Ht 5\' 2"  (1.575 m)   Wt 150 lb (68 kg)   BMI 27.44 kg/m   Wt Readings from Last  3 Encounters:  01/10/18 150 lb (68 kg)  12/20/17 149 lb (67.6 kg)  11/30/17 150 lb (68 kg)    Physical Exam  Constitutional: She is oriented to person, place, and time. She appears well-developed and well-nourished. No distress.  Eyes: Conjunctivae are normal.  Cardiovascular: Normal rate, regular rhythm, normal heart sounds and intact distal pulses.  No murmur heard. Pulmonary/Chest: Effort normal and breath sounds normal. No stridor. No respiratory distress. She has no wheezes. She has no rales.  Musculoskeletal: Normal range of motion. She exhibits no edema.  Neurological: She is alert and oriented to person, place, and time. Coordination normal.  Skin: Skin is warm and dry. No rash noted. She is not diaphoretic.  Psychiatric: Her behavior is normal. Her mood appears anxious. She exhibits a depressed mood. She  expresses no suicidal ideation. She expresses no suicidal plans.  Nursing note and vitals reviewed.      Assessment & Plan:   Problem List Items Addressed This Visit      Respiratory   Asthma, chronic   Relevant Medications   albuterol (PROVENTIL) (2.5 MG/3ML) 0.083% nebulizer solution   Other Relevant Orders   DME Nebulizer machine     Other   Major depressive disorder, recurrent episode, moderate (HCC) - Primary   Relevant Medications   buPROPion (WELLBUTRIN XL) 300 MG 24 hr tablet      Follow up plan: Return in about 4 weeks (around 02/07/2018), or if symptoms worsen or fail to improve, for Depression recheck.  Counseling provided for all of the vaccine components Orders Placed This Encounter  Procedures  . DME Nebulizer machine    Caryl Pina, MD [facility] 01/10/2018, 4:29 PM

## 2018-01-12 DIAGNOSIS — J449 Chronic obstructive pulmonary disease, unspecified: Secondary | ICD-10-CM | POA: Diagnosis not present

## 2018-01-19 ENCOUNTER — Ambulatory Visit: Payer: Medicare Other | Admitting: *Deleted

## 2018-01-20 ENCOUNTER — Encounter: Payer: Self-pay | Admitting: Family Medicine

## 2018-01-20 ENCOUNTER — Ambulatory Visit (INDEPENDENT_AMBULATORY_CARE_PROVIDER_SITE_OTHER): Payer: Medicare Other | Admitting: Family Medicine

## 2018-01-20 VITALS — BP 109/70 | HR 120 | Temp 97.9°F | Ht 62.0 in | Wt 147.0 lb

## 2018-01-20 DIAGNOSIS — K581 Irritable bowel syndrome with constipation: Secondary | ICD-10-CM | POA: Diagnosis not present

## 2018-01-20 MED ORDER — LINACLOTIDE 72 MCG PO CAPS: 72.0000 ug | ORAL_CAPSULE | Freq: Every day | ORAL | 0 refills | Status: DC

## 2018-01-20 NOTE — Progress Notes (Signed)
Subjective: CC: abdominal pain PCP: Dettinger, Fransisca Kaufmann, MD MEQ:ASTMH Teresa [name] is a 62 y.o. female presenting to clinic today for:  1. Abdominal pain Patient reports generalized abdominal pain that has been ongoing for some time now.  She reports history of chronic constipation and IBS.  She has had a history of diverticulitis in the past as well.  She reports hard stools, straining.  Occasionally nauseated but states this is relieved by Zofran.  Denies any associated vomiting.  No hematochezia or melena.  She is tolerating p.o. intake without difficulty.  She notes that she is tried senna, Colace and MiraLAX.  None of these remedies seem to be helping.  She is of note on chronic opioids as well.   ROS: Per HPI  Allergies  Allergen Reactions  . Lyrica [Pregabalin] Shortness Of Breath  . Trazodone And Nefazodone Shortness Of Breath  . Amitriptyline   . Arthrotec [Diclofenac-Misoprostol] Other (See Comments)    Upset stomach  . Keflex [Cephalexin] Other (See Comments)    Unknown reaction  . Naproxen Other (See Comments)    Irritates stomach  . Prozac [Fluoxetine Hcl] Other (See Comments)    "makes me feel bad"  . Remeron [Mirtazapine] Other (See Comments)    "off balance"  . Zolpidem Other (See Comments)    Other  . Ambien [Zolpidem Tartrate] Other (See Comments)    COULDN'T FUNCTION, STAYED SLEEPY   Past Medical History:  Diagnosis Date  . Anxiety state, unspecified   . Asthma    prn inhaler  . Chronic back pain greater than 3 months duration   . Colon polyp   . Degeneration of intervertebral disc, site unspecified   . Depressive disorder, not elsewhere classified   . Diverticulitis   . Diverticulosis 07-08-2005   colonoscopy  . Esophageal stricture 2014  . Full dentures   . GERD (gastroesophageal reflux disease) 07-08-2005   EGD  . Hiatal hernia 07-08-2005   EGD  . Insomnia   . Irritable bowel syndrome   . Migraine   . Muscle spasm   . Osteoarthrosis, unspecified  whether generalized or localized, lower leg   . Papilloma of breast 09/2011   left  . Seasonal allergies   . Seizures ([place]) 1990s   x 1, unknown cause; no seizures since  . Vocal cord polyp    history of    Current Outpatient Medications:  .  albuterol (PROVENTIL) (2.5 MG/3ML) 0.083% nebulizer solution, Take 3 mLs (2.5 mg total) by nebulization every 6 (six) hours as needed for wheezing or shortness of breath., Disp: 150 mL, Rfl: 1 .  buprenorphine (BUTRANS - DOSED MCG/HR) 10 MCG/HR PTWK patch, 20 mcg. , Disp: , Rfl:  .  buPROPion (WELLBUTRIN XL) 300 MG 24 hr tablet, TAKE ONE (1) TABLET EACH DAY, Disp: 30 tablet, Rfl: 1 .  cetirizine (ZYRTEC) 10 MG tablet, TAKE ONE TABLET EVERY MORNING, Disp: 30 tablet, Rfl: 5 .  cyclobenzaprine (FLEXERIL) 10 MG tablet, 2 (two) times daily after a meal. , Disp: , Rfl:  .  DEXILANT 60 MG capsule, TAKE ONE (1) CAPSULE EACH DAY, Disp: 90 capsule, Rfl: 1 .  diazepam (VALIUM) 5 MG tablet, Take 5 mg by mouth daily. , Disp: , Rfl:  .  diclofenac sodium (VOLTAREN) 1 % GEL, , Disp: , Rfl:  .  docusate sodium (COLACE) 100 MG capsule, Take 100-200 mg by mouth daily as needed for mild constipation. , Disp: , Rfl:  .  lisinopril (PRINIVIL,ZESTRIL) 10 MG tablet,  TAKE ONE (1) TABLET EACH DAY, Disp: 90 tablet, Rfl: 1 .  MAGNESIUM PO, Take 15 mg by mouth., Disp: , Rfl:  .  mometasone (NASONEX) 50 MCG/ACT nasal spray, USE 2 SPRAYS IN EACH NOSTRIL ONCE DAILY, Disp: 51 g, Rfl: 2 .  montelukast (SINGULAIR) 10 MG tablet, TAKE ONE (1) TABLET EACH DAY, Disp: 90 tablet, Rfl: 0 .  Olopatadine HCl 0.2 % SOLN, Place 1 drop into both eyes daily as needed for irritation., Disp: , Rfl:  .  ondansetron (ZOFRAN) 4 MG tablet, , Disp: , Rfl:  .  oxymorphone (OPANA) 10 MG tablet, , Disp: , Rfl:  .  polyethylene glycol powder (GLYCOLAX/MIRALAX) powder, MIX 17 GRAMS INTO 8OZ OF WATER AND DRINK DAILY, Disp: 527 g, Rfl: 4 .  PROAIR HFA 108 (90 Base) MCG/ACT inhaler, USE 1 TO 2 PUFFS EVERY 4 TO 6  HOURS AS NEEDED, Disp: 25.5 g, Rfl: 4 .  Probiotic Product (PROBIOTIC DAILY PO), Take by mouth., Disp: , Rfl:  .  SYMBICORT 160-4.5 MCG/ACT inhaler, USE 2 PUFFS TWICE DAILY, Disp: 10.2 g, Rfl: 3 .  ZTLIDO 1.8 % PTCH, , Disp: , Rfl:  Social History   Socioeconomic History  . Marital status: Divorced    Spouse name: Not on file  . Number of children: 1  . Years of education: Not on file  . Highest education level: Not on file  Occupational History  . Occupation: Disabled  Social Needs  . Financial resource strain: Somewhat hard  . Food insecurity:    Worry: Sometimes true    Inability: Sometimes true  . Transportation needs:    Medical: No    Non-medical: No  Tobacco Use  . Smoking status: Former Smoker    Last attempt to quit: 08/20/1991    Years since quitting: 26.4  . Smokeless tobacco: Never Used  Substance and Sexual Activity  . Alcohol use: No    Alcohol/week: 0.0 standard drinks    Comment: quit 23 yrs ago, 08-24-2016 per pt no  . Drug use: Yes    Comment: quit 23 yrs ago. Cocaine and marijuana, 08-24-2016 per pt 23 yrs ago  . Sexual activity: Never  Lifestyle  . Physical activity:    Days per week: 0 days    Minutes per session: Not on file  . Stress: Only a little  Relationships  . Social connections:    Talks on phone: More than three times a week    Gets together: More than three times a week    Attends religious service: More than 4 times per year    Active member of club or organization: Yes    Attends meetings of clubs or organizations: More than 4 times per year    Relationship status: Divorced  . Intimate partner violence:    Fear of current or ex partner: No    Emotionally abused: No    Physically abused: No    Forced sexual activity: No  Other Topics Concern  . Not on file  Social History Narrative   Lives in an apartment. Her 50 year old granddaughter lives with her. Her granddaughter does not work and sometimes it is difficulty to stretch her food  stamps out to feed both of them.   Family History  Problem Relation Age of Onset  . Prostate cancer Father   . Sleep apnea Father   . Stomach cancer Maternal Uncle   . Diabetes Maternal Grandmother   . Stroke Maternal Grandmother   .  Heart disease Paternal Grandmother   . Irritable bowel syndrome Unknown   . Stroke Maternal Grandfather     Objective: Office vital signs reviewed. BP 109/70   Pulse (!) 120   Temp 97.9 F (36.6 C) (Oral)   Ht 5\' 2"  (1.575 m)   Wt 147 lb (66.7 kg)   BMI 26.89 kg/m   Physical Examination:  General: Awake, alert, nontoxic, No acute distress GI: soft, mild generalized tenderness to palpation that is worse in the lower abdomen.  No peritoneal signs.  Non-distended, bowel sounds present x4, no hepatomegaly, no splenomegaly, no masses  Assessment/ Plan: 62 y.o. female   1. Irritable bowel syndrome with constipation I suspect constipation in this patient.  No evidence of acute abdomen on exam.  Certainly no evidence of infection.  I have given her 2 weeks of Linzess samples.  Instructions were reviewed with the patient.  Reasons for return discussed.  She will follow with PCP in 2 weeks for recheck.  Meds ordered this encounter  Medications  . linaclotide (LINZESS) 72 MCG capsule    Sig: Take 1-2 capsules (72-144 mcg total) by mouth daily before breakfast.    Dispense:  16 capsule    Refill:  0     Ashly Windell Moulding, DO [place] [phone]

## 2018-01-20 NOTE — Patient Instructions (Signed)
I have prescribed the Linzess to take every morning with breakfast:   Start with 1 capsule every morning with breakfast.  If you have a normal, non-strenuous, soft stool.  You may continue with just 1 capsule every morning.     If you still have hard stools or or not having a bowel movement every day, you can increase to 2 capsules every morning with breakfast.  Follow up with Dr Warrick Parisian in 2 weeks for recheck.

## 2018-01-23 ENCOUNTER — Encounter: Payer: Medicare Other | Admitting: *Deleted

## 2018-01-23 DIAGNOSIS — G5600 Carpal tunnel syndrome, unspecified upper limb: Secondary | ICD-10-CM | POA: Diagnosis not present

## 2018-01-23 DIAGNOSIS — M25569 Pain in unspecified knee: Secondary | ICD-10-CM | POA: Diagnosis not present

## 2018-01-23 DIAGNOSIS — Z79891 Long term (current) use of opiate analgesic: Secondary | ICD-10-CM | POA: Diagnosis not present

## 2018-01-23 DIAGNOSIS — M545 Low back pain: Secondary | ICD-10-CM | POA: Diagnosis not present

## 2018-01-24 ENCOUNTER — Ambulatory Visit (INDEPENDENT_AMBULATORY_CARE_PROVIDER_SITE_OTHER): Payer: Medicare Other | Admitting: *Deleted

## 2018-01-24 ENCOUNTER — Ambulatory Visit (INDEPENDENT_AMBULATORY_CARE_PROVIDER_SITE_OTHER): Payer: Medicare Other

## 2018-01-24 VITALS — BP 108/69 | HR 92 | Temp 97.2°F | Ht 62.0 in | Wt 146.4 lb

## 2018-01-24 DIAGNOSIS — Z780 Asymptomatic menopausal state: Secondary | ICD-10-CM

## 2018-01-24 DIAGNOSIS — Z0000 Encounter for general adult medical examination without abnormal findings: Secondary | ICD-10-CM | POA: Diagnosis not present

## 2018-01-24 DIAGNOSIS — Z1231 Encounter for screening mammogram for malignant neoplasm of breast: Secondary | ICD-10-CM | POA: Diagnosis not present

## 2018-01-24 LAB — HM MAMMOGRAPHY

## 2018-01-24 NOTE — Patient Instructions (Signed)
Call and schedule Colonoscopy with GI.   Teresa [name] , Thank you for taking time to come for your Medicare Wellness Visit. I appreciate your ongoing commitment to your health goals. Please review the following plan we discussed and let me know if I can assist you in the future.   These are the goals we discussed: Goals    . Exercise 3x per week (30 min per time)     Try to exercise for at least 30 minutes, 3 times weekly.       This is a list of the screening recommended for you and due dates:  Health Maintenance  Topic Date Due  . DEXA scan (bone density measurement)  01/25/2019*  . Colon Cancer Screening  01/25/2019*  . Pap Smear  05/01/2018  . Mammogram  10/28/2018  . Tetanus Vaccine  01/26/2026  . Flu Shot  Completed  .  Hepatitis C: One time screening is recommended by Center for Disease Control  (CDC) for  adults born from [birth year] through 1963.   Completed  . HIV Screening  Completed  *Topic was postponed. The date shown is not the original due date.

## 2018-01-24 NOTE — Progress Notes (Signed)
Subjective:   Teresa [name] is a 62 y.o. female who presents for Medicare Annual (Subsequent) preventive examination.  Teresa [name] is a divorced female who lives at home with her grand daughter Jordan Hawks).  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  Teresa [name] enjoys fishing, collecting coins and visiting with the elderly at the nursing homes in the community.  She does not exercise and only eats approximately two meals daily that usually consist of bread and veggies.  She has had no hospitalization, surgeries or ER visits in the past year.  Overall Teresa [name] feels that her health is the same, somewhat worse due to chronic pain.    Objective:     Vitals: BP 108/69   Pulse 92   Temp (!) 97.2 F (36.2 C) (Oral)   Ht 5\' 2"  (1.575 m)   Wt 146 lb 6.4 oz (66.4 kg)   BMI 26.78 kg/m   Body mass index is 26.78 kg/m.  Advanced Directives 07/07/2016 05/15/2015 08/07/2014 05/14/2014 01/22/2014 12/25/2013 12/12/2013  Does Patient Have a Medical Advance Directive? No No No No No No No  Would patient like information on creating a medical advance directive? - No - patient declined information - No - patient declined information - - No - patient declined information  Pre-existing out of facility DNR order (yellow form or pink MOST form) - - - - - - -    Tobacco Social History   Tobacco Use  Smoking Status Former Smoker  . Last attempt to quit: 08/20/1991  . Years since quitting: 26.4  Smokeless Tobacco Never Used     Counseling given: Not Answered   Clinical Intake:  Pre-visit preparation completed: Yes  Pain : No/denies pain     BMI - recorded: 26.9 Nutritional Status: BMI 25 -29 Overweight Diabetes: No  How often do you need to have someone help you when you read instructions, pamphlets, or other written materials from your doctor or pharmacy?: 1 - Never What is the last grade level you completed in school?: GED  Interpreter Needed?:  No  Information entered by :: Truett Mainland, LPN  Past Medical History:  Diagnosis Date  . Anxiety state, unspecified   . Asthma    prn inhaler  . Chronic back pain greater than 3 months duration   . Colon polyp   . Degeneration of intervertebral disc, site unspecified   . Depressive disorder, not elsewhere classified   . Diverticulitis   . Diverticulosis 07-08-2005   colonoscopy  . Esophageal stricture 2014  . Full dentures   . GERD (gastroesophageal reflux disease) 07-08-2005   EGD  . Hiatal hernia 07-08-2005   EGD  . Insomnia   . Irritable bowel syndrome   . Migraine   . Muscle spasm   . Osteoarthrosis, unspecified whether generalized or localized, lower leg   . Papilloma of breast 09/2011   left  . Seasonal allergies   . Seizures ([place]) 1990s   x 1, unknown cause; no seizures since  . Vocal cord polyp    history of   Past Surgical History:  Procedure Laterality Date  . ABDOMINAL HYSTERECTOMY     partial  . BILATERAL SALPINGOOPHORECTOMY    . BLADDER SURGERY     bladder tack  . BREAST BIOPSY  11/03/2011   Procedure: BREAST BIOPSY WITH NEEDLE LOCALIZATION;  Surgeon: Harl Bowie, MD;  Location: [facility];  Service: General;  Laterality: Left;  needle localized  left breast biopsy  . CHOLECYSTECTOMY    . COLONOSCOPY  07/08/2005   562.10  . CYSTOSCOPY  08/24/2011   Procedure: CYSTOSCOPY;  Surgeon: Reece Packer, MD;  Location: [facility];  Service: Urology;  Laterality: N/A;  Cystoscopy,  Rectocele Repair and Vault Prolapse Repair  . ESOPHAGOGASTRODUODENOSCOPY  10/03/2012   multiple   . OTHER SURGICAL HISTORY     exc. vocal cord polyp  . RECTAL TUMOR BY PROCTOTOMY EXCISION    . RECTOCELE REPAIR  08/24/2011   Procedure: POSTERIOR REPAIR (RECTOCELE);  Surgeon: Reece Packer, MD;  Location: [facility];  Service: Urology;  Laterality: N/A;  . rectocelle repair    . TUMOR REMOVAL     benign  . VAGINAL PROLAPSE REPAIR  08/24/2011   Procedure: VAGINAL  VAULT SUSPENSION;  Surgeon: Reece Packer, MD;  Location: [facility];  Service: Urology;  Laterality: N/A;   Family History  Problem Relation Age of Onset  . Prostate cancer Father   . Sleep apnea Father   . Stomach cancer Maternal Uncle   . Diabetes Maternal Grandmother   . Stroke Maternal Grandmother   . Heart disease Paternal Grandmother   . Irritable bowel syndrome Unknown   . Stroke Maternal Grandfather    Social History   Socioeconomic History  . Marital status: Divorced    Spouse name: Not on file  . Number of children: 1  . Years of education: GED  . Highest education level: Not on file  Occupational History  . Occupation: Disabled  Social Needs  . Financial resource strain: Somewhat hard  . Food insecurity:    Worry: Sometimes true    Inability: Sometimes true  . Transportation needs:    Medical: No    Non-medical: No  Tobacco Use  . Smoking status: Former Smoker    Last attempt to quit: 08/20/1991    Years since quitting: 26.4  . Smokeless tobacco: Never Used  Substance and Sexual Activity  . Alcohol use: No    Alcohol/week: 0.0 standard drinks    Comment: quit 23 yrs ago, 08-24-2016 per pt no  . Drug use: Yes    Comment: quit 23 yrs ago. Cocaine and marijuana, 08-24-2016 per pt 23 yrs ago  . Sexual activity: Never  Lifestyle  . Physical activity:    Days per week: 0 days    Minutes per session: Not on file  . Stress: Only a little  Relationships  . Social connections:    Talks on phone: More than three times a week    Gets together: More than three times a week    Attends religious service: More than 4 times per year    Active member of club or organization: Yes    Attends meetings of clubs or organizations: More than 4 times per year    Relationship status: Divorced  Other Topics Concern  . Not on file  Social History Narrative   Lives in an apartment. Her 33 year old granddaughter lives with her. Her granddaughter does not work and sometimes it is  difficulty to stretch her food stamps out to feed both of them.    Outpatient Encounter Medications as of 01/24/2018  Medication Sig  . albuterol (PROVENTIL) (2.5 MG/3ML) 0.083% nebulizer solution Take 3 mLs (2.5 mg total) by nebulization every 6 (six) hours as needed for wheezing or shortness of breath.  . buprenorphine (BUTRANS - DOSED MCG/HR) 10 MCG/HR PTWK patch 20 mcg.   Marland Kitchen buPROPion (WELLBUTRIN XL) 300  MG 24 hr tablet TAKE ONE (1) TABLET EACH DAY  . cetirizine (ZYRTEC) 10 MG tablet TAKE ONE TABLET EVERY MORNING  . cyclobenzaprine (FLEXERIL) 10 MG tablet 2 (two) times daily after a meal.   . DEXILANT 60 MG capsule TAKE ONE (1) CAPSULE EACH DAY  . diazepam (VALIUM) 10 MG tablet   . diclofenac sodium (VOLTAREN) 1 % GEL   . docusate sodium (COLACE) 100 MG capsule Take 100-200 mg by mouth daily as needed for mild constipation.   Marland Kitchen linaclotide (LINZESS) 72 MCG capsule Take 1-2 capsules (72-144 mcg total) by mouth daily before breakfast.  . lisinopril (PRINIVIL,ZESTRIL) 10 MG tablet TAKE ONE (1) TABLET EACH DAY  . MAGNESIUM PO Take 15 mg by mouth.  . mometasone (NASONEX) 50 MCG/ACT nasal spray USE 2 SPRAYS IN EACH NOSTRIL ONCE DAILY  . montelukast (SINGULAIR) 10 MG tablet TAKE ONE (1) TABLET EACH DAY  . Olopatadine HCl 0.2 % SOLN Place 1 drop into both eyes daily as needed for irritation.  . ondansetron (ZOFRAN) 4 MG tablet   . oxymorphone (OPANA) 5 MG tablet   . polyethylene glycol powder (GLYCOLAX/MIRALAX) powder MIX 17 GRAMS INTO 8OZ OF WATER AND DRINK DAILY  . PROAIR HFA 108 (90 Base) MCG/ACT inhaler USE 1 TO 2 PUFFS EVERY 4 TO 6 HOURS AS NEEDED  . Probiotic Product (PROBIOTIC DAILY PO) Take by mouth.  . SYMBICORT 160-4.5 MCG/ACT inhaler USE 2 PUFFS TWICE DAILY  . ZTLIDO 1.8 % PTCH   . [DISCONTINUED] diazepam (VALIUM) 5 MG tablet Take 5 mg by mouth daily.   . [DISCONTINUED] oxymorphone (OPANA) 10 MG tablet    No facility-administered encounter medications on file as of 01/24/2018.      Activities of Daily Living In your present state of health, do you have any difficulty performing the following activities: 01/24/2018  Hearing? N  Vision? N  Difficulty concentrating or making decisions? N  Walking or climbing stairs? N  Dressing or bathing? N  Doing errands, shopping? N  Some recent data might be hidden  no difficulties with ADLs at this time  Patient Care Team: Dettinger, Fransisca Kaufmann, MD as PCP - General (Family Medicine) Irene Shipper, MD as Consulting Physician (Gastroenterology)    Assessment:   This is a routine wellness examination for Naoko.  Exercise Activities and Dietary recommendations Current Exercise Habits: The patient does not participate in regular exercise at present  Goals    . Exercise 3x per week (30 min per time)     Try to exercise for at least 30 minutes, 3 times weekly.     Encourage patient to try to exercise for at least 30 minutes, 3 times weekly  Fall Risk Fall Risk  01/10/2018 12/20/2017 11/23/2017 08/23/2017 07/21/2017  Falls in the past year? 0 No No No No   Is the patient's home free of loose throw rugs in walkways, pet beds, electrical cords, etc?   yes      Grab bars in the bathroom? yes      Handrails on the stairs?   yes      Adequate lighting?   yes  Timed Get Up and Go performed:   Depression Screen PHQ 2/9 Scores 01/24/2018 01/20/2018 01/10/2018 12/20/2017  PHQ - 2 Score 0 3 5 0  PHQ- 9 Score - 7 20 -   No depression noted at this time  Cognitive Function MMSE - Mini Mental State Exam 01/24/2018 01/18/2017  Orientation to time 5 5  Orientation to  Place 5 5  Registration 3 3  Attention/ Calculation 5 5  Recall 3 3  Language- name 2 objects 2 2  Language- repeat 1 1  Language- follow 3 step command 3 3  Language- read & follow direction 1 1  Write a sentence 1 1  Copy design 1 1  Total score 30 30  No memory loss noted at this time      Immunization History  Administered Date(s) Administered  .  Influenza,inj,Quad PF,6+ Mos 12/19/2013, 11/28/2014, 01/27/2016, 12/20/2017  . Pneumococcal Polysaccharide-23 06/28/2017  . Tdap 12/07/2011, 01/27/2016  . Zoster 10/17/2014    Qualifies for Shingles Vaccine? Declines shingrix vaccine at this time  Screening Tests Health Maintenance  Topic Date Due  . DEXA SCAN  01/25/2019 (Originally 05/04/2017)  . COLONOSCOPY  01/25/2019 (Originally 09/12/2016)  . PAP SMEAR  05/01/2018  . MAMMOGRAM  10/28/2018  . TETANUS/TDAP  01/26/2026  . INFLUENZA VACCINE  Completed  . Hepatitis C Screening  Completed  . HIV Screening  Completed  Will call to schedule colonoscopy Mammogram done today 01/24/2018 Dexa done today 01/24/2018  Cancer Screenings: Lung: Low Dose CT Chest recommended if Age 60-80 years, 30 pack-year currently smoking OR have quit w/in 15years. Patient does not qualify. Breast:  Up to date on Mammogram? Yes   Up to date of Bone Density/Dexa? Yes Colorectal: Will call to schedule  Additional Screenings: Hepatitis C Screening:      Plan:   Encouraged Ms. Knee to try to eat 3 healthy meals daily that consist of lean proteins, fruits and vegetables.  Encouraged her to try to exercise for at least 30 minutes, 3 times weekly. Encouraged to call current GI office to schedule colonoscopy.  Dexa scan done today 01/24/2018.  Mammogram done today 01/24/2018, mammogram bus. Explained the importance of shingrix vaccine   Encouraged to keep follow up appointment with Dr. Warrick Parisian on 01/27/2018.  Encouraged to keep New Patient appointment with cardiologist.   I have personally reviewed and noted the following in the patient's chart:   . Medical and social history . Use of alcohol, tobacco or illicit drugs  . Current medications and supplements . Functional ability and status . Nutritional status . Physical activity . Advanced directives . List of other physicians . Hospitalizations, surgeries, and ER visits in previous 12  months . Vitals . Screenings to include cognitive, depression, and falls . Referrals and appointments  In addition, I have reviewed and discussed with patient certain preventive protocols, quality metrics, and best practice recommendations. A written personalized care plan for preventive services as well as general preventive health recommendations were provided to patient.     Wardell Heath, LPN  [id number]   I have reviewed and agree with the above AWV documentation.   Evelina Dun, FNP

## 2018-01-25 ENCOUNTER — Telehealth: Payer: Self-pay | Admitting: Family Medicine

## 2018-01-25 DIAGNOSIS — M8588 Other specified disorders of bone density and structure, other site: Secondary | ICD-10-CM | POA: Diagnosis not present

## 2018-01-25 NOTE — Telephone Encounter (Signed)
Advised pt we don't have any open calls or results for her and she states she thinks the call might have been to verify appt on Friday.

## 2018-01-27 ENCOUNTER — Encounter: Payer: Self-pay | Admitting: Family Medicine

## 2018-01-27 ENCOUNTER — Ambulatory Visit (INDEPENDENT_AMBULATORY_CARE_PROVIDER_SITE_OTHER): Payer: Medicare Other | Admitting: Family Medicine

## 2018-01-27 VITALS — BP 130/82 | HR 103 | Temp 97.5°F | Ht 62.0 in | Wt 145.8 lb

## 2018-01-27 DIAGNOSIS — F331 Major depressive disorder, recurrent, moderate: Secondary | ICD-10-CM | POA: Diagnosis not present

## 2018-01-27 DIAGNOSIS — K219 Gastro-esophageal reflux disease without esophagitis: Secondary | ICD-10-CM

## 2018-01-27 DIAGNOSIS — R7303 Prediabetes: Secondary | ICD-10-CM

## 2018-01-27 DIAGNOSIS — K222 Esophageal obstruction: Secondary | ICD-10-CM

## 2018-01-27 DIAGNOSIS — I10 Essential (primary) hypertension: Secondary | ICD-10-CM

## 2018-01-27 LAB — BAYER DCA HB A1C WAIVED: HB A1C (BAYER DCA - WAIVED): 5.7 % (ref <7.0)

## 2018-01-27 NOTE — Progress Notes (Signed)
BP 130/82   Pulse (!) 103   Temp (!) 97.5 F (36.4 C)   Ht 5' 2"  (1.575 m)   Wt 145 lb 12.8 oz (66.1 kg)   BMI 26.67 kg/m    Subjective:    Patient ID: Teresa [name], female    DOB: [birth year], 62 y.o.   MRN: [id number]  HPI: Teresa [name] is a 62 y.o. female presenting on 01/27/2018 for Follow-up (medication )   HPI Hypertension Patient is currently on lisinopril, and their blood pressure today is 130/82. Patient denies any lightheadedness or dizziness. Patient denies headaches, blurred vision, chest pains, shortness of breath, or weakness. Denies any side effects from medication and is content with current medication.   GERD Patient is currently on [place].  She denies any major symptoms or abdominal pain or belching or burping. She denies any blood in her stool or lightheadedness or dizziness.   Prediabetes  Patient comes in today for recheck of his diabetes. Patient has been currently taking no medication currently but we are monitoring and has been doing diet control. Patient is currently on an ACE inhibitor/ARB. Patient has not seen an ophthalmologist this year. Patient denies any issues with their feet.   Depression and anxiety recheck Patient is coming in for depression and anxiety recheck, she is doing the Wellbutrin and feels like it is doing very well for her and she is very happy with her and denies any major feelings of depression or anxiety.  She also takes her Valium which she gets prescribed from another physician and says that that is working well for her as well. Depression screen [facility] 2/9 01/27/2018 01/24/2018 01/20/2018 01/10/2018 12/20/2017  Decreased Interest 1 0 1 3 0  Down, Depressed, Hopeless 0 0 2 2 0  PHQ - 2 Score 1 0 3 5 0  Altered sleeping - - 1 3 -  Tired, decreased energy - - 1 3 -  Change in appetite - - 0 1 -  Feeling bad or failure about yourself  - - 0 2 -  Trouble concentrating - - 0 3 -  Moving slowly or fidgety/restless - - 2 3 -    Suicidal thoughts - - 0 0 -  PHQ-9 Score - - 7 20 -  Difficult doing work/chores - - - - -  Some recent data might be hidden     Relevant past medical, surgical, family and social history reviewed and updated as indicated. Interim medical history since our last visit reviewed. Allergies and medications reviewed and updated.  Review of Systems  Constitutional: Negative for chills and fever.  HENT: Negative for ear pain and facial swelling.   Eyes: Negative for visual disturbance.  Respiratory: Negative for chest tightness and shortness of breath.   Cardiovascular: Negative for chest pain and leg swelling.  Musculoskeletal: Negative for back pain and gait problem.  Skin: Negative for rash.  Neurological: Negative for light-headedness and headaches.  Psychiatric/Behavioral: Positive for decreased concentration. Negative for agitation, behavioral problems, self-injury, sleep disturbance and suicidal ideas. The patient is nervous/anxious.   All other systems reviewed and are negative.   Per HPI unless specifically indicated above   Allergies as of 01/27/2018      Reactions   Lyrica [pregabalin] Shortness Of Breath   Trazodone And Nefazodone Shortness Of Breath   Amitriptyline    Arthrotec [diclofenac-misoprostol] Other (See Comments)   Upset stomach   Keflex [cephalexin] Other (See Comments)   Unknown reaction   Naproxen Other (  See Comments)   Irritates stomach   Prozac [fluoxetine Hcl] Other (See Comments)   "makes me feel bad"   Remeron [mirtazapine] Other (See Comments)   "off balance"   Zolpidem Other (See Comments)   Other   Ambien [zolpidem Tartrate] Other (See Comments)   COULDN'T FUNCTION, STAYED SLEEPY      Medication List        Accurate as of 01/27/18  2:03 PM. Always use your most recent med list.          buprenorphine 10 MCG/HR Ptwk patch Commonly known as:  BUTRANS - dosed mcg/hr 20 mcg.   buPROPion 300 MG 24 hr tablet Commonly known as:   WELLBUTRIN XL TAKE ONE (1) TABLET EACH DAY   cetirizine 10 MG tablet Commonly known as:  ZYRTEC TAKE ONE TABLET EVERY MORNING   cyclobenzaprine 10 MG tablet Commonly known as:  FLEXERIL 2 (two) times daily after a meal.   DEXILANT 60 MG capsule Generic drug:  dexlansoprazole TAKE ONE (1) CAPSULE EACH DAY   diazepam 10 MG tablet Commonly known as:  VALIUM   diclofenac sodium 1 % Gel Commonly known as:  VOLTAREN   docusate sodium 100 MG capsule Commonly known as:  COLACE Take 100-200 mg by mouth daily as needed for mild constipation.   linaclotide 72 MCG capsule Commonly known as:  LINZESS Take 1-2 capsules (72-144 mcg total) by mouth daily before breakfast.   lisinopril 10 MG tablet Commonly known as:  PRINIVIL,ZESTRIL TAKE ONE (1) TABLET EACH DAY   MAGNESIUM PO Take 15 mg by mouth.   mometasone 50 MCG/ACT nasal spray Commonly known as:  NASONEX USE 2 SPRAYS IN EACH NOSTRIL ONCE DAILY   montelukast 10 MG tablet Commonly known as:  SINGULAIR TAKE ONE (1) TABLET EACH DAY   Olopatadine HCl 0.2 % Soln Place 1 drop into both eyes daily as needed for irritation.   ondansetron 4 MG tablet Commonly known as:  ZOFRAN   oxymorphone 5 MG tablet Commonly known as:  OPANA   polyethylene glycol powder powder Commonly known as:  GLYCOLAX/MIRALAX MIX 17 GRAMS INTO 8OZ OF WATER AND DRINK DAILY   PROAIR HFA 108 (90 Base) MCG/ACT inhaler Generic drug:  albuterol USE 1 TO 2 PUFFS EVERY 4 TO 6 HOURS AS NEEDED   albuterol (2.5 MG/3ML) 0.083% nebulizer solution Commonly known as:  PROVENTIL Take 3 mLs (2.5 mg total) by nebulization every 6 (six) hours as needed for wheezing or shortness of breath.   PROBIOTIC DAILY PO Take by mouth.   SYMBICORT 160-4.5 MCG/ACT inhaler Generic drug:  budesonide-formoterol USE 2 PUFFS TWICE DAILY   ZTLIDO 1.8 % Ptch Generic drug:  Lidocaine          Objective:    BP 130/82   Pulse (!) 103   Temp (!) 97.5 F (36.4 C)   Ht 5'  2" (1.575 m)   Wt 145 lb 12.8 oz (66.1 kg)   BMI 26.67 kg/m   Wt Readings from Last 3 Encounters:  01/27/18 145 lb 12.8 oz (66.1 kg)  01/24/18 146 lb 6.4 oz (66.4 kg)  01/20/18 147 lb (66.7 kg)    Physical Exam  Constitutional: She is oriented to person, place, and time. She appears well-developed and well-nourished. No distress.  Eyes: Conjunctivae are normal.  Cardiovascular: Normal rate, regular rhythm, normal heart sounds and intact distal pulses.  No murmur heard. Pulmonary/Chest: Effort normal and breath sounds normal. No respiratory distress. She has no wheezes.  Musculoskeletal:  Normal range of motion. She exhibits no edema.  Neurological: She is alert and oriented to person, place, and time. Coordination normal.  Skin: Skin is warm and dry. No rash noted. She is not diaphoretic.  Psychiatric: Her behavior is normal. Her mood appears anxious. She exhibits a depressed mood. She expresses no suicidal ideation. She expresses no suicidal plans.  Nursing note and vitals reviewed.       Assessment & Plan:   Problem List Items Addressed This Visit      Cardiovascular and Mediastinum   HTN (hypertension)   Relevant Orders   CBC with Differential/Platelet (Completed)   CMP14+EGFR (Completed)     Digestive   ESOPHAGEAL STRICTURE   Gastroesophageal reflux disease without esophagitis   Relevant Orders   CBC with Differential/Platelet (Completed)     Other   Prediabetes - Primary   Relevant Orders   Bayer DCA Hb A1c Waived (Completed)   Major depressive disorder, recurrent episode, moderate (HCC)    Continue Wellbutrin and Dexilant and lisinopril  Follow up plan: Return in about 6 months (around 07/28/2018), or if symptoms worsen or fail to improve, for Hypertension and diabetes.  Counseling provided for all of the vaccine components Orders Placed This Encounter  Procedures  . Bayer DCA Hb A1c Waived  . CBC with Differential/Platelet  . [place]  Dettinger, MD [facility] 01/27/2018, 2:03 PM

## 2018-01-28 LAB — CBC WITH DIFFERENTIAL/PLATELET
Basophils Absolute: 0.1 10*3/uL (ref 0.0–0.2)
Basos: 1 %
EOS (ABSOLUTE): 0.2 10*3/uL (ref 0.0–0.4)
Eos: 3 %
HEMOGLOBIN: 13.4 g/dL (ref 11.1–15.9)
Hematocrit: 40 % (ref 34.0–46.6)
Immature Grans (Abs): 0 10*3/uL (ref 0.0–0.1)
Immature Granulocytes: 0 %
Lymphocytes Absolute: 1.6 10*3/uL (ref 0.7–3.1)
Lymphs: 28 %
MCH: 27.5 pg (ref 26.6–33.0)
MCHC: 33.5 g/dL (ref 31.5–35.7)
MCV: 82 fL (ref 79–97)
MONOCYTES: 6 %
Monocytes Absolute: 0.4 10*3/uL (ref 0.1–0.9)
Neutrophils Absolute: 3.5 10*3/uL (ref 1.4–7.0)
Neutrophils: 62 %
Platelets: 202 10*3/uL (ref 150–450)
RBC: 4.87 x10E6/uL (ref 3.77–5.28)
RDW: 13.3 % (ref 12.3–15.4)
WBC: 5.7 10*3/uL (ref 3.4–10.8)

## 2018-01-28 LAB — CMP14+EGFR
ALT: 19 IU/L (ref 0–32)
AST: 23 IU/L (ref 0–40)
Albumin/Globulin Ratio: 1.7 (ref 1.2–2.2)
Albumin: 4.2 g/dL (ref 3.6–4.8)
Alkaline Phosphatase: 83 IU/L (ref 39–117)
BUN/Creatinine Ratio: 10 — ABNORMAL LOW (ref 12–28)
BUN: 8 mg/dL (ref 8–27)
Bilirubin Total: 0.3 mg/dL (ref 0.0–1.2)
CALCIUM: 9.1 mg/dL (ref 8.7–10.3)
CO2: 21 mmol/L (ref 20–29)
Chloride: 102 mmol/L (ref 96–106)
Creatinine, Ser: 0.82 mg/dL (ref 0.57–1.00)
GFR calc Af Amer: 90 mL/min/{1.73_m2} (ref >59)
GFR calc non Af Amer: 78 mL/min/{1.73_m2} (ref >59)
Globulin, Total: 2.5 g/dL (ref 1.5–4.5)
Glucose: 128 mg/dL — ABNORMAL HIGH (ref 65–99)
Potassium: 4.4 mmol/L (ref 3.5–5.2)
Sodium: 138 mmol/L (ref 134–144)
Total Protein: 6.7 g/dL (ref 6.0–8.5)

## 2018-01-31 ENCOUNTER — Encounter: Payer: Self-pay | Admitting: Family Medicine

## 2018-02-01 ENCOUNTER — Other Ambulatory Visit: Payer: Self-pay | Admitting: *Deleted

## 2018-02-01 DIAGNOSIS — I10 Essential (primary) hypertension: Secondary | ICD-10-CM

## 2018-02-01 MED ORDER — LISINOPRIL 10 MG PO TABS: ORAL_TABLET | ORAL | 1 refills | Status: DC

## 2018-02-06 ENCOUNTER — Ambulatory Visit: Payer: Medicare Other | Admitting: Family Medicine

## 2018-02-07 ENCOUNTER — Encounter: Payer: Self-pay | Admitting: Family Medicine

## 2018-02-07 ENCOUNTER — Ambulatory Visit (INDEPENDENT_AMBULATORY_CARE_PROVIDER_SITE_OTHER): Payer: Medicare Other | Admitting: Family Medicine

## 2018-02-07 VITALS — BP 95/54 | HR 95 | Temp 98.2°F | Ht 62.0 in | Wt 146.0 lb

## 2018-02-07 DIAGNOSIS — K581 Irritable bowel syndrome with constipation: Secondary | ICD-10-CM

## 2018-02-07 MED ORDER — LINACLOTIDE 290 MCG PO CAPS: 290.0000 ug | ORAL_CAPSULE | Freq: Every day | ORAL | 12 refills | Status: DC

## 2018-02-07 NOTE — Progress Notes (Signed)
Subjective: CC: IBS-C PCP: Dettinger, Fransisca Kaufmann, MD EOF:HQRFX Teresa [name] is a 62 y.o. female presenting to clinic today for:  1. IBS- constipation Patient here for follow-up on chronic constipation thought to be secondary to IBS.  When she was last seen 2 weeks ago she was having generalized abdominal pain that was ongoing despite use of Colace, senna and MiraLAX.  She was given a sample of Linzess to take up to 144 mcg daily.  She notes good bowel movement with the 144 mcg daily.  She in fact took up to 288 mcg daily and found excellent results.  She denies any abdominal pain, cramping, nausea, vomiting, decreased p.o. intake, diarrhea, hematochezia or melena.  She would like to continue the medication at the high dose.   ROS: Per HPI  Allergies  Allergen Reactions  . Lyrica [Pregabalin] Shortness Of Breath  . Trazodone And Nefazodone Shortness Of Breath  . Amitriptyline   . Arthrotec [Diclofenac-Misoprostol] Other (See Comments)    Upset stomach  . Keflex [Cephalexin] Other (See Comments)    Unknown reaction  . Naproxen Other (See Comments)    Irritates stomach  . Prozac [Fluoxetine Hcl] Other (See Comments)    "makes me feel bad"  . Remeron [Mirtazapine] Other (See Comments)    "off balance"  . Zolpidem Other (See Comments)    Other  . Ambien [Zolpidem Tartrate] Other (See Comments)    COULDN'T FUNCTION, STAYED SLEEPY   Past Medical History:  Diagnosis Date  . Anxiety state, unspecified   . Asthma    prn inhaler  . Chronic back pain greater than 3 months duration   . Colon polyp   . Degeneration of intervertebral disc, site unspecified   . Depressive disorder, not elsewhere classified   . Diverticulitis   . Diverticulosis 07-08-2005   colonoscopy  . Esophageal stricture 2014  . Full dentures   . GERD (gastroesophageal reflux disease) 07-08-2005   EGD  . Hiatal hernia 07-08-2005   EGD  . Insomnia   . Irritable bowel syndrome   . Migraine   . Muscle spasm   .  Osteoarthrosis, unspecified whether generalized or localized, lower leg   . Papilloma of breast 09/2011   left  . Seasonal allergies   . Seizures ([place]) 1990s   x 1, unknown cause; no seizures since  . Vocal cord polyp    history of    Current Outpatient Medications:  .  albuterol (PROVENTIL) (2.5 MG/3ML) 0.083% nebulizer solution, Take 3 mLs (2.5 mg total) by nebulization every 6 (six) hours as needed for wheezing or shortness of breath., Disp: 150 mL, Rfl: 1 .  buprenorphine (BUTRANS - DOSED MCG/HR) 10 MCG/HR PTWK patch, 20 mcg. , Disp: , Rfl:  .  buPROPion (WELLBUTRIN XL) 300 MG 24 hr tablet, TAKE ONE (1) TABLET EACH DAY, Disp: 30 tablet, Rfl: 1 .  cetirizine (ZYRTEC) 10 MG tablet, TAKE ONE TABLET EVERY MORNING, Disp: 30 tablet, Rfl: 5 .  cyclobenzaprine (FLEXERIL) 10 MG tablet, 2 (two) times daily after a meal. , Disp: , Rfl:  .  DEXILANT 60 MG capsule, TAKE ONE (1) CAPSULE EACH DAY, Disp: 90 capsule, Rfl: 1 .  diazepam (VALIUM) 10 MG tablet, , Disp: , Rfl:  .  diclofenac sodium (VOLTAREN) 1 % GEL, , Disp: , Rfl:  .  docusate sodium (COLACE) 100 MG capsule, Take 100-200 mg by mouth daily as needed for mild constipation. , Disp: , Rfl:  .  lisinopril (PRINIVIL,ZESTRIL) 10 MG tablet,  TAKE ONE (1) TABLET EACH DAY, Disp: 90 tablet, Rfl: 1 .  mometasone (NASONEX) 50 MCG/ACT nasal spray, USE 2 SPRAYS IN EACH NOSTRIL ONCE DAILY, Disp: 51 g, Rfl: 2 .  montelukast (SINGULAIR) 10 MG tablet, TAKE ONE (1) TABLET EACH DAY, Disp: 90 tablet, Rfl: 0 .  Olopatadine HCl 0.2 % SOLN, Place 1 drop into both eyes daily as needed for irritation., Disp: , Rfl:  .  ondansetron (ZOFRAN) 4 MG tablet, , Disp: , Rfl:  .  oxymorphone (OPANA) 5 MG tablet, , Disp: , Rfl:  .  polyethylene glycol powder (GLYCOLAX/MIRALAX) powder, MIX 17 GRAMS INTO 8OZ OF WATER AND DRINK DAILY, Disp: 527 g, Rfl: 4 .  PROAIR HFA 108 (90 Base) MCG/ACT inhaler, USE 1 TO 2 PUFFS EVERY 4 TO 6 HOURS AS NEEDED, Disp: 25.5 g, Rfl: 4 .  Probiotic  Product (PROBIOTIC DAILY PO), Take by mouth., Disp: , Rfl:  .  SYMBICORT 160-4.5 MCG/ACT inhaler, USE 2 PUFFS TWICE DAILY, Disp: 10.2 g, Rfl: 3 .  ZTLIDO 1.8 % PTCH, , Disp: , Rfl:  .  linaclotide (LINZESS) 290 MCG CAPS capsule, Take 1 capsule (290 mcg total) by mouth daily before breakfast., Disp: 30 capsule, Rfl: 12 Social History   Socioeconomic History  . Marital status: Divorced    Spouse name: Not on file  . Number of children: 1  . Years of education: GED  . Highest education level: Not on file  Occupational History  . Occupation: Disabled  Social Needs  . Financial resource strain: Somewhat hard  . Food insecurity:    Worry: Sometimes true    Inability: Sometimes true  . Transportation needs:    Medical: No    Non-medical: No  Tobacco Use  . Smoking status: Former Smoker    Last attempt to quit: 08/20/1991    Years since quitting: 26.4  . Smokeless tobacco: Never Used  Substance and Sexual Activity  . Alcohol use: No    Alcohol/week: 0.0 standard drinks    Comment: quit 23 yrs ago, 08-24-2016 per pt no  . Drug use: Yes    Comment: quit 23 yrs ago. Cocaine and marijuana, 08-24-2016 per pt 23 yrs ago  . Sexual activity: Never  Lifestyle  . Physical activity:    Days per week: 0 days    Minutes per session: Not on file  . Stress: Only a little  Relationships  . Social connections:    Talks on phone: More than three times a week    Gets together: More than three times a week    Attends religious service: More than 4 times per year    Active member of club or organization: Yes    Attends meetings of clubs or organizations: More than 4 times per year    Relationship status: Divorced  . Intimate partner violence:    Fear of current or ex partner: No    Emotionally abused: No    Physically abused: No    Forced sexual activity: No  Other Topics Concern  . Not on file  Social History Narrative   Lives in an apartment. Her 73 year old granddaughter lives with her. Her  granddaughter does not work and sometimes it is difficulty to stretch her food stamps out to feed both of them.   Family History  Problem Relation Age of Onset  . Prostate cancer Father   . Sleep apnea Father   . Stomach cancer Maternal Uncle   . Diabetes Maternal Grandmother   .  Stroke Maternal Grandmother   . Heart disease Paternal Grandmother   . Irritable bowel syndrome Unknown   . Stroke Maternal Grandfather     Objective: Office vital signs reviewed. BP (!) 95/54   Pulse 95   Temp 98.2 F (36.8 C) (Oral)   Ht 5\' 2"  (1.575 m)   Wt 146 lb (66.2 kg)   BMI 26.70 kg/m   Physical Examination:  General: Awake, alert, well nourished, No acute distress GI: soft, non-tender, non-distended, bowel sounds present x4, no hepatomegaly, no splenomegaly, no masses  Assessment/ Plan: 62 y.o. female    1. Irritable bowel syndrome with constipation 290 mcg capsules prescribed to patient.  8-day sample also provided while we work with insurance on this.  She thinks it is covered but it may need authorization.  She will follow-up with Dr. Warrick Parisian for ongoing needs. - linaclotide (LINZESS) 290 MCG CAPS capsule; Take 1 capsule (290 mcg total) by mouth daily before breakfast.  Dispense: 30 capsule; Refill: 12   No orders of the defined types were placed in this encounter.  Meds ordered this encounter  Medications  . linaclotide (LINZESS) 290 MCG CAPS capsule    Sig: Take 1 capsule (290 mcg total) by mouth daily before breakfast.    Dispense:  30 capsule    Refill:  Lucas, DO [place] [phone]

## 2018-02-07 NOTE — Patient Instructions (Signed)
I have given you a week's worth of the high dose Linzess.  A prescription has been sent to the pharmacy as well.  Follow up with Dr Warrick Parisian as needed for ongoing needs.

## 2018-02-08 ENCOUNTER — Encounter (INDEPENDENT_AMBULATORY_CARE_PROVIDER_SITE_OTHER): Payer: Self-pay | Admitting: *Deleted

## 2018-02-08 ENCOUNTER — Other Ambulatory Visit (INDEPENDENT_AMBULATORY_CARE_PROVIDER_SITE_OTHER): Payer: Self-pay | Admitting: *Deleted

## 2018-02-08 DIAGNOSIS — Z86010 Personal history of colonic polyps: Secondary | ICD-10-CM | POA: Insufficient documentation

## 2018-02-09 ENCOUNTER — Other Ambulatory Visit: Payer: Self-pay | Admitting: Family Medicine

## 2018-02-09 DIAGNOSIS — F331 Major depressive disorder, recurrent, moderate: Secondary | ICD-10-CM

## 2018-02-14 ENCOUNTER — Encounter: Payer: Self-pay | Admitting: Family Medicine

## 2018-02-14 NOTE — Progress Notes (Signed)
Cardiology Office Note   Date:  02/15/2018   ID:  Teresa [name], DOB 01-03-1956, MRN [id number]  PCP:  Teresa [name], Teresa Kaufmann, MD  Cardiologist:   No primary care provider on file. Referring:  Teresa [name], Teresa Kaufmann, MD  Chief Complaint  Patient presents with  . Chest Pain      History of Present Illness: Teresa [name] is a 62 y.o. female who is referred by Teresa [name], Teresa Kaufmann, MD for evaluation of chest pain.  She was in [place] . I reviewed these records for this visit.   She had chest pain but no objective evidence of ischemia.  She reported that she has been getting chest pain off and on for about 3 or 4 months.  Is been happening since her hospitalization.  Happens a few times a week.  Seems to be sporadic.  She can do a little vacuuming without bringing on this discomfort but also might bring on the discomfort.  It also happens at rest.  Substernal heaviness.  It might be 5 out of 10 in intensity lasting for 5 to 10 minutes.  Does not seem to radiate.  It does not seem to be associated with nausea vomiting or diaphoresis.  She does have some dyspnea on exertion it seems to be unrelated.  She has not had any prior cardiac history.   She did attempt a treadmill test some years ago but she was unable to do this because of shortness of breath.  She does have reflux but this discomfort does not feel like that.  Because of the symptoms she was referred for further evaluation.  She lives with her granddaughter.  Sounds like there is a lot of stress and financial issues.  Past Medical History:  Diagnosis Date  . Anxiety state, unspecified   . Asthma    prn inhaler  . Chronic back pain greater than 3 months duration   . Colon polyp   . Degeneration of intervertebral disc, site unspecified   . Depressive disorder, not elsewhere classified   . Diverticulitis   . Diverticulosis 07-08-2005   colonoscopy  . Esophageal stricture 2014  . Full dentures   . GERD (gastroesophageal reflux  disease) 07-08-2005   EGD  . Hiatal hernia 07-08-2005   EGD  . Hypertension   . Insomnia   . Irritable bowel syndrome   . Migraine   . Muscle spasm   . Osteoarthrosis, unspecified whether generalized or localized, lower leg   . Papilloma of breast 09/2011   left  . Seasonal allergies   . Seizures ([place]) 1990s   x 1, unknown cause; no seizures since  . Vocal cord polyp    history of    Past Surgical History:  Procedure Laterality Date  . ABDOMINAL HYSTERECTOMY     partial  . BILATERAL SALPINGOOPHORECTOMY    . BLADDER SURGERY     bladder tack  . BREAST BIOPSY  11/03/2011   Procedure: BREAST BIOPSY WITH NEEDLE LOCALIZATION;  Surgeon: Harl Bowie, MD;  Location: [facility];  Service: General;  Laterality: Left;  needle localized left breast biopsy  . CHOLECYSTECTOMY    . COLONOSCOPY  07/08/2005   562.10  . CYSTOSCOPY  08/24/2011   Procedure: CYSTOSCOPY;  Surgeon: Reece Packer, MD;  Location: [facility];  Service: Urology;  Laterality: N/A;  Cystoscopy,  Rectocele Repair and Vault Prolapse Repair  . ESOPHAGOGASTRODUODENOSCOPY  10/03/2012   multiple   . OTHER SURGICAL HISTORY  exc. vocal cord polyp  . RECTAL TUMOR BY PROCTOTOMY EXCISION    . RECTOCELE REPAIR  08/24/2011   Procedure: POSTERIOR REPAIR (RECTOCELE);  Surgeon: Reece Packer, MD;  Location: [facility];  Service: Urology;  Laterality: N/A;  . rectocelle repair    . TUMOR REMOVAL     benign  . VAGINAL PROLAPSE REPAIR  08/24/2011   Procedure: VAGINAL VAULT SUSPENSION;  Surgeon: Reece Packer, MD;  Location: [facility];  Service: Urology;  Laterality: N/A;     Current Outpatient Medications  Medication Sig Dispense Refill  . albuterol (PROVENTIL) (2.5 MG/3ML) 0.083% nebulizer solution NEBULIZE 1 VIAL EVERY 6 HOURS AS NEEDED FOR SHORTNESS OF BREATH 150 mL 1  . buprenorphine (BUTRANS - DOSED MCG/HR) 10 MCG/HR PTWK patch 20 mcg.     Marland Kitchen buPROPion (WELLBUTRIN XL) 300 MG 24 hr tablet TAKE ONE (1)  TABLET EACH DAY 30 tablet 1  . cetirizine (ZYRTEC) 10 MG tablet TAKE ONE TABLET EVERY MORNING 30 tablet 5  . cyclobenzaprine (FLEXERIL) 10 MG tablet 2 (two) times daily after a meal.     . DEXILANT 60 MG capsule TAKE ONE (1) CAPSULE EACH DAY 90 capsule 1  . diazepam (VALIUM) 10 MG tablet Take 10 mg by mouth every 12 (twelve) hours as needed.     . diclofenac sodium (VOLTAREN) 1 % GEL     . docusate sodium (COLACE) 100 MG capsule Take 100-200 mg by mouth daily as needed for mild constipation.     Marland Kitchen linaclotide (LINZESS) 290 MCG CAPS capsule Take 1 capsule (290 mcg total) by mouth daily before breakfast. 30 capsule 12  . lisinopril (PRINIVIL,ZESTRIL) 10 MG tablet Take 10 mg by mouth daily.    . mometasone (NASONEX) 50 MCG/ACT nasal spray USE 2 SPRAYS IN EACH NOSTRIL ONCE DAILY 51 g 2  . montelukast (SINGULAIR) 10 MG tablet TAKE ONE (1) TABLET EACH DAY 90 tablet 0  . Olopatadine HCl 0.2 % SOLN Place 1 drop into both eyes daily as needed for irritation.    . ondansetron (ZOFRAN) 4 MG tablet Take 4 mg by mouth every 8 (eight) hours as needed.     Marland Kitchen oxymorphone (OPANA) 5 MG tablet Take 5 mg by mouth 2 (two) times daily.     Marland Kitchen PROAIR HFA 108 (90 Base) MCG/ACT inhaler USE 1 TO 2 PUFFS EVERY 4 TO 6 HOURS AS NEEDED 25.5 g 4  . SYMBICORT 160-4.5 MCG/ACT inhaler USE 2 PUFFS TWICE DAILY 10.2 g 3  . ZTLIDO 1.8 % PTCH 2 (two) times daily.     . polyethylene glycol powder (GLYCOLAX/MIRALAX) powder MIX 17 GRAMS INTO 8OZ OF WATER AND DRINK DAILY (Patient not taking: Reported on 02/15/2018) 527 g 4  . Probiotic Product (PROBIOTIC DAILY PO) Take by mouth.     No current facility-administered medications for this visit.     Allergies:   Lyrica [pregabalin]; Trazodone and nefazodone; Amitriptyline; Arthrotec [diclofenac-misoprostol]; Keflex [cephalexin]; Naproxen; Prozac [fluoxetine hcl]; Remeron [mirtazapine]; Zolpidem; and Ambien [zolpidem tartrate]    Social History:  The patient  reports that she quit  smoking about 26 years ago. She has never used smokeless tobacco. She reports current drug use. She reports that she does not drink alcohol.   Family History:  The patient's family history includes Diabetes in her maternal grandmother; Heart disease in her paternal grandmother; Irritable bowel syndrome in an other family member; Prostate cancer in her father; Sleep apnea in her father; Stomach cancer in her maternal uncle; Stroke  in her maternal grandfather and maternal grandmother.    ROS:  Please see the history of present illness.   Otherwise, review of systems are positive for leg cramping and pain, edema, headache, dizziness, reflux, tinnitus, decreased hearing, decreased appetite, constipation..   All other systems are reviewed and negative.    PHYSICAL EXAM: VS:  BP 140/62   Pulse 88   Ht 5\' 2"  (1.575 m)   Wt 150 lb (68 kg)   BMI 27.44 kg/m  , BMI Body mass index is 27.44 kg/m. GENERAL:  Well appearing HEENT:  Pupils equal round and reactive, fundi not visualized, oral mucosa unremarkable NECK:  No jugular venous distention, waveform within normal limits, carotid upstroke brisk and symmetric, no bruits, no thyromegaly LYMPHATICS:  No cervical, inguinal adenopathy LUNGS:  Clear to auscultation bilaterally BACK:  No CVA tenderness CHEST:  Unremarkable HEART:  PMI not displaced or sustained,S1 and S2 within normal limits, no S3, no S4, no clicks, no rubs, no murmurs ABD:  Flat, positive bowel sounds normal in frequency in pitch, no bruits, no rebound, no guarding, no midline pulsatile mass, no hepatomegaly, no splenomegaly EXT:  2 plus pulses throughout, no edema, no cyanosis no clubbing SKIN:  No rashes no nodules NEURO:  Cranial nerves II through XII grossly intact, motor grossly intact throughout PSYCH:  Cognitively intact, oriented to person place and time    EKG:  EKG is ordered today. The ekg ordered today demonstrates sinus rhythm, rate 88, left axis deviation, poor  anterior R wave progression, no acute ST-T wave changes.   Recent Labs: 01/27/2018: ALT 19; BUN 8; Creatinine, Ser 0.82; Hemoglobin 13.4; Platelets 202; Potassium 4.4; Sodium 138    Lipid Panel    Component Value Date/Time   CHOL 108 08/23/2017 1421   TRIG 62 08/23/2017 1421   HDL 55 08/23/2017 1421   CHOLHDL 2.0 08/23/2017 1421   CHOLHDL 2.5 12/03/2009 2100   VLDL 21 12/03/2009 2100   LDLCALC 41 08/23/2017 1421      Wt Readings from Last 3 Encounters:  02/15/18 150 lb (68 kg)  02/07/18 146 lb (66.2 kg)  01/27/18 145 lb 12.8 oz (66.1 kg)      Other studies Reviewed: Additional studies/ records that were reviewed today include: Hospital records. Review of the above records demonstrates:  Please see elsewhere in the note.     ASSESSMENT AND PLAN:  CHEST PAIN: There are some typical and atypical features.  She does have cardiovascular risk factors.  I would like for her to have screening stress testing but she would be able walk on a treadmill as she was not able to do this before so I am going to try to schedule a Lexiscan Myoview.  HTN:  The blood pressure is at target. No change in medications is indicated. We will continue with therapeutic lifestyle changes (TLC).     Current medicines are reviewed at length with the patient today.  The patient does not have concerns regarding medicines.  The following changes have been made:  no change  Labs/ tests ordered today include:   Orders Placed This Encounter  Procedures  . NM Myocar Multi W/Spect W/Wall Motion / EF  . EKG 12-Lead     Disposition:   FU with me as needed.     Signed, Minus Breeding, MD  02/15/2018 1:26 PM    [facility]

## 2018-02-15 ENCOUNTER — Ambulatory Visit (INDEPENDENT_AMBULATORY_CARE_PROVIDER_SITE_OTHER): Payer: Medicare Other | Admitting: Cardiology

## 2018-02-15 ENCOUNTER — Encounter: Payer: Self-pay | Admitting: Cardiology

## 2018-02-15 VITALS — BP 140/62 | HR 88 | Ht 62.0 in | Wt 150.0 lb

## 2018-02-15 DIAGNOSIS — R079 Chest pain, unspecified: Secondary | ICD-10-CM

## 2018-02-15 DIAGNOSIS — I10 Essential (primary) hypertension: Secondary | ICD-10-CM | POA: Diagnosis not present

## 2018-02-15 NOTE — Patient Instructions (Addendum)
Medication Instructions:  The current medical regimen is effective;  continue present plan and medications.  If you need a refill on your cardiac medications before your next appointment, please call your pharmacy.   Testing/Procedures: Your physician has requested that you have a lexiscan myoview. This test will be completed at [facility]. Please arrive at the Main Entrance Tuesday February 28, 2018 at 10 am.  Nothing to eat/drink after midnight, no caffeine 12 hours before.  You may take your normal medications with sips of water.  Please wear 2 piece comfortable clothing.  Please call [phone] if you need to reschedule or cancel.  Follow-Up: Follow up as needed after the above testing.  Thank you for choosing [facility]!!

## 2018-02-24 ENCOUNTER — Other Ambulatory Visit: Payer: Self-pay | Admitting: Family Medicine

## 2018-02-24 ENCOUNTER — Encounter: Payer: Self-pay | Admitting: Family Medicine

## 2018-02-24 ENCOUNTER — Ambulatory Visit (INDEPENDENT_AMBULATORY_CARE_PROVIDER_SITE_OTHER): Payer: Medicare Other | Admitting: Family Medicine

## 2018-02-24 ENCOUNTER — Other Ambulatory Visit: Payer: Self-pay | Admitting: *Deleted

## 2018-02-24 VITALS — BP 111/64 | HR 106 | Temp 97.8°F | Ht 62.0 in | Wt 142.8 lb

## 2018-02-24 DIAGNOSIS — K581 Irritable bowel syndrome with constipation: Secondary | ICD-10-CM

## 2018-02-24 DIAGNOSIS — K5732 Diverticulitis of large intestine without perforation or abscess without bleeding: Secondary | ICD-10-CM | POA: Diagnosis not present

## 2018-02-24 MED ORDER — DEXLANSOPRAZOLE 60 MG PO CPDR: DELAYED_RELEASE_CAPSULE | ORAL | 1 refills | Status: DC

## 2018-02-24 MED ORDER — CIPROFLOXACIN HCL 500 MG PO TABS: 500.0000 mg | ORAL_TABLET | Freq: Two times a day (BID) | ORAL | 0 refills | Status: DC

## 2018-02-24 MED ORDER — LINACLOTIDE 72 MCG PO CAPS: 72.0000 ug | ORAL_CAPSULE | Freq: Every day | ORAL | 2 refills | Status: DC

## 2018-02-24 MED ORDER — METRONIDAZOLE 500 MG PO TABS: 500.0000 mg | ORAL_TABLET | Freq: Two times a day (BID) | ORAL | 0 refills | Status: DC

## 2018-02-24 NOTE — Progress Notes (Signed)
Subjective:  Patient ID: Teresa [name], female    DOB: [birth year]  Age: 62 y.o. MRN: [id number]  CC: Diverticulitis (Patient c/o nausea and abd pain. x 2 days)   HPI [name] presents for increasing abdominal pain for the last couple of days.  She has not eaten anything unusual lately in fact she says she ate some pecan pie but she ate around the pecans and did not eat them.  She no longer eats corn.  However, this feels like her diverticulitis.  She points to the lower abdomen as the place where it hurts the most.  Somewhat to the left.  She is also been nauseous but has not vomited.  She had a bout of diarrhea a few days ago.  She has been using the Linzess apparently irregularly and had a large bowel movement that caused her to soil herself just 5 days ago.  She would like to have low dose 72 mg Linzess renewed.  She thinks that has been helpful preventing diverticulitis in the past along with her diet.  Depression screen [facility] 2/9 02/24/2018 02/07/2018 01/27/2018  Decreased Interest 1 1 1   Down, Depressed, Hopeless 1 0 0  PHQ - 2 Score 2 1 1   Altered sleeping 1 0 -  Tired, decreased energy 1 0 -  Change in appetite 1 0 -  Feeling bad or failure about yourself  0 0 -  Trouble concentrating 1 0 -  Moving slowly or fidgety/restless 0 0 -  Suicidal thoughts 0 0 -  PHQ-9 Score 6 1 -  Some recent data might be hidden    History Teresa [name] has a past medical history of Anxiety state, unspecified, Asthma, Chronic back pain greater than 3 months duration, Colon polyp, Degeneration of intervertebral disc, site unspecified, Depressive disorder, not elsewhere classified, Diverticulitis, Diverticulosis (07-08-2005), Esophageal stricture (2014), Full dentures, GERD (gastroesophageal reflux disease) (07-08-2005), Hiatal hernia (07-08-2005), Hypertension, Insomnia, Irritable bowel syndrome, Migraine, Muscle spasm, Osteoarthrosis, unspecified whether generalized or localized, lower leg, Papilloma of  breast (09/2011), Seasonal allergies, Seizures ([place]) (1990s), and Vocal cord polyp.   She has a past surgical history that includes Cholecystectomy; Bladder surgery; Rectal tumor by proctotomy excision; Cystoscopy (08/24/2011); Rectocele repair (08/24/2011); Vaginal prolapse repair (08/24/2011); Abdominal hysterectomy; Bilateral salpingoophorectomy; Other surgical history; Breast biopsy (11/03/2011); Colonoscopy (07/08/2005); Esophagogastroduodenoscopy (10/03/2012); Tumor removal; and rectocelle repair.   Her family history includes Diabetes in her maternal grandmother; Heart disease in her paternal grandmother; Irritable bowel syndrome in an other family member; Prostate cancer in her father; Sleep apnea in her father; Stomach cancer in her maternal uncle; Stroke in her maternal grandfather and maternal grandmother.She reports that she quit smoking about 26 years ago. She has never used smokeless tobacco. She reports current drug use. She reports that she does not drink alcohol.    ROS Review of Systems  Constitutional: Negative.   HENT: Negative for congestion.   Eyes: Negative for visual disturbance.  Respiratory: Negative for shortness of breath.   Cardiovascular: Negative for chest pain.  Gastrointestinal: Positive for abdominal pain, constipation and diarrhea. Negative for nausea and vomiting.  Genitourinary: Negative for difficulty urinating.  Musculoskeletal: Negative for arthralgias and myalgias.  Neurological: Negative for headaches.  Psychiatric/Behavioral: Negative for sleep disturbance.    Objective:  BP 111/64   Pulse (!) 106   Temp 97.8 F (36.6 C) (Oral)   Ht 5\' 2"  (1.575 m)   Wt 142 lb 12.8 oz (64.8 kg)   BMI 26.12 kg/m  BP Readings from Last 3 Encounters:  02/24/18 111/64  02/15/18 140/62  02/07/18 (!) 95/54    Wt Readings from Last 3 Encounters:  02/24/18 142 lb 12.8 oz (64.8 kg)  02/15/18 150 lb (68 kg)  02/07/18 146 lb (66.2 kg)     Physical  Exam Constitutional:      Appearance: She is well-developed.  HENT:     Head: Normocephalic and atraumatic.  Cardiovascular:     Rate and Rhythm: Normal rate and regular rhythm.     Heart sounds: No murmur.  Pulmonary:     Effort: Pulmonary effort is normal.     Breath sounds: Normal breath sounds.  Abdominal:     General: Bowel sounds are normal. There is distension.     Palpations: Abdomen is soft. There is no mass.     Tenderness: There is abdominal tenderness (LLQ). There is no guarding or rebound.  Skin:    General: Skin is warm and dry.  Neurological:     Mental Status: She is alert and oriented to person, place, and time.  Psychiatric:        Behavior: Behavior normal.       Assessment & Plan:   Nidhi was seen today for diverticulitis.  Diagnoses and all orders for this visit:  Diverticulitis of colon  Irritable bowel syndrome with constipation -     linaclotide (LINZESS) 72 MCG capsule; Take 1 capsule (72 mcg total) by mouth daily before breakfast.  Other orders -     ciprofloxacin (CIPRO) 500 MG tablet; Take 1 tablet (500 mg total) by mouth 2 (two) times daily. -     metroNIDAZOLE (FLAGYL) 500 MG tablet; Take 1 tablet (500 mg total) by mouth 2 (two) times daily.       I have changed Yelena L. Hofman's linaclotide. I am also having her start on ciprofloxacin and metroNIDAZOLE. Additionally, I am having her maintain her docusate sodium, cyclobenzaprine, Olopatadine HCl, ondansetron, buprenorphine, PROAIR HFA, SYMBICORT, montelukast, diclofenac sodium, ZTLIDO, mometasone, cetirizine, diazepam, oxymorphone, buPROPion, albuterol, lisinopril, and dexlansoprazole.  Allergies as of 02/24/2018      Reactions   Lyrica [pregabalin] Shortness Of Breath   Trazodone And Nefazodone Shortness Of Breath   Amitriptyline    Arthrotec [diclofenac-misoprostol] Other (See Comments)   Upset stomach   Keflex [cephalexin] Other (See Comments)   Unknown reaction   Naproxen  Other (See Comments)   Irritates stomach   Prozac [fluoxetine Hcl] Other (See Comments)   "makes me feel bad"   Remeron [mirtazapine] Other (See Comments)   "off balance"   Zolpidem Other (See Comments)   Other   Ambien [zolpidem Tartrate] Other (See Comments)   COULDN'T FUNCTION, STAYED SLEEPY      Medication List       Accurate as of February 24, 2018  3:00 PM. Always use your most recent med list.        buprenorphine 10 MCG/HR Ptwk patch Commonly known as:  BUTRANS - dosed mcg/hr Place 10 mcg onto the skin once a week.   buPROPion 300 MG 24 hr tablet Commonly known as:  WELLBUTRIN XL TAKE ONE (1) TABLET EACH DAY   cetirizine 10 MG tablet Commonly known as:  ZYRTEC TAKE ONE TABLET EVERY MORNING   ciprofloxacin 500 MG tablet Commonly known as:  CIPRO Take 1 tablet (500 mg total) by mouth 2 (two) times daily.   cyclobenzaprine 10 MG tablet Commonly known as:  FLEXERIL Take 10 mg by mouth 3 (three) times  daily as needed for muscle spasms.   dexlansoprazole 60 MG capsule Commonly known as:  DEXILANT TAKE ONE (1) CAPSULE EACH DAY   diazepam 10 MG tablet Commonly known as:  VALIUM Take 10 mg by mouth every 12 (twelve) hours as needed for anxiety.   diclofenac sodium 1 % Gel Commonly known as:  VOLTAREN Apply 1 application topically 2 (two) times daily as needed (pain).   docusate sodium 100 MG capsule Commonly known as:  COLACE Take 100-200 mg by mouth daily as needed for mild constipation.   linaclotide 72 MCG capsule Commonly known as:  LINZESS Take 1 capsule (72 mcg total) by mouth daily before breakfast.   lisinopril 10 MG tablet Commonly known as:  PRINIVIL,ZESTRIL Take 10 mg by mouth daily.   metroNIDAZOLE 500 MG tablet Commonly known as:  FLAGYL Take 1 tablet (500 mg total) by mouth 2 (two) times daily.   mometasone 50 MCG/ACT nasal spray Commonly known as:  NASONEX USE 2 SPRAYS IN EACH NOSTRIL ONCE DAILY   montelukast 10 MG tablet Commonly  known as:  SINGULAIR TAKE ONE (1) TABLET EACH DAY   Olopatadine HCl 0.2 % Soln Place 1 drop into both eyes daily as needed for irritation.   ondansetron 4 MG tablet Commonly known as:  ZOFRAN Take 4 mg by mouth every 8 (eight) hours as needed for nausea or vomiting.   oxymorphone 5 MG tablet Commonly known as:  OPANA Take 5 mg by mouth 2 (two) times daily.   PROAIR HFA 108 (90 Base) MCG/ACT inhaler Generic drug:  albuterol USE 1 TO 2 PUFFS EVERY 4 TO 6 HOURS AS NEEDED   albuterol (2.5 MG/3ML) 0.083% nebulizer solution Commonly known as:  PROVENTIL NEBULIZE 1 VIAL EVERY 6 HOURS AS NEEDED FOR SHORTNESS OF BREATH   SYMBICORT 160-4.5 MCG/ACT inhaler Generic drug:  budesonide-formoterol USE 2 PUFFS TWICE DAILY   ZTLIDO 1.8 % Ptch Generic drug:  Lidocaine Place 1 patch onto the skin 2 (two) times daily.        Follow-up: Return if symptoms worsen or fail to improve.  Also in one week with GI, as planned.  Claretta Fraise, M.D.

## 2018-02-27 ENCOUNTER — Encounter (HOSPITAL_COMMUNITY): Payer: Self-pay

## 2018-02-27 ENCOUNTER — Encounter (HOSPITAL_COMMUNITY)
Admission: RE | Admit: 2018-02-27 | Discharge: 2018-02-27 | Disposition: A | Discharge status: Home / Self Care | Payer: Medicare Other | Source: Ambulatory Visit | Attending: Internal Medicine | Admitting: Internal Medicine

## 2018-02-27 ENCOUNTER — Other Ambulatory Visit: Payer: Self-pay

## 2018-02-27 HISTORY — DX: Unspecified viral hepatitis C without hepatic coma: B19.20

## 2018-02-27 NOTE — Progress Notes (Signed)
Patient was placed on antibiotics last week for suspected diverticulitis by her family physician.  Dr Laural Golden and his office was notified.  Patients procedure will be cancelled and rescheduled in approximately 4 weeks.

## 2018-02-27 NOTE — Patient Instructions (Signed)
Teresa [name]  02/27/2018     @PREFPERIOPPHARMACY @   Your procedure is scheduled on  03/03/2018.  Report to [facility] at  800  A.M.  Call this number if you have problems the morning of surgery:  [phone]   Remember:  Follow the diet and prep instructions given to you by Dr Olevia Perches office.                       Take these medicines the morning of surgery with A SIP OF WATER  Wellbutrin,, oxymorphone( if needed), zyrtec, fleaxril, dexilant, valium ( if needed), diclofenac, lisinopril, singulair, zofran ( if needed). Use your nebulizer and your inhaler before you come.    Do not wear jewelry, make-up or nail polish.  Do not wear lotions, powders, or perfumes, or deodorant.  Do not shave 48 hours prior to surgery.  Men may shave face and neck.  Do not bring valuables to the hospital.  [facility] is not responsible for any belongings or valuables.  Contacts, dentures or bridgework may not be worn into surgery.  Leave your suitcase in the car.  After surgery it may be brought to your room.  For patients admitted to the hospital, discharge time will be determined by your treatment team.  Patients discharged the day of surgery will not be allowed to drive home.   Name and phone number of your driver:   family Special instructions:  None  Please read over the following fact sheets that you were given. Anesthesia Post-op Instructions and Care and Recovery After Surgery       Colonoscopy, Adult A colonoscopy is an exam to look at the large intestine. It is done to check for problems, such as:  Lumps (tumors).  Growths (polyps).  Swelling (inflammation).  Bleeding. What happens before the procedure? Eating and drinking Follow instructions from your doctor about eating and drinking. These instructions may include:  A few days before the procedure - follow a low-fiber diet. ? Avoid nuts. ? Avoid seeds. ? Avoid dried fruit. ? Avoid raw  fruits. ? Avoid vegetables.  1-3 days before the procedure - follow a clear liquid diet. Avoid liquids that have red or purple dye. Drink only clear liquids, such as: ? Clear broth or bouillon. ? Black coffee or tea. ? Clear juice. ? Clear soft drinks or sports drinks. ? Gelatin dessert. ? Popsicles.  On the day of the procedure - do not eat or drink anything during the 2 hours before the procedure. Up to 2 hours before the procedure, you may continue to drink clear liquids, such as water or clear fruit juice.  Bowel prep If you were prescribed an oral bowel prep:  Take it as told by your doctor. Starting the day before your procedure, you will need to drink a lot of liquid. The liquid will cause you to poop (have bowel movements) until your poop is almost clear or light green.  To clean out your colon, you may also be given: ? Laxative medicines. ? Instructions about how to use an enema.  If your skin or butt gets irritated from diarrhea, you may: ? Wipe the area with wipes that have medicine in them, such as adult wet wipes with aloe and vitamin E. ? Put something on your skin that soothes the area, such as petroleum jelly.  If you throw up (vomit) while drinking the bowel prep, take  a break for up to 60 minutes. Then begin the bowel prep again. If you keep throwing up and you cannot take the bowel prep without throwing up, call your doctor. General instructions  Ask your doctor about: ? Changing or stopping your normal medicines. This is important if you take iron pills, diabetes medicines, or blood thinners. ? Taking medicines such as aspirin and ibuprofen. These medicines can thin your blood. Do not take these medicines unless your doctor tells you to take them.  Plan to have someone take you home from the hospital or clinic. What happens during the procedure?   An IV tube may be put into one of your veins.  You will be given medicine to help you relax (sedative).  To  reduce your risk of infection: ? Your doctors will wash their hands. ? Your anal area will be washed with soap.  You will be asked to lie on your side with your knees bent.  Your doctor will get a long, thin, flexible tube ready. The tube will have a camera and a light on the end.  The tube will be put into your anus.  The tube will be gently put into your large intestine.  Air will be delivered into your large intestine to keep it open. You may feel some pressure or cramping.  The camera will be used to take photos.  A small tissue sample may be removed for testing (biopsy).  If small growths are found, your doctor may remove them and have them checked for cancer.  The tube that was put into your anus will be slowly removed. The procedure may vary among doctors and hospitals. What happens after the procedure?  Your doctor will check on you often until the medicines you were given have worn off.  Do not drive for 24 hours after the procedure.  You may have a small amount of blood in your poop.  You may pass gas.  You may have mild cramps or bloating in your belly (abdomen).  It is up to you to get the results of your procedure. Ask your doctor, or the department performing the procedure, when your results will be ready. Summary  A colonoscopy is an exam to look at the large intestine.  Follow instructions from your doctor about eating and drinking before the procedure.  If you were prescribed an oral bowel prep to clean out your colon, take it as told by your doctor.  Your doctor will check on you often until the medicines you were given have worn off.  Plan to have someone take you home from the hospital or clinic. This information is not intended to replace advice given to you by your health care provider. Make sure you discuss any questions you have with your health care provider. Document Released: 03/20/2010 Document Revised: 12/15/2016 Document Reviewed:  04/29/2015 Elsevier Interactive Patient Education  2019 [employer]  Colonoscopy, Adult, Care After This sheet gives you information about how to care for yourself after your procedure. Your health care provider may also give you more specific instructions. If you have problems or questions, contact your health care provider. What can I expect after the procedure? After the procedure, it is common to have:  A small amount of blood in your stool for 24 hours after the procedure.  Some gas.  Mild abdominal cramping or bloating. Follow these instructions at home: General instructions  For the first 24 hours after the procedure: ? Do not drive or  use machinery. ? Do not sign important documents. ? Do not drink alcohol. ? Do your regular daily activities at a slower pace than normal. ? Eat soft, easy-to-digest foods.  Take over-the-counter or prescription medicines only as told by your health care provider. Relieving cramping and bloating   Try walking around when you have cramps or feel bloated.  Apply heat to your abdomen as told by your health care provider. Use a heat source that your health care provider recommends, such as a moist heat pack or a heating pad. ? Place a towel between your skin and the heat source. ? Leave the heat on for 20-30 minutes. ? Remove the heat if your skin turns bright red. This is especially important if you are unable to feel pain, heat, or cold. You may have a greater risk of getting burned. Eating and drinking   Drink enough fluid to keep your urine pale yellow.  Resume your normal diet as instructed by your health care provider. Avoid heavy or fried foods that are hard to digest.  Avoid drinking alcohol for as long as instructed by your health care provider. Contact a health care provider if:  You have blood in your stool 2-3 days after the procedure. Get help right away if:  You have more than a small spotting of blood in your  stool.  You pass large blood clots in your stool.  Your abdomen is swollen.  You have nausea or vomiting.  You have a fever.  You have increasing abdominal pain that is not relieved with medicine. Summary  After the procedure, it is common to have a small amount of blood in your stool. You may also have mild abdominal cramping and bloating.  For the first 24 hours after the procedure, do not drive or use machinery, sign important documents, or drink alcohol.  Contact your health care provider if you have a lot of blood in your stool, nausea or vomiting, a fever, or increased abdominal pain. This information is not intended to replace advice given to you by your health care provider. Make sure you discuss any questions you have with your health care provider. Document Released: 09/30/2003 Document Revised: 12/08/2016 Document Reviewed: 04/29/2015 Elsevier Interactive Patient Education  2019 [place] Anesthesia is a term that refers to techniques, procedures, and medicines that help a person stay safe and comfortable during a medical procedure. Monitored anesthesia care, or sedation, is one type of anesthesia. Your anesthesia specialist may recommend sedation if you will be having a procedure that does not require you to be unconscious, such as:  Cataract surgery.  A dental procedure.  A biopsy.  A colonoscopy. During the procedure, you may receive a medicine to help you relax (sedative). There are three levels of sedation:  Mild sedation. At this level, you may feel awake and relaxed. You will be able to follow directions.  Moderate sedation. At this level, you will be sleepy. You may not remember the procedure.  Deep sedation. At this level, you will be asleep. You will not remember the procedure. The more medicine you are given, the deeper your level of sedation will be. Depending on how you respond to the procedure, the anesthesia specialist  may change your level of sedation or the type of anesthesia to fit your needs. An anesthesia specialist will monitor you closely during the procedure. Let your health care provider know about:  Any allergies you have.  All medicines you are taking,  including vitamins, herbs, eye drops, creams, and over-the-counter medicines.  Any use of steroids (by mouth or as a cream).  Any problems you or family members have had with sedatives and anesthetic medicines.  Any blood disorders you have.  Any surgeries you have had.  Any medical conditions you have, such as sleep apnea.  Whether you are pregnant or may be pregnant.  Any use of cigarettes, alcohol, or street drugs. What are the risks? Generally, this is a safe procedure. However, problems may occur, including:  Getting too much medicine (oversedation).  Nausea.  Allergic reaction to medicines.  Trouble breathing. If this happens, a breathing tube may be used to help with breathing. It will be removed when you are awake and breathing on your own.  Heart trouble.  Lung trouble. Before the procedure Staying hydrated Follow instructions from your health care provider about hydration, which may include:  Up to 2 hours before the procedure - you may continue to drink clear liquids, such as water, clear fruit juice, black coffee, and plain tea. Eating and drinking restrictions Follow instructions from your health care provider about eating and drinking, which may include:  8 hours before the procedure - stop eating heavy meals or foods such as meat, fried foods, or fatty foods.  6 hours before the procedure - stop eating light meals or foods, such as toast or cereal.  6 hours before the procedure - stop drinking milk or drinks that contain milk.  2 hours before the procedure - stop drinking clear liquids. Medicines Ask your health care provider about:  Changing or stopping your regular medicines. This is especially important  if you are taking diabetes medicines or blood thinners.  Taking medicines such as aspirin and ibuprofen. These medicines can thin your blood. Do not take these medicines before your procedure if your health care provider instructs you not to. Tests and exams  You will have a physical exam.  You may have blood tests done to show: ? How well your kidneys and liver are working. ? How well your blood can clot. General instructions  Plan to have someone take you home from the hospital or clinic.  If you will be going home right after the procedure, plan to have someone with you for 24 hours.  What happens during the procedure?  Your blood pressure, heart rate, breathing, level of pain and overall condition will be monitored.  An IV tube will be inserted into one of your veins.  Your anesthesia specialist will give you medicines as needed to keep you comfortable during the procedure. This may mean changing the level of sedation.  The procedure will be performed. After the procedure  Your blood pressure, heart rate, breathing rate, and blood oxygen level will be monitored until the medicines you were given have worn off.  Do not drive for 24 hours if you received a sedative.  You may: ? Feel sleepy, clumsy, or nauseous. ? Feel forgetful about what happened after the procedure. ? Have a sore throat if you had a breathing tube during the procedure. ? Vomit. This information is not intended to replace advice given to you by your health care provider. Make sure you discuss any questions you have with your health care provider. Document Released: 11/11/2004 Document Revised: 07/25/2015 Document Reviewed: 06/08/2015 Elsevier Interactive Patient Education  2019 [place], Care After These instructions provide you with information about caring for yourself after your procedure. Your health care provider may  also give you more specific instructions. Your  treatment has been planned according to current medical practices, but problems sometimes occur. Call your health care provider if you have any problems or questions after your procedure. What can I expect after the procedure? After your procedure, you may:  Feel sleepy for several hours.  Feel clumsy and have poor balance for several hours.  Feel forgetful about what happened after the procedure.  Have poor judgment for several hours.  Feel nauseous or vomit.  Have a sore throat if you had a breathing tube during the procedure. Follow these instructions at home: For at least 24 hours after the procedure:      Have a responsible adult stay with you. It is important to have someone help care for you until you are awake and alert.  Rest as needed.  Do not: ? Participate in activities in which you could fall or become injured. ? Drive. ? Use heavy machinery. ? Drink alcohol. ? Take sleeping pills or medicines that cause drowsiness. ? Make important decisions or sign legal documents. ? Take care of children on your own. Eating and drinking  Follow the diet that is recommended by your health care provider.  If you vomit, drink water, juice, or soup when you can drink without vomiting.  Make sure you have little or no nausea before eating solid foods. General instructions  Take over-the-counter and prescription medicines only as told by your health care provider.  If you have sleep apnea, surgery and certain medicines can increase your risk for breathing problems. Follow instructions from your health care provider about wearing your sleep device: ? Anytime you are sleeping, including during daytime naps. ? While taking prescription pain medicines, sleeping medicines, or medicines that make you drowsy.  If you smoke, do not smoke without supervision.  Keep all follow-up visits as told by your health care provider. This is important. Contact a health care provider  if:  You keep feeling nauseous or you keep vomiting.  You feel light-headed.  You develop a rash.  You have a fever. Get help right away if:  You have trouble breathing. Summary  For several hours after your procedure, you may feel sleepy and have poor judgment.  Have a responsible adult stay with you for at least 24 hours or until you are awake and alert. This information is not intended to replace advice given to you by your health care provider. Make sure you discuss any questions you have with your health care provider. Document Released: 06/08/2015 Document Revised: 10/01/2016 Document Reviewed: 06/08/2015 Elsevier Interactive Patient Education  2019 [employer].

## 2018-02-28 ENCOUNTER — Ambulatory Visit (HOSPITAL_COMMUNITY): Admission: RE | Admit: 2018-02-28 | Payer: Medicare Other | Source: Ambulatory Visit

## 2018-02-28 ENCOUNTER — Encounter (HOSPITAL_COMMUNITY): Payer: Self-pay

## 2018-03-02 ENCOUNTER — Other Ambulatory Visit (INDEPENDENT_AMBULATORY_CARE_PROVIDER_SITE_OTHER): Payer: Self-pay | Admitting: Internal Medicine

## 2018-03-02 ENCOUNTER — Encounter (INDEPENDENT_AMBULATORY_CARE_PROVIDER_SITE_OTHER): Payer: Self-pay | Admitting: *Deleted

## 2018-03-02 DIAGNOSIS — Z86010 Personal history of colonic polyps: Secondary | ICD-10-CM

## 2018-03-03 ENCOUNTER — Ambulatory Visit (HOSPITAL_COMMUNITY): Admission: RE | Admit: 2018-03-03 | Payer: Medicare Other | Source: Home / Self Care | Admitting: Internal Medicine

## 2018-03-03 ENCOUNTER — Encounter (HOSPITAL_COMMUNITY): Admission: RE | Payer: Self-pay | Source: Home / Self Care

## 2018-03-03 SURGERY — COLONOSCOPY WITH PROPOFOL
Anesthesia: Monitor Anesthesia Care

## 2018-03-10 ENCOUNTER — Encounter (HOSPITAL_COMMUNITY): Payer: Self-pay

## 2018-03-10 ENCOUNTER — Ambulatory Visit (HOSPITAL_COMMUNITY): Payer: Medicare Other

## 2018-03-13 ENCOUNTER — Ambulatory Visit: Payer: Medicare Other | Admitting: Family Medicine

## 2018-03-14 ENCOUNTER — Other Ambulatory Visit: Payer: Self-pay | Admitting: Nurse Practitioner

## 2018-03-14 ENCOUNTER — Encounter: Payer: Self-pay | Admitting: Nurse Practitioner

## 2018-03-14 ENCOUNTER — Ambulatory Visit (INDEPENDENT_AMBULATORY_CARE_PROVIDER_SITE_OTHER): Payer: Medicare Other | Admitting: Nurse Practitioner

## 2018-03-14 ENCOUNTER — Ambulatory Visit: Payer: Medicare Other | Admitting: Nurse Practitioner

## 2018-03-14 VITALS — BP 133/79 | HR 87 | Temp 97.3°F | Ht 62.0 in | Wt 143.0 lb

## 2018-03-14 DIAGNOSIS — B86 Scabies: Secondary | ICD-10-CM

## 2018-03-14 DIAGNOSIS — F331 Major depressive disorder, recurrent, moderate: Secondary | ICD-10-CM | POA: Diagnosis not present

## 2018-03-14 DIAGNOSIS — F339 Major depressive disorder, recurrent, unspecified: Secondary | ICD-10-CM | POA: Diagnosis not present

## 2018-03-14 MED ORDER — LINDANE 1 % EX LOTN: 1.0000 "application " | TOPICAL_LOTION | Freq: Once | CUTANEOUS | 0 refills | Status: DC

## 2018-03-14 MED ORDER — BUPROPION HCL ER (XL) 300 MG PO TB24: 300.0000 mg | ORAL_TABLET | Freq: Every day | ORAL | 1 refills | Status: DC

## 2018-03-14 MED ORDER — CITALOPRAM HYDROBROMIDE 20 MG PO TABS: 20.0000 mg | ORAL_TABLET | Freq: Every day | ORAL | 5 refills | Status: DC

## 2018-03-14 NOTE — Addendum Note (Signed)
Addended by: Chevis Pretty on: 03/14/2018 01:10 PM   Modules accepted: Orders

## 2018-03-14 NOTE — Progress Notes (Signed)
   Subjective:    Patient ID: Teresa [name], female    DOB: 23-Jul-1955, 63 y.o.   MRN: [id number]   Chief Complaint: Scabies (Wants to discuss wellbutrin)   HPI Patient comes in c/o: - rash all over body that is very itchy. started about 2 days ago. She has had scabies in the past and she says it feels the same. - she is on wellbutrin and she feels that she needs to up her meds. She is crying easily and does not feel like doing anything. Depression screen [facility] 2/9 02/24/2018 02/07/2018 01/27/2018  Decreased Interest 1 1 1   Down, Depressed, Hopeless 1 0 0  PHQ - 2 Score 2 1 1   Altered sleeping 1 0 -  Tired, decreased energy 1 0 -  Change in appetite 1 0 -  Feeling bad or failure about yourself  0 0 -  Trouble concentrating 1 0 -  Moving slowly or fidgety/restless 0 0 -  Suicidal thoughts 0 0 -  PHQ-9 Score 6 1 -  Some recent data might be hidden     Review of Systems  Constitutional: Negative.   Respiratory: Negative.   Cardiovascular: Negative.   Gastrointestinal: Negative.   Skin: Positive for rash (on chest ad right arm).  Neurological: Negative.   Psychiatric/Behavioral: Negative.   All other systems reviewed and are negative.      Objective:   Physical Exam Vitals signs and nursing note reviewed.  Constitutional:      Appearance: Normal appearance. She is normal weight.  Cardiovascular:     Rate and Rhythm: Normal rate and regular rhythm.     Pulses: Normal pulses.     Heart sounds: Normal heart sounds.  Pulmonary:     Effort: Pulmonary effort is normal.     Breath sounds: Normal breath sounds.  Skin:    General: Skin is warm.     Comments: scaley rash in linear pattern on right wrist and chest wall.  Neurological:     General: No focal deficit present.     Mental Status: She is alert and oriented to person, place, and time.  Psychiatric:        Mood and Affect: Mood normal.        Behavior: Behavior normal.    BP 133/79   Pulse 87   Temp (!) 97.3  F (36.3 C) (Oral)   Ht 5\' 2"  (1.575 m)   Wt 143 lb (64.9 kg)   BMI 26.16 kg/m         Assessment & Plan:  Teresa [name] in today with chief complaint of Scabies (Wants to discuss wellbutrin)   1. Scabies infestation Apply lotion to entire body and leave on for 8 hours then rinse - lindane lotion (KWELL) 1 %; Apply 1 application topically once for 1 dose.  Dispense: 30 mL; Refill: 0  2. Depression, recurrent ([place]) Continue wellbutrin Added celexa Stress management Follow up with pcp in 3 weeks - citalopram (CELEXA) 20 MG tablet; Take 1 tablet (20 mg total) by mouth daily.  Dispense: 30 tablet; Refill: [place], FNP

## 2018-03-14 NOTE — Patient Instructions (Signed)
Scabies, Adult  Scabies is a skin condition that happens when very small insects get under the skin (infestation). This causes a rash and severe itchiness. Scabies can spread from person to person (is contagious). If you get scabies, it is common for others in your household to get scabies too.  With proper treatment, symptoms usually go away in 2-4 weeks. Scabies usually does not cause lasting problems.  What are the causes?  This condition is caused by mites (Sarcoptes scabiei, or human itch mites) that can only be seen with a microscope. The mites get into the top layer of skin and lay eggs. Scabies can spread from person to person through:  · Close contact with a person who has scabies.  · Contact with infested items, such as towels, bedding, or clothing.  What increases the risk?  This condition is more likely to develop in:  · People who live in nursing homes and other extended-care facilities.  · People who have sexual contact with a partner who has scabies.  · Young children who attend child care facilities.  · People who care for others who are at increased risk for scabies.  What are the signs or symptoms?  Symptoms of this condition may include:  · Severe itchiness. This is often worse at night.  · A rash that includes tiny red bumps or blisters. The rash commonly occurs on the wrist, elbow, armpit, fingers, waist, groin, or buttocks. Bumps may form a line (burrow) in some areas.  · Skin irritation. This can include scaly patches or sores.  How is this diagnosed?  This condition is diagnosed with a physical exam. Your health care provider will look closely at your skin. In some cases, your health care provider may take a sample of your affected skin (skin scraping) and have it examined under a microscope.  How is this treated?  This condition may be treated with:  · Medicated cream or lotion that kills the mites. This is spread on the entire body and left on for several hours. Usually, one treatment with  medicated cream or lotion is enough to kill all of the mites. In severe cases, the treatment may be repeated.  · Medicated cream that relieves itching.  · Medicines that help to relieve itching.  · Medicines that kill the mites. This treatment is rarely used.  Follow these instructions at home:    Medicines  · Take or apply over-the-counter and prescription medicines as told by your health care provider.  · Apply medicated cream or lotion as told by your health care provider.  · Do not wash off the medicated cream or lotion until the necessary amount of time has passed.  Skin Care  · Avoid scratching your affected skin.  · Keep your fingernails closely trimmed to reduce injury from scratching.  · Take cool baths or apply cool washcloths to help reduce itching.  General instructions  · Clean all items that you recently had contact with, including bedding, clothing, and furniture. Do this on the same day that your treatment starts.  ? Use hot water when you wash items.  ? Place unwashable items into closed, airtight plastic bags for at least 3 days. The mites cannot live for more than 3 days away from human skin.  ? Vacuum furniture and mattresses that you use.  · Make sure that other people who may have been infested are examined by a health care provider. These include members of your household and anyone who   may have had contact with infested items.  · Keep all follow-up visits as told by your health care provider. This is important.  Contact a health care provider if:  · You have itching that does not go away after 4 weeks of treatment.  · You continue to develop new bumps or burrows.  · You have redness, swelling, or pain in your rash area after treatment.  · You have fluid, blood, or pus coming from your rash.  This information is not intended to replace advice given to you by your health care provider. Make sure you discuss any questions you have with your health care provider.  Document Released: 11/06/2014  Document Revised: 07/24/2015 Document Reviewed: 09/17/2014  Elsevier Interactive Patient Education © 2019 [employer]vier Inc.

## 2018-03-15 ENCOUNTER — Ambulatory Visit: Payer: Medicare Other | Admitting: Family Medicine

## 2018-03-17 ENCOUNTER — Encounter (HOSPITAL_COMMUNITY): Payer: Medicare Other

## 2018-03-17 ENCOUNTER — Encounter (HOSPITAL_COMMUNITY): Payer: Self-pay

## 2018-03-20 DIAGNOSIS — M545 Low back pain: Secondary | ICD-10-CM | POA: Diagnosis not present

## 2018-03-20 DIAGNOSIS — G5600 Carpal tunnel syndrome, unspecified upper limb: Secondary | ICD-10-CM | POA: Diagnosis not present

## 2018-03-20 DIAGNOSIS — M25569 Pain in unspecified knee: Secondary | ICD-10-CM | POA: Diagnosis not present

## 2018-03-20 DIAGNOSIS — Z79891 Long term (current) use of opiate analgesic: Secondary | ICD-10-CM | POA: Diagnosis not present

## 2018-03-21 ENCOUNTER — Encounter (HOSPITAL_COMMUNITY): Payer: Self-pay

## 2018-03-21 ENCOUNTER — Ambulatory Visit (HOSPITAL_COMMUNITY)
Admission: RE | Admit: 2018-03-21 | Discharge: 2018-03-21 | Disposition: A | Discharge status: Home / Self Care | Payer: Medicare Other | Source: Ambulatory Visit | Attending: Cardiology | Admitting: Cardiology

## 2018-03-21 ENCOUNTER — Encounter (HOSPITAL_COMMUNITY)
Admission: RE | Admit: 2018-03-21 | Discharge: 2018-03-21 | Disposition: A | Discharge status: Home / Self Care | Payer: Medicare Other | Source: Ambulatory Visit | Attending: Cardiology | Admitting: Cardiology

## 2018-03-21 DIAGNOSIS — R079 Chest pain, unspecified: Secondary | ICD-10-CM | POA: Insufficient documentation

## 2018-03-21 DIAGNOSIS — I10 Essential (primary) hypertension: Secondary | ICD-10-CM | POA: Insufficient documentation

## 2018-03-21 LAB — NM MYOCAR MULTI W/SPECT W/WALL MOTION / EF
LV dias vol: 55 mL (ref 46–106)
LVSYSVOL: 17 mL
NUC STRESS TID: 0.9
Peak HR: 97 {beats}/min
RATE: 0.41
Rest HR: 63 {beats}/min
SDS: 2
SRS: 0
SSS: 2

## 2018-03-21 MED ORDER — TECHNETIUM TC 99M TETROFOSMIN IV KIT
30.0000 | PACK | Freq: Once | INTRAVENOUS | Status: AC | PRN
  Administered 2018-03-21: 30 via INTRAVENOUS

## 2018-03-21 MED ORDER — REGADENOSON 0.4 MG/5ML IV SOLN
INTRAVENOUS | Status: AC
  Administered 2018-03-21: 0.4 mg via INTRAVENOUS
  Filled 2018-03-21: qty 5

## 2018-03-21 MED ORDER — TECHNETIUM TC 99M TETROFOSMIN IV KIT
10.0000 | PACK | Freq: Once | INTRAVENOUS | Status: AC | PRN
  Administered 2018-03-21: 10.4 via INTRAVENOUS

## 2018-03-21 MED ORDER — SODIUM CHLORIDE 0.9% FLUSH
INTRAVENOUS | Status: AC
  Administered 2018-03-21: 10 mL via INTRAVENOUS
  Filled 2018-03-21: qty 10

## 2018-03-22 ENCOUNTER — Other Ambulatory Visit: Payer: Self-pay | Admitting: Family Medicine

## 2018-03-22 DIAGNOSIS — J4540 Moderate persistent asthma, uncomplicated: Secondary | ICD-10-CM

## 2018-03-22 NOTE — Patient Instructions (Signed)
Teresa [name]  03/22/2018     @PREFPERIOPPHARMACY @   Your procedure is scheduled on  03/31/2018.  Report to [facility] at  700   A.M.  Call this number if you have problems the morning of surgery:  [phone]   Remember:  Follow the diet and prep instructions given to you by Dr Olevia Perches office.                    Take these medicines the morning of surgery with A SIP OF WATER wellbutrin, zyrtec, celexa, flexaril ( if needed), dexilant, valium ( if needed), lisinopril, Zofran ( if needed). Use your nebulizer and your inhaler before you come.    Do not wear jewelry, make-up or nail polish.  Do not wear lotions, powders, or perfumes, or deodorant.  Do not shave 48 hours prior to surgery.  Men may shave face and neck.  Do not bring valuables to the hospital.  [facility] is not responsible for any belongings or valuables.  Contacts, dentures or bridgework may not be worn into surgery.  Leave your suitcase in the car.  After surgery it may be brought to your room.  For patients admitted to the hospital, discharge time will be determined by your treatment team.  Patients discharged the day of surgery will not be allowed to drive home.   Name and phone number of your driver:   family Special instructions:  None  Please read over the following fact sheets that you were given. Anesthesia Post-op Instructions and Care and Recovery After Surgery       Colonoscopy, Adult A colonoscopy is an exam to look at the large intestine. It is done to check for problems, such as:  Lumps (tumors).  Growths (polyps).  Swelling (inflammation).  Bleeding. What happens before the procedure? Eating and drinking Follow instructions from your doctor about eating and drinking. These instructions may include:  A few days before the procedure - follow a low-fiber diet. ? Avoid nuts. ? Avoid seeds. ? Avoid dried fruit. ? Avoid raw fruits. ? Avoid vegetables.  1-3 days  before the procedure - follow a clear liquid diet. Avoid liquids that have red or purple dye. Drink only clear liquids, such as: ? Clear broth or bouillon. ? Black coffee or tea. ? Clear juice. ? Clear soft drinks or sports drinks. ? Gelatin dessert. ? Popsicles.  On the day of the procedure - do not eat or drink anything during the 2 hours before the procedure. Up to 2 hours before the procedure, you may continue to drink clear liquids, such as water or clear fruit juice.  Bowel prep If you were prescribed an oral bowel prep:  Take it as told by your doctor. Starting the day before your procedure, you will need to drink a lot of liquid. The liquid will cause you to poop (have bowel movements) until your poop is almost clear or light green.  To clean out your colon, you may also be given: ? Laxative medicines. ? Instructions about how to use an enema.  If your skin or butt gets irritated from diarrhea, you may: ? Wipe the area with wipes that have medicine in them, such as adult wet wipes with aloe and vitamin E. ? Put something on your skin that soothes the area, such as petroleum jelly.  If you throw up (vomit) while drinking the bowel prep, take a break for up  to 60 minutes. Then begin the bowel prep again. If you keep throwing up and you cannot take the bowel prep without throwing up, call your doctor. General instructions  Ask your doctor about: ? Changing or stopping your normal medicines. This is important if you take iron pills, diabetes medicines, or blood thinners. ? Taking medicines such as aspirin and ibuprofen. These medicines can thin your blood. Do not take these medicines unless your doctor tells you to take them.  Plan to have someone take you home from the hospital or clinic. What happens during the procedure?   An IV tube may be put into one of your veins.  You will be given medicine to help you relax (sedative).  To reduce your risk of infection: ? Your  doctors will wash their hands. ? Your anal area will be washed with soap.  You will be asked to lie on your side with your knees bent.  Your doctor will get a long, thin, flexible tube ready. The tube will have a camera and a light on the end.  The tube will be put into your anus.  The tube will be gently put into your large intestine.  Air will be delivered into your large intestine to keep it open. You may feel some pressure or cramping.  The camera will be used to take photos.  A small tissue sample may be removed for testing (biopsy).  If small growths are found, your doctor may remove them and have them checked for cancer.  The tube that was put into your anus will be slowly removed. The procedure may vary among doctors and hospitals. What happens after the procedure?  Your doctor will check on you often until the medicines you were given have worn off.  Do not drive for 24 hours after the procedure.  You may have a small amount of blood in your poop.  You may pass gas.  You may have mild cramps or bloating in your belly (abdomen).  It is up to you to get the results of your procedure. Ask your doctor, or the department performing the procedure, when your results will be ready. Summary  A colonoscopy is an exam to look at the large intestine.  Follow instructions from your doctor about eating and drinking before the procedure.  If you were prescribed an oral bowel prep to clean out your colon, take it as told by your doctor.  Your doctor will check on you often until the medicines you were given have worn off.  Plan to have someone take you home from the hospital or clinic. This information is not intended to replace advice given to you by your health care provider. Make sure you discuss any questions you have with your health care provider. Document Released: 03/20/2010 Document Revised: 12/15/2016 Document Reviewed: 04/29/2015 Elsevier Interactive Patient  Education  2019 [employer]  Colonoscopy, Adult, Care After This sheet gives you information about how to care for yourself after your procedure. Your health care provider may also give you more specific instructions. If you have problems or questions, contact your health care provider. What can I expect after the procedure? After the procedure, it is common to have:  A small amount of blood in your stool for 24 hours after the procedure.  Some gas.  Mild abdominal cramping or bloating. Follow these instructions at home: General instructions  For the first 24 hours after the procedure: ? Do not drive or use machinery. ? Do  not sign important documents. ? Do not drink alcohol. ? Do your regular daily activities at a slower pace than normal. ? Eat soft, easy-to-digest foods.  Take over-the-counter or prescription medicines only as told by your health care provider. Relieving cramping and bloating   Try walking around when you have cramps or feel bloated.  Apply heat to your abdomen as told by your health care provider. Use a heat source that your health care provider recommends, such as a moist heat pack or a heating pad. ? Place a towel between your skin and the heat source. ? Leave the heat on for 20-30 minutes. ? Remove the heat if your skin turns bright red. This is especially important if you are unable to feel pain, heat, or cold. You may have a greater risk of getting burned. Eating and drinking   Drink enough fluid to keep your urine pale yellow.  Resume your normal diet as instructed by your health care provider. Avoid heavy or fried foods that are hard to digest.  Avoid drinking alcohol for as long as instructed by your health care provider. Contact a health care provider if:  You have blood in your stool 2-3 days after the procedure. Get help right away if:  You have more than a small spotting of blood in your stool.  You pass large blood clots in your  stool.  Your abdomen is swollen.  You have nausea or vomiting.  You have a fever.  You have increasing abdominal pain that is not relieved with medicine. Summary  After the procedure, it is common to have a small amount of blood in your stool. You may also have mild abdominal cramping and bloating.  For the first 24 hours after the procedure, do not drive or use machinery, sign important documents, or drink alcohol.  Contact your health care provider if you have a lot of blood in your stool, nausea or vomiting, a fever, or increased abdominal pain. This information is not intended to replace advice given to you by your health care provider. Make sure you discuss any questions you have with your health care provider. Document Released: 09/30/2003 Document Revised: 12/08/2016 Document Reviewed: 04/29/2015 Elsevier Interactive Patient Education  2019 [place] Anesthesia is a term that refers to techniques, procedures, and medicines that help a person stay safe and comfortable during a medical procedure. Monitored anesthesia care, or sedation, is one type of anesthesia. Your anesthesia specialist may recommend sedation if you will be having a procedure that does not require you to be unconscious, such as:  Cataract surgery.  A dental procedure.  A biopsy.  A colonoscopy. During the procedure, you may receive a medicine to help you relax (sedative). There are three levels of sedation:  Mild sedation. At this level, you may feel awake and relaxed. You will be able to follow directions.  Moderate sedation. At this level, you will be sleepy. You may not remember the procedure.  Deep sedation. At this level, you will be asleep. You will not remember the procedure. The more medicine you are given, the deeper your level of sedation will be. Depending on how you respond to the procedure, the anesthesia specialist may change your level of sedation or the type  of anesthesia to fit your needs. An anesthesia specialist will monitor you closely during the procedure. Let your health care provider know about:  Any allergies you have.  All medicines you are taking, including vitamins, herbs, eye  drops, creams, and over-the-counter medicines.  Any use of steroids (by mouth or as a cream).  Any problems you or family members have had with sedatives and anesthetic medicines.  Any blood disorders you have.  Any surgeries you have had.  Any medical conditions you have, such as sleep apnea.  Whether you are pregnant or may be pregnant.  Any use of cigarettes, alcohol, or street drugs. What are the risks? Generally, this is a safe procedure. However, problems may occur, including:  Getting too much medicine (oversedation).  Nausea.  Allergic reaction to medicines.  Trouble breathing. If this happens, a breathing tube may be used to help with breathing. It will be removed when you are awake and breathing on your own.  Heart trouble.  Lung trouble. Before the procedure Staying hydrated Follow instructions from your health care provider about hydration, which may include:  Up to 2 hours before the procedure - you may continue to drink clear liquids, such as water, clear fruit juice, black coffee, and plain tea. Eating and drinking restrictions Follow instructions from your health care provider about eating and drinking, which may include:  8 hours before the procedure - stop eating heavy meals or foods such as meat, fried foods, or fatty foods.  6 hours before the procedure - stop eating light meals or foods, such as toast or cereal.  6 hours before the procedure - stop drinking milk or drinks that contain milk.  2 hours before the procedure - stop drinking clear liquids. Medicines Ask your health care provider about:  Changing or stopping your regular medicines. This is especially important if you are taking diabetes medicines or blood  thinners.  Taking medicines such as aspirin and ibuprofen. These medicines can thin your blood. Do not take these medicines before your procedure if your health care provider instructs you not to. Tests and exams  You will have a physical exam.  You may have blood tests done to show: ? How well your kidneys and liver are working. ? How well your blood can clot. General instructions  Plan to have someone take you home from the hospital or clinic.  If you will be going home right after the procedure, plan to have someone with you for 24 hours.  What happens during the procedure?  Your blood pressure, heart rate, breathing, level of pain and overall condition will be monitored.  An IV tube will be inserted into one of your veins.  Your anesthesia specialist will give you medicines as needed to keep you comfortable during the procedure. This may mean changing the level of sedation.  The procedure will be performed. After the procedure  Your blood pressure, heart rate, breathing rate, and blood oxygen level will be monitored until the medicines you were given have worn off.  Do not drive for 24 hours if you received a sedative.  You may: ? Feel sleepy, clumsy, or nauseous. ? Feel forgetful about what happened after the procedure. ? Have a sore throat if you had a breathing tube during the procedure. ? Vomit. This information is not intended to replace advice given to you by your health care provider. Make sure you discuss any questions you have with your health care provider. Document Released: 11/11/2004 Document Revised: 07/25/2015 Document Reviewed: 06/08/2015 Elsevier Interactive Patient Education  2019 [place], Care After These instructions provide you with information about caring for yourself after your procedure. Your health care provider may also give you more  specific instructions. Your treatment has been planned according to current  medical practices, but problems sometimes occur. Call your health care provider if you have any problems or questions after your procedure. What can I expect after the procedure? After your procedure, you may:  Feel sleepy for several hours.  Feel clumsy and have poor balance for several hours.  Feel forgetful about what happened after the procedure.  Have poor judgment for several hours.  Feel nauseous or vomit.  Have a sore throat if you had a breathing tube during the procedure. Follow these instructions at home: For at least 24 hours after the procedure:      Have a responsible adult stay with you. It is important to have someone help care for you until you are awake and alert.  Rest as needed.  Do not: ? Participate in activities in which you could fall or become injured. ? Drive. ? Use heavy machinery. ? Drink alcohol. ? Take sleeping pills or medicines that cause drowsiness. ? Make important decisions or sign legal documents. ? Take care of children on your own. Eating and drinking  Follow the diet that is recommended by your health care provider.  If you vomit, drink water, juice, or soup when you can drink without vomiting.  Make sure you have little or no nausea before eating solid foods. General instructions  Take over-the-counter and prescription medicines only as told by your health care provider.  If you have sleep apnea, surgery and certain medicines can increase your risk for breathing problems. Follow instructions from your health care provider about wearing your sleep device: ? Anytime you are sleeping, including during daytime naps. ? While taking prescription pain medicines, sleeping medicines, or medicines that make you drowsy.  If you smoke, do not smoke without supervision.  Keep all follow-up visits as told by your health care provider. This is important. Contact a health care provider if:  You keep feeling nauseous or you keep  vomiting.  You feel light-headed.  You develop a rash.  You have a fever. Get help right away if:  You have trouble breathing. Summary  For several hours after your procedure, you may feel sleepy and have poor judgment.  Have a responsible adult stay with you for at least 24 hours or until you are awake and alert. This information is not intended to replace advice given to you by your health care provider. Make sure you discuss any questions you have with your health care provider. Document Released: 06/08/2015 Document Revised: 10/01/2016 Document Reviewed: 06/08/2015 Elsevier Interactive Patient Education  2019 [employer].

## 2018-03-24 ENCOUNTER — Ambulatory Visit (HOSPITAL_COMMUNITY)
Admission: RE | Admit: 2018-03-24 | Discharge: 2018-03-24 | Disposition: A | Discharge status: Home / Self Care | Payer: Medicare Other | Source: Ambulatory Visit | Attending: Internal Medicine | Admitting: Internal Medicine

## 2018-03-24 ENCOUNTER — Encounter (HOSPITAL_COMMUNITY): Payer: Self-pay

## 2018-03-27 ENCOUNTER — Encounter (INDEPENDENT_AMBULATORY_CARE_PROVIDER_SITE_OTHER): Payer: Self-pay | Admitting: *Deleted

## 2018-03-27 ENCOUNTER — Telehealth (INDEPENDENT_AMBULATORY_CARE_PROVIDER_SITE_OTHER): Payer: Self-pay | Admitting: Internal Medicine

## 2018-03-27 NOTE — Telephone Encounter (Signed)
TCS sch'd to 04/28/18 at 730, preop 2/21, patient aware, instructions mailed

## 2018-03-27 NOTE — Telephone Encounter (Signed)
Patient called wanted to make sure her procedure was cancelled - also stated she would like to reschedule  -  Ph# [phone]

## 2018-04-03 ENCOUNTER — Other Ambulatory Visit: Payer: Self-pay | Admitting: Family Medicine

## 2018-04-06 ENCOUNTER — Ambulatory Visit: Payer: Medicare Other | Admitting: Family Medicine

## 2018-04-07 ENCOUNTER — Encounter: Payer: Self-pay | Admitting: Family

## 2018-04-07 ENCOUNTER — Ambulatory Visit (INDEPENDENT_AMBULATORY_CARE_PROVIDER_SITE_OTHER): Payer: Medicare Other | Admitting: Family

## 2018-04-07 VITALS — BP 103/69 | HR 108 | Temp 97.2°F | Ht 62.0 in | Wt 143.4 lb

## 2018-04-07 DIAGNOSIS — L299 Pruritus, unspecified: Secondary | ICD-10-CM | POA: Diagnosis not present

## 2018-04-07 DIAGNOSIS — R21 Rash and other nonspecific skin eruption: Secondary | ICD-10-CM

## 2018-04-07 MED ORDER — TRIAMCINOLONE ACETONIDE 0.5 % EX OINT: 1.0000 "application " | TOPICAL_OINTMENT | Freq: Two times a day (BID) | CUTANEOUS | 0 refills | Status: DC

## 2018-04-07 NOTE — Progress Notes (Signed)
Subjective:    Patient ID: Teresa [name], female    DOB: 1955-10-07, 63 y.o.   MRN: [id number]  Chief Complaint  Patient presents with  . rash follow up   PT presents to the office today with recurrent rash on bilateral arms. She was diagnosed with scabies and given lindane !%. She states she thought rash was better, but got a chair from her aunts and has given neighbors rides recently and noticed the rash appeared again.   She has been treated multiple times for scabies over the year.  Rash  This is a recurrent problem. The current episode started 1 to 4 weeks ago. The problem has been waxing and waning since onset. The affected locations include the left arm and right arm. The rash is characterized by itchiness and redness. Pertinent negatives include no congestion, cough, diarrhea, joint pain, shortness of breath, sore throat or vomiting.      Review of Systems  HENT: Negative for congestion and sore throat.   Respiratory: Negative for cough and shortness of breath.   Gastrointestinal: Negative for diarrhea and vomiting.  Musculoskeletal: Negative for joint pain.  Skin: Positive for rash.  All other systems reviewed and are negative.      Objective:   Physical Exam Vitals signs reviewed.  Constitutional:      General: She is not in acute distress.    Appearance: She is well-developed.  HENT:     Head: Normocephalic and atraumatic.  Eyes:     Pupils: Pupils are equal, round, and reactive to light.  Neck:     Musculoskeletal: Normal range of motion and neck supple.     Thyroid: No thyromegaly.  Cardiovascular:     Rate and Rhythm: Normal rate and regular rhythm.     Heart sounds: Normal heart sounds. No murmur.  Pulmonary:     Effort: Pulmonary effort is normal. No respiratory distress.     Breath sounds: Normal breath sounds. No wheezing.  Abdominal:     General: Bowel sounds are normal. There is no distension.     Palpations: Abdomen is soft.     Tenderness:  There is no abdominal tenderness.  Musculoskeletal: Normal range of motion.        General: No tenderness.  Skin:    General: Skin is warm and dry.     Findings: Erythema and rash present.     Comments: Mild erythemas rash on bilateral forearms.   Neurological:     Mental Status: She is alert and oriented to person, place, and time.     Cranial Nerves: No cranial nerve deficit.     Deep Tendon Reflexes: Reflexes are normal and symmetric.  Psychiatric:        Behavior: Behavior normal.        Thought Content: Thought content normal.        Judgment: Judgment normal.     BP 103/69   Pulse (!) 108   Temp (!) 97.2 F (36.2 C) (Oral)   Ht 5\' 2"  (1.575 m)   Wt 143 lb 6.4 oz (65 kg)   BMI 26.23 kg/m      Assessment & Plan:  Teresa [name] comes in today with chief complaint of rash follow up   Diagnosis and orders addressed:  1. Pruritus - Ambulatory referral to Dermatology  2. Rash and nonspecific skin eruption Do not scratch Avoid allergens Referral to dermatologists pending RTO if symptoms worsen or do not improve  - triamcinolone ointment (  KENALOG) 0.5 %; Apply 1 application topically 2 (two) times daily.  Dispense: 30 g; Refill: 0 - Ambulatory referral to Dermatology  Teresa Dun, FNP

## 2018-04-07 NOTE — Patient Instructions (Signed)
Rash, Adult  A rash is a change in the color of your skin. A rash can also change the way your skin feels. There are many different conditions and factors that can cause a rash. Some rashes may disappear after a few days, but some may last for a few weeks. Common causes of rashes include:   Viral infections, such as:  ? Colds.  ? Measles.  ? Hand, foot, and mouth disease.   Bacterial infections, such as:  ? Scarlet fever.  ? Impetigo.   Fungal infections, such as Candida.   Allergic reactions to food, medicines, or skin care products.  Follow these instructions at home:  The goal of treatment is to stop the itching and keep the rash from spreading. Pay attention to any changes in your symptoms. Follow these instructions to help with your condition:  Medicine  Take or apply over-the-counter and prescription medicines only as told by your health care provider. These may include:   Corticosteroid creams to treat red or swollen skin.   Anti-itch lotions.   Oral allergy medicines (antihistamines).   Oral corticosteroids for severe symptoms.    Skin care   Apply cool compresses to the affected areas.   Do not scratch or rub your skin.   Avoid covering the rash. Make sure the rash is exposed to air as much as possible.  Managing itching and discomfort   Avoid hot showers or baths, which can make itching worse. A cold shower may help.   Try taking a bath with:  ? Epsom salts. Follow manufacturer instructions on the packaging. You can get these at your local pharmacy or grocery store.  ? Baking soda. Pour a small amount into the bath as told by your health care provider.  ? Colloidal oatmeal. Follow manufacturer instructions on the packaging. You can get this at your local pharmacy or grocery store.   Try applying baking soda paste to your skin. Stir water into baking soda until it reaches a paste-like consistency.   Try applying calamine lotion. This is an over-the-counter lotion that helps to relieve  itchiness.   Keep cool and out of the sun. Sweating and being hot can make itching worse.  General instructions     Rest as needed.   Drink enough fluid to keep your urine pale yellow.   Wear loose-fitting clothing.   Avoid scented soaps, detergents, and perfumes. Use gentle soaps, detergents, perfumes, and other cosmetic products.   Avoid any substance that causes your rash. Keep a journal to help track what causes your rash. Write down:  ? What you eat.  ? What cosmetic products you use.  ? What you drink.  ? What you wear. This includes jewelry.   Keep all follow-up visits as told by your health care provider. This is important.  Contact a health care provider if:   You sweat at night.   You lose weight.   You urinate more than normal.   You urinate less than normal, or you notice that your urine is a darker color than usual.   You feel weak.   You vomit.   Your skin or the whites of your eyes look yellow (jaundice).   Your skin:  ? Tingles.  ? Is numb.   Your rash:  ? Does not go away after several days.  ? Gets worse.   You are:  ? Unusually thirsty.  ? More tired than normal.   You have:  ? New symptoms.  ?   Pain in your abdomen.  ? A fever.  ? Diarrhea.  Get help right away if you:   Have a fever and your symptoms suddenly get worse.   Develop confusion.   Have a severe headache or a stiff neck.   Have severe joint pains or stiffness.   Have a seizure.   Develop a rash that covers all or most of your body. The rash may or may not be painful.   Develop blisters that:  ? Are on top of the rash.  ? Grow larger or grow together.  ? Are painful.  ? Are inside your nose or mouth.   Develop a rash that:  ? Looks like purple pinprick-sized spots all over your body.  ? Has a "bull's eye" or looks like a target.  ? Is not related to sun exposure, is red and painful, and causes your skin to peel.  Summary   A rash is a change in the color of your skin. Some rashes disappear after a few days,  but some may last for a few weeks.   The goal of treatment is to stop the itching and keep the rash from spreading.   Take or apply over-the-counter and prescription medicines only as told by your health care provider.   Contact a health care provider if you have new or worsening symptoms.   Keep all follow-up visits as told by your health care provider. This is important.  This information is not intended to replace advice given to you by your health care provider. Make sure you discuss any questions you have with your health care provider.  Document Released: 02/05/2002 Document Revised: 09/19/2017 Document Reviewed: 09/19/2017  Elsevier Interactive Patient Education  2019 Elsevier Inc[employer]

## 2018-04-11 ENCOUNTER — Ambulatory Visit: Payer: Medicare Other | Admitting: Family Medicine

## 2018-04-18 NOTE — Patient Instructions (Signed)
Teresa [name]  04/18/2018     @PREFPERIOPPHARMACY @   Your procedure is scheduled on  04/28/2018.  Report to [facility] at  615   A.M.  Call this number if you have problems the morning of surgery:  [phone]   Remember:  Follow the diet and prep instructions given to you by Dr Olevia Perches office.                    Take these medicines the morning of surgery with A SIP OF WATER  Wellbutrin, zyrtec, celexa, flexaril ( if needed), dexilant, valium ( if needed), singulair, zofran ( if needed), opana( if needed). Use your nebulizer and your inhaler before you come.    Do not wear jewelry, make-up or nail polish.  Do not wear lotions, powders, or perfumes, or deodorant.  Do not shave 48 hours prior to surgery.  Men may shave face and neck.  Do not bring valuables to the hospital.  [facility] is not responsible for any belongings or valuables.  Contacts, dentures or bridgework may not be worn into surgery.  Leave your suitcase in the car.  After surgery it may be brought to your room.  For patients admitted to the hospital, discharge time will be determined by your treatment team.  Patients discharged the day of surgery will not be allowed to drive home.   Name and phone number of your driver:   family Special instructions:  None  Please read over the following fact sheets that you were given. Anesthesia Post-op Instructions and Care and Recovery After Surgery        Colonoscopy, Adult A colonoscopy is an exam to look at the large intestine. It is done to check for problems, such as:  Lumps (tumors).  Growths (polyps).  Swelling (inflammation).  Bleeding. What happens before the procedure? Eating and drinking Follow instructions from your doctor about eating and drinking. These instructions may include:  A few days before the procedure - follow a low-fiber diet. ? Avoid nuts. ? Avoid seeds. ? Avoid dried fruit. ? Avoid raw fruits. ? Avoid  vegetables.  1-3 days before the procedure - follow a clear liquid diet. Avoid liquids that have red or purple dye. Drink only clear liquids, such as: ? Clear broth or bouillon. ? Black coffee or tea. ? Clear juice. ? Clear soft drinks or sports drinks. ? Gelatin dessert. ? Popsicles.  Bowel prep If you were prescribed an oral bowel prep:  Take it as told by your doctor. Starting the day before your procedure, you will need to drink a lot of liquid. The liquid will cause you to poop (have bowel movements) until your poop is almost clear or light green.  To clean out your colon, you may also be given: ? Laxative medicines. ? Instructions about how to use an enema.  If your skin or butt gets irritated from diarrhea, you may: ? Wipe the area with wipes that have medicine in them, such as adult wet wipes with aloe and vitamin E. ? Put something on your skin that soothes the area, such as petroleum jelly.  If you throw up (vomit) while drinking the bowel prep, take a break for up to 60 minutes. Then begin the bowel prep again. If you keep throwing up and you cannot take the bowel prep without throwing up, call your doctor. General instructions  Ask your doctor about: ?  Changing or stopping your normal medicines. This is important if you take iron pills, diabetes medicines, or blood thinners. ? Taking medicines such as aspirin and ibuprofen. These medicines can thin your blood. Do not take these medicines unless your doctor tells you to take them.  Plan to have someone take you home from the hospital or clinic. What happens during the procedure?   An IV tube may be put into one of your veins.  You will be given medicine to help you relax (sedative).  To reduce your risk of infection: ? Your doctors will wash their hands. ? Your anal area will be washed with soap.  You will be asked to lie on your side with your knees bent.  Your doctor will get a long, thin, flexible tube ready.  The tube will have a camera and a light on the end.  The tube will be put into your anus.  The tube will be gently put into your large intestine.  Air will be delivered into your large intestine to keep it open. You may feel some pressure or cramping.  The camera will be used to take photos.  A small tissue sample may be removed for testing (biopsy).  If small growths are found, your doctor may remove them and have them checked for cancer.  The tube that was put into your anus will be slowly removed. The procedure may vary among doctors and hospitals. What happens after the procedure?  Your doctor will check on you often until the medicines you were given have worn off.  Do not drive for 24 hours after the procedure.  You may have a small amount of blood in your poop.  You may pass gas.  You may have mild cramps or bloating in your belly (abdomen).  It is up to you to get the results of your procedure. Ask your doctor, or the department performing the procedure, when your results will be ready. Summary  A colonoscopy is an exam to look at the large intestine.  Follow instructions from your doctor about eating and drinking before the procedure.  If you were prescribed an oral bowel prep to clean out your colon, take it as told by your doctor.  Your doctor will check on you often until the medicines you were given have worn off.  Plan to have someone take you home from the hospital or clinic. This information is not intended to replace advice given to you by your health care provider. Make sure you discuss any questions you have with your health care provider. Document Released: 03/20/2010 Document Revised: 12/15/2016 Document Reviewed: 04/29/2015 Elsevier Interactive Patient Education  2019 [employer]  Colonoscopy, Adult, Care After This sheet gives you information about how to care for yourself after your procedure. Your health care provider may also give you more  specific instructions. If you have problems or questions, contact your health care provider. What can I expect after the procedure? After the procedure, it is common to have:  A small amount of blood in your stool for 24 hours after the procedure.  Some gas.  Mild abdominal cramping or bloating. Follow these instructions at home: General instructions  For the first 24 hours after the procedure: ? Do not drive or use machinery. ? Do not sign important documents. ? Do not drink alcohol. ? Do your regular daily activities at a slower pace than normal. ? Eat soft, easy-to-digest foods.  Take over-the-counter or prescription medicines only as told  by your health care provider. Relieving cramping and bloating   Try walking around when you have cramps or feel bloated.  Apply heat to your abdomen as told by your health care provider. Use a heat source that your health care provider recommends, such as a moist heat pack or a heating pad. ? Place a towel between your skin and the heat source. ? Leave the heat on for 20-30 minutes. ? Remove the heat if your skin turns bright red. This is especially important if you are unable to feel pain, heat, or cold. You may have a greater risk of getting burned. Eating and drinking   Drink enough fluid to keep your urine pale yellow.  Resume your normal diet as instructed by your health care provider. Avoid heavy or fried foods that are hard to digest.  Avoid drinking alcohol for as long as instructed by your health care provider. Contact a health care provider if:  You have blood in your stool 2-3 days after the procedure. Get help right away if:  You have more than a small spotting of blood in your stool.  You pass large blood clots in your stool.  Your abdomen is swollen.  You have nausea or vomiting.  You have a fever.  You have increasing abdominal pain that is not relieved with medicine. Summary  After the procedure, it is  common to have a small amount of blood in your stool. You may also have mild abdominal cramping and bloating.  For the first 24 hours after the procedure, do not drive or use machinery, sign important documents, or drink alcohol.  Contact your health care provider if you have a lot of blood in your stool, nausea or vomiting, a fever, or increased abdominal pain. This information is not intended to replace advice given to you by your health care provider. Make sure you discuss any questions you have with your health care provider. Document Released: 09/30/2003 Document Revised: 12/08/2016 Document Reviewed: 04/29/2015 Elsevier Interactive Patient Education  2019 [place] Anesthesia is a term that refers to techniques, procedures, and medicines that help a person stay safe and comfortable during a medical procedure. Monitored anesthesia care, or sedation, is one type of anesthesia. Your anesthesia specialist may recommend sedation if you will be having a procedure that does not require you to be unconscious, such as:  Cataract surgery.  A dental procedure.  A biopsy.  A colonoscopy. During the procedure, you may receive a medicine to help you relax (sedative). There are three levels of sedation:  Mild sedation. At this level, you may feel awake and relaxed. You will be able to follow directions.  Moderate sedation. At this level, you will be sleepy. You may not remember the procedure.  Deep sedation. At this level, you will be asleep. You will not remember the procedure. The more medicine you are given, the deeper your level of sedation will be. Depending on how you respond to the procedure, the anesthesia specialist may change your level of sedation or the type of anesthesia to fit your needs. An anesthesia specialist will monitor you closely during the procedure. Let your health care provider know about:  Any allergies you have.  All medicines you are  taking, including vitamins, herbs, eye drops, creams, and over-the-counter medicines.  Any use of steroids (by mouth or as a cream).  Any problems you or family members have had with sedatives and anesthetic medicines.  Any blood disorders  you have.  Any surgeries you have had.  Any medical conditions you have, such as sleep apnea.  Whether you are pregnant or may be pregnant.  Any use of cigarettes, alcohol, or street drugs. What are the risks? Generally, this is a safe procedure. However, problems may occur, including:  Getting too much medicine (oversedation).  Nausea.  Allergic reaction to medicines.  Trouble breathing. If this happens, a breathing tube may be used to help with breathing. It will be removed when you are awake and breathing on your own.  Heart trouble.  Lung trouble. Before the procedure Ask your health care provider about:  Changing or stopping your regular medicines. This is especially important if you are taking diabetes medicines or blood thinners.  Taking medicines such as aspirin and ibuprofen. These medicines can thin your blood. Do not take these medicines before your procedure if your health care provider instructs you not to. Tests and exams  You will have a physical exam.  You may have blood tests done to show: ? How well your kidneys and liver are working. ? How well your blood can clot. General instructions  Plan to have someone take you home from the hospital or clinic.  If you will be going home right after the procedure, plan to have someone with you for 24 hours.  What happens during the procedure?  Your blood pressure, heart rate, breathing, level of pain and overall condition will be monitored.  An IV tube will be inserted into one of your veins.  Your anesthesia specialist will give you medicines as needed to keep you comfortable during the procedure. This may mean changing the level of sedation.  The procedure will be  performed. After the procedure  Your blood pressure, heart rate, breathing rate, and blood oxygen level will be monitored until the medicines you were given have worn off.  Do not drive for 24 hours if you received a sedative.  You may: ? Feel sleepy, clumsy, or nauseous. ? Feel forgetful about what happened after the procedure. ? Have a sore throat if you had a breathing tube during the procedure. ? Vomit. This information is not intended to replace advice given to you by your health care provider. Make sure you discuss any questions you have with your health care provider. Document Released: 11/11/2004 Document Revised: 07/25/2015 Document Reviewed: 06/08/2015 Elsevier Interactive Patient Education  2019 [place], Care After These instructions provide you with information about caring for yourself after your procedure. Your health care provider may also give you more specific instructions. Your treatment has been planned according to current medical practices, but problems sometimes occur. Call your health care provider if you have any problems or questions after your procedure. What can I expect after the procedure? After your procedure, you may:  Feel sleepy for several hours.  Feel clumsy and have poor balance for several hours.  Feel forgetful about what happened after the procedure.  Have poor judgment for several hours.  Feel nauseous or vomit.  Have a sore throat if you had a breathing tube during the procedure. Follow these instructions at home: For at least 24 hours after the procedure:      Have a responsible adult stay with you. It is important to have someone help care for you until you are awake and alert.  Rest as needed.  Do not: ? Participate in activities in which you could fall or become injured. ? Drive. ? Use heavy  machinery. ? Drink alcohol. ? Take sleeping pills or medicines that cause drowsiness. ? Make important  decisions or sign legal documents. ? Take care of children on your own. Eating and drinking  Follow the diet that is recommended by your health care provider.  If you vomit, drink water, juice, or soup when you can drink without vomiting.  Make sure you have little or no nausea before eating solid foods. General instructions  Take over-the-counter and prescription medicines only as told by your health care provider.  If you have sleep apnea, surgery and certain medicines can increase your risk for breathing problems. Follow instructions from your health care provider about wearing your sleep device: ? Anytime you are sleeping, including during daytime naps. ? While taking prescription pain medicines, sleeping medicines, or medicines that make you drowsy.  If you smoke, do not smoke without supervision.  Keep all follow-up visits as told by your health care provider. This is important. Contact a health care provider if:  You keep feeling nauseous or you keep vomiting.  You feel light-headed.  You develop a rash.  You have a fever. Get help right away if:  You have trouble breathing. Summary  For several hours after your procedure, you may feel sleepy and have poor judgment.  Have a responsible adult stay with you for at least 24 hours or until you are awake and alert. This information is not intended to replace advice given to you by your health care provider. Make sure you discuss any questions you have with your health care provider. Document Released: 06/08/2015 Document Revised: 10/01/2016 Document Reviewed: 06/08/2015 Elsevier Interactive Patient Education  2019 [employer].

## 2018-04-21 ENCOUNTER — Encounter (HOSPITAL_COMMUNITY): Payer: Self-pay

## 2018-04-21 ENCOUNTER — Encounter (HOSPITAL_COMMUNITY)
Admission: RE | Admit: 2018-04-21 | Discharge: 2018-04-21 | Disposition: A | Discharge status: Home / Self Care | Payer: Medicare Other | Source: Ambulatory Visit | Attending: Internal Medicine | Admitting: Internal Medicine

## 2018-04-21 ENCOUNTER — Other Ambulatory Visit: Payer: Self-pay

## 2018-04-21 DIAGNOSIS — Z01812 Encounter for preprocedural laboratory examination: Secondary | ICD-10-CM | POA: Diagnosis not present

## 2018-04-21 DIAGNOSIS — Z860101 Personal history of adenomatous and serrated colon polyps: Secondary | ICD-10-CM

## 2018-04-21 DIAGNOSIS — Z86010 Personal history of colonic polyps: Secondary | ICD-10-CM | POA: Diagnosis not present

## 2018-04-21 LAB — CBC WITH DIFFERENTIAL/PLATELET
Abs Immature Granulocytes: 0.01 10*3/uL (ref 0.00–0.07)
Basophils Absolute: 0 10*3/uL (ref 0.0–0.1)
Basophils Relative: 1 %
EOS ABS: 0.1 10*3/uL (ref 0.0–0.5)
EOS PCT: 2 %
HCT: 42.9 % (ref 36.0–46.0)
Hemoglobin: 13.3 g/dL (ref 12.0–15.0)
Immature Granulocytes: 0 %
Lymphocytes Relative: 25 %
Lymphs Abs: 1.5 10*3/uL (ref 0.7–4.0)
MCH: 26.6 pg (ref 26.0–34.0)
MCHC: 31 g/dL (ref 30.0–36.0)
MCV: 85.8 fL (ref 80.0–100.0)
MONO ABS: 0.4 10*3/uL (ref 0.1–1.0)
Monocytes Relative: 7 %
Neutro Abs: 4.1 10*3/uL (ref 1.7–7.7)
Neutrophils Relative %: 65 %
Platelets: 169 10*3/uL (ref 150–400)
RBC: 5 MIL/uL (ref 3.87–5.11)
RDW: 14.3 % (ref 11.5–15.5)
WBC: 6.3 10*3/uL (ref 4.0–10.5)
nRBC: 0 % (ref 0.0–0.2)

## 2018-04-21 LAB — COMPREHENSIVE METABOLIC PANEL
ALT: 17 U/L (ref 0–44)
AST: 20 U/L (ref 15–41)
Albumin: 4 g/dL (ref 3.5–5.0)
Alkaline Phosphatase: 80 U/L (ref 38–126)
Anion gap: 7 (ref 5–15)
BUN: 9 mg/dL (ref 6–20)
CO2: 26 mmol/L (ref 22–32)
Calcium: 8.9 mg/dL (ref 8.9–10.3)
Chloride: 105 mmol/L (ref 98–111)
Creatinine, Ser: 0.82 mg/dL (ref 0.44–1.00)
GFR calc Af Amer: >60 mL/min (ref >60)
Glucose, Bld: 55 mg/dL — ABNORMAL LOW (ref 70–99)
Potassium: 3.9 mmol/L (ref 3.5–5.1)
Sodium: 138 mmol/L (ref 135–145)
Total Bilirubin: 0.7 mg/dL (ref 0.3–1.2)
Total Protein: 7.3 g/dL (ref 6.5–8.1)

## 2018-04-22 ENCOUNTER — Other Ambulatory Visit: Payer: Self-pay | Admitting: Family

## 2018-04-28 ENCOUNTER — Ambulatory Visit (HOSPITAL_COMMUNITY)
Admission: RE | Admit: 2018-04-28 | Discharge: 2018-04-28 | Disposition: A | Discharge status: Home / Self Care | Payer: Medicare Other | Attending: Internal Medicine | Admitting: Internal Medicine

## 2018-04-28 ENCOUNTER — Ambulatory Visit (HOSPITAL_COMMUNITY): Payer: Medicare Other | Admitting: Anesthesiology

## 2018-04-28 ENCOUNTER — Encounter (HOSPITAL_COMMUNITY)
Admission: RE | Disposition: A | Discharge status: Home / Self Care | Payer: Self-pay | Source: Home / Self Care | Attending: Internal Medicine

## 2018-04-28 ENCOUNTER — Encounter (HOSPITAL_COMMUNITY): Payer: Self-pay

## 2018-04-28 DIAGNOSIS — Z886 Allergy status to analgesic agent status: Secondary | ICD-10-CM | POA: Insufficient documentation

## 2018-04-28 DIAGNOSIS — M549 Dorsalgia, unspecified: Secondary | ICD-10-CM | POA: Diagnosis not present

## 2018-04-28 DIAGNOSIS — G8929 Other chronic pain: Secondary | ICD-10-CM | POA: Diagnosis not present

## 2018-04-28 DIAGNOSIS — I10 Essential (primary) hypertension: Secondary | ICD-10-CM | POA: Diagnosis not present

## 2018-04-28 DIAGNOSIS — Z7951 Long term (current) use of inhaled steroids: Secondary | ICD-10-CM | POA: Insufficient documentation

## 2018-04-28 DIAGNOSIS — Z86010 Personal history of colonic polyps: Secondary | ICD-10-CM | POA: Diagnosis not present

## 2018-04-28 DIAGNOSIS — Z79899 Other long term (current) drug therapy: Secondary | ICD-10-CM | POA: Diagnosis not present

## 2018-04-28 DIAGNOSIS — K621 Rectal polyp: Secondary | ICD-10-CM | POA: Diagnosis not present

## 2018-04-28 DIAGNOSIS — K6289 Other specified diseases of anus and rectum: Secondary | ICD-10-CM | POA: Diagnosis not present

## 2018-04-28 DIAGNOSIS — D123 Benign neoplasm of transverse colon: Secondary | ICD-10-CM | POA: Insufficient documentation

## 2018-04-28 DIAGNOSIS — Z87891 Personal history of nicotine dependence: Secondary | ICD-10-CM | POA: Insufficient documentation

## 2018-04-28 DIAGNOSIS — K219 Gastro-esophageal reflux disease without esophagitis: Secondary | ICD-10-CM | POA: Diagnosis not present

## 2018-04-28 DIAGNOSIS — K449 Diaphragmatic hernia without obstruction or gangrene: Secondary | ICD-10-CM | POA: Diagnosis not present

## 2018-04-28 DIAGNOSIS — F329 Major depressive disorder, single episode, unspecified: Secondary | ICD-10-CM | POA: Insufficient documentation

## 2018-04-28 DIAGNOSIS — F418 Other specified anxiety disorders: Secondary | ICD-10-CM | POA: Diagnosis not present

## 2018-04-28 DIAGNOSIS — Z860101 Personal history of adenomatous and serrated colon polyps: Secondary | ICD-10-CM | POA: Insufficient documentation

## 2018-04-28 DIAGNOSIS — Z09 Encounter for follow-up examination after completed treatment for conditions other than malignant neoplasm: Secondary | ICD-10-CM | POA: Diagnosis not present

## 2018-04-28 DIAGNOSIS — Z8249 Family history of ischemic heart disease and other diseases of the circulatory system: Secondary | ICD-10-CM | POA: Diagnosis not present

## 2018-04-28 DIAGNOSIS — K5730 Diverticulosis of large intestine without perforation or abscess without bleeding: Secondary | ICD-10-CM | POA: Diagnosis not present

## 2018-04-28 DIAGNOSIS — Z881 Allergy status to other antibiotic agents status: Secondary | ICD-10-CM | POA: Insufficient documentation

## 2018-04-28 DIAGNOSIS — K581 Irritable bowel syndrome with constipation: Secondary | ICD-10-CM | POA: Insufficient documentation

## 2018-04-28 DIAGNOSIS — M542 Cervicalgia: Secondary | ICD-10-CM | POA: Insufficient documentation

## 2018-04-28 DIAGNOSIS — Z1211 Encounter for screening for malignant neoplasm of colon: Secondary | ICD-10-CM | POA: Diagnosis not present

## 2018-04-28 DIAGNOSIS — K644 Residual hemorrhoidal skin tags: Secondary | ICD-10-CM | POA: Diagnosis not present

## 2018-04-28 DIAGNOSIS — Z791 Long term (current) use of non-steroidal anti-inflammatories (NSAID): Secondary | ICD-10-CM | POA: Insufficient documentation

## 2018-04-28 DIAGNOSIS — G4700 Insomnia, unspecified: Secondary | ICD-10-CM | POA: Insufficient documentation

## 2018-04-28 DIAGNOSIS — J45909 Unspecified asthma, uncomplicated: Secondary | ICD-10-CM | POA: Diagnosis not present

## 2018-04-28 DIAGNOSIS — Z888 Allergy status to other drugs, medicaments and biological substances status: Secondary | ICD-10-CM | POA: Insufficient documentation

## 2018-04-28 HISTORY — PX: POLYPECTOMY: SHX5525

## 2018-04-28 HISTORY — PX: COLONOSCOPY WITH PROPOFOL: SHX5780

## 2018-04-28 LAB — GLUCOSE, CAPILLARY
Glucose-Capillary: 74 mg/dL (ref 70–99)
Glucose-Capillary: 69 mg/dL — ABNORMAL LOW (ref 70–99)
Glucose-Capillary: 71 mg/dL (ref 70–99)

## 2018-04-28 SURGERY — COLONOSCOPY WITH PROPOFOL
Anesthesia: Monitor Anesthesia Care

## 2018-04-28 MED ORDER — LACTATED RINGERS IV SOLN
INTRAVENOUS | Status: DC | PRN
  Administered 2018-04-28: 07:00:00 via INTRAVENOUS

## 2018-04-28 MED ORDER — PROPOFOL 500 MG/50ML IV EMUL
INTRAVENOUS | Status: DC | PRN
  Administered 2018-04-28: 150 ug/kg/min via INTRAVENOUS

## 2018-04-28 MED ORDER — PROPOFOL 10 MG/ML IV BOLUS
INTRAVENOUS | Status: AC
  Filled 2018-04-28: qty 40

## 2018-04-28 MED ORDER — MIDAZOLAM HCL 2 MG/2ML IJ SOLN
INTRAMUSCULAR | Status: AC
  Filled 2018-04-28: qty 2

## 2018-04-28 MED ORDER — MIDAZOLAM HCL 5 MG/5ML IJ SOLN
INTRAMUSCULAR | Status: DC | PRN
  Administered 2018-04-28 (×2): 1 mg via INTRAVENOUS

## 2018-04-28 NOTE — H&P (Signed)
Teresa [name] is an 63 y.o. female.   Chief Complaint: Patient is here for colonoscopy. HPI: Patient is 63 year old Caucasian female with multiple medical problems who also has history of colonic adenomas and is here for surveillance colonoscopy.  She has chronic constipation which she tries to control with stool softener and fiber pills.  She uses Linzess and as-needed basis.  She denies abdominal pain or rectal bleeding.  Last colonoscopy was in July 2015 by Dr. Scarlette Shorts with removal of 7 tubular adenomas.  Family history is negative for CRC.  Patient does not take aspirin or NSAIDs.  She has chronic neck and back pain as well as osteoarthritis.  She is on chronic pain medication. Preprocedure labs revealed glucose of 55.  Glucose this morning is 75.  She has no symptoms of hypoglycemia such as tremors hunger or diaphoresis.  Past Medical History:  Diagnosis Date  . Anxiety state, unspecified   . Asthma    prn inhaler  . Chronic back pain greater than 3 months duration   . Colon polyp   . Degeneration of intervertebral disc, site unspecified   . Depressive disorder, not elsewhere classified   . Diverticulitis   . Diverticulosis 07-08-2005   colonoscopy  . Esophageal stricture 2014  . Full dentures   . GERD (gastroesophageal reflux disease) 07-08-2005   EGD  . Hepatitis C   . Hiatal hernia 07-08-2005   EGD  . Hypertension   . Insomnia   . Irritable bowel syndrome   . Migraine   . Muscle spasm   . Osteoarthrosis, unspecified whether generalized or localized, lower leg   . Papilloma of breast 09/2011   left  . Seasonal allergies   . Seizures ([place]) 1990s   x 1, unknown cause; no seizures since; no meds, unknown etiology  . Vocal cord polyp    history of    Past Surgical History:  Procedure Laterality Date  . ABDOMINAL HYSTERECTOMY     partial  . BILATERAL SALPINGOOPHORECTOMY    . BLADDER SURGERY     bladder tack  . BREAST BIOPSY  11/03/2011   Procedure: BREAST BIOPSY WITH  NEEDLE LOCALIZATION;  Surgeon: Harl Bowie, MD;  Location: [facility];  Service: General;  Laterality: Left;  needle localized left breast biopsy  . CHOLECYSTECTOMY    . COLONOSCOPY  07/08/2005   562.10  . CYSTOSCOPY  08/24/2011   Procedure: CYSTOSCOPY;  Surgeon: Reece Packer, MD;  Location: [facility];  Service: Urology;  Laterality: N/A;  Cystoscopy,  Rectocele Repair and Vault Prolapse Repair  . ESOPHAGOGASTRODUODENOSCOPY  10/03/2012   multiple   . OTHER SURGICAL HISTORY     exc. vocal cord polyp  . RECTAL TUMOR BY PROCTOTOMY EXCISION    . RECTOCELE REPAIR  08/24/2011   Procedure: POSTERIOR REPAIR (RECTOCELE);  Surgeon: Reece Packer, MD;  Location: [facility];  Service: Urology;  Laterality: N/A;  . rectocelle repair    . TUMOR REMOVAL     benign  . VAGINAL PROLAPSE REPAIR  08/24/2011   Procedure: VAGINAL VAULT SUSPENSION;  Surgeon: Reece Packer, MD;  Location: [facility];  Service: Urology;  Laterality: N/A;    Family History  Problem Relation Age of Onset  . Prostate cancer Father   . Sleep apnea Father   . Stomach cancer Maternal Uncle   . Diabetes Maternal Grandmother   . Stroke Maternal Grandmother   . Heart disease Paternal Grandmother   . Irritable bowel syndrome Other   .  Stroke Maternal Grandfather    Social History:  reports that she quit smoking about 26 years ago. Her smoking use included cigarettes. She has a 0.75 pack-year smoking history. She has never used smokeless tobacco. She reports current drug use. She reports that she does not drink alcohol.  Allergies:  Allergies  Allergen Reactions  . Lyrica [Pregabalin] Shortness Of Breath  . Trazodone And Nefazodone Shortness Of Breath  . Amitriptyline   . Arthrotec [Diclofenac-Misoprostol] Other (See Comments)    Upset stomach  . Keflex [Cephalexin] Other (See Comments)    Unknown reaction  . Naproxen Other (See Comments)    Irritates stomach  . Prozac [Fluoxetine Hcl] Other (See  Comments)    "makes me feel bad"  . Remeron [Mirtazapine] Other (See Comments)    "off balance"  . Zolpidem Other (See Comments)    Other  . Ambien [Zolpidem Tartrate] Other (See Comments)    COULDN'T FUNCTION, STAYED SLEEPY    Medications Prior to Admission  Medication Sig Dispense Refill  . albuterol (PROVENTIL) (2.5 MG/3ML) 0.083% nebulizer solution NEBULIZE 1 VIAL EVERY 6 HOURS AS NEEDED FOR SHORTNESS OF BREATH 150 mL 1  . buprenorphine (BUTRANS - DOSED MCG/HR) 10 MCG/HR PTWK patch Place 10 mcg onto the skin once a week.     Marland Kitchen buPROPion (WELLBUTRIN XL) 300 MG 24 hr tablet Take 1 tablet (300 mg total) by mouth daily. TAKE ONE (1) TABLET EACH DAY 90 tablet 1  . cetirizine (ZYRTEC) 10 MG tablet TAKE ONE TABLET EVERY MORNING 30 tablet 5  . citalopram (CELEXA) 20 MG tablet Take 1 tablet (20 mg total) by mouth daily. 30 tablet 5  . cyclobenzaprine (FLEXERIL) 10 MG tablet Take 10 mg by mouth 3 (three) times daily as needed for muscle spasms.     Marland Kitchen dexlansoprazole (DEXILANT) 60 MG capsule TAKE ONE (1) CAPSULE EACH DAY 90 capsule 1  . diazepam (VALIUM) 10 MG tablet Take 10 mg by mouth every 12 (twelve) hours as needed for anxiety.     . diclofenac sodium (VOLTAREN) 1 % GEL Apply 1 application topically 2 (two) times daily as needed (pain).     Marland Kitchen docusate sodium (COLACE) 100 MG capsule Take 100-200 mg by mouth daily as needed for mild constipation.     Marland Kitchen linaclotide (LINZESS) 72 MCG capsule Take 1 capsule (72 mcg total) by mouth daily before breakfast. 30 capsule 2  . lindane lotion (KWELL) 1 % APPLY ONCE 60 mL 1  . lisinopril (PRINIVIL,ZESTRIL) 10 MG tablet Take 10 mg by mouth daily.    . mometasone (NASONEX) 50 MCG/ACT nasal spray USE 2 SPRAYS IN EACH NOSTRIL ONCE DAILY 51 g 2  . montelukast (SINGULAIR) 10 MG tablet TAKE ONE (1) TABLET EACH DAY 90 tablet 0  . Olopatadine HCl 0.2 % SOLN Place 1 drop into both eyes daily as needed for irritation.    . ondansetron (ZOFRAN) 4 MG tablet Take 4 mg  by mouth every 8 (eight) hours as needed for nausea or vomiting.     Marland Kitchen oxymorphone (OPANA) 5 MG tablet Take 5 mg by mouth 2 (two) times daily.     . permethrin (ELIMITE) 5 % cream APPLY TOPICALLY ONCE FOR A SINGLE DOSE 60 g 0  . PROAIR HFA 108 (90 Base) MCG/ACT inhaler USE 1 TO 2 PUFFS EVERY 4 TO 6 HOURS AS NEEDED (Patient taking differently: Inhale 1-2 puffs into the lungs every 4 (four) hours as needed for wheezing or shortness of breath. ) 25.5  g 4  . SYMBICORT 160-4.5 MCG/ACT inhaler USE 2 PUFFS TWICE DAILY 10.2 g 5  . triamcinolone ointment (KENALOG) 0.5 % Apply 1 application topically 2 (two) times daily. 30 g 0  . ZTLIDO 1.8 % PTCH Place 1 patch onto the skin 2 (two) times daily.       No results found for this or any previous visit (from the past 48 hour(s)). No results found.  ROS  Blood pressure 104/66, pulse 83, temperature 97.7 F (36.5 C), temperature source Oral, resp. rate 16, SpO2 98 %. Physical Exam  Constitutional: She appears well-developed and well-nourished.  HENT:  Mouth/Throat: Oropharynx is clear and moist.  Eyes: Conjunctivae are normal. No scleral icterus.  Neck: No thyromegaly present.  Cardiovascular: Normal rate, regular rhythm and normal heart sounds.  No murmur heard. Respiratory: Effort normal and breath sounds normal.  GI: Soft. She exhibits no distension and no mass. There is no abdominal tenderness.  Musculoskeletal:        General: No edema.  Lymphadenopathy:    She has no cervical adenopathy.  Neurological: She is alert.  Skin: Skin is warm and dry.     Assessment/Plan History of colonic adenomas. Surveillance colonoscopy.  Hildred Laser, MD 04/28/2018, 7:20 AM

## 2018-04-28 NOTE — Anesthesia Postprocedure Evaluation (Signed)
Anesthesia Post Note  Patient: Teresa [name]  Procedure(s) Performed: COLONOSCOPY WITH PROPOFOL (N/A ) POLYPECTOMY  Patient location during evaluation: PACU Anesthesia Type: MAC Level of consciousness: awake and patient cooperative Pain management: pain level controlled Vital Signs Assessment: post-procedure vital signs reviewed and stable Respiratory status: spontaneous breathing, nonlabored ventilation and respiratory function stable Cardiovascular status: blood pressure returned to baseline Postop Assessment: no apparent nausea or vomiting Anesthetic complications: no     Last Vitals:  Vitals:   04/28/18 0635  BP: 104/66  Pulse: 83  Resp: 16  Temp: 36.5 C  SpO2: 98%    Last Pain:  Vitals:   04/28/18 0730  TempSrc:   PainSc: 4                  Rashawn Rolon J

## 2018-04-28 NOTE — Transfer of Care (Signed)
Immediate Anesthesia Transfer of Care Note  Patient: Teresa [name]  Procedure(s) Performed: COLONOSCOPY WITH PROPOFOL (N/A ) POLYPECTOMY  Patient Location: PACU  Anesthesia Type:MAC  Level of Consciousness: awake and patient cooperative  Airway & Oxygen Therapy: Patient Spontanous Breathing and Patient connected to nasal cannula oxygen  Post-op Assessment: Report given to RN and Post -op Vital signs reviewed and stable  Post vital signs: Reviewed and stable  Last Vitals:  Vitals Value Taken Time  BP    Temp    Pulse    Resp    SpO2      Last Pain:  Vitals:   04/28/18 0730  TempSrc:   PainSc: 4       Patients Stated Pain Goal: 5 (32/95/18 8416)  Complications: No apparent anesthesia complications

## 2018-04-28 NOTE — Discharge Instructions (Signed)
No aspirin or NSAIDs for 24 hours. Resume usual medications as before. High-fiber diet. No driving for 24 hours. Physician will call with biopsy results.  Colonoscopy, Adult, Care After This sheet gives you information about how to care for yourself after your procedure. Your doctor may also give you more specific instructions. If you have problems or questions, call your doctor. What can I expect after the procedure? After the procedure, it is common to have:  A small amount of blood in your poop for 24 hours.  Some gas.  Mild cramping or bloating in your belly. Follow these instructions at home: General instructions  For the first 24 hours after the procedure: ? Do not drive or use machinery. ? Do not sign important documents. ? Do not drink alcohol. ? Do your daily activities more slowly than normal. ? Eat foods that are soft and easy to digest.  Take over-the-counter or prescription medicines only as told by your doctor. To help cramping and bloating:   Try walking around.  Put heat on your belly (abdomen) as told by your doctor. Use a heat source that your doctor recommends, such as a moist heat pack or a heating pad. ? Put a towel between your skin and the heat source. ? Leave the heat on for 20-30 minutes. ? Remove the heat if your skin turns bright red. This is especially important if you cannot feel pain, heat, or cold. You can get burned. Eating and drinking   Drink enough fluid to keep your pee (urine) clear or pale yellow.  Return to your normal diet as told by your doctor. Avoid heavy or fried foods that are hard to digest.  Avoid drinking alcohol for as long as told by your doctor. Contact a doctor if:  You have blood in your poop (stool) 2-3 days after the procedure. Get help right away if:  You have more than a small amount of blood in your poop.  You see large clumps of tissue (blood clots) in your poop.  Your belly is swollen.  You feel sick to  your stomach (nauseous).  You throw up (vomit).  You have a fever.  You have belly pain that gets worse, and medicine does not help your pain. Summary  After the procedure, it is common to have a small amount of blood in your poop. You may also have mild cramping and bloating in your belly.  For the first 24 hours after the procedure, do not drive or use machinery, do not sign important documents, and do not drink alcohol.  Get help right away if you have a lot of blood in your poop, feel sick to your stomach, have a fever, or have more belly pain. This information is not intended to replace advice given to you by your health care provider. Make sure you discuss any questions you have with your health care provider. Document Released: 03/20/2010 Document Revised: 12/16/2016 Document Reviewed: 11/10/2015 Elsevier Interactive Patient Education  2019 [employer]  High-Fiber Diet Fiber, also called dietary fiber, is a type of carbohydrate that is found in fruits, vegetables, whole grains, and beans. A high-fiber diet can have many health benefits. Your health care provider may recommend a high-fiber diet to help:  Prevent constipation. Fiber can make your bowel movements more regular.  Lower your cholesterol.  Relieve the following conditions: ? Swelling of veins in the anus (hemorrhoids). ? Swelling and irritation (inflammation) of specific areas of the digestive tract (uncomplicated diverticulosis). ? A  problem of the large intestine (colon) that sometimes causes pain and diarrhea (irritable bowel syndrome, IBS).  Prevent overeating as part of a weight-loss plan.  Prevent heart disease, type 2 diabetes, and certain cancers. What is my plan? The recommended daily fiber intake in grams (g) includes:  38 g for men age 38 or younger.  30 g for men over age 53.  53 g for women age 62 or younger.  21 g for women over age 85. You can get the recommended daily intake of dietary  fiber by:  Eating a variety of fruits, vegetables, grains, and beans.  Taking a fiber supplement, if it is not possible to get enough fiber through your diet. What do I need to know about a high-fiber diet?  It is better to get fiber through food sources rather than from fiber supplements. There is not a lot of research about how effective supplements are.  Always check the fiber content on the nutrition facts label of any prepackaged food. Look for foods that contain 5 g of fiber or more per serving.  Talk with a diet and nutrition specialist (dietitian) if you have questions about specific foods that are recommended or not recommended for your medical condition, especially if those foods are not listed below.  Gradually increase how much fiber you consume. If you increase your intake of dietary fiber too quickly, you may have bloating, cramping, or gas.  Drink plenty of water. Water helps you to digest fiber. What are tips for following this plan?  Eat a wide variety of high-fiber foods.  Make sure that half of the grains that you eat each day are whole grains.  Eat breads and cereals that are made with whole-grain flour instead of refined flour or white flour.  Eat brown rice, bulgur wheat, or millet instead of white rice.  Start the day with a breakfast that is high in fiber, such as a cereal that contains 5 g of fiber or more per serving.  Use beans in place of meat in soups, salads, and pasta dishes.  Eat high-fiber snacks, such as berries, raw vegetables, nuts, and popcorn.  Choose whole fruits and vegetables instead of processed forms like juice or sauce. What foods can I eat?  Fruits Berries. Pears. Apples. Oranges. Avocado. Prunes and raisins. Dried figs. Vegetables Sweet potatoes. Spinach. Kale. Artichokes. Cabbage. Broccoli. Cauliflower. Green peas. Carrots. Squash. Grains Whole-grain breads. Multigrain cereal. Oats and oatmeal. Brown rice. Barley. Bulgur wheat.  [place]. Quinoa. Bran muffins. Popcorn. Rye wafer crackers. Meats and other proteins Navy, kidney, and pinto beans. Soybeans. Split peas. Lentils. Nuts and seeds. Dairy Fiber-fortified yogurt. Beverages Fiber-fortified soy milk. Fiber-fortified orange juice. Other foods Fiber bars. The items listed above may not be a complete list of recommended foods and beverages. Contact a dietitian for more options. What foods are not recommended? Fruits Fruit juice. Cooked, strained fruit. Vegetables Fried potatoes. Canned vegetables. Well-cooked vegetables. Grains White bread. Pasta made with refined flour. White rice. Meats and other proteins Fatty cuts of meat. Fried chicken or fried fish. Dairy Milk. Yogurt. Cream cheese. Sour cream. Fats and oils Butters. Beverages Soft drinks. Other foods Cakes and pastries. The items listed above may not be a complete list of foods and beverages to avoid. Contact a dietitian for more information. Summary  Fiber is a type of carbohydrate. It is found in fruits, vegetables, whole grains, and beans.  There are many health benefits of eating a high-fiber diet, such as preventing constipation,  lowering blood cholesterol, helping with weight loss, and reducing your risk of heart disease, diabetes, and certain cancers.  Gradually increase your intake of fiber. Increasing too fast can result in cramping, bloating, and gas. Drink plenty of water while you increase your fiber.  The best sources of fiber include whole fruits and vegetables, whole grains, nuts, seeds, and beans. This information is not intended to replace advice given to you by your health care provider. Make sure you discuss any questions you have with your health care provider. Document Released: 02/15/2005 Document Revised: 12/20/2016 Document Reviewed: 12/20/2016 Elsevier Interactive Patient Education  2019 [employer].

## 2018-04-28 NOTE — Op Note (Signed)
[facility] Patient Name: Teresa [name] Procedure Date: 04/28/2018 7:07 AM MRN: [id number] Date of Birth: 02-Apr-1955 Attending MD: Hildred Laser , MD CSN: [id number] Age: 63 Admit Type: Outpatient Procedure:                Colonoscopy Indications:              High risk colon cancer surveillance: Personal                            history of colonic polyps Providers:                Hildred Laser, MD, Otis Peak B. Sharon Seller, RN, Aram Candela Referring MD:             Fransisca Kaufmann. Dettinger, MD Medicines:                Propofol per Anesthesia Complications:            No immediate complications. Estimated Blood Loss:     Estimated blood loss was minimal. Procedure:                Pre-Anesthesia Assessment:                           - Prior to the procedure, a History and Physical                            was performed, and patient medications and                            allergies were reviewed. The patient's tolerance of                            previous anesthesia was also reviewed. The risks                            and benefits of the procedure and the sedation                            options and risks were discussed with the patient.                            All questions were answered, and informed consent                            was obtained. Prior Anticoagulants: The patient has                            taken no previous anticoagulant or antiplatelet                            agents. ASA Grade Assessment: III - A patient with  severe systemic disease. After reviewing the risks                            and benefits, the patient was deemed in                            satisfactory condition to undergo the procedure.                           After obtaining informed consent, the colonoscope                            was passed under direct vision. Throughout the                            procedure, the  patient's blood pressure, pulse, and                            oxygen saturations were monitored continuously. The                            [id number]-H190DL ([id number]) scope was introduced through                            the anus and advanced to the the cecum, identified                            by appendiceal orifice and ileocecal valve. The                            colonoscopy was performed without difficulty. The                            patient tolerated the procedure well. The quality                            of the bowel preparation was good. The ileocecal                            valve, appendiceal orifice, and rectum were                            photographed. Scope In: 7:35:44 AM Scope Out: 7:53:04 AM Scope Withdrawal Time: 0 hours 12 minutes 48 seconds  Total Procedure Duration: 0 hours 17 minutes 20 seconds  Findings:      The perianal examination was normal.      The digital rectal exam findings include decreased sphincter tone.      Two sessile polyps were found in the rectum and proximal transverse       colon. The polyps were small in size. These polyps were removed with a       cold snare. Resection was complete, but the polyp tissue was only       partially retrieved.      Scattered medium-mouthed diverticula were found in the sigmoid  colon.      External hemorrhoids were found during retroflexion. The hemorrhoids       were small. Impression:               - Decreased sphincter tone found on digital rectal                            exam.                           - Two small polyps in the rectum and in the                            proximal transverse colon, removed with a cold                            snare. Complete resection. Partial retrieval.                           - Diverticulosis in the sigmoid colon.                           - External hemorrhoids. Moderate Sedation:      Per Anesthesia Care Recommendation:           - Patient has a  contact number available for                            emergencies. The signs and symptoms of potential                            delayed complications were discussed with the                            patient. Return to normal activities tomorrow.                            Written discharge instructions were provided to the                            patient.                           - High fiber diet today.                           - Continue present medications.                           - No aspirin, ibuprofen, naproxen, or other                            non-steroidal anti-inflammatory drugs for 1 day.                           - Await pathology results.                           -  Repeat colonoscopy in 5 years for surveillance. Procedure Code(s):        --- Professional ---                           [phone], Colonoscopy, flexible; with removal of                            tumor(s), polyp(s), or other lesion(s) by snare                            technique Diagnosis Code(s):        --- Professional ---                           Z86.010, Personal history of colonic polyps                           K62.89, Other specified diseases of anus and rectum                           K62.1, Rectal polyp                           D12.3, Benign neoplasm of transverse colon (hepatic                            flexure or splenic flexure)                           K64.4, Residual hemorrhoidal skin tags                           K57.30, Diverticulosis of large intestine without                            perforation or abscess without bleeding CPT copyright 2018 American Medical Association. All rights reserved. The codes documented in this report are preliminary and upon coder review may  be revised to meet current compliance requirements. Hildred Laser, MD Hildred Laser, MD 04/28/2018 8:00:59 AM This report has been signed electronically. Number of Addenda: 0

## 2018-04-28 NOTE — Anesthesia Preprocedure Evaluation (Signed)
Anesthesia Evaluation  Patient identified by MRN, date of birth, ID band Patient awake    Reviewed: Allergy & Precautions, H&P , NPO status , Patient's Chart, lab work & pertinent test results  Airway Mallampati: I  TM Distance: >3 FB Neck ROM: full    Dental no notable dental hx. (+) Edentulous Lower, Edentulous Upper, Upper Dentures, Lower Dentures   Pulmonary asthma , former smoker,    Pulmonary exam normal breath sounds clear to auscultation       Cardiovascular Exercise Tolerance: Good hypertension, negative cardio ROS   Rhythm:regular Rate:Normal     Neuro/Psych  Headaches, Seizures -,  PSYCHIATRIC DISORDERS Anxiety Depression  Neuromuscular disease    GI/Hepatic hiatal hernia, GERD  ,(+) Hepatitis -, C  Endo/Other  negative endocrine ROS  Renal/GU negative Renal ROS  negative genitourinary   Musculoskeletal   Abdominal   Peds  Hematology negative hematology ROS (+)   Anesthesia Other Findings   Reproductive/Obstetrics negative OB ROS                             Anesthesia Physical Anesthesia Plan  ASA: III  Anesthesia Plan: MAC   Post-op Pain Management:    Induction:   PONV Risk Score and Plan:   Airway Management Planned:   Additional Equipment:   Intra-op Plan:   Post-operative Plan:   Informed Consent: I have reviewed the patients History and Physical, chart, labs and discussed the procedure including the risks, benefits and alternatives for the proposed anesthesia with the patient or authorized representative who has indicated his/her understanding and acceptance.     Dental Advisory Given  Plan Discussed with: CRNA  Anesthesia Plan Comments:         Anesthesia Quick Evaluation

## 2018-05-03 ENCOUNTER — Encounter (HOSPITAL_COMMUNITY): Payer: Self-pay | Admitting: Internal Medicine

## 2018-05-16 ENCOUNTER — Other Ambulatory Visit: Payer: Self-pay | Admitting: Family Medicine

## 2018-05-16 DIAGNOSIS — M545 Low back pain: Secondary | ICD-10-CM | POA: Diagnosis not present

## 2018-05-16 DIAGNOSIS — Z79891 Long term (current) use of opiate analgesic: Secondary | ICD-10-CM | POA: Diagnosis not present

## 2018-05-16 DIAGNOSIS — G5600 Carpal tunnel syndrome, unspecified upper limb: Secondary | ICD-10-CM | POA: Diagnosis not present

## 2018-05-16 DIAGNOSIS — M25569 Pain in unspecified knee: Secondary | ICD-10-CM | POA: Diagnosis not present

## 2018-05-22 ENCOUNTER — Other Ambulatory Visit: Payer: Self-pay | Admitting: Family Medicine

## 2018-06-26 ENCOUNTER — Other Ambulatory Visit: Payer: Self-pay | Admitting: Family Medicine

## 2018-07-10 DIAGNOSIS — M25569 Pain in unspecified knee: Secondary | ICD-10-CM | POA: Diagnosis not present

## 2018-07-10 DIAGNOSIS — Z79891 Long term (current) use of opiate analgesic: Secondary | ICD-10-CM | POA: Diagnosis not present

## 2018-07-10 DIAGNOSIS — G5600 Carpal tunnel syndrome, unspecified upper limb: Secondary | ICD-10-CM | POA: Diagnosis not present

## 2018-07-10 DIAGNOSIS — M545 Low back pain: Secondary | ICD-10-CM | POA: Diagnosis not present

## 2018-07-20 ENCOUNTER — Other Ambulatory Visit: Payer: Self-pay | Admitting: Family Medicine

## 2018-08-04 ENCOUNTER — Other Ambulatory Visit: Payer: Self-pay

## 2018-08-04 ENCOUNTER — Encounter: Payer: Medicare Other | Admitting: Family Medicine

## 2018-08-04 NOTE — Progress Notes (Signed)
Erroneous visit, unable to contact patient.

## 2018-08-04 NOTE — Progress Notes (Deleted)
Virtual Visit via telephone Note  I connected with Teresa [name] on 08/04/18 at *** by telephone and verified that I am speaking with the correct person using two identifiers. Teresa [name] is currently located at *** and {family members:[phone]} are currently with her during visit. The provider, Fransisca Kaufmann Dettinger, MD is located in their office at time of visit.  Call ended at ***  I discussed the limitations, risks, security and privacy concerns of performing an evaluation and management service by telephone and the availability of in person appointments. I also discussed with the patient that there may be a patient responsible charge related to this service. The patient expressed understanding and agreed to proceed.   History and Present Illness:   No diagnosis found.  Outpatient Encounter Medications as of 08/04/2018  Medication Sig  . albuterol (PROVENTIL) (2.5 MG/3ML) 0.083% nebulizer solution NEBULIZE 1 VIAL EVERY 6 HOURS AS NEEDED FOR SHORTNESS OF BREATH  . buprenorphine (BUTRANS - DOSED MCG/HR) 10 MCG/HR PTWK patch Place 10 mcg onto the skin once a week.   Marland Kitchen buPROPion (WELLBUTRIN XL) 300 MG 24 hr tablet Take 1 tablet (300 mg total) by mouth daily. TAKE ONE (1) TABLET EACH DAY  . cetirizine (ZYRTEC) 10 MG tablet TAKE ONE TABLET EVERY MORNING  . citalopram (CELEXA) 20 MG tablet Take 1 tablet (20 mg total) by mouth daily.  . cyclobenzaprine (FLEXERIL) 10 MG tablet Take 10 mg by mouth 3 (three) times daily as needed for muscle spasms.   Marland Kitchen dexlansoprazole (DEXILANT) 60 MG capsule TAKE ONE (1) CAPSULE EACH DAY  . diazepam (VALIUM) 10 MG tablet Take 10 mg by mouth every 12 (twelve) hours as needed for anxiety.   . diclofenac sodium (VOLTAREN) 1 % GEL Apply 1 application topically 2 (two) times daily as needed (pain).   Marland Kitchen docusate sodium (COLACE) 100 MG capsule Take 100-200 mg by mouth daily as needed for mild constipation.   Marland Kitchen linaclotide (LINZESS) 72 MCG capsule Take 1 capsule (72 mcg  total) by mouth daily before breakfast.  . lindane lotion (KWELL) 1 % APPLY ONCE  . lisinopril (PRINIVIL,ZESTRIL) 10 MG tablet Take 10 mg by mouth daily.  . mometasone (NASONEX) 50 MCG/ACT nasal spray USE 2 SPRAYS IN EACH NOSTRIL ONCE DAILY  . montelukast (SINGULAIR) 10 MG tablet TAKE ONE (1) TABLET EACH DAY  . Olopatadine HCl 0.2 % SOLN Place 1 drop into both eyes daily as needed for irritation.  . ondansetron (ZOFRAN) 4 MG tablet Take 4 mg by mouth every 8 (eight) hours as needed for nausea or vomiting.   Marland Kitchen oxymorphone (OPANA) 5 MG tablet Take 5 mg by mouth 2 (two) times daily.   . permethrin (ELIMITE) 5 % cream APPLY TOPICALLY ONCE FOR A SINGLE DOSE  . PROAIR HFA 108 (90 Base) MCG/ACT inhaler USE 1 TO 2 PUFFS EVERY 4 TO 6 HOURS AS NEEDED (Patient taking differently: Inhale 1-2 puffs into the lungs every 4 (four) hours as needed for wheezing or shortness of breath. )  . SYMBICORT 160-4.5 MCG/ACT inhaler USE 2 PUFFS TWICE DAILY  . triamcinolone ointment (KENALOG) 0.5 % Apply 1 application topically 2 (two) times daily.  Marland Kitchen ZTLIDO 1.8 % PTCH Place 1 patch onto the skin 2 (two) times daily.    No facility-administered encounter medications on file as of 08/04/2018.     Review of Systems  Observations/Objective:   Assessment and Plan: Problem List Items Addressed This Visit    None  Follow Up Instructions:     I discussed the assessment and treatment plan with the patient. The patient was provided an opportunity to ask questions and all were answered. The patient agreed with the plan and demonstrated an understanding of the instructions.   The patient was advised to call back or seek an in-person evaluation if the symptoms worsen or if the condition fails to improve as anticipated.  The above assessment and management plan was discussed with the patient. The patient verbalized understanding of and has agreed to the management plan. Patient is aware to call the clinic if symptoms  persist or worsen. Patient is aware when to return to the clinic for a follow-up visit. Patient educated on when it is appropriate to go to the emergency department.    I provided *** minutes of non-face-to-face time during this encounter.    Worthy Rancher, MD

## 2018-08-08 ENCOUNTER — Ambulatory Visit (INDEPENDENT_AMBULATORY_CARE_PROVIDER_SITE_OTHER): Payer: Medicare Other | Admitting: Family Medicine

## 2018-08-08 ENCOUNTER — Other Ambulatory Visit: Payer: Self-pay

## 2018-08-08 DIAGNOSIS — R1032 Left lower quadrant pain: Secondary | ICD-10-CM | POA: Diagnosis not present

## 2018-08-08 DIAGNOSIS — B86 Scabies: Secondary | ICD-10-CM | POA: Diagnosis not present

## 2018-08-08 MED ORDER — PERMETHRIN 5 % EX CREA: TOPICAL_CREAM | CUTANEOUS | 0 refills | Status: DC

## 2018-08-08 MED ORDER — AMOXICILLIN-POT CLAVULANATE 875-125 MG PO TABS: 1.0000 | ORAL_TABLET | Freq: Two times a day (BID) | ORAL | 0 refills | Status: DC

## 2018-08-08 NOTE — Progress Notes (Signed)
Telephone visit  Subjective: CC: diverticulitis PCP: Dettinger, Fransisca Kaufmann, MD ZOX:WRUEA Carlean Jews Teresa [name] is a 63 y.o. female calls for telephone consult today. Patient provides verbal consent for consult held via phone.  Location of patient: home Location of provider: Working remotely from home Others present for call: none  1.  Scabies Patient reports a 1 month history of itchy rash along bilateral hands and between her fingers.  She notes that the rash is also underneath the bra line.  She feels like this rash is similar to when she had scabies earlier this year.  She notes no fevers.  She did feel that her rash responded to permethrin earlier this year and is asking for refill of this  2.  Left lower quadrant abdominal pain Patient reports a 3-day history of left-sided lower quadrant abdominal pain that radiates to the left side of her pubic bone.  She denies any associated diarrhea, hematochezia or melena.  She has had nausea without vomiting.  Again no fevers.  Been having bowel movements daily which appeared normal in caliber and texture.  She is hydrating without difficulty.  She feels that this is consistent with her typical diverticulitis and is asking that we treat her with antibiotics.   ROS: Per HPI  Allergies  Allergen Reactions  . Lyrica [Pregabalin] Shortness Of Breath  . Trazodone And Nefazodone Shortness Of Breath  . Amitriptyline   . Arthrotec [Diclofenac-Misoprostol] Other (See Comments)    Upset stomach  . Keflex [Cephalexin] Other (See Comments)    Unknown reaction  . Naproxen Other (See Comments)    Irritates stomach  . Prozac [Fluoxetine Hcl] Other (See Comments)    "makes me feel bad"  . Remeron [Mirtazapine] Other (See Comments)    "off balance"  . Zolpidem Other (See Comments)    Other  . Ambien [Zolpidem Tartrate] Other (See Comments)    COULDN'T FUNCTION, STAYED SLEEPY   Past Medical History:  Diagnosis Date  . Anxiety state, unspecified   . Asthma    prn inhaler  . Chronic back pain greater than 3 months duration   . Colon polyp   . Degeneration of intervertebral disc, site unspecified   . Depressive disorder, not elsewhere classified   . Diverticulitis   . Diverticulosis 07-08-2005   colonoscopy  . Esophageal stricture 2014  . Full dentures   . GERD (gastroesophageal reflux disease) 07-08-2005   EGD  . Hepatitis C   . Hiatal hernia 07-08-2005   EGD  . Hypertension   . Insomnia   . Irritable bowel syndrome   . Migraine   . Muscle spasm   . Osteoarthrosis, unspecified whether generalized or localized, lower leg   . Papilloma of breast 09/2011   left  . Seasonal allergies   . Seizures ([place]) 1990s   x 1, unknown cause; no seizures since; no meds, unknown etiology  . Vocal cord polyp    history of    Current Outpatient Medications:  .  albuterol (PROVENTIL) (2.5 MG/3ML) 0.083% nebulizer solution, NEBULIZE 1 VIAL EVERY 6 HOURS AS NEEDED FOR SHORTNESS OF BREATH, Disp: 150 mL, Rfl: 1 .  buprenorphine (BUTRANS - DOSED MCG/HR) 10 MCG/HR PTWK patch, Place 10 mcg onto the skin once a week. , Disp: , Rfl:  .  buPROPion (WELLBUTRIN XL) 300 MG 24 hr tablet, Take 1 tablet (300 mg total) by mouth daily. TAKE ONE (1) TABLET EACH DAY, Disp: 90 tablet, Rfl: 1 .  cetirizine (ZYRTEC) 10 MG tablet, TAKE ONE TABLET  EVERY MORNING, Disp: 30 tablet, Rfl: 3 .  citalopram (CELEXA) 20 MG tablet, Take 1 tablet (20 mg total) by mouth daily., Disp: 30 tablet, Rfl: 5 .  cyclobenzaprine (FLEXERIL) 10 MG tablet, Take 10 mg by mouth 3 (three) times daily as needed for muscle spasms. , Disp: , Rfl:  .  dexlansoprazole (DEXILANT) 60 MG capsule, TAKE ONE (1) CAPSULE EACH DAY, Disp: 90 capsule, Rfl: 1 .  diazepam (VALIUM) 10 MG tablet, Take 10 mg by mouth every 12 (twelve) hours as needed for anxiety. , Disp: , Rfl:  .  diclofenac sodium (VOLTAREN) 1 % GEL, Apply 1 application topically 2 (two) times daily as needed (pain). , Disp: , Rfl:  .  docusate sodium  (COLACE) 100 MG capsule, Take 100-200 mg by mouth daily as needed for mild constipation. , Disp: , Rfl:  .  linaclotide (LINZESS) 72 MCG capsule, Take 1 capsule (72 mcg total) by mouth daily before breakfast., Disp: 30 capsule, Rfl: 2 .  lindane lotion (KWELL) 1 %, APPLY ONCE, Disp: 60 mL, Rfl: 1 .  lisinopril (PRINIVIL,ZESTRIL) 10 MG tablet, Take 10 mg by mouth daily., Disp: , Rfl:  .  mometasone (NASONEX) 50 MCG/ACT nasal spray, USE 2 SPRAYS IN EACH NOSTRIL ONCE DAILY, Disp: 51 g, Rfl: 2 .  montelukast (SINGULAIR) 10 MG tablet, TAKE ONE (1) TABLET EACH DAY, Disp: 90 tablet, Rfl: 0 .  Olopatadine HCl 0.2 % SOLN, Place 1 drop into both eyes daily as needed for irritation., Disp: , Rfl:  .  ondansetron (ZOFRAN) 4 MG tablet, Take 4 mg by mouth every 8 (eight) hours as needed for nausea or vomiting. , Disp: , Rfl:  .  oxymorphone (OPANA) 5 MG tablet, Take 5 mg by mouth 2 (two) times daily. , Disp: , Rfl:  .  permethrin (ELIMITE) 5 % cream, APPLY TOPICALLY ONCE FOR A SINGLE DOSE, Disp: 60 g, Rfl: 0 .  PROAIR HFA 108 (90 Base) MCG/ACT inhaler, USE 1 TO 2 PUFFS EVERY 4 TO 6 HOURS AS NEEDED (Patient taking differently: Inhale 1-2 puffs into the lungs every 4 (four) hours as needed for wheezing or shortness of breath. ), Disp: 25.5 g, Rfl: 4 .  SYMBICORT 160-4.5 MCG/ACT inhaler, USE 2 PUFFS TWICE DAILY, Disp: 10.2 g, Rfl: 5 .  triamcinolone ointment (KENALOG) 0.5 %, Apply 1 application topically 2 (two) times daily., Disp: 30 g, Rfl: 0 .  ZTLIDO 1.8 % PTCH, Place 1 patch onto the skin 2 (two) times daily. , Disp: , Rfl:   Assessment/ Plan: 63 y.o. female   1. LLQ pain I am not entirely convinced that she is having a diverticulitis presentation given absence of fever, blood in stool and diarrhea.  Because she is having lower sided abdominal pain I will place her on Augmentin to empirically treat for possible colonic infection.  This may also cover some urinary tract infection.  I have scheduled a follow-up  visit in 3 days.  If no significant improvement could consider adding Flagyl.  We discussed reasons for emergent evaluation reevaluation. - amoxicillin-clavulanate (AUGMENTIN) 875-125 MG tablet; Take 1 tablet by mouth 2 (two) times daily.  Dispense: 20 tablet; Refill: 0  2. Scabies - permethrin (ELIMITE) 5 % cream; Massage cream from head to soles of feet; leave on for 8 to 14 hours before rinsing off.  Dispense: 60 g; Refill: 0   Start time: 8:01am End time: 8:13am  Total time spent on patient care (including telephone call/ virtual visit): 17 minutes  Janora Norlander, DO [facility] [phone]

## 2018-08-10 ENCOUNTER — Other Ambulatory Visit: Payer: Self-pay

## 2018-08-11 ENCOUNTER — Ambulatory Visit (INDEPENDENT_AMBULATORY_CARE_PROVIDER_SITE_OTHER): Payer: Medicare Other | Admitting: Family Medicine

## 2018-08-11 DIAGNOSIS — R1032 Left lower quadrant pain: Secondary | ICD-10-CM

## 2018-08-11 MED ORDER — METRONIDAZOLE 500 MG PO TABS: 500.0000 mg | ORAL_TABLET | Freq: Three times a day (TID) | ORAL | 0 refills | Status: AC

## 2018-08-11 NOTE — Progress Notes (Signed)
Telephone visit  Subjective: CC: LLQ PCP: Dettinger, Fransisca Kaufmann, MD FUX:NATFT Carlean Jews Teresa [name] is a 63 y.o. female calls for telephone consult today. Patient provides verbal consent for consult held via phone.  Location of patient: home Location of provider: [facility] Others present for call: none  1. LLQ abdominal pain Patient reports that she has ongoing LLQ abdominal pain.  She is tolerating oral intake without difficulty. Denies fevers, chills, nausea, vomiting, hematochezia, melena.  No dysuria, hematuria or increased urinary frequency.  She has not had a bowel movement today.     ROS: Per HPI  Allergies  Allergen Reactions  . Lyrica [Pregabalin] Shortness Of Breath  . Trazodone And Nefazodone Shortness Of Breath  . Amitriptyline   . Arthrotec [Diclofenac-Misoprostol] Other (See Comments)    Upset stomach  . Keflex [Cephalexin] Other (See Comments)    Unknown reaction  . Naproxen Other (See Comments)    Irritates stomach  . Prozac [Fluoxetine Hcl] Other (See Comments)    "makes me feel bad"  . Remeron [Mirtazapine] Other (See Comments)    "off balance"  . Zolpidem Other (See Comments)    Other  . Ambien [Zolpidem Tartrate] Other (See Comments)    COULDN'T FUNCTION, STAYED SLEEPY   Past Medical History:  Diagnosis Date  . Anxiety state, unspecified   . Asthma    prn inhaler  . Chronic back pain greater than 3 months duration   . Colon polyp   . Degeneration of intervertebral disc, site unspecified   . Depressive disorder, not elsewhere classified   . Diverticulitis   . Diverticulosis 07-08-2005   colonoscopy  . Esophageal stricture 2014  . Full dentures   . GERD (gastroesophageal reflux disease) 07-08-2005   EGD  . Hepatitis C   . Hiatal hernia 07-08-2005   EGD  . Hypertension   . Insomnia   . Irritable bowel syndrome   . Migraine   . Muscle spasm   . Osteoarthrosis, unspecified whether generalized or localized, lower leg   . Papilloma of breast 09/2011   left  .  Seasonal allergies   . Seizures ([place]) 1990s   x 1, unknown cause; no seizures since; no meds, unknown etiology  . Vocal cord polyp    history of    Current Outpatient Medications:  .  albuterol (PROVENTIL) (2.5 MG/3ML) 0.083% nebulizer solution, NEBULIZE 1 VIAL EVERY 6 HOURS AS NEEDED FOR SHORTNESS OF BREATH, Disp: 150 mL, Rfl: 1 .  amoxicillin-clavulanate (AUGMENTIN) 875-125 MG tablet, Take 1 tablet by mouth 2 (two) times daily., Disp: 20 tablet, Rfl: 0 .  buprenorphine (BUTRANS - DOSED MCG/HR) 10 MCG/HR PTWK patch, Place 10 mcg onto the skin once a week. , Disp: , Rfl:  .  buPROPion (WELLBUTRIN XL) 300 MG 24 hr tablet, Take 1 tablet (300 mg total) by mouth daily. TAKE ONE (1) TABLET EACH DAY, Disp: 90 tablet, Rfl: 1 .  cetirizine (ZYRTEC) 10 MG tablet, TAKE ONE TABLET EVERY MORNING, Disp: 30 tablet, Rfl: 3 .  citalopram (CELEXA) 20 MG tablet, Take 1 tablet (20 mg total) by mouth daily., Disp: 30 tablet, Rfl: 5 .  cyclobenzaprine (FLEXERIL) 10 MG tablet, Take 10 mg by mouth 3 (three) times daily as needed for muscle spasms. , Disp: , Rfl:  .  dexlansoprazole (DEXILANT) 60 MG capsule, TAKE ONE (1) CAPSULE EACH DAY, Disp: 90 capsule, Rfl: 1 .  diazepam (VALIUM) 10 MG tablet, Take 10 mg by mouth every 12 (twelve) hours as needed for anxiety. , Disp: ,  Rfl:  .  diclofenac sodium (VOLTAREN) 1 % GEL, Apply 1 application topically 2 (two) times daily as needed (pain). , Disp: , Rfl:  .  docusate sodium (COLACE) 100 MG capsule, Take 100-200 mg by mouth daily as needed for mild constipation. , Disp: , Rfl:  .  linaclotide (LINZESS) 72 MCG capsule, Take 1 capsule (72 mcg total) by mouth daily before breakfast., Disp: 30 capsule, Rfl: 2 .  lindane lotion (KWELL) 1 %, APPLY ONCE, Disp: 60 mL, Rfl: 1 .  lisinopril (PRINIVIL,ZESTRIL) 10 MG tablet, Take 10 mg by mouth daily., Disp: , Rfl:  .  mometasone (NASONEX) 50 MCG/ACT nasal spray, USE 2 SPRAYS IN EACH NOSTRIL ONCE DAILY, Disp: 51 g, Rfl: 2 .   montelukast (SINGULAIR) 10 MG tablet, TAKE ONE (1) TABLET EACH DAY, Disp: 90 tablet, Rfl: 0 .  Olopatadine HCl 0.2 % SOLN, Place 1 drop into both eyes daily as needed for irritation., Disp: , Rfl:  .  ondansetron (ZOFRAN) 4 MG tablet, Take 4 mg by mouth every 8 (eight) hours as needed for nausea or vomiting. , Disp: , Rfl:  .  oxymorphone (OPANA) 5 MG tablet, Take 5 mg by mouth 2 (two) times daily. , Disp: , Rfl:  .  permethrin (ELIMITE) 5 % cream, Massage cream from head to soles of feet; leave on for 8 to 14 hours before rinsing off., Disp: 60 g, Rfl: 0 .  PROAIR HFA 108 (90 Base) MCG/ACT inhaler, USE 1 TO 2 PUFFS EVERY 4 TO 6 HOURS AS NEEDED (Patient taking differently: Inhale 1-2 puffs into the lungs every 4 (four) hours as needed for wheezing or shortness of breath. ), Disp: 25.5 g, Rfl: 4 .  SYMBICORT 160-4.5 MCG/ACT inhaler, USE 2 PUFFS TWICE DAILY, Disp: 10.2 g, Rfl: 5 .  triamcinolone ointment (KENALOG) 0.5 %, Apply 1 application topically 2 (two) times daily., Disp: 30 g, Rfl: 0 .  ZTLIDO 1.8 % PTCH, Place 1 patch onto the skin 2 (two) times daily. , Disp: , Rfl:   Assessment/ Plan: 63 y.o. female   1. LLQ abdominal pain Patient was placed to be seen in office today but unfortunately she decided that this needed to be a tele-visit.  Without a physical exam, I am not confident that we are treating her appropriately.  I will add Flagyl to her regimen for presumed diverticulitis that she said this is consistent with her typical diverticulitis.  We again discussed that I am concerned that she does not have an infection and in fact has constipation as she is not having regular bowel movements.  I would like her to be seen in office if she has ongoing symptoms.  We discussed reasons for emergent evaluation the emergency department.  She was good understanding. - metroNIDAZOLE (FLAGYL) 500 MG tablet; Take 1 tablet (500 mg total) by mouth 3 (three) times daily for 7 days.  Dispense: 21 tablet;  Refill: 0   Start time: 12:02pm End time: 12:06pm  Total time spent on patient care (including telephone call/ virtual visit): 10 minutes  [place], [place] [phone]

## 2018-08-14 ENCOUNTER — Other Ambulatory Visit: Payer: Self-pay | Admitting: Nurse Practitioner

## 2018-08-14 DIAGNOSIS — F339 Major depressive disorder, recurrent, unspecified: Secondary | ICD-10-CM

## 2018-08-23 ENCOUNTER — Other Ambulatory Visit: Payer: Self-pay | Admitting: Family Medicine

## 2018-08-23 ENCOUNTER — Telehealth: Payer: Self-pay | Admitting: Family Medicine

## 2018-08-23 MED ORDER — FLUCONAZOLE 150 MG PO TABS: 150.0000 mg | ORAL_TABLET | Freq: Once | ORAL | 0 refills | Status: AC

## 2018-08-23 NOTE — Telephone Encounter (Signed)
lmtcb

## 2018-08-23 NOTE — Telephone Encounter (Signed)
Diflucan sent

## 2018-08-29 ENCOUNTER — Other Ambulatory Visit: Payer: Self-pay | Admitting: Family Medicine

## 2018-09-08 ENCOUNTER — Other Ambulatory Visit: Payer: Self-pay | Admitting: Family Medicine

## 2018-09-14 ENCOUNTER — Other Ambulatory Visit: Payer: Self-pay | Admitting: Nurse Practitioner

## 2018-09-14 DIAGNOSIS — F339 Major depressive disorder, recurrent, unspecified: Secondary | ICD-10-CM

## 2018-09-15 ENCOUNTER — Telehealth: Payer: Self-pay | Admitting: Family Medicine

## 2018-09-15 ENCOUNTER — Other Ambulatory Visit: Payer: Self-pay

## 2018-09-15 ENCOUNTER — Encounter: Payer: Self-pay | Admitting: Physician Assistant

## 2018-09-15 ENCOUNTER — Ambulatory Visit (INDEPENDENT_AMBULATORY_CARE_PROVIDER_SITE_OTHER): Payer: Medicare Other | Admitting: Physician Assistant

## 2018-09-15 VITALS — BP 138/79 | HR 92 | Temp 97.4°F | Ht 62.0 in | Wt 150.2 lb

## 2018-09-15 DIAGNOSIS — L989 Disorder of the skin and subcutaneous tissue, unspecified: Secondary | ICD-10-CM

## 2018-09-15 DIAGNOSIS — B86 Scabies: Secondary | ICD-10-CM

## 2018-09-15 MED ORDER — PERMETHRIN 5 % EX CREA: TOPICAL_CREAM | CUTANEOUS | 0 refills | Status: DC

## 2018-09-15 MED ORDER — PERMETHRIN 5 % EX CREA: TOPICAL_CREAM | CUTANEOUS | 1 refills | Status: DC

## 2018-09-15 NOTE — Telephone Encounter (Signed)
Appointment scheduled.

## 2018-09-15 NOTE — Progress Notes (Signed)
BP 138/79   Pulse 92   Temp (!) 97.4 F (36.3 C) (Oral)   Ht 5\' 2"  (1.575 m)   Wt 150 lb 3.2 oz (68.1 kg)   BMI 27.47 kg/m    Subjective:    Patient ID: Teresa [name], female    DOB: 06/05/1955, 63 y.o.   MRN: [id number]  HPI: Teresa [name] is a 63 y.o. female presenting on 09/15/2018 for Insect Bite  This patient comes in with complaint of skin lesions.  She thinks that she had mites.  She had small bites on her wrist.  And now they are left distal worse.  She has a couple on her lower leg.  To me they are not obviously a scabies or bug lesion.  Some of them are in various stages of healing and with scabs.  There is no surrounding redness.  We do not have the capability of doing skin scraping at this time to really look at anything that could be going on with her skin.  I am can make dermatology referral for her.  This has been going on over the past year.  She has been treated by couple other providers in the office.  Past Medical History:  Diagnosis Date  . Anxiety state, unspecified   . Asthma    prn inhaler  . Chronic back pain greater than 3 months duration   . Colon polyp   . Degeneration of intervertebral disc, site unspecified   . Depressive disorder, not elsewhere classified   . Diverticulitis   . Diverticulosis 07-08-2005   colonoscopy  . Esophageal stricture 2014  . Full dentures   . GERD (gastroesophageal reflux disease) 07-08-2005   EGD  . Hepatitis C   . Hiatal hernia 07-08-2005   EGD  . Hypertension   . Insomnia   . Irritable bowel syndrome   . Migraine   . Muscle spasm   . Osteoarthrosis, unspecified whether generalized or localized, lower leg   . Papilloma of breast 09/2011   left  . Seasonal allergies   . Seizures ([place]) 1990s   x 1, unknown cause; no seizures since; no meds, unknown etiology  . Vocal cord polyp    history of   Relevant past medical, surgical, family and social history reviewed and updated as indicated. Interim medical history  since our last visit reviewed. Allergies and medications reviewed and updated. DATA REVIEWED: CHART IN EPIC  Family History reviewed for pertinent findings.  Review of Systems  Constitutional: Negative.   HENT: Negative.   Eyes: Negative.   Respiratory: Negative.   Gastrointestinal: Negative.   Genitourinary: Negative.   Skin: Positive for wound.    Allergies as of 09/15/2018      Reactions   Lyrica [pregabalin] Shortness Of Breath   Trazodone And Nefazodone Shortness Of Breath   Amitriptyline    Arthrotec [diclofenac-misoprostol] Other (See Comments)   Upset stomach   Keflex [cephalexin] Other (See Comments)   Unknown reaction   Naproxen Other (See Comments)   Irritates stomach   Prozac [fluoxetine Hcl] Other (See Comments)   "makes me feel bad"   Remeron [mirtazapine] Other (See Comments)   "off balance"   Zolpidem Other (See Comments)   Other   Ambien [zolpidem Tartrate] Other (See Comments)   COULDN'T FUNCTION, STAYED SLEEPY      Medication List       Accurate as of September 15, 2018  4:02 PM. If you have any questions, ask your  nurse or doctor.        STOP taking these medications   lindane lotion 1 % Commonly known as: KWELL Stopped by: Terald Sleeper, PA-C     TAKE these medications   amoxicillin-clavulanate 875-125 MG tablet Commonly known as: AUGMENTIN Take 1 tablet by mouth 2 (two) times daily.   buprenorphine 10 MCG/HR Ptwk patch Commonly known as: BUTRANS Place 10 mcg onto the skin once a week.   buPROPion 300 MG 24 hr tablet Commonly known as: WELLBUTRIN XL Take 1 tablet (300 mg total) by mouth daily. TAKE ONE (1) TABLET EACH DAY   cetirizine 10 MG tablet Commonly known as: ZYRTEC TAKE ONE TABLET EVERY MORNING   citalopram 20 MG tablet Commonly known as: CELEXA Take 1 tablet (20 mg total) by mouth daily. (Needs to be seen before next refill)   cyclobenzaprine 10 MG tablet Commonly known as: FLEXERIL Take 10 mg by mouth 3 (three) times  daily as needed for muscle spasms.   Dexilant 60 MG capsule Generic drug: dexlansoprazole TAKE ONE (1) CAPSULE EACH DAY   diazepam 10 MG tablet Commonly known as: VALIUM Take 10 mg by mouth every 12 (twelve) hours as needed for anxiety.   diclofenac sodium 1 % Gel Commonly known as: VOLTAREN Apply 1 application topically 2 (two) times daily as needed (pain).   docusate sodium 100 MG capsule Commonly known as: COLACE Take 100-200 mg by mouth daily as needed for mild constipation.   linaclotide 72 MCG capsule Commonly known as: Linzess Take 1 capsule (72 mcg total) by mouth daily before breakfast.   lisinopril 10 MG tablet Commonly known as: ZESTRIL Take 10 mg by mouth daily.   mometasone 50 MCG/ACT nasal spray Commonly known as: NASONEX USE 2 SPRAYS IN EACH NOSTRIL ONCE DAILY   montelukast 10 MG tablet Commonly known as: SINGULAIR TAKE ONE (1) TABLET EACH DAY   Olopatadine HCl 0.2 % Soln Place 1 drop into both eyes daily as needed for irritation.   ondansetron 4 MG tablet Commonly known as: ZOFRAN Take 4 mg by mouth every 8 (eight) hours as needed for nausea or vomiting.   oxymorphone 5 MG tablet Commonly known as: OPANA Take 5 mg by mouth 2 (two) times daily.   permethrin 5 % cream Commonly known as: ELIMITE Massage cream from head to soles of feet; leave on for 8 to 14 hours before rinsing off.   ProAir HFA 108 (90 Base) MCG/ACT inhaler Generic drug: albuterol USE 1 TO 2 PUFFS EVERY 4 TO 6 HOURS AS NEEDED What changed: See the new instructions.   albuterol (2.5 MG/3ML) 0.083% nebulizer solution Commonly known as: PROVENTIL NEBULIZE 1 VIAL EVERY 6 HOURS AS NEEDED FOR SHORTNESS OF BREATH What changed: Another medication with the same name was changed. Make sure you understand how and when to take each.   Symbicort 160-4.5 MCG/ACT inhaler Generic drug: budesonide-formoterol USE 2 PUFFS TWICE DAILY   triamcinolone ointment 0.5 % Commonly known as: KENALOG  Apply 1 application topically 2 (two) times daily.   ZTlido 1.8 % Ptch Generic drug: Lidocaine Place 1 patch onto the skin 2 (two) times daily.          Objective:    BP 138/79   Pulse 92   Temp (!) 97.4 F (36.3 C) (Oral)   Ht 5\' 2"  (1.575 m)   Wt 150 lb 3.2 oz (68.1 kg)   BMI 27.47 kg/m   Allergies  Allergen Reactions  . Lyrica [Pregabalin] Shortness  Of Breath  . Trazodone And Nefazodone Shortness Of Breath  . Amitriptyline   . Arthrotec [Diclofenac-Misoprostol] Other (See Comments)    Upset stomach  . Keflex [Cephalexin] Other (See Comments)    Unknown reaction  . Naproxen Other (See Comments)    Irritates stomach  . Prozac [Fluoxetine Hcl] Other (See Comments)    "makes me feel bad"  . Remeron [Mirtazapine] Other (See Comments)    "off balance"  . Zolpidem Other (See Comments)    Other  . Ambien [Zolpidem Tartrate] Other (See Comments)    COULDN'T FUNCTION, STAYED SLEEPY    Wt Readings from Last 3 Encounters:  09/15/18 150 lb 3.2 oz (68.1 kg)  04/21/18 143 lb (64.9 kg)  04/07/18 143 lb 6.4 oz (65 kg)    Physical Exam Constitutional:      Appearance: She is well-developed.  HENT:     Head: Normocephalic and atraumatic.  Eyes:     Conjunctiva/sclera: Conjunctivae normal.     Pupils: Pupils are equal, round, and reactive to light.  Cardiovascular:     Rate and Rhythm: Normal rate and regular rhythm.     Heart sounds: Normal heart sounds.  Pulmonary:     Effort: Pulmonary effort is normal.  Skin:    General: Skin is warm and dry.     Findings: No rash.     Comments: Right wrist with 3 bite lesions that are in various stages of healing.  One is very dark scab, one is newer.  There is no redness or surrounding drainage.  There is a pink punctate lesion on both ankles.  There are no signs of excoriation.  Neurological:     Mental Status: She is alert and oriented to person, place, and time.     Deep Tendon Reflexes: Reflexes are normal and symmetric.   Psychiatric:        Behavior: Behavior normal.        Thought Content: Thought content normal.        Judgment: Judgment normal.         Assessment & Plan:    1. Scabies - permethrin (ELIMITE) 5 % cream; Massage cream from head to soles of feet; leave on for 8 to 14 hours before rinsing off.  Dispense: 60 g; Refill: 1 - Ambulatory referral to Dermatology  2. Skin lesion of right arm - Ambulatory referral to Dermatology    Continue all other maintenance medications as listed above.  Follow up plan: No follow-ups on file.  Educational handout given for [place] PA-C [place] [address]  [place], [place] [address] [phone]   09/15/2018, 4:02 PM

## 2018-09-22 ENCOUNTER — Other Ambulatory Visit: Payer: Self-pay | Admitting: Family Medicine

## 2018-09-22 DIAGNOSIS — B86 Scabies: Secondary | ICD-10-CM

## 2018-09-22 MED ORDER — ALBUTEROL SULFATE (2.5 MG/3ML) 0.083% IN NEBU: INHALATION_SOLUTION | RESPIRATORY_TRACT | 1 refills | Status: DC

## 2018-09-22 NOTE — Telephone Encounter (Signed)
What is the name of the medication? permethrin  Have you contacted your pharmacy to request a refill? yes  Which [facility]   Patient notified that their request is being sent to the clinical staff for review and that they should receive a call once it is complete. If they do not receive a call within 24 hours they can check with their pharmacy or our office.

## 2018-09-22 NOTE — Telephone Encounter (Signed)
Permethrin has refill on file at [facility] Refill albuterol

## 2018-09-25 ENCOUNTER — Other Ambulatory Visit: Payer: Self-pay | Admitting: Family Medicine

## 2018-09-25 DIAGNOSIS — F331 Major depressive disorder, recurrent, moderate: Secondary | ICD-10-CM

## 2018-10-02 DIAGNOSIS — G5600 Carpal tunnel syndrome, unspecified upper limb: Secondary | ICD-10-CM | POA: Diagnosis not present

## 2018-10-02 DIAGNOSIS — M25569 Pain in unspecified knee: Secondary | ICD-10-CM | POA: Diagnosis not present

## 2018-10-13 ENCOUNTER — Other Ambulatory Visit: Payer: Self-pay | Admitting: Nurse Practitioner

## 2018-10-13 DIAGNOSIS — F339 Major depressive disorder, recurrent, unspecified: Secondary | ICD-10-CM

## 2018-10-16 ENCOUNTER — Other Ambulatory Visit: Payer: Self-pay

## 2018-10-16 ENCOUNTER — Ambulatory Visit (INDEPENDENT_AMBULATORY_CARE_PROVIDER_SITE_OTHER): Payer: Medicare Other | Admitting: Family Medicine

## 2018-10-16 ENCOUNTER — Encounter: Payer: Self-pay | Admitting: Family Medicine

## 2018-10-16 VITALS — BP 108/71 | HR 102 | Temp 98.4°F | Ht 62.0 in | Wt 153.2 lb

## 2018-10-16 DIAGNOSIS — F329 Major depressive disorder, single episode, unspecified: Secondary | ICD-10-CM

## 2018-10-16 DIAGNOSIS — F339 Major depressive disorder, recurrent, unspecified: Secondary | ICD-10-CM | POA: Diagnosis not present

## 2018-10-16 DIAGNOSIS — F32A Depression, unspecified: Secondary | ICD-10-CM

## 2018-10-16 DIAGNOSIS — K5792 Diverticulitis of intestine, part unspecified, without perforation or abscess without bleeding: Secondary | ICD-10-CM

## 2018-10-16 DIAGNOSIS — F331 Major depressive disorder, recurrent, moderate: Secondary | ICD-10-CM | POA: Diagnosis not present

## 2018-10-16 DIAGNOSIS — I10 Essential (primary) hypertension: Secondary | ICD-10-CM | POA: Diagnosis not present

## 2018-10-16 MED ORDER — CITALOPRAM HYDROBROMIDE 20 MG PO TABS: ORAL_TABLET | ORAL | 2 refills | Status: DC

## 2018-10-16 MED ORDER — BUPROPION HCL ER (XL) 300 MG PO TB24: ORAL_TABLET | ORAL | 1 refills | Status: DC

## 2018-10-16 MED ORDER — METRONIDAZOLE 500 MG PO TABS: 500.0000 mg | ORAL_TABLET | Freq: Two times a day (BID) | ORAL | 0 refills | Status: DC

## 2018-10-16 MED ORDER — CIPROFLOXACIN HCL 500 MG PO TABS: 500.0000 mg | ORAL_TABLET | Freq: Two times a day (BID) | ORAL | 0 refills | Status: DC

## 2018-10-16 MED ORDER — CETIRIZINE HCL 10 MG PO TABS: 10.0000 mg | ORAL_TABLET | Freq: Every morning | ORAL | 3 refills | Status: DC

## 2018-10-16 NOTE — Progress Notes (Signed)
BP 108/71   Pulse (!) 102   Temp 98.4 F (36.9 C) (Temporal)   Ht 5\' 2"  (1.575 m)   Wt 153 lb 3.2 oz (69.5 kg)   BMI 28.02 kg/m    Subjective:   Patient ID: Teresa [name], female    DOB: 08-03-1955, 63 y.o.   MRN: [id number]  HPI: Teresa [name] is a 63 y.o. female presenting on 10/16/2018 for Depression (check up of chronic medical conditions) and Diverticulitis (x 2 days)   HPI Depression recheck She is coming in today for depression recheck and says that she is doing relatively well with this and denies any major issues with it.  She denies any suicidal ideations or thoughts of hurting self.  She says the medication is working very well for her.  She is currently on Wellbutrin and citalopram.  She also takes Valium which she gets from another provider and she is also currently on Opana for pain management that she gets from another provider. Depression screen [facility] 2/9 10/16/2018 02/24/2018 02/07/2018 01/27/2018 01/24/2018  Decreased Interest 0 1 1 1  0  Down, Depressed, Hopeless 1 1 0 0 0  PHQ - 2 Score 1 2 1 1  0  Altered sleeping - 1 0 - -  Tired, decreased energy - 1 0 - -  Change in appetite - 1 0 - -  Feeling bad or failure about yourself  - 0 0 - -  Trouble concentrating - 1 0 - -  Moving slowly or fidgety/restless - 0 0 - -  Suicidal thoughts - 0 0 - -  PHQ-9 Score - 6 1 - -  Some recent data might be hidden    Patient feels like she is getting the onset of her diverticulitis with some left lower abdominal pain and diarrhea that started over the past couple days.  She denies any fevers or chills.  She denies any blood in her stool but she does feel like it is what she gets when she gets the diverticulitis she would like treatment for that.  Hypertension Patient is currently on no medication currently and has been diet controlled, and their blood pressure today is 108/71. Patient denies any lightheadedness or dizziness. Patient denies headaches, blurred vision, chest  pains, shortness of breath, or weakness. Denies any side effects from medication and is content with current medication.   Relevant past medical, surgical, family and social history reviewed and updated as indicated. Interim medical history since our last visit reviewed. Allergies and medications reviewed and updated.  Review of Systems  Constitutional: Negative for chills and fever.  Eyes: Negative for visual disturbance.  Respiratory: Negative for chest tightness and shortness of breath.   Cardiovascular: Negative for chest pain and leg swelling.  Gastrointestinal: Positive for abdominal pain and diarrhea. Negative for nausea and vomiting.  Musculoskeletal: Negative for back pain and gait problem.  Skin: Negative for rash.  Neurological: Negative for dizziness, light-headedness and headaches.  Psychiatric/Behavioral: Negative for agitation and behavioral problems.  All other systems reviewed and are negative.   Per HPI unless specifically indicated above   Allergies as of 10/16/2018      Reactions   Lyrica [pregabalin] Shortness Of Breath   Trazodone And Nefazodone Shortness Of Breath   Amitriptyline    Arthrotec [diclofenac-misoprostol] Other (See Comments)   Upset stomach   Keflex [cephalexin] Other (See Comments)   Unknown reaction   Naproxen Other (See Comments)   Irritates stomach   Prozac [fluoxetine Hcl] Other (  See Comments)   "makes me feel bad"   Remeron [mirtazapine] Other (See Comments)   "off balance"   Zolpidem Other (See Comments)   Other   Ambien [zolpidem Tartrate] Other (See Comments)   COULDN'T FUNCTION, STAYED SLEEPY      Medication List       Accurate as of October 16, 2018  4:56 PM. If you have any questions, ask your nurse or doctor.        STOP taking these medications   amoxicillin-clavulanate 875-125 MG tablet Commonly known as: AUGMENTIN Stopped by: Fransisca Kaufmann Chi Woodham, MD   permethrin 5 % cream Commonly known as: ELIMITE Stopped by:  Fransisca Kaufmann Veva Grimley, MD     TAKE these medications   buprenorphine 10 MCG/HR Ptwk patch Commonly known as: BUTRANS Place 10 mcg onto the skin once a week.   buPROPion 300 MG 24 hr tablet Commonly known as: WELLBUTRIN XL TAKE ONE (1) TABLET EACH DAY   cetirizine 10 MG tablet Commonly known as: ZYRTEC Take 1 tablet (10 mg total) by mouth every morning.   ciprofloxacin 500 MG tablet Commonly known as: Cipro Take 1 tablet (500 mg total) by mouth 2 (two) times daily. Started by: Fransisca Kaufmann Yahye Siebert, MD   citalopram 20 MG tablet Commonly known as: CELEXA TAKE ONE (1) TABLET EACH DAY   cyclobenzaprine 10 MG tablet Commonly known as: FLEXERIL Take 10 mg by mouth 3 (three) times daily as needed for muscle spasms.   Dexilant 60 MG capsule Generic drug: dexlansoprazole TAKE ONE (1) CAPSULE EACH DAY   diazepam 10 MG tablet Commonly known as: VALIUM Take 10 mg by mouth every 12 (twelve) hours as needed for anxiety.   diclofenac sodium 1 % Gel Commonly known as: VOLTAREN Apply 1 application topically 2 (two) times daily as needed (pain).   docusate sodium 100 MG capsule Commonly known as: COLACE Take 100-200 mg by mouth daily as needed for mild constipation.   linaclotide 72 MCG capsule Commonly known as: Linzess Take 1 capsule (72 mcg total) by mouth daily before breakfast.   lisinopril 10 MG tablet Commonly known as: ZESTRIL Take 10 mg by mouth daily.   metroNIDAZOLE 500 MG tablet Commonly known as: Flagyl Take 1 tablet (500 mg total) by mouth 2 (two) times daily. Started by: Fransisca Kaufmann Jan Olano, MD   mometasone 50 MCG/ACT nasal spray Commonly known as: NASONEX USE 2 SPRAYS IN EACH NOSTRIL ONCE DAILY   montelukast 10 MG tablet Commonly known as: SINGULAIR TAKE ONE (1) TABLET EACH DAY   Olopatadine HCl 0.2 % Soln Place 1 drop into both eyes daily as needed for irritation.   ondansetron 4 MG tablet Commonly known as: ZOFRAN Take 4 mg by mouth every 8 (eight)  hours as needed for nausea or vomiting.   oxymorphone 5 MG tablet Commonly known as: OPANA Take 5 mg by mouth 2 (two) times daily.   ProAir HFA 108 (90 Base) MCG/ACT inhaler Generic drug: albuterol USE 1 TO 2 PUFFS EVERY 4 TO 6 HOURS AS NEEDED What changed: See the new instructions.   albuterol (2.5 MG/3ML) 0.083% nebulizer solution Commonly known as: PROVENTIL NEBULIZE 1 VIAL EVERY 6 HOURS AS NEEDED FOR SHORTNESS OF BREATH What changed: Another medication with the same name was changed. Make sure you understand how and when to take each.   Symbicort 160-4.5 MCG/ACT inhaler Generic drug: budesonide-formoterol USE 2 PUFFS TWICE DAILY   triamcinolone ointment 0.5 % Commonly known as: KENALOG Apply 1 application topically 2 (  two) times daily.   ZTlido 1.8 % Ptch Generic drug: Lidocaine Place 1 patch onto the skin 2 (two) times daily.        Objective:   BP 108/71   Pulse (!) 102   Temp 98.4 F (36.9 C) (Temporal)   Ht 5\' 2"  (1.575 m)   Wt 153 lb 3.2 oz (69.5 kg)   BMI 28.02 kg/m   Wt Readings from Last 3 Encounters:  10/16/18 153 lb 3.2 oz (69.5 kg)  09/15/18 150 lb 3.2 oz (68.1 kg)  04/21/18 143 lb (64.9 kg)    Physical Exam Vitals signs and nursing note reviewed.  Constitutional:      General: She is not in acute distress.    Appearance: She is well-developed. She is not diaphoretic.  Eyes:     Conjunctiva/sclera: Conjunctivae normal.  Cardiovascular:     Rate and Rhythm: Normal rate and regular rhythm.     Heart sounds: Normal heart sounds. No murmur.  Pulmonary:     Effort: Pulmonary effort is normal. No respiratory distress.     Breath sounds: Normal breath sounds. No wheezing.  Abdominal:     General: Abdomen is flat. Bowel sounds are normal. There is no distension.     Tenderness: There is abdominal tenderness (Mild left lower quadrant). There is no right CVA tenderness or left CVA tenderness.  Musculoskeletal: Normal range of motion.         General: No tenderness.  Skin:    General: Skin is warm and dry.     Findings: No rash.  Neurological:     Mental Status: She is alert and oriented to person, place, and time.     Coordination: Coordination normal.  Psychiatric:        Behavior: Behavior normal.       Assessment & Plan:   Problem List Items Addressed This Visit      Cardiovascular and Mediastinum   HTN (hypertension)     Other   Depression - Primary   Relevant Medications   citalopram (CELEXA) 20 MG tablet   buPROPion (WELLBUTRIN XL) 300 MG 24 hr tablet   Major depressive disorder, recurrent episode, moderate (HCC)   Relevant Medications   citalopram (CELEXA) 20 MG tablet   buPROPion (WELLBUTRIN XL) 300 MG 24 hr tablet    Other Visit Diagnoses    Depression, recurrent (HCC)       Relevant Medications   citalopram (CELEXA) 20 MG tablet   buPROPion (WELLBUTRIN XL) 300 MG 24 hr tablet   Diverticulitis       Relevant Medications   ciprofloxacin (CIPRO) 500 MG tablet   metroNIDAZOLE (FLAGYL) 500 MG tablet    Continue Celexa and Wellbutrin  We will treat for possible symptomatic diverticulitis with Cipro and Flagyl.  Follow up plan: Return in about 6 months (around 04/18/2019), or if symptoms worsen or fail to improve, for Depression recheck.  Counseling provided for all of the vaccine components No orders of the defined types were placed in this encounter.   Caryl Pina, MD [facility] 10/16/2018, 4:56 PM

## 2018-10-18 ENCOUNTER — Other Ambulatory Visit: Payer: Self-pay | Admitting: Family Medicine

## 2018-10-18 DIAGNOSIS — J4540 Moderate persistent asthma, uncomplicated: Secondary | ICD-10-CM

## 2018-10-25 ENCOUNTER — Other Ambulatory Visit: Payer: Self-pay | Admitting: Family Medicine

## 2018-11-01 DIAGNOSIS — G5600 Carpal tunnel syndrome, unspecified upper limb: Secondary | ICD-10-CM | POA: Diagnosis not present

## 2018-11-01 DIAGNOSIS — M25569 Pain in unspecified knee: Secondary | ICD-10-CM | POA: Diagnosis not present

## 2018-11-07 DIAGNOSIS — G5603 Carpal tunnel syndrome, bilateral upper limbs: Secondary | ICD-10-CM | POA: Diagnosis not present

## 2018-11-30 ENCOUNTER — Other Ambulatory Visit: Payer: Self-pay | Admitting: Family Medicine

## 2018-12-07 ENCOUNTER — Other Ambulatory Visit: Payer: Self-pay | Admitting: Family Medicine

## 2018-12-07 DIAGNOSIS — J4540 Moderate persistent asthma, uncomplicated: Secondary | ICD-10-CM

## 2018-12-13 ENCOUNTER — Other Ambulatory Visit: Payer: Self-pay | Admitting: Family

## 2019-01-03 ENCOUNTER — Encounter: Payer: Self-pay | Admitting: Nurse Practitioner

## 2019-01-03 ENCOUNTER — Ambulatory Visit (INDEPENDENT_AMBULATORY_CARE_PROVIDER_SITE_OTHER): Payer: Medicare Other | Admitting: Nurse Practitioner

## 2019-01-03 DIAGNOSIS — K5732 Diverticulitis of large intestine without perforation or abscess without bleeding: Secondary | ICD-10-CM | POA: Diagnosis not present

## 2019-01-03 MED ORDER — METRONIDAZOLE 500 MG PO TABS: 500.0000 mg | ORAL_TABLET | Freq: Two times a day (BID) | ORAL | 0 refills | Status: DC

## 2019-01-03 MED ORDER — CIPROFLOXACIN HCL 500 MG PO TABS: 500.0000 mg | ORAL_TABLET | Freq: Two times a day (BID) | ORAL | 0 refills | Status: DC

## 2019-01-03 NOTE — Progress Notes (Signed)
   Virtual Visit via telephone Note Due to COVID-19 pandemic this visit was conducted virtually. This visit type was conducted due to national recommendations for restrictions regarding the COVID-19 Pandemic (e.g. social distancing, sheltering in place) in an effort to limit this patient's exposure and mitigate transmission in our community. All issues noted in this document were discussed and addressed.  A physical exam was not performed with this format.  I connected with Teresa [name] on 01/03/19 at 2:20 by telephone and verified that I am speaking with the correct person using two identifiers. Teresa [name] is currently located at home and  No one is currently with her during visit. The provider, Mary-Margaret Hassell Done, FNP is located in their office at time of visit.  I discussed the limitations, risks, security and privacy concerns of performing an evaluation and management service by telephone and the availability of in person appointments. I also discussed with the patient that there may be a patient responsible charge related to this service. The patient expressed understanding and agreed to proceed.   History and Present Illness:   Chief Complaint: Diarrhea   HPI Patient calls in c/o of loose stools that started 2 days ago. Developed pain yesterday in left lower quadrant. She has I=diverticulosis that flares up sometimes.   Review of Systems  Constitutional: Positive for chills. Negative for diaphoresis, fever and weight loss.  Eyes: Negative for blurred vision, double vision and pain.  Respiratory: Negative for shortness of breath.   Cardiovascular: Negative for chest pain, palpitations, orthopnea and leg swelling.  Gastrointestinal: Positive for abdominal pain (left lower quadrant) and diarrhea.  Skin: Negative for rash.  Neurological: Negative for dizziness, sensory change, loss of consciousness, weakness and headaches.  Endo/Heme/Allergies: Negative for polydipsia. Does not  bruise/bleed easily.  Psychiatric/Behavioral: Negative for memory loss. The patient does not have insomnia.   All other systems reviewed and are negative.    Observations/Objective: Alert and oriented- answers all questions appropriately No distress    Assessment and Plan: Teresa [name] in today with chief complaint of Diarrhea   1. Diverticulitis of large intestine without perforation or abscess without bleeding Watch diet - avoid foods with seeds, and undigestable skin - metroNIDAZOLE (FLAGYL) 500 MG tablet; Take 1 tablet (500 mg total) by mouth 2 (two) times daily.  Dispense: 14 tablet; Refill: 0 - ciprofloxacin (CIPRO) 500 MG tablet; Take 1 tablet (500 mg total) by mouth 2 (two) times daily.  Dispense: 14 tablet; Refill: 0   Follow Up Instructions: prn    I discussed the assessment and treatment plan with the patient. The patient was provided an opportunity to ask questions and all were answered. The patient agreed with the plan and demonstrated an understanding of the instructions.   The patient was advised to call back or seek an in-person evaluation if the symptoms worsen or if the condition fails to improve as anticipated.  The above assessment and management plan was discussed with the patient. The patient verbalized understanding of and has agreed to the management plan. Patient is aware to call the clinic if symptoms persist or worsen. Patient is aware when to return to the clinic for a follow-up visit. Patient educated on when it is appropriate to go to the emergency department.   Time call ended:  2:33 I provided 13 minutes of non-face-to-face time during this encounter.    Mary-Margaret Hassell Done, FNP

## 2019-01-10 ENCOUNTER — Other Ambulatory Visit: Payer: Self-pay | Admitting: Family Medicine

## 2019-01-16 ENCOUNTER — Other Ambulatory Visit: Payer: Self-pay | Admitting: Family Medicine

## 2019-01-22 DIAGNOSIS — M25569 Pain in unspecified knee: Secondary | ICD-10-CM | POA: Diagnosis not present

## 2019-01-22 DIAGNOSIS — Z79891 Long term (current) use of opiate analgesic: Secondary | ICD-10-CM | POA: Diagnosis not present

## 2019-01-22 DIAGNOSIS — M545 Low back pain: Secondary | ICD-10-CM | POA: Diagnosis not present

## 2019-01-22 DIAGNOSIS — G5600 Carpal tunnel syndrome, unspecified upper limb: Secondary | ICD-10-CM | POA: Diagnosis not present

## 2019-01-23 ENCOUNTER — Telehealth (INDEPENDENT_AMBULATORY_CARE_PROVIDER_SITE_OTHER): Payer: Self-pay | Admitting: Internal Medicine

## 2019-01-23 NOTE — Telephone Encounter (Signed)
Patient called wanted to know if and when she should have lab work done because of her Hep C - stated it had been a while  -  Please advise - ph# [phone]

## 2019-01-26 ENCOUNTER — Other Ambulatory Visit: Payer: Self-pay | Admitting: Family Medicine

## 2019-01-31 NOTE — Telephone Encounter (Signed)
I will address with Dr.Rehman and then contact the patient. Last Hepatitis C RNA Quant was drawn 10/18/2016.

## 2019-02-01 ENCOUNTER — Ambulatory Visit (INDEPENDENT_AMBULATORY_CARE_PROVIDER_SITE_OTHER): Payer: Medicare Other | Admitting: Family Medicine

## 2019-02-01 ENCOUNTER — Encounter: Payer: Self-pay | Admitting: Family Medicine

## 2019-02-01 DIAGNOSIS — B86 Scabies: Secondary | ICD-10-CM | POA: Diagnosis not present

## 2019-02-01 MED ORDER — PERMETHRIN 5 % EX CREA: 1.0000 "application " | TOPICAL_CREAM | Freq: Once | CUTANEOUS | 0 refills | Status: AC

## 2019-02-01 NOTE — Progress Notes (Signed)
Virtual Visit via telephone Note  I connected with Teresa [name] on 02/01/19 at [phone] by telephone and verified that I am speaking with the correct person using two identifiers. Teresa [name] is currently located at home and no other people are currently with her during visit. The provider, Fransisca Kaufmann Dettinger, MD is located in their office at time of visit.  Call ended at [phone]  I discussed the limitations, risks, security and privacy concerns of performing an evaluation and management service by telephone and the availability of in person appointments. I also discussed with the patient that there may be a patient responsible charge related to this service. The patient expressed understanding and agreed to proceed.   History and Present Illness: Patient is calling in for a rash  On her right arm and left arm and looks the same as it did before and she has been having this since staying at sister's house.  She got it last time at the same time as well. She denies any fevers or chills or redness or warmth. She denies any spreading any.  She says is skin colored and scaly and somewhat itchy as well.  No diagnosis found.  Outpatient Encounter Medications as of 02/01/2019  Medication Sig  . albuterol (PROVENTIL) (2.5 MG/3ML) 0.083% nebulizer solution NEBULIZE 1 VIAL EVERY 6 HOURS AS NEEDED FOR SHORTNESS OF BREATH  . albuterol (VENTOLIN HFA) 108 (90 Base) MCG/ACT inhaler USE 1 TO 2 PUFFS EVERY 4 TO 6 HOURS AS NEEDED  . buprenorphine (BUTRANS - DOSED MCG/HR) 10 MCG/HR PTWK patch Place 10 mcg onto the skin once a week.   Marland Kitchen buPROPion (WELLBUTRIN XL) 300 MG 24 hr tablet TAKE ONE (1) TABLET EACH DAY  . cetirizine (ZYRTEC) 10 MG tablet Take 1 tablet (10 mg total) by mouth every morning.  . ciprofloxacin (CIPRO) 500 MG tablet Take 1 tablet (500 mg total) by mouth 2 (two) times daily.  . citalopram (CELEXA) 20 MG tablet TAKE ONE (1) TABLET EACH DAY  . cyclobenzaprine (FLEXERIL) 10 MG tablet Take 10 mg by  mouth 3 (three) times daily as needed for muscle spasms.   Marland Kitchen dexlansoprazole (DEXILANT) 60 MG capsule TAKE ONE (1) CAPSULE EACH DAY  . diazepam (VALIUM) 10 MG tablet Take 10 mg by mouth every 12 (twelve) hours as needed for anxiety.   . diclofenac sodium (VOLTAREN) 1 % GEL Apply 1 application topically 2 (two) times daily as needed (pain).   Marland Kitchen docusate sodium (COLACE) 100 MG capsule Take 100-200 mg by mouth daily as needed for mild constipation.   Marland Kitchen linaclotide (LINZESS) 72 MCG capsule Take 1 capsule (72 mcg total) by mouth daily before breakfast.  . lisinopril (ZESTRIL) 10 MG tablet TAKE ONE (1) TABLET EACH DAY  . metroNIDAZOLE (FLAGYL) 500 MG tablet Take 1 tablet (500 mg total) by mouth 2 (two) times daily.  . mometasone (NASONEX) 50 MCG/ACT nasal spray USE 2 SPRAYS IN EACH NOSTRIL ONCE DAILY  . montelukast (SINGULAIR) 10 MG tablet TAKE ONE (1) TABLET EACH DAY  . Olopatadine HCl 0.2 % SOLN Place 1 drop into both eyes daily as needed for irritation.  . ondansetron (ZOFRAN) 4 MG tablet Take 4 mg by mouth every 8 (eight) hours as needed for nausea or vomiting.   Marland Kitchen oxymorphone (OPANA) 5 MG tablet Take 5 mg by mouth 2 (two) times daily.   . SYMBICORT 160-4.5 MCG/ACT inhaler USE 2 PUFFS TWICE DAILY  . triamcinolone ointment (KENALOG) 0.5 % Apply 1 application  topically 2 (two) times daily.  Marland Kitchen ZTLIDO 1.8 % PTCH Place 1 patch onto the skin 2 (two) times daily.    No facility-administered encounter medications on file as of 02/01/2019.     Review of Systems  Constitutional: Negative for chills and fever.  Eyes: Negative for visual disturbance.  Respiratory: Negative for chest tightness and shortness of breath.   Cardiovascular: Negative for chest pain and leg swelling.  Musculoskeletal: Negative for back pain and gait problem.  Skin: Positive for rash. Negative for color change.  Neurological: Negative for light-headedness and headaches.  Psychiatric/Behavioral: Negative for agitation and  behavioral problems.  All other systems reviewed and are negative.   Observations/Objective: Patient sounds comfortable and in no acute distress  Assessment and Plan: Problem List Items Addressed This Visit    None    Visit Diagnoses    Scabies    -  Primary   Relevant Medications   permethrin (ELIMITE) 5 % cream       Follow Up Instructions:  Follow-up as needed, if not improved again the we will consider dermatology.   I discussed the assessment and treatment plan with the patient. The patient was provided an opportunity to ask questions and all were answered. The patient agreed with the plan and demonstrated an understanding of the instructions.   The patient was advised to call back or seek an in-person evaluation if the symptoms worsen or if the condition fails to improve as anticipated.  The above assessment and management plan was discussed with the patient. The patient verbalized understanding of and has agreed to the management plan. Patient is aware to call the clinic if symptoms persist or worsen. Patient is aware when to return to the clinic for a follow-up visit. Patient educated on when it is appropriate to go to the emergency department.    I provided 7 minutes of non-face-to-face time during this encounter.    Worthy Rancher, MD

## 2019-02-05 ENCOUNTER — Other Ambulatory Visit (INDEPENDENT_AMBULATORY_CARE_PROVIDER_SITE_OTHER): Payer: Self-pay | Admitting: *Deleted

## 2019-02-05 DIAGNOSIS — Z86010 Personal history of colonic polyps: Secondary | ICD-10-CM

## 2019-02-05 NOTE — Telephone Encounter (Signed)
Patient was called and made aware that  She would not need another Hep C RNA Quant. Her last was 10/18/2016 , this was one year after her completing treatment. Per Dr.Rehman if the patient feels that she has risk factors ,exposure to Hep C , that she is okay. He recommends that she has her Hepatic Profile checked in Feb 2021.  Patient called and made aware.

## 2019-02-05 NOTE — Telephone Encounter (Signed)
Note : Patient states that she has no exposure to the Hep C.

## 2019-02-13 ENCOUNTER — Ambulatory Visit (INDEPENDENT_AMBULATORY_CARE_PROVIDER_SITE_OTHER): Payer: Medicare Other | Admitting: *Deleted

## 2019-02-13 ENCOUNTER — Other Ambulatory Visit: Payer: Self-pay

## 2019-02-13 DIAGNOSIS — Z0000 Encounter for general adult medical examination without abnormal findings: Secondary | ICD-10-CM

## 2019-02-13 NOTE — Patient Instructions (Signed)

## 2019-02-13 NOTE — Progress Notes (Signed)
MEDICARE ANNUAL WELLNESS VISIT  02/13/2019  Telephone Visit Disclaimer This Medicare AWV was conducted by telephone due to national recommendations for restrictions regarding the COVID-19 Pandemic (e.g. social distancing).  I verified, using two identifiers, that I am speaking with Teresa [name] or their authorized healthcare agent. I discussed the limitations, risks, security, and privacy concerns of performing an evaluation and management service by telephone and the potential availability of an in-person appointment in the future. The patient expressed understanding and agreed to proceed.   Subjective:  Teresa [name] is a 63 y.o. female patient of Dettinger, Fransisca Kaufmann, MD who had a Medicare Annual Wellness Visit today via telephone. Keilee is Disabled and lives with their grand daughter. she has 1 child. she reports that she is socially active and does interact with friends/family regularly. she is minimally physically active and enjoys fishing, collecting coins and playing games on her phone.  Patient Care Team: Dettinger, Fransisca Kaufmann, MD as PCP - General (Family Medicine) Irene Shipper, MD as Consulting Physician (Gastroenterology)  Advanced Directives 02/13/2019 04/28/2018 04/21/2018 02/27/2018 07/07/2016 05/15/2015 08/07/2014  Does Patient Have a Medical Advance Directive? No No No No No No No  Would patient like information on creating a medical advance directive? No - Patient declined No - Patient declined No - Patient declined No - Patient declined - No - patient declined information -  Pre-existing out of facility DNR order (yellow form or pink MOST form) - - - - - - -    Hospital Utilization Over the Past 12 Months: # of hospitalizations or ER visits: 0 # of surgeries: 0  Review of Systems    Patient reports that her overall health is unchanged compared to last year.  History obtained from chart review  Patient Reported Readings (BP, Pulse, CBG, Weight, etc) none  Pain  Assessment Pain : No/denies pain     Current Medications & Allergies (verified) Allergies as of 02/13/2019      Reactions   Lyrica [pregabalin] Shortness Of Breath   Trazodone And Nefazodone Shortness Of Breath   Amitriptyline    Arthrotec [diclofenac-misoprostol] Other (See Comments)   Upset stomach   Keflex [cephalexin] Other (See Comments)   Unknown reaction   Naproxen Other (See Comments)   Irritates stomach   Prozac [fluoxetine Hcl] Other (See Comments)   "makes me feel bad"   Remeron [mirtazapine] Other (See Comments)   "off balance"   Zolpidem Other (See Comments)   Other   Ambien [zolpidem Tartrate] Other (See Comments)   COULDN'T FUNCTION, STAYED SLEEPY      Medication List       Accurate as of February 13, 2019  3:00 PM. If you have any questions, ask your nurse or doctor.        STOP taking these medications   ciprofloxacin 500 MG tablet Commonly known as: Cipro   linaclotide 72 MCG capsule Commonly known as: Linzess   metroNIDAZOLE 500 MG tablet Commonly known as: Flagyl     TAKE these medications   albuterol 108 (90 Base) MCG/ACT inhaler Commonly known as: VENTOLIN HFA USE 1 TO 2 PUFFS EVERY 4 TO 6 HOURS AS NEEDED   albuterol (2.5 MG/3ML) 0.083% nebulizer solution Commonly known as: PROVENTIL NEBULIZE 1 VIAL EVERY 6 HOURS AS NEEDED FOR SHORTNESS OF BREATH   buprenorphine 10 MCG/HR Ptwk patch Commonly known as: BUTRANS Place 10 mcg onto the skin once a week.   buPROPion 300 MG 24 hr tablet Commonly  known as: WELLBUTRIN XL TAKE ONE (1) TABLET EACH DAY   cetirizine 10 MG tablet Commonly known as: ZYRTEC Take 1 tablet (10 mg total) by mouth every morning.   citalopram 20 MG tablet Commonly known as: CELEXA TAKE ONE (1) TABLET EACH DAY   cyclobenzaprine 10 MG tablet Commonly known as: FLEXERIL Take 10 mg by mouth 3 (three) times daily as needed for muscle spasms.   Dexilant 60 MG capsule Generic drug: dexlansoprazole TAKE ONE (1)  CAPSULE EACH DAY   diazepam 10 MG tablet Commonly known as: VALIUM Take 5 mg by mouth every 12 (twelve) hours as needed for anxiety.   diclofenac sodium 1 % Gel Commonly known as: VOLTAREN Apply 1 application topically 2 (two) times daily as needed (pain).   docusate sodium 100 MG capsule Commonly known as: COLACE Take 100-200 mg by mouth daily as needed for mild constipation.   lisinopril 10 MG tablet Commonly known as: ZESTRIL TAKE ONE (1) TABLET EACH DAY   mometasone 50 MCG/ACT nasal spray Commonly known as: NASONEX USE 2 SPRAYS IN EACH NOSTRIL ONCE DAILY   montelukast 10 MG tablet Commonly known as: SINGULAIR TAKE ONE (1) TABLET EACH DAY   Olopatadine HCl 0.2 % Soln Place 1 drop into both eyes daily as needed for irritation.   ondansetron 4 MG tablet Commonly known as: ZOFRAN Take 4 mg by mouth every 8 (eight) hours as needed for nausea or vomiting.   oxymorphone 5 MG tablet Commonly known as: OPANA Take 5 mg by mouth 2 (two) times daily.   Symbicort 160-4.5 MCG/ACT inhaler Generic drug: budesonide-formoterol USE 2 PUFFS TWICE DAILY   triamcinolone ointment 0.5 % Commonly known as: KENALOG Apply 1 application topically 2 (two) times daily.   ZTlido 1.8 % Ptch Generic drug: Lidocaine Place 1 patch onto the skin 2 (two) times daily.       History (reviewed): Past Medical History:  Diagnosis Date  . Anxiety state, unspecified   . Asthma    prn inhaler  . Chronic back pain greater than 3 months duration   . Colon polyp   . Degeneration of intervertebral disc, site unspecified   . Depressive disorder, not elsewhere classified   . Diverticulitis   . Diverticulosis 07-08-2005   colonoscopy  . Esophageal stricture 2014  . Full dentures   . GERD (gastroesophageal reflux disease) 07-08-2005   EGD  . Hepatitis C   . Hiatal hernia 07-08-2005   EGD  . Hypertension   . Insomnia   . Irritable bowel syndrome   . Migraine   . Muscle spasm   .  Osteoarthrosis, unspecified whether generalized or localized, lower leg   . Papilloma of breast 09/2011   left  . Seasonal allergies   . Seizures ([place]) 1990s   x 1, unknown cause; no seizures since; no meds, unknown etiology  . Vocal cord polyp    history of   Past Surgical History:  Procedure Laterality Date  . ABDOMINAL HYSTERECTOMY     partial  . BILATERAL SALPINGOOPHORECTOMY    . BLADDER SURGERY     bladder tack  . BREAST BIOPSY  11/03/2011   Procedure: BREAST BIOPSY WITH NEEDLE LOCALIZATION;  Surgeon: Harl Bowie, MD;  Location: [facility];  Service: General;  Laterality: Left;  needle localized left breast biopsy  . CHOLECYSTECTOMY    . COLONOSCOPY  07/08/2005   562.10  . COLONOSCOPY WITH PROPOFOL N/A 04/28/2018   Procedure: COLONOSCOPY WITH PROPOFOL;  Surgeon: Laural Golden,  Mechele Dawley, MD;  [facility];  Service: Endoscopy;  Laterality: N/A;  830  . CYSTOSCOPY  08/24/2011   Procedure: CYSTOSCOPY;  Surgeon: Reece Packer, MD;  Location: [facility];  Service: Urology;  Laterality: N/A;  Cystoscopy,  Rectocele Repair and Vault Prolapse Repair  . ESOPHAGOGASTRODUODENOSCOPY  10/03/2012   multiple   . OTHER SURGICAL HISTORY     exc. vocal cord polyp  . POLYPECTOMY  04/28/2018   Procedure: POLYPECTOMY;  Surgeon: Rogene Houston, MD;  Location: [facility];  Service: Endoscopy;;  colon  . RECTAL TUMOR BY PROCTOTOMY EXCISION    . RECTOCELE REPAIR  08/24/2011   Procedure: POSTERIOR REPAIR (RECTOCELE);  Surgeon: Reece Packer, MD;  Location: [facility];  Service: Urology;  Laterality: N/A;  . rectocelle repair    . TUMOR REMOVAL     benign  . VAGINAL PROLAPSE REPAIR  08/24/2011   Procedure: VAGINAL VAULT SUSPENSION;  Surgeon: Reece Packer, MD;  Location: [facility];  Service: Urology;  Laterality: N/A;   Family History  Problem Relation Age of Onset  . Prostate cancer Father   . Sleep apnea Father   . Stomach cancer Maternal Uncle   . Diabetes  Maternal Grandmother   . Stroke Maternal Grandmother   . Heart disease Paternal Grandmother   . Irritable bowel syndrome Other   . Stroke Maternal Grandfather    Social History   Socioeconomic History  . Marital status: Divorced    Spouse name: Not on file  . Number of children: 1  . Years of education: GED  . Highest education level: Not on file  Occupational History  . Occupation: Disabled  Tobacco Use  . Smoking status: Former Smoker    Packs/day: 1.50    Years: 0.50    Pack years: 0.75    Types: Cigarettes    Quit date: 08/20/1991    Years since quitting: 27.5  . Smokeless tobacco: Never Used  Substance and Sexual Activity  . Alcohol use: No    Alcohol/week: 0.0 standard drinks    Comment: quit 26 yrs ago, 04/21/18 per pt  . Drug use: Not Currently    Comment: quit 26 yrs ago. Cocaine and marijuana, 04/21/18 yrs ago  . Sexual activity: Not Currently  Other Topics Concern  . Not on file  Social History Narrative   Lives in an apartment. Her oldest granddaughter lives with her.   Social Determinants of Health   Financial Resource Strain: Low Risk   . Difficulty of Paying Living Expenses: Not hard at all  Food Insecurity: Food Insecurity Present  . Worried About Charity fundraiser in the Last Year: Sometimes true  . Ran Out of Food in the Last Year: Sometimes true  Transportation Needs: No Transportation Needs  . Lack of Transportation (Medical): No  . Lack of Transportation (Non-Medical): No  Physical Activity: Insufficiently Active  . Days of Exercise per Week: 3 days  . Minutes of Exercise per Session: 10 min  Stress: Stress Concern Present  . Feeling of Stress : To some extent  Social Connections: Somewhat Isolated  . Frequency of Communication with Friends and Family: Three times a week  . Frequency of Social Gatherings with Friends and Family: Three times a week  . Attends Religious Services: More than 4 times per year  . Active Member of Clubs or  Organizations: No  . Attends Archivist Meetings: Never  . Marital Status: Divorced    Activities  of Daily Living In your present state of health, do you have any difficulty performing the following activities: 02/13/2019 04/21/2018  Hearing? Y N  Comment doesn't hear as good as she used to -  Vision? N N  Comment gets yearly eye exam -  Difficulty concentrating or making decisions? N N  Walking or climbing stairs? Tempie Donning  Comment hurts her to climb stairs, does fairly well on flat surfaces, can't walk long distances due to she gets SOB SOB  Dressing or bathing? N N  Doing errands, shopping? N N  Preparing Food and eating ? N -  Using the Toilet? N -  In the past six months, have you accidently leaked urine? Y -  Comment wears pads all the time -  Do you have problems with loss of bowel control? N -  Managing your Medications? N -  Managing your Finances? N -  Housekeeping or managing your Housekeeping? Y -  Comment her grand daughter that lives with her does help out with this somewhat -  Some recent data might be hidden    Patient Education/ Literacy How often do you need to have someone help you when you read instructions, pamphlets, or other written materials from your doctor or pharmacy?: 1 - Never What is the last grade level you completed in school?: GED  Exercise Current Exercise Habits: Home exercise routine, Type of exercise: stretching, Time (Minutes): 10, Frequency (Times/Week): 3, Weekly Exercise (Minutes/Week): 30, Intensity: Mild, Exercise limited by: orthopedic condition(s);respiratory conditions(s)  Diet Patient reports consuming 2 meals a day and 1 snack(s) a day Patient reports that her primary diet is: Regular Patient reports that she does have regular access to food.   Depression Screen PHQ 2/9 Scores 02/13/2019 10/16/2018 02/24/2018 02/07/2018 01/27/2018 01/24/2018 01/20/2018  PHQ - 2 Score 1 1 2 1 1  0 3  PHQ- 9 Score - - 6 1 - - 7     Fall  Risk Fall Risk  02/13/2019 01/27/2018 01/10/2018 12/20/2017 11/23/2017  Falls in the past year? 0 0 0 No No  Number falls in past yr: - 0 - - -     Objective:  Teresa [name] seemed alert and oriented and she participated appropriately during our telephone visit.  Blood Pressure Weight BMI  BP Readings from Last 3 Encounters:  10/16/18 108/71  09/15/18 138/79  04/28/18 115/63   Wt Readings from Last 3 Encounters:  10/16/18 153 lb 3.2 oz (69.5 kg)  09/15/18 150 lb 3.2 oz (68.1 kg)  04/21/18 143 lb (64.9 kg)   BMI Readings from Last 1 Encounters:  10/16/18 28.02 kg/m    *Unable to obtain current vital signs, weight, and BMI due to telephone visit type  Hearing/Vision  . Emagene did not seem to have difficulty with hearing/understanding during the telephone conversation . Reports that she has had a formal eye exam by an eye care professional within the past year . Reports that she has not had a formal hearing evaluation within the past year *Unable to fully assess hearing and vision during telephone visit type  Cognitive Function: 6CIT Screen 02/13/2019  What Year? 0 points  What month? 0 points  What time? 0 points  Count back from 20 0 points  Months in reverse 0 points  Repeat phrase 2 points  Total Score 2   (Normal:0-7, Significant for Dysfunction: >8)  Normal Cognitive Function Screening: Yes   Immunization & Health Maintenance Record Immunization History  Administered Date(s) Administered  .  Influenza,inj,Quad PF,6+ Mos 12/19/2013, 11/28/2014, 01/27/2016, 12/20/2017, 12/11/2018  . Pneumococcal Polysaccharide-23 06/28/2017  . Tdap 12/07/2011, 01/27/2016  . Zoster 10/17/2014    Health Maintenance  Topic Date Due  . PAP SMEAR-Modifier  05/01/2018  . MAMMOGRAM  01/25/2020  . DEXA SCAN  01/26/2020  . COLONOSCOPY  04/29/2023  . TETANUS/TDAP  01/26/2026  . INFLUENZA VACCINE  Completed  . Hepatitis C Screening  Completed  . HIV Screening  Completed         Assessment  This is a routine wellness examination for Teresa [name].  Health Maintenance: Due or Overdue Health Maintenance Due  Topic Date Due  . PAP SMEAR-Modifier  05/01/2018    Teresa [name] does not need a referral for Community Assistance: Care Management:   no Social Work:    no Prescription Assistance:  no Nutrition/Diabetes Education:  no   Plan:  Personalized Goals Goals Addressed            This Visit's Progress   . DIET - INCREASE WATER INTAKE       Try to drink 6-8 glasses of water daily      Personalized Health Maintenance & Screening Recommendations  She is up to date on all health screening recommendations.  Lung Cancer Screening Recommended: no (Low Dose CT Chest recommended if Age 12-80 years, 30 pack-year currently smoking OR have quit w/in past 15 years) Hepatitis C Screening recommended: no HIV Screening recommended: no  Advanced Directives: Written information was not prepared per patient's request.  Referrals & Orders No orders of the defined types were placed in this encounter.   Follow-up Plan . Follow-up with Dettinger, Fransisca Kaufmann, MD as planned   I have personally reviewed and noted the following in the patient's chart:   . Medical and social history . Use of alcohol, tobacco or illicit drugs  . Current medications and supplements . Functional ability and status . Nutritional status . Physical activity . Advanced directives . List of other physicians . Hospitalizations, surgeries, and ER visits in previous 12 months . Vitals . Screenings to include cognitive, depression, and falls . Referrals and appointments  In addition, I have reviewed and discussed with Teresa [name] certain preventive protocols, quality metrics, and best practice recommendations. A written personalized care plan for preventive services as well as general preventive health recommendations is available and can be mailed to the patient at her request.       Milas Hock, LPN  [id number]

## 2019-02-19 DIAGNOSIS — G5600 Carpal tunnel syndrome, unspecified upper limb: Secondary | ICD-10-CM | POA: Diagnosis not present

## 2019-02-19 DIAGNOSIS — M25569 Pain in unspecified knee: Secondary | ICD-10-CM | POA: Diagnosis not present

## 2019-02-27 ENCOUNTER — Other Ambulatory Visit: Payer: Self-pay | Admitting: Family Medicine

## 2019-03-20 ENCOUNTER — Other Ambulatory Visit: Payer: Self-pay | Admitting: Family Medicine

## 2019-03-21 ENCOUNTER — Encounter: Payer: Self-pay | Admitting: Family Medicine

## 2019-03-21 ENCOUNTER — Other Ambulatory Visit: Payer: Self-pay

## 2019-03-21 ENCOUNTER — Ambulatory Visit (INDEPENDENT_AMBULATORY_CARE_PROVIDER_SITE_OTHER): Payer: Medicare Other | Admitting: Family Medicine

## 2019-03-21 DIAGNOSIS — K5792 Diverticulitis of intestine, part unspecified, without perforation or abscess without bleeding: Secondary | ICD-10-CM

## 2019-03-21 MED ORDER — METRONIDAZOLE 500 MG PO TABS: 500.0000 mg | ORAL_TABLET | Freq: Two times a day (BID) | ORAL | 0 refills | Status: AC

## 2019-03-21 MED ORDER — POLYETHYLENE GLYCOL 3350 17 GM/SCOOP PO POWD: 17.0000 g | Freq: Every day | ORAL | 1 refills | Status: DC | PRN

## 2019-03-21 MED ORDER — CIPROFLOXACIN HCL 500 MG PO TABS: 500.0000 mg | ORAL_TABLET | Freq: Two times a day (BID) | ORAL | 0 refills | Status: AC

## 2019-03-21 NOTE — Progress Notes (Signed)
Virtual Visit via Telephone Note  I connected with Teresa [name] on 03/21/19 at 2:06 PM by telephone and verified that I am speaking with the correct person using two identifiers. Teresa [name] is currently located at her neighbors house and nobody is currently with her during this visit. The provider, Loman Brooklyn, FNP is located in their home at time of visit.  I discussed the limitations, risks, security and privacy concerns of performing an evaluation and management service by telephone and the availability of in person appointments. I also discussed with the patient that there may be a patient responsible charge related to this service. The patient expressed understanding and agreed to proceed.  Subjective: PCP: Dettinger, Fransisca Kaufmann, MD  Chief Complaint  Patient presents with  . Diverticulitis   Patient c/o left lower quadrant pain consistent with where she typically experiences pain when she is having a flare of diverticulitis.  She is also experiencing diarrhea, nausea, and vomiting (yesterday).  Her symptoms began 3 days ago.  She does have a gastroenterologist that follows her for a history of hepatitis C.   ROS: Per HPI  Current Outpatient Medications:  .  albuterol (PROVENTIL) (2.5 MG/3ML) 0.083% nebulizer solution, NEBULIZE 1 VIAL EVERY 6 HOURS AS NEEDED FOR SHORTNESS OF BREATH, Disp: 150 mL, Rfl: 1 .  albuterol (VENTOLIN HFA) 108 (90 Base) MCG/ACT inhaler, USE 1 TO 2 PUFFS EVERY 4 TO 6 HOURS AS NEEDED, Disp: 25.5 g, Rfl: 4 .  buprenorphine (BUTRANS - DOSED MCG/HR) 10 MCG/HR PTWK patch, Place 10 mcg onto the skin once a week. , Disp: , Rfl:  .  buPROPion (WELLBUTRIN XL) 300 MG 24 hr tablet, TAKE ONE (1) TABLET EACH DAY, Disp: 90 tablet, Rfl: 1 .  cetirizine (ZYRTEC) 10 MG tablet, Take 1 tablet (10 mg total) by mouth every morning., Disp: 90 tablet, Rfl: 3 .  ciprofloxacin (CIPRO) 500 MG tablet, Take 1 tablet (500 mg total) by mouth 2 (two) times daily for 7 days., Disp: 14  tablet, Rfl: 0 .  citalopram (CELEXA) 20 MG tablet, TAKE ONE (1) TABLET EACH DAY, Disp: 90 tablet, Rfl: 2 .  cyclobenzaprine (FLEXERIL) 10 MG tablet, Take 10 mg by mouth 3 (three) times daily as needed for muscle spasms. , Disp: , Rfl:  .  DEXILANT 60 MG capsule, TAKE ONE (1) CAPSULE EACH DAY, Disp: 90 capsule, Rfl: 1 .  diazepam (VALIUM) 10 MG tablet, Take 5 mg by mouth every 12 (twelve) hours as needed for anxiety. , Disp: , Rfl:  .  diclofenac sodium (VOLTAREN) 1 % GEL, Apply 1 application topically 2 (two) times daily as needed (pain). , Disp: , Rfl:  .  docusate sodium (COLACE) 100 MG capsule, Take 100-200 mg by mouth daily as needed for mild constipation. , Disp: , Rfl:  .  lisinopril (ZESTRIL) 10 MG tablet, TAKE ONE (1) TABLET EACH DAY, Disp: 90 tablet, Rfl: 0 .  metroNIDAZOLE (FLAGYL) 500 MG tablet, Take 1 tablet (500 mg total) by mouth 2 (two) times daily for 7 days., Disp: 14 tablet, Rfl: 0 .  mometasone (NASONEX) 50 MCG/ACT nasal spray, USE 2 SPRAYS IN EACH NOSTRIL ONCE DAILY, Disp: 51 g, Rfl: 2 .  montelukast (SINGULAIR) 10 MG tablet, TAKE ONE (1) TABLET EACH DAY, Disp: 90 tablet, Rfl: 0 .  Olopatadine HCl 0.2 % SOLN, Place 1 drop into both eyes daily as needed for irritation., Disp: , Rfl:  .  ondansetron (ZOFRAN) 4 MG tablet, Take 4 mg  by mouth every 8 (eight) hours as needed for nausea or vomiting. , Disp: , Rfl:  .  oxymorphone (OPANA) 5 MG tablet, Take 5 mg by mouth 2 (two) times daily. , Disp: , Rfl:  .  polyethylene glycol powder (GLYCOLAX/MIRALAX) 17 GM/SCOOP powder, Take 17 g by mouth daily as needed., Disp: 850 g, Rfl: 1 .  SYMBICORT 160-4.5 MCG/ACT inhaler, USE 2 PUFFS TWICE DAILY, Disp: 10.2 g, Rfl: 2 .  triamcinolone ointment (KENALOG) 0.5 %, Apply 1 application topically 2 (two) times daily., Disp: 30 g, Rfl: 0 .  ZTLIDO 1.8 % PTCH, Place 1 patch onto the skin 2 (two) times daily. , Disp: , Rfl:   Allergies  Allergen Reactions  . Lyrica [Pregabalin] Shortness Of Breath    . Trazodone And Nefazodone Shortness Of Breath  . Amitriptyline   . Arthrotec [Diclofenac-Misoprostol] Other (See Comments)    Upset stomach  . Keflex [Cephalexin] Other (See Comments)    Unknown reaction  . Naproxen Other (See Comments)    Irritates stomach  . Prozac [Fluoxetine Hcl] Other (See Comments)    "makes me feel bad"  . Remeron [Mirtazapine] Other (See Comments)    "off balance"  . Zolpidem Other (See Comments)    Other  . Ambien [Zolpidem Tartrate] Other (See Comments)    COULDN'T FUNCTION, STAYED SLEEPY   Past Medical History:  Diagnosis Date  . Anxiety state, unspecified   . Asthma    prn inhaler  . Chronic back pain greater than 3 months duration   . Colon polyp   . Degeneration of intervertebral disc, site unspecified   . Depressive disorder, not elsewhere classified   . Diverticulitis   . Diverticulosis 07-08-2005   colonoscopy  . Esophageal stricture 2014  . Full dentures   . GERD (gastroesophageal reflux disease) 07-08-2005   EGD  . Hepatitis C   . Hiatal hernia 07-08-2005   EGD  . Hypertension   . Insomnia   . Irritable bowel syndrome   . Migraine   . Muscle spasm   . Osteoarthrosis, unspecified whether generalized or localized, lower leg   . Papilloma of breast 09/2011   left  . Seasonal allergies   . Seizures ([place]) 1990s   x 1, unknown cause; no seizures since; no meds, unknown etiology  . Vocal cord polyp    history of    Observations/Objective: A&O  No respiratory distress or wheezing audible over the phone Mood, judgement, and thought processes all WNL  Assessment and Plan: 1. Diverticulitis - Encouraged patient to schedule appointment with GI due to this being her 3rd flare of diverticulitis in the past 7 months.  - ciprofloxacin (CIPRO) 500 MG tablet; Take 1 tablet (500 mg total) by mouth 2 (two) times daily for 7 days.  Dispense: 14 tablet; Refill: 0 - metroNIDAZOLE (FLAGYL) 500 MG tablet; Take 1 tablet (500 mg total) by mouth 2  (two) times daily for 7 days.  Dispense: 14 tablet; Refill: 0   Follow Up Instructions:  I discussed the assessment and treatment plan with the patient. The patient was provided an opportunity to ask questions and all were answered. The patient agreed with the plan and demonstrated an understanding of the instructions.   The patient was advised to call back or seek an in-person evaluation if the symptoms worsen or if the condition fails to improve as anticipated.  The above assessment and management plan was discussed with the patient. The patient verbalized understanding of and has agreed  to the management plan. Patient is aware to call the clinic if symptoms persist or worsen. Patient is aware when to return to the clinic for a follow-up visit. Patient educated on when it is appropriate to go to the emergency department.   Time call ended: 2:17 PM  I provided 13 minutes of non-face-to-face time during this encounter.  Hendricks Limes, MSN, APRN, FNP-C [facility] 03/21/19

## 2019-03-26 ENCOUNTER — Other Ambulatory Visit (INDEPENDENT_AMBULATORY_CARE_PROVIDER_SITE_OTHER): Payer: Self-pay | Admitting: *Deleted

## 2019-03-26 DIAGNOSIS — Z86010 Personal history of colonic polyps: Secondary | ICD-10-CM

## 2019-04-09 ENCOUNTER — Telehealth: Payer: Self-pay | Admitting: Family Medicine

## 2019-04-09 NOTE — Telephone Encounter (Signed)
What is the name of the medication? Antibiotic for Diverticulitis  Have you contacted your pharmacy to request a refill?  Yes  Which pharmacy would you like this sent to?  [facility]  Pt says Dr Building control surveyor recently prescribed her an antibiotic for Diverticulitis and pt wants to know if he can send in a refill because pt recently ate something that pt didn't know she shouldn't have ate and now she is having stomach pains again.   Patient notified that their request is being sent to the clinical staff for review and that they should receive a call once it is complete. If they do not receive a call within 24 hours they can check with their pharmacy or our office.

## 2019-04-09 NOTE — Telephone Encounter (Signed)
Please schedule patient an appointment, can be virtual if she has symptoms that she cannot come in for.

## 2019-04-10 ENCOUNTER — Encounter: Payer: Self-pay | Admitting: Family

## 2019-04-10 ENCOUNTER — Ambulatory Visit (INDEPENDENT_AMBULATORY_CARE_PROVIDER_SITE_OTHER): Payer: Medicare Other | Admitting: Family

## 2019-04-10 DIAGNOSIS — K5792 Diverticulitis of intestine, part unspecified, without perforation or abscess without bleeding: Secondary | ICD-10-CM | POA: Diagnosis not present

## 2019-04-10 MED ORDER — METRONIDAZOLE 500 MG PO TABS: 500.0000 mg | ORAL_TABLET | Freq: Three times a day (TID) | ORAL | 0 refills | Status: DC

## 2019-04-10 MED ORDER — CIPROFLOXACIN HCL 500 MG PO TABS: 500.0000 mg | ORAL_TABLET | Freq: Two times a day (BID) | ORAL | 0 refills | Status: DC

## 2019-04-10 NOTE — Progress Notes (Signed)
   Virtual Visit via telephone Note Due to COVID-19 pandemic this visit was conducted virtually. This visit type was conducted due to national recommendations for restrictions regarding the COVID-19 Pandemic (e.g. social distancing, sheltering in place) in an effort to limit this patient's exposure and mitigate transmission in our community. All issues noted in this document were discussed and addressed.  A physical exam was not performed with this format.  I connected with [name] on 04/10/19 at 10:18 AM by telephone and verified that I am speaking with the correct person using two identifiers. Teresa [name] is currently located at friends home and no one is currently with her during visit. The provider, Evelina Dun, FNP is located in their office at time of visit.  I discussed the limitations, risks, security and privacy concerns of performing an evaluation and management service by telephone and the availability of in person appointments. I also discussed with the patient that there may be a patient responsible charge related to this service. The patient expressed understanding and agreed to proceed.   History and Present Illness:  Abdominal Pain This is a recurrent problem. The current episode started in the past 7 days. The onset quality is gradual. The problem occurs constantly. The problem has been gradually worsening. The pain is located in the LLQ. The pain is at a severity of 6/10. The pain is moderate. The quality of the pain is cramping and aching. The abdominal pain does not radiate. Associated symptoms include diarrhea. Pertinent negatives include no belching, constipation, nausea or vomiting. The pain is aggravated by certain positions, eating and palpation. Treatments tried: oxycodone  The treatment provided mild relief. diverticulitis       Review of Systems  Gastrointestinal: Positive for abdominal pain and diarrhea. Negative for constipation, nausea and vomiting.  All  other systems reviewed and are negative.    Observations/Objective: No SOB or distress noted  Assessment and Plan: 1. Diverticulitis Full liquid diet Rest Will start flagyl and cipro today, if pain worsen call office or go to ED - metroNIDAZOLE (FLAGYL) 500 MG tablet; Take 1 tablet (500 mg total) by mouth 3 (three) times daily.  Dispense: 21 tablet; Refill: 0 - ciprofloxacin (CIPRO) 500 MG tablet; Take 1 tablet (500 mg total) by mouth 2 (two) times daily.  Dispense: 14 tablet; Refill: 0   Follow Up Instructions: As needed    I discussed the assessment and treatment plan with the patient. The patient was provided an opportunity to ask questions and all were answered. The patient agreed with the plan and demonstrated an understanding of the instructions.   The patient was advised to call back or seek an in-person evaluation if the symptoms worsen or if the condition fails to improve as anticipated.  The above assessment and management plan was discussed with the patient. The patient verbalized understanding of and has agreed to the management plan. Patient is aware to call the clinic if symptoms persist or worsen. Patient is aware when to return to the clinic for a follow-up visit. Patient educated on when it is appropriate to go to the emergency department.   Time call ended:  10:34 AM   I provided 16 minutes of non-face-to-face time during this encounter.    Evelina Dun, FNP

## 2019-04-10 NOTE — Telephone Encounter (Signed)
Tele-visit appointment scheduled this morning with Teresa [name] at 10:25 am.  Patient aware.

## 2019-04-12 DIAGNOSIS — Z86010 Personal history of colonic polyps: Secondary | ICD-10-CM | POA: Diagnosis not present

## 2019-04-13 LAB — HEPATIC FUNCTION PANEL
AG Ratio: 1.5 (calc) (ref 1.0–2.5)
ALT: 42 U/L — ABNORMAL HIGH (ref 6–29)
AST: 42 U/L — ABNORMAL HIGH (ref 10–35)
Albumin: 3.9 g/dL (ref 3.6–5.1)
Alkaline phosphatase (APISO): 85 U/L (ref 37–153)
Bilirubin, Direct: 0.3 mg/dL — ABNORMAL HIGH (ref 0.0–0.2)
Globulin: 2.6 g/dL (calc) (ref 1.9–3.7)
Indirect Bilirubin: 0.8 mg/dL (calc) (ref 0.2–1.2)
Total Bilirubin: 1.1 mg/dL (ref 0.2–1.2)
Total Protein: 6.5 g/dL (ref 6.1–8.1)

## 2019-04-14 ENCOUNTER — Other Ambulatory Visit (INDEPENDENT_AMBULATORY_CARE_PROVIDER_SITE_OTHER): Payer: Self-pay | Admitting: Internal Medicine

## 2019-04-14 DIAGNOSIS — R7989 Other specified abnormal findings of blood chemistry: Secondary | ICD-10-CM

## 2019-04-14 DIAGNOSIS — R945 Abnormal results of liver function studies: Secondary | ICD-10-CM

## 2019-04-16 ENCOUNTER — Other Ambulatory Visit (INDEPENDENT_AMBULATORY_CARE_PROVIDER_SITE_OTHER): Payer: Self-pay | Admitting: *Deleted

## 2019-04-16 DIAGNOSIS — B182 Chronic viral hepatitis C: Secondary | ICD-10-CM

## 2019-04-16 DIAGNOSIS — G5600 Carpal tunnel syndrome, unspecified upper limb: Secondary | ICD-10-CM | POA: Diagnosis not present

## 2019-04-16 DIAGNOSIS — M25569 Pain in unspecified knee: Secondary | ICD-10-CM | POA: Diagnosis not present

## 2019-04-19 ENCOUNTER — Other Ambulatory Visit: Payer: Self-pay

## 2019-04-19 ENCOUNTER — Ambulatory Visit (INDEPENDENT_AMBULATORY_CARE_PROVIDER_SITE_OTHER): Payer: Medicare Other | Admitting: Family Medicine

## 2019-04-19 ENCOUNTER — Encounter: Payer: Self-pay | Admitting: Family Medicine

## 2019-04-19 DIAGNOSIS — F32A Depression, unspecified: Secondary | ICD-10-CM

## 2019-04-19 DIAGNOSIS — I10 Essential (primary) hypertension: Secondary | ICD-10-CM | POA: Diagnosis not present

## 2019-04-19 DIAGNOSIS — K219 Gastro-esophageal reflux disease without esophagitis: Secondary | ICD-10-CM

## 2019-04-19 DIAGNOSIS — F329 Major depressive disorder, single episode, unspecified: Secondary | ICD-10-CM

## 2019-04-19 DIAGNOSIS — R7303 Prediabetes: Secondary | ICD-10-CM

## 2019-04-19 DIAGNOSIS — F331 Major depressive disorder, recurrent, moderate: Secondary | ICD-10-CM

## 2019-04-19 DIAGNOSIS — F411 Generalized anxiety disorder: Secondary | ICD-10-CM

## 2019-04-19 LAB — HEPATITIS C RNA QUANTITATIVE
HCV Quantitative Log: <1.18 Log IU/mL
HCV RNA, PCR, QN: <15 IU/mL

## 2019-04-19 MED ORDER — LISINOPRIL 10 MG PO TABS: 10.0000 mg | ORAL_TABLET | Freq: Every day | ORAL | 3 refills | Status: DC

## 2019-04-19 MED ORDER — BUPROPION HCL ER (XL) 300 MG PO TB24: ORAL_TABLET | ORAL | 1 refills | Status: DC

## 2019-04-19 MED ORDER — DEXILANT 60 MG PO CPDR: 60.0000 mg | DELAYED_RELEASE_CAPSULE | Freq: Every day | ORAL | 3 refills | Status: AC

## 2019-04-19 MED ORDER — MONTELUKAST SODIUM 10 MG PO TABS: 10.0000 mg | ORAL_TABLET | Freq: Every day | ORAL | 3 refills | Status: AC

## 2019-04-19 MED ORDER — POLYETHYLENE GLYCOL 3350 17 GM/SCOOP PO POWD: 17.0000 g | Freq: Every day | ORAL | 1 refills | Status: DC | PRN

## 2019-04-19 NOTE — Progress Notes (Signed)
Virtual Visit via telephone Note  I connected with Teresa [name] on 04/19/19 at 1024 by telephone and verified that I am speaking with the correct person using two identifiers. Teresa [name] is currently located at home and no other people are currently with her during visit. The provider, Fransisca Kaufmann Diya Gervasi, MD is located in their office at time of visit.  Call ended at 1034  I discussed the limitations, risks, security and privacy concerns of performing an evaluation and management service by telephone and the availability of in person appointments. I also discussed with the patient that there may be a patient responsible charge related to this service. The patient expressed understanding and agreed to proceed.   History and Present Illness: Depression and anxiety Patient currently takes wellbutrin and citalopram.  Patient denies any issues with depression or anxiety and is sleeping well at night.  Patient denies any suicidal ideations.   Hypertension Patient is currently on lisinopril, and their blood pressure today is unknown. Patient denies any lightheadedness or dizziness. Patient denies headaches, blurred vision, chest pains, shortness of breath, or weakness. Denies any side effects from medication and is content with current medication.   Hyperlipidemia Patient is coming in for recheck of his hyperlipidemia. The patient is currently taking no medication. They deny any issues with myalgias or history of liver damage from it. They deny any focal numbness or weakness or chest pain.   Prediabetes Patient comes in today for recheck of his diabetes. Patient has been currently taking no medication and is diet controlled. Patient is currently on an ACE inhibitor/ARB. Patient has not seen an ophthalmologist this year. Patient  denie any issues with their feet.   1. Essential hypertension   2. Prediabetes   3. Gastroesophageal reflux disease without esophagitis   4. GAD (generalized  anxiety disorder)   5. Depression, unspecified depression type   6. Major depressive disorder, recurrent episode, moderate (HCC)     Outpatient Encounter Medications as of 04/19/2019  Medication Sig  . albuterol (PROVENTIL) (2.5 MG/3ML) 0.083% nebulizer solution NEBULIZE 1 VIAL EVERY 6 HOURS AS NEEDED FOR SHORTNESS OF BREATH  . albuterol (VENTOLIN HFA) 108 (90 Base) MCG/ACT inhaler USE 1 TO 2 PUFFS EVERY 4 TO 6 HOURS AS NEEDED  . buPROPion (WELLBUTRIN XL) 300 MG 24 hr tablet TAKE ONE (1) TABLET EACH DAY  . cetirizine (ZYRTEC) 10 MG tablet Take 1 tablet (10 mg total) by mouth every morning.  . citalopram (CELEXA) 20 MG tablet TAKE ONE (1) TABLET EACH DAY  . cyclobenzaprine (FLEXERIL) 10 MG tablet Take 10 mg by mouth 3 (three) times daily as needed for muscle spasms.   Marland Kitchen dexlansoprazole (DEXILANT) 60 MG capsule Take 1 capsule (60 mg total) by mouth daily.  . diazepam (VALIUM) 10 MG tablet Take 5 mg by mouth every 12 (twelve) hours as needed for anxiety.   . diclofenac sodium (VOLTAREN) 1 % GEL Apply 1 application topically 2 (two) times daily as needed (pain).   Marland Kitchen docusate sodium (COLACE) 100 MG capsule Take 100-200 mg by mouth daily as needed for mild constipation.   Marland Kitchen lisinopril (ZESTRIL) 10 MG tablet Take 1 tablet (10 mg total) by mouth daily.  . mometasone (NASONEX) 50 MCG/ACT nasal spray USE 2 SPRAYS IN EACH NOSTRIL ONCE DAILY  . montelukast (SINGULAIR) 10 MG tablet Take 1 tablet (10 mg total) by mouth at bedtime.  . ondansetron (ZOFRAN) 4 MG tablet Take 4 mg by mouth every 8 (eight) hours as  needed for nausea or vomiting.   Marland Kitchen oxymorphone (OPANA) 5 MG tablet Take 5 mg by mouth 2 (two) times daily.   . polyethylene glycol powder (GLYCOLAX/MIRALAX) 17 GM/SCOOP powder Take 17 g by mouth daily as needed.  . SYMBICORT 160-4.5 MCG/ACT inhaler USE 2 PUFFS TWICE DAILY  . [DISCONTINUED] buPROPion (WELLBUTRIN XL) 300 MG 24 hr tablet TAKE ONE (1) TABLET EACH DAY  . [DISCONTINUED] ciprofloxacin  (CIPRO) 500 MG tablet Take 1 tablet (500 mg total) by mouth 2 (two) times daily.  . [DISCONTINUED] DEXILANT 60 MG capsule TAKE ONE (1) CAPSULE EACH DAY  . [DISCONTINUED] lisinopril (ZESTRIL) 10 MG tablet TAKE ONE (1) TABLET EACH DAY  . [DISCONTINUED] metroNIDAZOLE (FLAGYL) 500 MG tablet Take 1 tablet (500 mg total) by mouth 3 (three) times daily.  . [DISCONTINUED] montelukast (SINGULAIR) 10 MG tablet TAKE ONE (1) TABLET EACH DAY  . [DISCONTINUED] polyethylene glycol powder (GLYCOLAX/MIRALAX) 17 GM/SCOOP powder Take 17 g by mouth daily as needed.   No facility-administered encounter medications on file as of 04/19/2019.    Review of Systems  Constitutional: Negative for chills and fever.  Eyes: Negative for visual disturbance.  Respiratory: Negative for chest tightness and shortness of breath.   Cardiovascular: Negative for chest pain and leg swelling.  Musculoskeletal: Negative for back pain and gait problem.  Skin: Negative for rash.  Neurological: Negative for light-headedness and headaches.  Psychiatric/Behavioral: Negative for agitation, behavioral problems, dysphoric mood, self-injury, sleep disturbance and suicidal ideas. The patient is not nervous/anxious.   All other systems reviewed and are negative.   Observations/Objective: Patient sounds comfortable and in no acute distress  Assessment and Plan: Problem List Items Addressed This Visit      Cardiovascular and Mediastinum   HTN (hypertension) - Primary   Relevant Medications   lisinopril (ZESTRIL) 10 MG tablet     Digestive   Gastroesophageal reflux disease without esophagitis   Relevant Medications   polyethylene glycol powder (GLYCOLAX/MIRALAX) 17 GM/SCOOP powder   dexlansoprazole (DEXILANT) 60 MG capsule     Other   GAD (generalized anxiety disorder)   Relevant Medications   buPROPion (WELLBUTRIN XL) 300 MG 24 hr tablet   Depression   Relevant Medications   buPROPion (WELLBUTRIN XL) 300 MG 24 hr tablet    Prediabetes   Major depressive disorder, recurrent episode, moderate (HCC)   Relevant Medications   buPROPion (WELLBUTRIN XL) 300 MG 24 hr tablet      Continue current medication, no major changes today I will have her come in sooner than normal and like 3 months to do some blood work and a blood pressure check she will call the office for an appointment Follow up plan: Return in about 3 months (around 07/17/2019), or if symptoms worsen or fail to improve, for prediaetes and htn.     I discussed the assessment and treatment plan with the patient. The patient was provided an opportunity to ask questions and all were answered. The patient agreed with the plan and demonstrated an understanding of the instructions.   The patient was advised to call back or seek an in-person evaluation if the symptoms worsen or if the condition fails to improve as anticipated.  The above assessment and management plan was discussed with the patient. The patient verbalized understanding of and has agreed to the management plan. Patient is aware to call the clinic if symptoms persist or worsen. Patient is aware when to return to the clinic for a follow-up visit. Patient educated on when  it is appropriate to go to the emergency department.    I provided 10 minutes of non-face-to-face time during this encounter.    Worthy Rancher, MD

## 2019-04-20 ENCOUNTER — Other Ambulatory Visit (INDEPENDENT_AMBULATORY_CARE_PROVIDER_SITE_OTHER): Payer: Self-pay | Admitting: *Deleted

## 2019-04-20 DIAGNOSIS — B182 Chronic viral hepatitis C: Secondary | ICD-10-CM

## 2019-04-20 DIAGNOSIS — R7989 Other specified abnormal findings of blood chemistry: Secondary | ICD-10-CM

## 2019-04-20 DIAGNOSIS — R945 Abnormal results of liver function studies: Secondary | ICD-10-CM

## 2019-04-24 ENCOUNTER — Telehealth: Payer: Self-pay | Admitting: Family Medicine

## 2019-04-24 NOTE — Chronic Care Management (AMB) (Signed)
  Chronic Care Management   Note  04/24/2019 Name: LATREECE MOCHIZUKI MRN: [id number] DOB: 05-22-1955  ASENATH BALASH is a 64 y.o. year old female who is a primary care patient of Dettinger, Fransisca Kaufmann, MD. I reached out to [name] by phone today in response to a referral sent by Ms. Deberah Castle Lanz's health plan.     Ms. Ennen was given information about Chronic Care Management services today including:  1. CCM service includes personalized support from designated clinical staff supervised by her physician, including individualized plan of care and coordination with other care providers 2. 24/7 contact phone numbers for assistance for urgent and routine care needs. 3. Service will only be billed when office clinical staff spend 20 minutes or more in a month to coordinate care. 4. Only one practitioner may furnish and bill the service in a calendar month. 5. The patient may stop CCM services at any time (effective at the end of the month) by phone call to the office staff. 6. The patient will be responsible for cost sharing (co-pay) of up to 20% of the service fee (after annual deductible is met).  Patient agreed to services and verbal consent obtained.   Follow up plan: Telephone appointment with care management team member scheduled for:06/01/2019  Noreene Larsson, [place], [place], [place] [address] Direct Dial: [phone] [email or web address] Website: [email or web address]

## 2019-04-25 ENCOUNTER — Other Ambulatory Visit (INDEPENDENT_AMBULATORY_CARE_PROVIDER_SITE_OTHER): Payer: Self-pay | Admitting: *Deleted

## 2019-04-25 DIAGNOSIS — R945 Abnormal results of liver function studies: Secondary | ICD-10-CM

## 2019-04-25 DIAGNOSIS — R7989 Other specified abnormal findings of blood chemistry: Secondary | ICD-10-CM

## 2019-04-25 DIAGNOSIS — B182 Chronic viral hepatitis C: Secondary | ICD-10-CM

## 2019-05-15 ENCOUNTER — Ambulatory Visit (HOSPITAL_COMMUNITY)
Admission: RE | Admit: 2019-05-15 | Discharge: 2019-05-15 | Disposition: A | Discharge status: Home / Self Care | Payer: Medicare Other | Source: Ambulatory Visit | Attending: Internal Medicine | Admitting: Internal Medicine

## 2019-05-15 ENCOUNTER — Other Ambulatory Visit: Payer: Self-pay

## 2019-05-15 DIAGNOSIS — K7689 Other specified diseases of liver: Secondary | ICD-10-CM | POA: Diagnosis not present

## 2019-05-15 DIAGNOSIS — R7989 Other specified abnormal findings of blood chemistry: Secondary | ICD-10-CM | POA: Diagnosis not present

## 2019-05-15 DIAGNOSIS — B182 Chronic viral hepatitis C: Secondary | ICD-10-CM | POA: Diagnosis present

## 2019-05-15 DIAGNOSIS — R945 Abnormal results of liver function studies: Secondary | ICD-10-CM | POA: Insufficient documentation

## 2019-05-18 DIAGNOSIS — R945 Abnormal results of liver function studies: Secondary | ICD-10-CM | POA: Diagnosis not present

## 2019-05-19 LAB — HEPATIC FUNCTION PANEL
AG Ratio: 1.6 (calc) (ref 1.0–2.5)
ALT: 34 U/L — ABNORMAL HIGH (ref 6–29)
AST: 25 U/L (ref 10–35)
Albumin: 4.2 g/dL (ref 3.6–5.1)
Alkaline phosphatase (APISO): 100 U/L (ref 37–153)
Bilirubin, Direct: 0.2 mg/dL (ref 0.0–0.2)
Globulin: 2.6 g/dL (calc) (ref 1.9–3.7)
Indirect Bilirubin: 0.6 mg/dL (calc) (ref 0.2–1.2)
Total Bilirubin: 0.8 mg/dL (ref 0.2–1.2)
Total Protein: 6.8 g/dL (ref 6.1–8.1)

## 2019-05-23 ENCOUNTER — Other Ambulatory Visit (INDEPENDENT_AMBULATORY_CARE_PROVIDER_SITE_OTHER): Payer: Self-pay | Admitting: *Deleted

## 2019-05-23 DIAGNOSIS — R945 Abnormal results of liver function studies: Secondary | ICD-10-CM

## 2019-05-23 DIAGNOSIS — R7989 Other specified abnormal findings of blood chemistry: Secondary | ICD-10-CM

## 2019-06-01 ENCOUNTER — Telehealth: Payer: Medicare Other

## 2019-06-04 NOTE — Progress Notes (Signed)
Patient's next appointment is in July 2021. She needs to come earlier because of recurrent diverticulitis

## 2019-06-11 DIAGNOSIS — Z79891 Long term (current) use of opiate analgesic: Secondary | ICD-10-CM | POA: Diagnosis not present

## 2019-06-11 DIAGNOSIS — M542 Cervicalgia: Secondary | ICD-10-CM | POA: Diagnosis not present

## 2019-06-11 DIAGNOSIS — M545 Low back pain: Secondary | ICD-10-CM | POA: Diagnosis not present

## 2019-06-11 DIAGNOSIS — G5600 Carpal tunnel syndrome, unspecified upper limb: Secondary | ICD-10-CM | POA: Diagnosis not present

## 2019-06-11 DIAGNOSIS — M25569 Pain in unspecified knee: Secondary | ICD-10-CM | POA: Diagnosis not present

## 2019-06-14 ENCOUNTER — Ambulatory Visit: Payer: Medicare Other | Admitting: *Deleted

## 2019-06-14 DIAGNOSIS — F329 Major depressive disorder, single episode, unspecified: Secondary | ICD-10-CM

## 2019-06-14 DIAGNOSIS — I10 Essential (primary) hypertension: Secondary | ICD-10-CM

## 2019-06-14 DIAGNOSIS — F411 Generalized anxiety disorder: Secondary | ICD-10-CM

## 2019-06-14 DIAGNOSIS — F32A Depression, unspecified: Secondary | ICD-10-CM

## 2019-06-15 NOTE — Chronic Care Management (AMB) (Signed)
  Chronic Care Management   Initial Visit Outreach Note  06/15/2019 Name: Teresa [name] MRN: [id number] DOB: [birth year]  Referred by: Dettinger, Fransisca Kaufmann, MD Reason for referral : Chronic Care Management (Initial Visit)   I reached out to [name] for her Initial Telephone Visit but she was unable to talk at that time and asked that I call back another day. The patient was referred to the case management team for assistance with care management and care coordination.   RN Care Plan   . Chronic Disease Management Needs       CARE PLAN ENTRY (see longtitudinal plan of care for additional care plan information)  Current Barriers:  . Chronic Disease Management support, education, and care coordination needs related to HTN, asthma, osteoarthritis, GAD, depression  Clinical Goals: . Over the next 30 days, patient will have an Initial CCM Visit with a member of the embedded CCM team to discuss self-management of their chronic medical conditions  Interventions: . Chart reviewed in preparation for initial visit telephone call . Collaboration with other care team members as needed . Reached out to patient but she wasn't able to talk at that time and asked that I call back another day  Patient Self Care Activities: . Undetermined   Initial goal documentation        Follow Up Plan: The care management team will reach out to the patient again over the next 30 days.   Chong Sicilian, BSN, RN-BC Embedded Chronic Care Manager [facility] [place] [facility] Direct Dial: [phone]

## 2019-07-12 DIAGNOSIS — H5213 Myopia, bilateral: Secondary | ICD-10-CM | POA: Diagnosis not present

## 2019-07-18 ENCOUNTER — Other Ambulatory Visit (INDEPENDENT_AMBULATORY_CARE_PROVIDER_SITE_OTHER): Payer: Self-pay | Admitting: *Deleted

## 2019-07-18 DIAGNOSIS — R7989 Other specified abnormal findings of blood chemistry: Secondary | ICD-10-CM

## 2019-07-23 DIAGNOSIS — R945 Abnormal results of liver function studies: Secondary | ICD-10-CM | POA: Diagnosis not present

## 2019-07-24 ENCOUNTER — Other Ambulatory Visit: Payer: Self-pay

## 2019-07-24 ENCOUNTER — Ambulatory Visit (INDEPENDENT_AMBULATORY_CARE_PROVIDER_SITE_OTHER): Payer: Medicare Other | Admitting: Internal Medicine

## 2019-07-24 ENCOUNTER — Other Ambulatory Visit (INDEPENDENT_AMBULATORY_CARE_PROVIDER_SITE_OTHER): Payer: Self-pay | Admitting: *Deleted

## 2019-07-24 ENCOUNTER — Encounter (INDEPENDENT_AMBULATORY_CARE_PROVIDER_SITE_OTHER): Payer: Self-pay | Admitting: Internal Medicine

## 2019-07-24 ENCOUNTER — Encounter (INDEPENDENT_AMBULATORY_CARE_PROVIDER_SITE_OTHER): Payer: Self-pay | Admitting: *Deleted

## 2019-07-24 VITALS — BP 115/70 | HR 85 | Temp 97.2°F | Ht 62.0 in | Wt 153.3 lb

## 2019-07-24 DIAGNOSIS — R945 Abnormal results of liver function studies: Secondary | ICD-10-CM | POA: Diagnosis not present

## 2019-07-24 DIAGNOSIS — R1319 Other dysphagia: Secondary | ICD-10-CM

## 2019-07-24 DIAGNOSIS — R7989 Other specified abnormal findings of blood chemistry: Secondary | ICD-10-CM

## 2019-07-24 DIAGNOSIS — R1011 Right upper quadrant pain: Secondary | ICD-10-CM | POA: Diagnosis not present

## 2019-07-24 DIAGNOSIS — R1310 Dysphagia, unspecified: Secondary | ICD-10-CM

## 2019-07-24 LAB — HEPATIC FUNCTION PANEL
AG Ratio: 1.8 (calc) (ref 1.0–2.5)
ALT: 34 U/L — ABNORMAL HIGH (ref 6–29)
AST: 44 U/L — ABNORMAL HIGH (ref 10–35)
Albumin: 4.2 g/dL (ref 3.6–5.1)
Alkaline phosphatase (APISO): 84 U/L (ref 37–153)
Bilirubin, Direct: 0.2 mg/dL (ref 0.0–0.2)
Globulin: 2.4 g/dL (calc) (ref 1.9–3.7)
Indirect Bilirubin: 0.4 mg/dL (calc) (ref 0.2–1.2)
Total Bilirubin: 0.6 mg/dL (ref 0.2–1.2)
Total Protein: 6.6 g/dL (ref 6.1–8.1)

## 2019-07-24 MED ORDER — DICYCLOMINE HCL 10 MG PO CAPS: 10.0000 mg | ORAL_CAPSULE | Freq: Three times a day (TID) | ORAL | 0 refills | Status: DC | PRN

## 2019-07-24 NOTE — Patient Instructions (Signed)
Esophagogastroduodenoscopy and esophageal dilation to be scheduled. Blood work to be repeated in 3 months. Use dicyclomine only on as-needed basis when the right upper quadrant abdominal pain is significant.

## 2019-07-24 NOTE — Progress Notes (Signed)
Presenting complaint;  Dysphagia and right upper quadrant abdominal pain. Abnormal LFTs.  Database and subjective:  Patient is 64 year old Caucasian female who has history of chronic GERD History of esophageal stricture(EGD ED by Dr. Henrene Pastor August 2014), history of chronic hepatitis C status post therapy with Harvoni resulting in SVR in 2017 who has remained in SVR(HCVRNA negative(February 2021) history of colonic polyps with last colonoscopy was in February 2020 revealing 2 tubular adenomas.  She also has sigmoid diverticulosis.  Patient presents with abdominal pain and dysphagia to solids as well as liquids.  She has had dysphagia intermittently for the past couple of months.  She has difficulty primarily with solids but occasionally with liquids.  She is on dexlansoprazole for GERD.  She is has her heartburn currently is well controlled with this medication.  She points to suprasternal area as site of bolus obstruction.  She has a history of distal esophageal stricture and has been dilated by Dr. Henrene Pastor on multiple occasions but EGD May 2015 did not reveal stricture.  She did respond to esophageal dilation.  Her second complaint is one of the right upper quadrant pain which started about 3 weeks ago.  Pain is fairly localized under the rib cage.  At times is worse with meals.  She has nausea and gagging but no vomiting.  She denies melena or rectal bleeding.  She has prone to constipation and losing medication on as-needed basis.  Patient states she may use Aleve once in a while primarily for headache and when she does use it she takes 2 at a time.  She states she may take a dose or 2 in a month but no more. Patient believes she was diagnosed with peptic ulcer disease over 10 years ago.  H. pylori status unknown. She has good appetite.  She has gained 3 pounds since her last visit of August 2019.  Review of records revealed that she weighed 158 pounds in January 2018. She has chronic back pain and  sacroiliac region as well as over lower dorsal spine. She has not had any alcohol in over 26 years.  She quit cigarette smoking 28 years ago. She does not use nebulizer daily.  She has not received Covid vaccine as of yet.   Current Medications: Outpatient Encounter Medications as of 07/24/2019  Medication Sig  . albuterol (PROVENTIL) (2.5 MG/3ML) 0.083% nebulizer solution NEBULIZE 1 VIAL EVERY 6 HOURS AS NEEDED FOR SHORTNESS OF BREATH  . albuterol (VENTOLIN HFA) 108 (90 Base) MCG/ACT inhaler USE 1 TO 2 PUFFS EVERY 4 TO 6 HOURS AS NEEDED  . buPROPion (WELLBUTRIN XL) 300 MG 24 hr tablet TAKE ONE (1) TABLET EACH DAY  . cetirizine (ZYRTEC) 10 MG tablet Take 1 tablet (10 mg total) by mouth every morning.  . citalopram (CELEXA) 20 MG tablet TAKE ONE (1) TABLET EACH DAY  . cyclobenzaprine (FLEXERIL) 10 MG tablet Take 10 mg by mouth 3 (three) times daily as needed for muscle spasms.   Marland Kitchen dexlansoprazole (DEXILANT) 60 MG capsule Take 1 capsule (60 mg total) by mouth daily.  . diazepam (VALIUM) 10 MG tablet Take 5 mg by mouth every 12 (twelve) hours as needed for anxiety.   . diclofenac sodium (VOLTAREN) 1 % GEL Apply 1 application topically 2 (two) times daily as needed (pain).   Marland Kitchen docusate sodium (COLACE) 100 MG capsule Take 100-200 mg by mouth daily as needed for mild constipation.   Marland Kitchen lisinopril (ZESTRIL) 10 MG tablet Take 1 tablet (10 mg total) by  mouth daily.  . mometasone (NASONEX) 50 MCG/ACT nasal spray USE 2 SPRAYS IN EACH NOSTRIL ONCE DAILY  . montelukast (SINGULAIR) 10 MG tablet Take 1 tablet (10 mg total) by mouth at bedtime.  . ondansetron (ZOFRAN) 4 MG tablet Take 4 mg by mouth every 8 (eight) hours as needed for nausea or vomiting.   Marland Kitchen oxymorphone (OPANA) 5 MG tablet Take 5 mg by mouth 2 (two) times daily.   . polyethylene glycol powder (GLYCOLAX/MIRALAX) 17 GM/SCOOP powder Take 17 g by mouth daily as needed.  . SYMBICORT 160-4.5 MCG/ACT inhaler USE 2 PUFFS TWICE DAILY   No  facility-administered encounter medications on file as of 07/24/2019.     Objective: Blood pressure 115/70, pulse 85, temperature (!) 97.2 F (36.2 C), temperature source Temporal, height 5' 2"  (1.575 m), weight 153 lb 4.8 oz (69.5 kg). Patient is alert and in no acute distress. She is wearing a facial mask. Conjunctiva is pink. Sclera is nonicteric Oropharyngeal mucosa is normal. She has upper and lower dentures in place. No neck masses or thyromegaly noted. Cardiac exam with regular rhythm normal S1 and S2. No murmur or gallop noted. Lungs are clear to auscultation. Abdomen is symmetrical with laparoscopy scars.  Bowel sounds are normal.  On palpation abdomen is soft.  She has mild generalized tenderness which is more pronounced below the right costal margin.  No guarding.  No hepatomegaly or masses. No LE edema or clubbing noted.  Labs/studies Results:   CBC Latest Ref Rng & Units 04/21/2018 01/27/2018 08/23/2017  WBC 4.0 - 10.5 K/uL 6.3 5.7 6.2  Hemoglobin 12.0 - 15.0 g/dL 13.3 13.4 13.0  Hematocrit 36.0 - 46.0 % 42.9 40.0 39.4  Platelets 150 - 400 K/uL 169 202 182    CMP Latest Ref Rng & Units 07/23/2019 05/18/2019 04/12/2019  Glucose 70 - 99 mg/dL - - -  BUN 6 - 20 mg/dL - - -  Creatinine 0.44 - 1.00 mg/dL - - -  Sodium 135 - 145 mmol/L - - -  Potassium 3.5 - 5.1 mmol/L - - -  Chloride 98 - 111 mmol/L - - -  CO2 22 - 32 mmol/L - - -  Calcium 8.9 - 10.3 mg/dL - - -  Total Protein 6.1 - 8.1 g/dL 6.6 6.8 6.5  Total Bilirubin 0.2 - 1.2 mg/dL 0.6 0.8 1.1  Alkaline Phos 38 - 126 U/L - - -  AST 10 - 35 U/L 44(H) 25 42(H)  ALT 6 - 29 U/L 34(H) 34(H) 42(H)    Hepatic Function Latest Ref Rng & Units 07/23/2019 05/18/2019 04/12/2019  Total Protein 6.1 - 8.1 g/dL 6.6 6.8 6.5  Albumin 3.5 - 5.0 g/dL - - -  AST 10 - 35 U/L 44(H) 25 42(H)  ALT 6 - 29 U/L 34(H) 34(H) 42(H)  Alk Phosphatase 38 - 126 U/L - - -  Total Bilirubin 0.2 - 1.2 mg/dL 0.6 0.8 1.1  Bilirubin, Direct 0.0 - 0.2 mg/dL  0.2 0.2 0.3(H)    Abdominal ultrasound from 05/15/2019 Echogenic liver with slight nodularity raising possibility of cirrhosis. Bile duct measured 4 mm and spleen is not enlarged.  Abdominal ultrasound with elastography in March 2017 Fibrosis score was F0/F1.  Barium study in March 2017 was negative for esophageal stricture.  Suggested esophageal dysmotility and small sliding hiatal hernia.   Assessment:  #1.  Esophageal dysphagia.  Patient has undergone multiple dilations in the past.  She has history of esophageal stricture.  Barium study in March 2017  suggested esophageal motility disorder and small sliding hiatal hernia.  GERD symptoms well controlled with therapy.  Since she has responded to esophageal dilation in the past it would be reasonable to reevaluate her esophagus.  #2.  Mildly elevated AST and ALT.  Patient has history of chronic hepatitis C for which she was treated back in 2017 with sustained virologic response.  HCVRNA in February 2021 was negative.  Her transaminases had returned to normal for therapy.  Nausea mildly elevated.  Ultrasound suggests fatty liver which may be due to explanation.  She does not have other risk factors for fatty liver such as obesity or diabetes mellitus.  Will monitor LFTs for now.  #3.  Right upper quadrant abdominal pain made worse with meals and associated with nausea.  She has remote history of peptic ulcer disease.  Need to rule out peptic ulcer disease for other diagnoses considered.  #4.  History of colonic adenomas.  She is up-to-date on surveillance schedule.  Last colonoscopy was in February 2020 and next exam would be in February 2025.  Plan:  Dicyclomine 10 mg p.o. 3 times daily as needed.  Patient advised to take this medication only when pain is significant.  Patient made aware of potential side effects.  If she has any she can stop this medication. Diagnostic esophagogastroduodenoscopy along with esophageal dilation to be scheduled  in near future under monitored anesthesia care. Patient advised not to take Aleve or other OTC NSAIDs.  She can take Tylenol up to 2 g on as-needed basis for musculoskeletal pain or headache. LFTs in 3 months. Office visit in 6 months.

## 2019-07-25 ENCOUNTER — Other Ambulatory Visit (INDEPENDENT_AMBULATORY_CARE_PROVIDER_SITE_OTHER): Payer: Self-pay | Admitting: *Deleted

## 2019-07-27 NOTE — Patient Instructions (Signed)
LAIHLA ALMESTICA  07/27/2019     @PREFPERIOPPHARMACY @   Your procedure is scheduled on  08/03/2019   Report to [facility] at  [place].M.  Call this number if you have problems the morning of surgery:  [phone]   Remember:  Follow the diet instructions given to you by Dr Olevia Perches office.                      Take these medicines the morning of surgery with A SIP OF WATER  Wellbutrin, zyrtec, celexa, flexeril(if needed), dexilant, valium(if needed), bentyl, zofran(if needed), opana. Use your nebulizer and your inhaler before you come.    Do not wear jewelry, make-up or nail polish.  Do not wear lotions, powders, or perfumes. Please wear deodorant and brush your teeth.  Do not shave 48 hours prior to surgery.  Men may shave face and neck.  Do not bring valuables to the hospital.  [facility] is not responsible for any belongings or valuables.  Contacts, dentures or bridgework may not be worn into surgery.  Leave your suitcase in the car.  After surgery it may be brought to your room.  For patients admitted to the hospital, discharge time will be determined by your treatment team.  Patients discharged the day of surgery will not be allowed to drive home.   Name and phone number of your driver:   family Special instructions:  DO NOT smoke the morning of your procedure.  Please read over the following fact sheets that you were given. Anesthesia Post-op Instructions and Care and Recovery After Surgery       Upper Endoscopy, Adult, Care After This sheet gives you information about how to care for yourself after your procedure. Your health care provider may also give you more specific instructions. If you have problems or questions, contact your health care provider. What can I expect after the procedure? After the procedure, it is common to have:  A sore throat.  Mild stomach pain or discomfort.  Bloating.  Nausea. Follow these instructions at home:   Follow  instructions from your health care provider about what to eat or drink after your procedure.  Return to your normal activities as told by your health care provider. Ask your health care provider what activities are safe for you.  Take over-the-counter and prescription medicines only as told by your health care provider.  Do not drive for 24 hours if you were given a sedative during your procedure.  Keep all follow-up visits as told by your health care provider. This is important. Contact a health care provider if you have:  A sore throat that lasts longer than one day.  Trouble swallowing. Get help right away if:  You vomit blood or your vomit looks like coffee grounds.  You have: ? A fever. ? Bloody, black, or tarry stools. ? A severe sore throat or you cannot swallow. ? Difficulty breathing. ? Severe pain in your chest or abdomen. Summary  After the procedure, it is common to have a sore throat, mild stomach discomfort, bloating, and nausea.  Do not drive for 24 hours if you were given a sedative during the procedure.  Follow instructions from your health care provider about what to eat or drink after your procedure.  Return to your normal activities as told by your health care provider. This information is not intended to replace advice given to you by  your health care provider. Make sure you discuss any questions you have with your health care provider. Document Revised: 08/09/2017 Document Reviewed: 07/18/2017 Elsevier Patient Education  [place].  Esophageal Dilatation Esophageal dilatation, also called esophageal dilation, is a procedure to widen or open (dilate) a blocked or narrowed part of the esophagus. The esophagus is the part of the body that moves food and liquid from the mouth to the stomach. You may need this procedure if:  You have a buildup of scar tissue in your esophagus that makes it difficult, painful, or impossible to swallow. This can be  caused by gastroesophageal reflux disease (GERD).  You have cancer of the esophagus.  There is a problem with how food moves through your esophagus. In some cases, you may need this procedure repeated at a later time to dilate the esophagus gradually. Tell a health care provider about:  Any allergies you have.  All medicines you are taking, including vitamins, herbs, eye drops, creams, and over-the-counter medicines.  Any problems you or family members have had with anesthetic medicines.  Any blood disorders you have.  Any surgeries you have had.  Any medical conditions you have.  Any antibiotic medicines you are required to take before dental procedures.  Whether you are pregnant or may be pregnant. What are the risks? Generally, this is a safe procedure. However, problems may occur, including:  Bleeding due to a tear in the lining of the esophagus.  A hole (perforation) in the esophagus. What happens before the procedure?  Follow instructions from your health care provider about eating or drinking restrictions.  Ask your health care provider about changing or stopping your regular medicines. This is especially important if you are taking diabetes medicines or blood thinners.  Plan to have someone take you home from the hospital or clinic.  Plan to have a responsible adult care for you for at least 24 hours after you leave the hospital or clinic. This is important. What happens during the procedure?  You may be given a medicine to help you relax (sedative).  A numbing medicine may be sprayed into the back of your throat, or you may gargle the medicine.  Your health care provider may perform the dilatation using various surgical instruments, such as: ? Simple dilators. This instrument is carefully placed in the esophagus to stretch it. ? Guided wire bougies. This involves using an endoscope to insert a wire into the esophagus. A dilator is passed over this wire to  enlarge the esophagus. Then the wire is removed. ? Balloon dilators. An endoscope with a small balloon at the end is inserted into the esophagus. The balloon is inflated to stretch the esophagus and open it up. The procedure may vary among health care providers and hospitals. What happens after the procedure?  Your blood pressure, heart rate, breathing rate, and blood oxygen level will be monitored until the medicines you were given have worn off.  Your throat may feel slightly sore and numb. This will improve slowly over time.  You will not be allowed to eat or drink until your throat is no longer numb.  When you are able to drink, urinate, and sit on the edge of the bed without nausea or dizziness, you may be able to return home. Follow these instructions at home:  Take over-the-counter and prescription medicines only as told by your health care provider.  Do not drive for 24 hours if you were given a sedative during your procedure.  You should have a responsible adult with you for 24 hours after the procedure.  Follow instructions from your health care provider about any eating or drinking restrictions.  Do not use any products that contain nicotine or tobacco, such as cigarettes and e-cigarettes. If you need help quitting, ask your health care provider.  Keep all follow-up visits as told by your health care provider. This is important. Get help right away if you:  Have a fever.  Have chest pain.  Have pain that is not relieved by medication.  Have trouble breathing.  Have trouble swallowing.  Vomit blood. Summary  Esophageal dilatation, also called esophageal dilation, is a procedure to widen or open (dilate) a blocked or narrowed part of the esophagus.  Plan to have someone take you home from the hospital or clinic.  For this procedure, a numbing medicine may be sprayed into the back of your throat, or you may gargle the medicine.  Do not drive for 24 hours if you  were given a sedative during your procedure. This information is not intended to replace advice given to you by your health care provider. Make sure you discuss any questions you have with your health care provider. Document Revised: 12/13/2018 Document Reviewed: 12/21/2016 Elsevier Patient Education  2020 [place] After These instructions provide you with information about caring for yourself after your procedure. Your health care provider may also give you more specific instructions. Your treatment has been planned according to current medical practices, but problems sometimes occur. Call your health care provider if you have any problems or questions after your procedure. What can I expect after the procedure? After your procedure, you may:  Feel sleepy for several hours.  Feel clumsy and have poor balance for several hours.  Feel forgetful about what happened after the procedure.  Have poor judgment for several hours.  Feel nauseous or vomit.  Have a sore throat if you had a breathing tube during the procedure. Follow these instructions at home: For at least 24 hours after the procedure:      Have a responsible adult stay with you. It is important to have someone help care for you until you are awake and alert.  Rest as needed.  Do not: ? Participate in activities in which you could fall or become injured. ? Drive. ? Use heavy machinery. ? Drink alcohol. ? Take sleeping pills or medicines that cause drowsiness. ? Make important decisions or sign legal documents. ? Take care of children on your own. Eating and drinking  Follow the diet that is recommended by your health care provider.  If you vomit, drink water, juice, or soup when you can drink without vomiting.  Make sure you have little or no nausea before eating solid foods. General instructions  Take over-the-counter and prescription medicines only as told by your health  care provider.  If you have sleep apnea, surgery and certain medicines can increase your risk for breathing problems. Follow instructions from your health care provider about wearing your sleep device: ? Anytime you are sleeping, including during daytime naps. ? While taking prescription pain medicines, sleeping medicines, or medicines that make you drowsy.  If you smoke, do not smoke without supervision.  Keep all follow-up visits as told by your health care provider. This is important. Contact a health care provider if:  You keep feeling nauseous or you keep vomiting.  You feel light-headed.  You develop a rash.  You have a  fever. Get help right away if:  You have trouble breathing. Summary  For several hours after your procedure, you may feel sleepy and have poor judgment.  Have a responsible adult stay with you for at least 24 hours or until you are awake and alert. This information is not intended to replace advice given to you by your health care provider. Make sure you discuss any questions you have with your health care provider. Document Revised: 05/16/2017 Document Reviewed: 06/08/2015 Elsevier Patient Education  [place].

## 2019-07-31 ENCOUNTER — Other Ambulatory Visit: Payer: Self-pay | Admitting: Family Medicine

## 2019-08-01 ENCOUNTER — Encounter (HOSPITAL_COMMUNITY)
Admission: RE | Admit: 2019-08-01 | Discharge: 2019-08-01 | Disposition: A | Discharge status: Home / Self Care | Payer: Medicare Other | Source: Ambulatory Visit | Attending: Internal Medicine | Admitting: Internal Medicine

## 2019-08-01 ENCOUNTER — Other Ambulatory Visit (HOSPITAL_COMMUNITY): Payer: Medicare Other

## 2019-08-02 MED ORDER — LIDOCAINE 2% (20 MG/ML) 5 ML SYRINGE
INTRAMUSCULAR | Status: AC
  Filled 2019-08-02: qty 5

## 2019-08-06 NOTE — Patient Instructions (Signed)
Teresa [name]  08/06/2019     @PREFPERIOPPHARMACY @   Your procedure is scheduled on  08/10/2019 .  Report to [facility] at  0800  A.M.  Call this number if you have problems the morning of surgery:  [phone]   Remember:  Follow the diet instructions given to you by Dr Olevia Perches office.                      Take these medicines the morning of surgery with A SIP OF WATER  Wellbutrin,zyrtec, celexa, flexeril or valium(if needed), dexilant, bentyl, zofran (if needed), opana(if needed).    Do not wear jewelry, make-up or nail polish.  Do not wear lotions, powders, or perfumes. Please wear deodorant and brush your teeth.  Do not shave 48 hours prior to surgery.  Men may shave face and neck.  Do not bring valuables to the hospital.  [facility] is not responsible for any belongings or valuables.  Contacts, dentures or bridgework may not be worn into surgery.  Leave your suitcase in the car.  After surgery it may be brought to your room.  For patients admitted to the hospital, discharge time will be determined by your treatment team.  Patients discharged the day of surgery will not be allowed to drive home.   Name and phone number of your driver:   family Special instructions:  DO NOT smoke the morning of your procedure.  Please read over the following fact sheets that you were given. Anesthesia Post-op Instructions and Care and Recovery After Surgery       Upper Endoscopy, Adult, Care After This sheet gives you information about how to care for yourself after your procedure. Your health care provider may also give you more specific instructions. If you have problems or questions, contact your health care provider. What can I expect after the procedure? After the procedure, it is common to have:  A sore throat.  Mild stomach pain or discomfort.  Bloating.  Nausea. Follow these instructions at home:   Follow instructions from your health care provider about  what to eat or drink after your procedure.  Return to your normal activities as told by your health care provider. Ask your health care provider what activities are safe for you.  Take over-the-counter and prescription medicines only as told by your health care provider.  Do not drive for 24 hours if you were given a sedative during your procedure.  Keep all follow-up visits as told by your health care provider. This is important. Contact a health care provider if you have:  A sore throat that lasts longer than one day.  Trouble swallowing. Get help right away if:  You vomit blood or your vomit looks like coffee grounds.  You have: ? A fever. ? Bloody, black, or tarry stools. ? A severe sore throat or you cannot swallow. ? Difficulty breathing. ? Severe pain in your chest or abdomen. Summary  After the procedure, it is common to have a sore throat, mild stomach discomfort, bloating, and nausea.  Do not drive for 24 hours if you were given a sedative during the procedure.  Follow instructions from your health care provider about what to eat or drink after your procedure.  Return to your normal activities as told by your health care provider. This information is not intended to replace advice given to you by your health care provider. Make sure you discuss  any questions you have with your health care provider. Document Revised: 08/09/2017 Document Reviewed: 07/18/2017 Elsevier Patient Education  [place].  Esophageal Dilatation Esophageal dilatation, also called esophageal dilation, is a procedure to widen or open (dilate) a blocked or narrowed part of the esophagus. The esophagus is the part of the body that moves food and liquid from the mouth to the stomach. You may need this procedure if:  You have a buildup of scar tissue in your esophagus that makes it difficult, painful, or impossible to swallow. This can be caused by gastroesophageal reflux disease  (GERD).  You have cancer of the esophagus.  There is a problem with how food moves through your esophagus. In some cases, you may need this procedure repeated at a later time to dilate the esophagus gradually. Tell a health care provider about:  Any allergies you have.  All medicines you are taking, including vitamins, herbs, eye drops, creams, and over-the-counter medicines.  Any problems you or family members have had with anesthetic medicines.  Any blood disorders you have.  Any surgeries you have had.  Any medical conditions you have.  Any antibiotic medicines you are required to take before dental procedures.  Whether you are pregnant or may be pregnant. What are the risks? Generally, this is a safe procedure. However, problems may occur, including:  Bleeding due to a tear in the lining of the esophagus.  A hole (perforation) in the esophagus. What happens before the procedure?  Follow instructions from your health care provider about eating or drinking restrictions.  Ask your health care provider about changing or stopping your regular medicines. This is especially important if you are taking diabetes medicines or blood thinners.  Plan to have someone take you home from the hospital or clinic.  Plan to have a responsible adult care for you for at least 24 hours after you leave the hospital or clinic. This is important. What happens during the procedure?  You may be given a medicine to help you relax (sedative).  A numbing medicine may be sprayed into the back of your throat, or you may gargle the medicine.  Your health care provider may perform the dilatation using various surgical instruments, such as: ? Simple dilators. This instrument is carefully placed in the esophagus to stretch it. ? Guided wire bougies. This involves using an endoscope to insert a wire into the esophagus. A dilator is passed over this wire to enlarge the esophagus. Then the wire is  removed. ? Balloon dilators. An endoscope with a small balloon at the end is inserted into the esophagus. The balloon is inflated to stretch the esophagus and open it up. The procedure may vary among health care providers and hospitals. What happens after the procedure?  Your blood pressure, heart rate, breathing rate, and blood oxygen level will be monitored until the medicines you were given have worn off.  Your throat may feel slightly sore and numb. This will improve slowly over time.  You will not be allowed to eat or drink until your throat is no longer numb.  When you are able to drink, urinate, and sit on the edge of the bed without nausea or dizziness, you may be able to return home. Follow these instructions at home:  Take over-the-counter and prescription medicines only as told by your health care provider.  Do not drive for 24 hours if you were given a sedative during your procedure.  You should have a responsible adult with  you for 24 hours after the procedure.  Follow instructions from your health care provider about any eating or drinking restrictions.  Do not use any products that contain nicotine or tobacco, such as cigarettes and e-cigarettes. If you need help quitting, ask your health care provider.  Keep all follow-up visits as told by your health care provider. This is important. Get help right away if you:  Have a fever.  Have chest pain.  Have pain that is not relieved by medication.  Have trouble breathing.  Have trouble swallowing.  Vomit blood. Summary  Esophageal dilatation, also called esophageal dilation, is a procedure to widen or open (dilate) a blocked or narrowed part of the esophagus.  Plan to have someone take you home from the hospital or clinic.  For this procedure, a numbing medicine may be sprayed into the back of your throat, or you may gargle the medicine.  Do not drive for 24 hours if you were given a sedative during your  procedure. This information is not intended to replace advice given to you by your health care provider. Make sure you discuss any questions you have with your health care provider. Document Revised: 12/13/2018 Document Reviewed: 12/21/2016 Elsevier Patient Education  2020 [place] After These instructions provide you with information about caring for yourself after your procedure. Your health care provider may also give you more specific instructions. Your treatment has been planned according to current medical practices, but problems sometimes occur. Call your health care provider if you have any problems or questions after your procedure. What can I expect after the procedure? After your procedure, you may:  Feel sleepy for several hours.  Feel clumsy and have poor balance for several hours.  Feel forgetful about what happened after the procedure.  Have poor judgment for several hours.  Feel nauseous or vomit.  Have a sore throat if you had a breathing tube during the procedure. Follow these instructions at home: For at least 24 hours after the procedure:      Have a responsible adult stay with you. It is important to have someone help care for you until you are awake and alert.  Rest as needed.  Do not: ? Participate in activities in which you could fall or become injured. ? Drive. ? Use heavy machinery. ? Drink alcohol. ? Take sleeping pills or medicines that cause drowsiness. ? Make important decisions or sign legal documents. ? Take care of children on your own. Eating and drinking  Follow the diet that is recommended by your health care provider.  If you vomit, drink water, juice, or soup when you can drink without vomiting.  Make sure you have little or no nausea before eating solid foods. General instructions  Take over-the-counter and prescription medicines only as told by your health care provider.  If you have sleep  apnea, surgery and certain medicines can increase your risk for breathing problems. Follow instructions from your health care provider about wearing your sleep device: ? Anytime you are sleeping, including during daytime naps. ? While taking prescription pain medicines, sleeping medicines, or medicines that make you drowsy.  If you smoke, do not smoke without supervision.  Keep all follow-up visits as told by your health care provider. This is important. Contact a health care provider if:  You keep feeling nauseous or you keep vomiting.  You feel light-headed.  You develop a rash.  You have a fever. Get help right away if:  You have trouble breathing. Summary  For several hours after your procedure, you may feel sleepy and have poor judgment.  Have a responsible adult stay with you for at least 24 hours or until you are awake and alert. This information is not intended to replace advice given to you by your health care provider. Make sure you discuss any questions you have with your health care provider. Document Revised: 05/16/2017 Document Reviewed: 06/08/2015 Elsevier Patient Education  [place].

## 2019-08-08 ENCOUNTER — Encounter (HOSPITAL_COMMUNITY): Payer: Self-pay

## 2019-08-08 ENCOUNTER — Encounter (HOSPITAL_COMMUNITY)
Admission: RE | Admit: 2019-08-08 | Discharge: 2019-08-08 | Disposition: A | Discharge status: Home / Self Care | Payer: Medicare Other | Source: Ambulatory Visit | Attending: Internal Medicine | Admitting: Internal Medicine

## 2019-08-08 ENCOUNTER — Other Ambulatory Visit (HOSPITAL_COMMUNITY)
Admission: RE | Admit: 2019-08-08 | Discharge: 2019-08-08 | Disposition: A | Discharge status: Home / Self Care | Payer: Medicare Other | Source: Ambulatory Visit | Attending: Internal Medicine | Admitting: Internal Medicine

## 2019-08-08 ENCOUNTER — Other Ambulatory Visit: Payer: Self-pay

## 2019-08-08 DIAGNOSIS — R1011 Right upper quadrant pain: Secondary | ICD-10-CM

## 2019-08-08 DIAGNOSIS — Z20822 Contact with and (suspected) exposure to covid-19: Secondary | ICD-10-CM | POA: Insufficient documentation

## 2019-08-08 DIAGNOSIS — R1319 Other dysphagia: Secondary | ICD-10-CM

## 2019-08-08 DIAGNOSIS — Z01812 Encounter for preprocedural laboratory examination: Secondary | ICD-10-CM | POA: Insufficient documentation

## 2019-08-08 HISTORY — DX: Chronic obstructive pulmonary disease, unspecified: J44.9

## 2019-08-08 LAB — COMPREHENSIVE METABOLIC PANEL
ALT: 25 U/L (ref 0–44)
AST: 25 U/L (ref 15–41)
Albumin: 4 g/dL (ref 3.5–5.0)
Alkaline Phosphatase: 66 U/L (ref 38–126)
Anion gap: 7 (ref 5–15)
BUN: 11 mg/dL (ref 8–23)
CO2: 26 mmol/L (ref 22–32)
Calcium: 8.6 mg/dL — ABNORMAL LOW (ref 8.9–10.3)
Chloride: 106 mmol/L (ref 98–111)
Creatinine, Ser: 0.91 mg/dL (ref 0.44–1.00)
GFR calc Af Amer: >60 mL/min (ref >60)
GFR calc non Af Amer: >60 mL/min (ref >60)
Glucose, Bld: 98 mg/dL (ref 70–99)
Potassium: 4.3 mmol/L (ref 3.5–5.1)
Sodium: 139 mmol/L (ref 135–145)
Total Bilirubin: 1.1 mg/dL (ref 0.3–1.2)
Total Protein: 6.9 g/dL (ref 6.5–8.1)

## 2019-08-08 LAB — CBC WITH DIFFERENTIAL/PLATELET
Abs Immature Granulocytes: 0.02 10*3/uL (ref 0.00–0.07)
Basophils Absolute: 0 10*3/uL (ref 0.0–0.1)
Basophils Relative: 1 %
Eosinophils Absolute: 0.1 10*3/uL (ref 0.0–0.5)
Eosinophils Relative: 2 %
HCT: 41.3 % (ref 36.0–46.0)
Hemoglobin: 13.2 g/dL (ref 12.0–15.0)
Immature Granulocytes: 0 %
Lymphocytes Relative: 27 %
Lymphs Abs: 1.5 10*3/uL (ref 0.7–4.0)
MCH: 28.3 pg (ref 26.0–34.0)
MCHC: 32 g/dL (ref 30.0–36.0)
MCV: 88.6 fL (ref 80.0–100.0)
Monocytes Absolute: 0.4 10*3/uL (ref 0.1–1.0)
Monocytes Relative: 7 %
Neutro Abs: 3.5 10*3/uL (ref 1.7–7.7)
Neutrophils Relative %: 63 %
Platelets: 161 10*3/uL (ref 150–400)
RBC: 4.66 MIL/uL (ref 3.87–5.11)
RDW: 14.5 % (ref 11.5–15.5)
WBC: 5.6 10*3/uL (ref 4.0–10.5)
nRBC: 0 % (ref 0.0–0.2)

## 2019-08-08 LAB — PROTIME-INR
INR: 1 (ref 0.8–1.2)
Prothrombin Time: 12.8 seconds (ref 11.4–15.2)

## 2019-08-09 LAB — SARS CORONAVIRUS 2 (TAT 6-24 HRS): SARS Coronavirus 2: NEGATIVE

## 2019-08-10 ENCOUNTER — Other Ambulatory Visit: Payer: Self-pay

## 2019-08-10 ENCOUNTER — Ambulatory Visit (HOSPITAL_COMMUNITY)
Admission: RE | Admit: 2019-08-10 | Discharge: 2019-08-10 | Disposition: A | Discharge status: Home / Self Care | Payer: Medicare Other | Attending: Internal Medicine | Admitting: Internal Medicine

## 2019-08-10 ENCOUNTER — Encounter (HOSPITAL_COMMUNITY)
Admission: RE | Disposition: A | Discharge status: Home / Self Care | Payer: Self-pay | Source: Home / Self Care | Attending: Internal Medicine

## 2019-08-10 ENCOUNTER — Ambulatory Visit (HOSPITAL_COMMUNITY): Payer: Medicare Other | Admitting: Anesthesiology

## 2019-08-10 ENCOUNTER — Encounter (HOSPITAL_COMMUNITY): Payer: Self-pay | Admitting: Internal Medicine

## 2019-08-10 DIAGNOSIS — R1319 Other dysphagia: Secondary | ICD-10-CM

## 2019-08-10 DIAGNOSIS — K228 Other specified diseases of esophagus: Secondary | ICD-10-CM | POA: Insufficient documentation

## 2019-08-10 DIAGNOSIS — K319 Disease of stomach and duodenum, unspecified: Secondary | ICD-10-CM | POA: Insufficient documentation

## 2019-08-10 DIAGNOSIS — Q394 Esophageal web: Secondary | ICD-10-CM | POA: Insufficient documentation

## 2019-08-10 DIAGNOSIS — Z7951 Long term (current) use of inhaled steroids: Secondary | ICD-10-CM | POA: Diagnosis not present

## 2019-08-10 DIAGNOSIS — Z79899 Other long term (current) drug therapy: Secondary | ICD-10-CM | POA: Diagnosis not present

## 2019-08-10 DIAGNOSIS — Z87891 Personal history of nicotine dependence: Secondary | ICD-10-CM | POA: Diagnosis not present

## 2019-08-10 DIAGNOSIS — Z20822 Contact with and (suspected) exposure to covid-19: Secondary | ICD-10-CM | POA: Diagnosis not present

## 2019-08-10 DIAGNOSIS — K449 Diaphragmatic hernia without obstruction or gangrene: Secondary | ICD-10-CM | POA: Insufficient documentation

## 2019-08-10 DIAGNOSIS — R1013 Epigastric pain: Secondary | ICD-10-CM | POA: Insufficient documentation

## 2019-08-10 DIAGNOSIS — F329 Major depressive disorder, single episode, unspecified: Secondary | ICD-10-CM | POA: Diagnosis not present

## 2019-08-10 DIAGNOSIS — R1011 Right upper quadrant pain: Secondary | ICD-10-CM | POA: Diagnosis not present

## 2019-08-10 DIAGNOSIS — I10 Essential (primary) hypertension: Secondary | ICD-10-CM | POA: Insufficient documentation

## 2019-08-10 DIAGNOSIS — K219 Gastro-esophageal reflux disease without esophagitis: Secondary | ICD-10-CM | POA: Insufficient documentation

## 2019-08-10 DIAGNOSIS — R1314 Dysphagia, pharyngoesophageal phase: Secondary | ICD-10-CM | POA: Insufficient documentation

## 2019-08-10 DIAGNOSIS — K2970 Gastritis, unspecified, without bleeding: Secondary | ICD-10-CM | POA: Insufficient documentation

## 2019-08-10 DIAGNOSIS — K589 Irritable bowel syndrome without diarrhea: Secondary | ICD-10-CM | POA: Insufficient documentation

## 2019-08-10 DIAGNOSIS — K3189 Other diseases of stomach and duodenum: Secondary | ICD-10-CM | POA: Diagnosis not present

## 2019-08-10 DIAGNOSIS — J449 Chronic obstructive pulmonary disease, unspecified: Secondary | ICD-10-CM | POA: Diagnosis not present

## 2019-08-10 HISTORY — PX: BIOPSY: SHX5522

## 2019-08-10 HISTORY — PX: ESOPHAGEAL DILATION: SHX303

## 2019-08-10 HISTORY — PX: ESOPHAGOGASTRODUODENOSCOPY (EGD) WITH PROPOFOL: SHX5813

## 2019-08-10 SURGERY — ESOPHAGOGASTRODUODENOSCOPY (EGD) WITH PROPOFOL
Anesthesia: General

## 2019-08-10 MED ORDER — CHLORHEXIDINE GLUCONATE CLOTH 2 % EX PADS: 6.0000 | MEDICATED_PAD | Freq: Once | CUTANEOUS | Status: DC

## 2019-08-10 MED ORDER — GLYCOPYRROLATE 0.2 MG/ML IJ SOLN
0.2000 mg | Freq: Once | INTRAMUSCULAR | Status: AC
  Administered 2019-08-10: 0.2 mg via INTRAVENOUS
  Filled 2019-08-10: qty 1

## 2019-08-10 MED ORDER — MIDAZOLAM HCL 2 MG/2ML IJ SOLN
INTRAMUSCULAR | Status: AC
  Filled 2019-08-10: qty 2

## 2019-08-10 MED ORDER — LIDOCAINE VISCOUS HCL 2 % MT SOLN
15.0000 mL | Freq: Once | OROMUCOSAL | Status: AC
  Administered 2019-08-10: 15 mL via OROMUCOSAL

## 2019-08-10 MED ORDER — PROPOFOL 500 MG/50ML IV EMUL
INTRAVENOUS | Status: DC | PRN
  Administered 2019-08-10: 150 ug/kg/min via INTRAVENOUS

## 2019-08-10 MED ORDER — LACTATED RINGERS IV SOLN
INTRAVENOUS | Status: DC | PRN
Start: 2019-08-10 — End: 2019-08-10

## 2019-08-10 MED ORDER — MIDAZOLAM HCL 5 MG/5ML IJ SOLN
INTRAMUSCULAR | Status: DC | PRN
  Administered 2019-08-10: 2 mg via INTRAVENOUS

## 2019-08-10 MED ORDER — LIDOCAINE VISCOUS HCL 2 % MT SOLN
OROMUCOSAL | Status: AC
  Filled 2019-08-10: qty 15

## 2019-08-10 MED ORDER — PROPOFOL 10 MG/ML IV BOLUS
INTRAVENOUS | Status: DC | PRN
  Administered 2019-08-10: 60 mg via INTRAVENOUS

## 2019-08-10 MED ORDER — LACTATED RINGERS IV SOLN: Freq: Once | INTRAVENOUS | Status: AC

## 2019-08-10 MED ORDER — PROPOFOL 10 MG/ML IV BOLUS
INTRAVENOUS | Status: AC
  Filled 2019-08-10: qty 20

## 2019-08-10 NOTE — Discharge Instructions (Signed)
Notes from [facility]:  No aspirin or NSAIDs for 24 hours. Resume usual medications as before. Resume usual diet. No driving for 24 hours. Physician will call with biopsy results next week.       Upper Endoscopy, Adult, Care After This sheet gives you information about how to care for yourself after your procedure. Your health care provider may also give you more specific instructions. If you have problems or questions, contact your health care provider. What can I expect after the procedure? After the procedure, it is common to have:  A sore throat.  Mild stomach pain or discomfort.  Bloating.  Nausea. Follow these instructions at home:   Follow instructions from your health care provider about what to eat or drink after your procedure.  Return to your normal activities as told by your health care provider. Ask your health care provider what activities are safe for you.  Take over-the-counter and prescription medicines only as told by your health care provider.  Do not drive for 24 hours if you were given a sedative during your procedure.  Keep all follow-up visits as told by your health care provider. This is important. Contact a health care provider if you have:  A sore throat that lasts longer than one day.  Trouble swallowing. Get help right away if:  You vomit blood or your vomit looks like coffee grounds.  You have: ? A fever. ? Bloody, black, or tarry stools. ? A severe sore throat or you cannot swallow. ? Difficulty breathing. ? Severe pain in your chest or abdomen. Summary  After the procedure, it is common to have a sore throat, mild stomach discomfort, bloating, and nausea.  Do not drive for 24 hours if you were given a sedative during the procedure.  Follow instructions from your health care provider about what to eat or drink after your procedure.  Return to your normal activities as told by your health care provider. This information is not  intended to replace advice given to you by your health care provider. Make sure you discuss any questions you have with your health care provider. Document Revised: 08/09/2017 Document Reviewed: 07/18/2017 Elsevier Patient Education  2020 [place] After These instructions provide you with information about caring for yourself after your procedure. Your health care provider may also give you more specific instructions. Your treatment has been planned according to current medical practices, but problems sometimes occur. Call your health care provider if you have any problems or questions after your procedure. What can I expect after the procedure? After your procedure, you may:  Feel sleepy for several hours.  Feel clumsy and have poor balance for several hours.  Feel forgetful about what happened after the procedure.  Have poor judgment for several hours.  Feel nauseous or vomit.  Have a sore throat if you had a breathing tube during the procedure. Follow these instructions at home: For at least 24 hours after the procedure:      Have a responsible adult stay with you. It is important to have someone help care for you until you are awake and alert.  Rest as needed.  Do not: ? Participate in activities in which you could fall or become injured. ? Drive. ? Use heavy machinery. ? Drink alcohol. ? Take sleeping pills or medicines that cause drowsiness. ? Make important decisions or sign legal documents. ? Take care of children on your own. Eating and drinking  Follow the diet that is recommended by your health care provider.  If you vomit, drink water, juice, or soup when you can drink without vomiting.  Make sure you have little or no nausea before eating solid foods. General instructions  Take over-the-counter and prescription medicines only as told by your health care provider.  If you have sleep apnea, surgery and certain  medicines can increase your risk for breathing problems. Follow instructions from your health care provider about wearing your sleep device: ? Anytime you are sleeping, including during daytime naps. ? While taking prescription pain medicines, sleeping medicines, or medicines that make you drowsy.  If you smoke, do not smoke without supervision.  Keep all follow-up visits as told by your health care provider. This is important. Contact a health care provider if:  You keep feeling nauseous or you keep vomiting.  You feel light-headed.  You develop a rash.  You have a fever. Get help right away if:  You have trouble breathing. Summary  For several hours after your procedure, you may feel sleepy and have poor judgment.  Have a responsible adult stay with you for at least 24 hours or until you are awake and alert. This information is not intended to replace advice given to you by your health care provider. Make sure you discuss any questions you have with your health care provider. Document Revised: 05/16/2017 Document Reviewed: 06/08/2015 Elsevier Patient Education  [place].   Gastritis, Adult  Gastritis is swelling (inflammation) of the stomach. Gastritis can develop quickly (acute). It can also develop slowly over time (chronic). It is important to get help for this condition. If you do not get help, your stomach can bleed, and you can get sores (ulcers) in your stomach. What are the causes? This condition may be caused by:  Germs that get to your stomach.  Drinking too much alcohol.  Medicines you are taking.  Too much acid in the stomach.  A disease of the intestines or stomach.  Stress.  An allergic reaction.  Crohn's disease.  Some cancer treatments (radiation). Sometimes the cause of this condition is not known. What are the signs or symptoms? Symptoms of this condition include:  Pain in your stomach.  A burning feeling in your  stomach.  Feeling sick to your stomach (nauseous).  Throwing up (vomiting).  Feeling too full after you eat.  Weight loss.  Bad breath.  Throwing up blood.  Blood in your poop (stool). How is this diagnosed? This condition may be diagnosed with:  Your medical history and symptoms.  A physical exam.  Tests. These can include: ? Blood tests. ? Stool tests. ? A procedure to look inside your stomach (upper endoscopy). ? A test in which a sample of tissue is taken for testing (biopsy). How is this treated? Treatment for this condition depends on what caused it. You may be given:  Antibiotic medicine, if your condition was caused by germs.  H2 blockers and similar medicines, if your condition was caused by too much acid. Follow these instructions at home: Medicines  Take over-the-counter and prescription medicines only as told by your doctor.  If you were prescribed an antibiotic medicine, take it as told by your doctor. Do not stop taking it even if you start to feel better. Eating and drinking   Eat small meals often, instead of large meals.  Avoid foods and drinks that make your symptoms worse.  Drink enough fluid to keep your pee (urine) pale  yellow. Alcohol use  Do not drink alcohol if: ? Your doctor tells you not to drink. ? You are pregnant, may be pregnant, or are planning to become pregnant.  If you drink alcohol: ? Limit your use to:  0-1 drink a day for women.  0-2 drinks a day for men. ? Be aware of how much alcohol is in your drink. In the [place] one drink equals one 12 oz bottle of beer (355 mL), one 5 oz glass of wine (148 mL), or one 1 oz glass of hard liquor (44 mL). General instructions  Talk with your doctor about ways to manage stress. You can exercise or do deep breathing, meditation, or yoga.  Do not smoke or use products that have nicotine or tobacco. If you need help quitting, ask your doctor.  Keep all follow-up visits as told by  your doctor. This is important. Contact a doctor if:  Your symptoms get worse.  Your symptoms go away and then come back. Get help right away if:  You throw up blood or something that looks like coffee grounds.  You have black or dark red poop.  You throw up any time you try to drink fluids.  Your stomach pain gets worse.  You have a fever.  You do not feel better after one week. Summary  Gastritis is swelling (inflammation) of the stomach.  You must get help for this condition. If you do not get help, your stomach can bleed, and you can get sores (ulcers).  This condition is diagnosed with medical history, physical exam, or tests.  You can be treated with medicines for germs or medicines to block too much acid in your stomach. This information is not intended to replace advice given to you by your health care provider. Make sure you discuss any questions you have with your health care provider. Document Revised: 07/05/2017 Document Reviewed: 07/05/2017 Elsevier Patient Education  [place].  Esophageal Dilatation Esophageal dilatation, also called esophageal dilation, is a procedure to widen or open (dilate) a blocked or narrowed part of the esophagus. The esophagus is the part of the body that moves food and liquid from the mouth to the stomach. You may need this procedure if:  You have a buildup of scar tissue in your esophagus that makes it difficult, painful, or impossible to swallow. This can be caused by gastroesophageal reflux disease (GERD).  You have cancer of the esophagus.  There is a problem with how food moves through your esophagus. In some cases, you may need this procedure repeated at a later time to dilate the esophagus gradually. Tell a health care provider about:  Any allergies you have.  All medicines you are taking, including vitamins, herbs, eye drops, creams, and over-the-counter medicines.  Any problems you or family members have had  with anesthetic medicines.  Any blood disorders you have.  Any surgeries you have had.  Any medical conditions you have.  Any antibiotic medicines you are required to take before dental procedures.  Whether you are pregnant or may be pregnant. What are the risks? Generally, this is a safe procedure. However, problems may occur, including:  Bleeding due to a tear in the lining of the esophagus.  A hole (perforation) in the esophagus. What happens before the procedure?  Follow instructions from your health care provider about eating or drinking restrictions.  Ask your health care provider about changing or stopping your regular medicines. This is especially important if you are taking  diabetes medicines or blood thinners.  Plan to have someone take you home from the hospital or clinic.  Plan to have a responsible adult care for you for at least 24 hours after you leave the hospital or clinic. This is important. What happens during the procedure?  You may be given a medicine to help you relax (sedative).  A numbing medicine may be sprayed into the back of your throat, or you may gargle the medicine.  Your health care provider may perform the dilatation using various surgical instruments, such as: ? Simple dilators. This instrument is carefully placed in the esophagus to stretch it. ? Guided wire bougies. This involves using an endoscope to insert a wire into the esophagus. A dilator is passed over this wire to enlarge the esophagus. Then the wire is removed. ? Balloon dilators. An endoscope with a small balloon at the end is inserted into the esophagus. The balloon is inflated to stretch the esophagus and open it up. The procedure may vary among health care providers and hospitals. What happens after the procedure?  Your blood pressure, heart rate, breathing rate, and blood oxygen level will be monitored until the medicines you were given have worn off.  Your throat may feel  slightly sore and numb. This will improve slowly over time.  You will not be allowed to eat or drink until your throat is no longer numb.  When you are able to drink, urinate, and sit on the edge of the bed without nausea or dizziness, you may be able to return home. Follow these instructions at home:  Take over-the-counter and prescription medicines only as told by your health care provider.  Do not drive for 24 hours if you were given a sedative during your procedure.  You should have a responsible adult with you for 24 hours after the procedure.  Follow instructions from your health care provider about any eating or drinking restrictions.  Do not use any products that contain nicotine or tobacco, such as cigarettes and e-cigarettes. If you need help quitting, ask your health care provider.  Keep all follow-up visits as told by your health care provider. This is important. Get help right away if you:  Have a fever.  Have chest pain.  Have pain that is not relieved by medication.  Have trouble breathing.  Have trouble swallowing.  Vomit blood. Summary  Esophageal dilatation, also called esophageal dilation, is a procedure to widen or open (dilate) a blocked or narrowed part of the esophagus.  Plan to have someone take you home from the hospital or clinic.  For this procedure, a numbing medicine may be sprayed into the back of your throat, or you may gargle the medicine.  Do not drive for 24 hours if you were given a sedative during your procedure. This information is not intended to replace advice given to you by your health care provider. Make sure you discuss any questions you have with your health care provider. Document Revised: 12/13/2018 Document Reviewed: 12/21/2016 Elsevier Patient Education  2020 [employer].

## 2019-08-10 NOTE — Anesthesia Preprocedure Evaluation (Signed)
Anesthesia Evaluation  Patient identified by MRN, date of birth, ID band Patient awake    Reviewed: Allergy & Precautions, NPO status , Patient's Chart, lab work & pertinent test results  History of Anesthesia Complications Negative for: history of anesthetic complications  Airway Mallampati: II  TM Distance: >3 FB Neck ROM: Full   Comment: Vocal cord polyp Dental  (+) Lower Dentures, Upper Dentures   Pulmonary shortness of breath and with exertion, asthma , COPD,  COPD inhaler, former smoker,    Pulmonary exam normal breath sounds clear to auscultation       Cardiovascular Exercise Tolerance: Poor hypertension, Pt. on medications Normal cardiovascular exam Rhythm:Regular Rate:Normal - Systolic murmurs, - Diastolic murmurs, - Friction Rub, - Carotid Bruit, - Peripheral Edema and - Systolic Click    Neuro/Psych  Headaches, Seizures -, Well Controlled,  PSYCHIATRIC DISORDERS Anxiety Depression  Neuromuscular disease    GI/Hepatic hiatal hernia, GERD  Medicated,(+)     substance abuse (alcohol abuse in remission)  alcohol use, Hepatitis -, C  Endo/Other  negative endocrine ROS  Renal/GU negative Renal ROS  negative genitourinary   Musculoskeletal  (+) Arthritis  (chronic back pain),   Abdominal   Peds negative pediatric ROS (+)  Hematology negative hematology ROS (+)   Anesthesia Other Findings   Reproductive/Obstetrics                             Anesthesia Physical Anesthesia Plan  ASA: III  Anesthesia Plan: General   Post-op Pain Management:    Induction: Intravenous  PONV Risk Score and Plan: 2 and TIVA  Airway Management Planned: Nasal Cannula, Natural Airway and Simple Face Mask  Additional Equipment:   Intra-op Plan:   Post-operative Plan:   Informed Consent: I have reviewed the patients History and Physical, chart, labs and discussed the procedure including the  risks, benefits and alternatives for the proposed anesthesia with the patient or authorized representative who has indicated his/her understanding and acceptance.       Plan Discussed with: CRNA and Surgeon  Anesthesia Plan Comments:         Anesthesia Quick Evaluation

## 2019-08-10 NOTE — H&P (Signed)
Teresa [name] is an 64 y.o. female.   Chief Complaint: Patient is here for esophagogastroduodenoscopy with esophageal dilation. HPI: Patient is 64 year old Caucasian female who has chronic GERD maintained on PPI who presents with intermittent solid food dysphagia of 2 months duration.  She has a history of esophageal stricture and has undergone dilation in the past and most recently in 2014 by Dr. Scarlette Shorts.  She points to suprasternal area as site of bolus obstruction.  She has difficulty with solids but not with liquids.  She feels heartburn is well controlled dexlansoprazole.  She also had barium study in March 2017 which suggested esophageal motility disorder.  She also complains of intermittent pain in the right upper quadrant epigastrium made worse with meals and associated with nausea but no vomiting.  She denies melena or rectal bleeding.  She denies anorexia or weight loss.  She does not take aspirin or anticoagulants.   Past Medical History:  Diagnosis Date  . Anxiety state, unspecified   . Asthma    prn inhaler  . Chronic back pain greater than 3 months duration   . Colon polyp   . COPD (chronic obstructive pulmonary disease) ([place])   . Degeneration of intervertebral disc, site unspecified   . Depressive disorder, not elsewhere classified   . Diverticulitis   . Diverticulosis 07-08-2005   colonoscopy  . Esophageal stricture 2014  . Full dentures   . GERD (gastroesophageal reflux disease) 07-08-2005   EGD  . Hepatitis C   . Hiatal hernia 07-08-2005   EGD  . Hypertension   . Insomnia   . Irritable bowel syndrome   . Migraine   . Muscle spasm   . Osteoarthrosis, unspecified whether generalized or localized, lower leg   . Papilloma of breast 09/2011   left  . Seasonal allergies   . Seizures ([place]) 1990s   x 1, unknown cause; no seizures since; no meds, unknown etiology  . Vocal cord polyp    history of    Past Surgical History:  Procedure Laterality Date  . ABDOMINAL  HYSTERECTOMY     partial  . BILATERAL SALPINGOOPHORECTOMY    . BLADDER SURGERY     bladder tack  . BREAST BIOPSY  11/03/2011   Procedure: BREAST BIOPSY WITH NEEDLE LOCALIZATION;  Surgeon: Harl Bowie, MD;  Location: [facility];  Service: General;  Laterality: Left;  needle localized left breast biopsy  . CHOLECYSTECTOMY    . COLONOSCOPY  07/08/2005   562.10  . COLONOSCOPY WITH PROPOFOL N/A 04/28/2018   Procedure: COLONOSCOPY WITH PROPOFOL;  Surgeon: Rogene Houston, MD;  [facility];  Service: Endoscopy;  Laterality: N/A;  830  . CYSTOSCOPY  08/24/2011   Procedure: CYSTOSCOPY;  Surgeon: Reece Packer, MD;  Location: [facility];  Service: Urology;  Laterality: N/A;  Cystoscopy,  Rectocele Repair and Vault Prolapse Repair  . ESOPHAGOGASTRODUODENOSCOPY  10/03/2012   multiple   . OTHER SURGICAL HISTORY     exc. vocal cord polyp  . POLYPECTOMY  04/28/2018   Procedure: POLYPECTOMY;  Surgeon: Rogene Houston, MD;  [facility];  Service: Endoscopy;;  colon  . RECTAL TUMOR BY PROCTOTOMY EXCISION    . RECTOCELE REPAIR  08/24/2011   Procedure: POSTERIOR REPAIR (RECTOCELE);  Surgeon: Reece Packer, MD;  Location: [facility];  Service: Urology;  Laterality: N/A;  . rectocelle repair    . TUMOR REMOVAL     benign  . VAGINAL PROLAPSE REPAIR  08/24/2011  Procedure: VAGINAL VAULT SUSPENSION;  Surgeon: Reece Packer, MD;  Location: [facility];  Service: Urology;  Laterality: N/A;    Family History  Problem Relation Age of Onset  . Prostate cancer Father   . Sleep apnea Father   . Stomach cancer Maternal Uncle   . Diabetes Maternal Grandmother   . Stroke Maternal Grandmother   . Heart disease Paternal Grandmother   . Irritable bowel syndrome Other   . Stroke Maternal Grandfather    Social History:  reports that she quit smoking about 27 years ago. Her smoking use included cigarettes. She has a 0.75 pack-year smoking history. She has never used  smokeless tobacco. She reports previous drug use. She reports that she does not drink alcohol.  Allergies:  Allergies  Allergen Reactions  . Lyrica [Pregabalin] Shortness Of Breath  . Trazodone And Nefazodone Shortness Of Breath  . Amitriptyline   . Arthrotec [Diclofenac-Misoprostol] Other (See Comments)    Upset stomach  . Keflex [Cephalexin] Other (See Comments)    Unknown reaction  . Naproxen Other (See Comments)    Irritates stomach  . Prozac [Fluoxetine Hcl] Other (See Comments)    "makes me feel bad"  . Remeron [Mirtazapine] Other (See Comments)    "off balance"  . Zolpidem Other (See Comments)    Other  . Ambien [Zolpidem Tartrate] Other (See Comments)    COULDN'T FUNCTION, STAYED SLEEPY    Medications Prior to Admission  Medication Sig Dispense Refill  . albuterol (PROVENTIL) (2.5 MG/3ML) 0.083% nebulizer solution NEBULIZE 1 VIAL EVERY 6 HOURS AS NEEDED FOR SHORTNESS OF BREATH 150 mL 1  . albuterol (VENTOLIN HFA) 108 (90 Base) MCG/ACT inhaler USE 1 TO 2 PUFFS EVERY 4 TO 6 HOURS AS NEEDED 25.5 g 4  . buPROPion (WELLBUTRIN XL) 300 MG 24 hr tablet TAKE ONE (1) TABLET EACH DAY 90 tablet 1  . cetirizine (ZYRTEC) 10 MG tablet Take 1 tablet (10 mg total) by mouth every morning. 90 tablet 3  . citalopram (CELEXA) 20 MG tablet TAKE ONE (1) TABLET EACH DAY 90 tablet 2  . cyclobenzaprine (FLEXERIL) 10 MG tablet Take 10 mg by mouth 3 (three) times daily as needed for muscle spasms.     Marland Kitchen dexlansoprazole (DEXILANT) 60 MG capsule Take 1 capsule (60 mg total) by mouth daily. 90 capsule 3  . diclofenac sodium (VOLTAREN) 1 % GEL Apply 1 application topically 2 (two) times daily as needed (pain).     Marland Kitchen dicyclomine (BENTYL) 10 MG capsule Take 1 capsule (10 mg total) by mouth 3 (three) times daily as needed for spasms. 90 capsule 0  . docusate sodium (COLACE) 100 MG capsule Take 100-200 mg by mouth daily as needed for mild constipation.     Marland Kitchen lisinopril (ZESTRIL) 10 MG tablet Take 1 tablet  (10 mg total) by mouth daily. 90 tablet 3  . mometasone (NASONEX) 50 MCG/ACT nasal spray USE 2 SPRAYS IN EACH NOSTRIL ONCE DAILY 51 g 2  . Multiple Vitamins-Minerals (CENTRUM SILVER PO) Take 1 tablet by mouth daily.    . ondansetron (ZOFRAN) 4 MG tablet Take 4 mg by mouth every 8 (eight) hours as needed for nausea or vomiting.     Marland Kitchen oxymorphone (OPANA) 5 MG tablet Take 5 mg by mouth 2 (two) times daily.     . SYMBICORT 160-4.5 MCG/ACT inhaler USE 2 PUFFS TWICE DAILY 10.2 g 2  . diazepam (VALIUM) 10 MG tablet Take 5 mg by mouth every 12 (twelve) hours as needed  for anxiety.     Marland Kitchen GAVILAX 17 GM/SCOOP powder MIX 17 GRAMS INTO 8OZ OF WATER AND DRINK DAILY 510 g 1  . montelukast (SINGULAIR) 10 MG tablet Take 1 tablet (10 mg total) by mouth at bedtime. 90 tablet 3    Results for orders placed or performed during the hospital encounter of 08/08/19 (from the past 48 hour(s))  SARS CORONAVIRUS 2 (TAT 6-24 HRS) Nasopharyngeal Nasopharyngeal Swab     Status: None   Collection Time: 08/08/19  1:37 PM   Specimen: Nasopharyngeal Swab  Result Value Ref Range   SARS Coronavirus 2 NEGATIVE NEGATIVE    Comment: (NOTE) SARS-CoV-2 target nucleic acids are NOT DETECTED.  The SARS-CoV-2 RNA is generally detectable in upper and lower respiratory specimens during the acute phase of infection. Negative results do not preclude SARS-CoV-2 infection, do not rule out co-infections with other pathogens, and should not be used as the sole basis for treatment or other patient management decisions. Negative results must be combined with clinical observations, patient history, and epidemiological information. The expected result is Negative.  Fact Sheet for Patients: SugarRoll.be  Fact Sheet for Healthcare Providers: https://www.woods-mathews.com/  This test is not yet approved or cleared by the [place] FDA and  has been authorized for detection and/or diagnosis of  SARS-CoV-2 by FDA under an Emergency Use Authorization (EUA). This EUA will remain  in effect (meaning this test can be used) for the duration of the COVID-19 declaration under Se ction 564(b)(1) of the Act, 21 U.S.C. section 360bbb-3(b)(1), unless the authorization is terminated or revoked sooner.  Performed at [facility], [place] [address] [place] [address], [place] [address]   CBC with Differential/Platelet     Status: None   Collection Time: 08/08/19  2:15 PM  Result Value Ref Range   WBC 5.6 4.0 - 10.5 K/uL   RBC 4.66 3.87 - 5.11 MIL/uL   Hemoglobin 13.2 12.0 - 15.0 g/dL   HCT 41.3 36 - 46 %   MCV 88.6 80.0 - 100.0 fL   MCH 28.3 26.0 - 34.0 pg   MCHC 32.0 30.0 - 36.0 g/dL   RDW 14.5 11.5 - 15.5 %   Platelets 161 150 - 400 K/uL   nRBC 0.0 0.0 - 0.2 %   Neutrophils Relative % 63 %   Neutro Abs 3.5 1.7 - 7.7 K/uL   Lymphocytes Relative 27 %   Lymphs Abs 1.5 0.7 - 4.0 K/uL   Monocytes Relative 7 %   Monocytes Absolute 0.4 0 - 1 K/uL   Eosinophils Relative 2 %   Eosinophils Absolute 0.1 0 - 0 K/uL   Basophils Relative 1 %   Basophils Absolute 0.0 0 - 0 K/uL   Immature Granulocytes 0 %   Abs Immature Granulocytes 0.02 0.00 - 0.07 K/uL    Comment: Performed at [facility], [address] [place], [place] [address]  Comprehensive metabolic panel     Status: Abnormal   Collection Time: 08/08/19  2:15 PM  Result Value Ref Range   Sodium 139 135 - 145 mmol/L   Potassium 4.3 3.5 - 5.1 mmol/L   Chloride 106 98 - 111 mmol/L   CO2 26 22 - 32 mmol/L   Glucose, Bld 98 70 - 99 mg/dL    Comment: Glucose reference range applies only to samples taken after fasting for at least 8 hours.   BUN 11 8 - 23 mg/dL   Creatinine, Ser 0.91 0.44 - 1.00 mg/dL   Calcium 8.6 (  L) 8.9 - 10.3 mg/dL   Total Protein 6.9 6.5 - 8.1 g/dL   Albumin 4.0 3.5 - 5.0 g/dL   AST 25 15 - 41 U/L   ALT 25 0 - 44 U/L   Alkaline Phosphatase 66 38 - 126 U/L   Total Bilirubin 1.1 0.3 - 1.2 mg/dL   GFR calc non Af  Amer >60 >60 mL/min   GFR calc Af Amer >60 >60 mL/min   Anion gap 7 5 - 15    Comment: Performed at [facility], [address] [place], [place] [address]  Protime-INR     Status: None   Collection Time: 08/08/19  2:15 PM  Result Value Ref Range   Prothrombin Time 12.8 11.4 - 15.2 seconds   INR 1.0 0.8 - 1.2    Comment: (NOTE) INR goal varies based on device and disease states. Performed at [facility], [address] [place], [place] [address]    No results found.  Review of Systems  Blood pressure 133/82, pulse 87, temperature 98.3 F (36.8 C), temperature source Oral, resp. rate 19, height 5\' 2"  (1.575 m), weight 69.4 kg, SpO2 96 %. Physical Exam  HENT:  Mouth/Throat: Mucous membranes are moist. Oropharynx is clear.  Eyes: Conjunctivae are normal. No scleral icterus.  Cardiovascular: Normal rate, regular rhythm and normal heart sounds.  Respiratory: Effort normal and breath sounds normal.  GI:  Abdomen is full.  On palpation it soft.  She has mild tenderness below the right costal margin and mid epigastric region.  She also has mild tenderness in hypogastric region.  No organomegaly or masses.  Musculoskeletal:        General: No swelling.     Cervical back: Neck supple.  Lymphadenopathy:    She has no cervical adenopathy.  Neurological: She is alert.  Skin: Skin is warm and dry.  Psychiatric: Her behavior is normal.     Assessment/Plan Esophageal dysphagia in patient with chronic GERD history of esophageal stricture and possibly esophageal motility disorder. Epigastric and right upper quadrant pain. Esophagogastroduodenoscopy with esophageal dilation under monitored anesthesia care.  Hildred Laser, MD 08/10/2019, 8:24 AM

## 2019-08-10 NOTE — Anesthesia Postprocedure Evaluation (Signed)
Anesthesia Post Note  Patient: Teresa [name]  Procedure(s) Performed: ESOPHAGOGASTRODUODENOSCOPY (EGD) WITH PROPOFOL (N/A ) ESOPHAGEAL DILATION (N/A ) BIOPSY  Patient location during evaluation: PACU Anesthesia Type: General Level of consciousness: awake, oriented, awake and alert and patient cooperative Pain management: satisfactory to patient Vital Signs Assessment: post-procedure vital signs reviewed and stable Respiratory status: spontaneous breathing, respiratory function stable and nonlabored ventilation Cardiovascular status: stable Postop Assessment: no apparent nausea or vomiting Anesthetic complications: no   No complications documented.   Last Vitals:  Vitals:   08/10/19 0814  BP: 133/82  Pulse: 87  Resp: 19  Temp: 36.8 C  SpO2: 96%    Last Pain:  Vitals:   08/10/19 0814  TempSrc: Oral  PainSc: 6                  Reyce Lubeck

## 2019-08-10 NOTE — Op Note (Signed)
[facility] Patient Name: Teresa [name] Procedure Date: 08/10/2019 8:59 AM MRN: [id number] Date of Birth: [birth year] Attending MD: Hildred Laser , MD CSN: [id number] Age: 64 Admit Type: Outpatient Procedure:                Upper GI endoscopy Indications:              Epigastric abdominal pain, Abdominal pain in the                            right upper quadrant, Esophageal dysphagia Providers:                Hildred Laser, MD, Lurline Del, RN, Randa Spike,                            Technician Referring MD:             Fransisca Kaufmann. Dettinger, MD Medicines:                Propofol per Anesthesia Complications:            No immediate complications. Estimated Blood Loss:     Estimated blood loss was minimal. Procedure:                Pre-Anesthesia Assessment:                           - Prior to the procedure, a History and Physical                            was performed, and patient medications and                            allergies were reviewed. The patient's tolerance of                            previous anesthesia was also reviewed. The risks                            and benefits of the procedure and the sedation                            options and risks were discussed with the patient.                            All questions were answered, and informed consent                            was obtained. Prior Anticoagulants: The patient has                            taken no previous anticoagulant or antiplatelet                            agents except for aspirin. ASA Grade Assessment:  III - A patient with severe systemic disease. After                            reviewing the risks and benefits, the patient was                            deemed in satisfactory condition to undergo the                            procedure.                           After obtaining informed consent, the endoscope was                            passed  under direct vision. Throughout the                            procedure, the patient's blood pressure, pulse, and                            oxygen saturations were monitored continuously. The                            [id number]-[id number] ([id number]) scope was introduced through the                            mouth, and advanced to the second part of duodenum.                            The upper GI endoscopy was accomplished without                            difficulty. The patient tolerated the procedure                            well. Scope In: 9:18:13 AM Scope Out: 9:27:03 AM Total Procedure Duration: 0 hours 8 minutes 50 seconds  Findings:      The hypopharynx was normal.      A web was found in the proximal esophagus. The scope was withdrawn.       Dilation was performed with a Maloney dilator with mild resistance at 14       Fr. The dilation site was examined following endoscope reinsertion and       showed mild mucosal disruption, moderate improvement in luminal       narrowing and no perforation.      The Z-line was irregular and was found 38 cm from the incisors.      A 2 cm hiatal hernia was present.      Patchy mild inflammation characterized by erythema and granularity was       found in the gastric antrum. Biopsies were taken with a cold forceps for       histology.      The exam of the stomach was otherwise normal.      The duodenal bulb and second  portion of the duodenum were normal. Impression:               - Normal hypopharynx.                           - Web in the proximal esophagus. Dilated.                           - Z-line irregular, 38 cm from the incisors.                           - 2 cm hiatal hernia.                           - Gastritis. Biopsied.                           - Normal duodenal bulb and second portion of the                            duodenum. Moderate Sedation:      Per Anesthesia Care Recommendation:           - Resume previous diet today.                            - Continue present medications.                           - No aspirin, ibuprofen, naproxen, or other                            non-steroidal anti-inflammatory drugs for 1 day.                           - Await pathology results. Procedure Code(s):        --- Professional ---                           [id number], Esophagogastroduodenoscopy, flexible,                            transoral; with biopsy, single or multiple                           43450, Dilation of esophagus, by unguided sound or                            bougie, single or multiple passes Diagnosis Code(s):        --- Professional ---                           Q39.4, Esophageal web                           K22.8, Other specified diseases of esophagus  K44.9, Diaphragmatic hernia without obstruction or                            gangrene                           K29.70, Gastritis, unspecified, without bleeding                           R10.13, Epigastric pain                           R10.11, Right upper quadrant pain                           R13.14, Dysphagia, pharyngoesophageal phase CPT copyright 2019 American Medical Association. All rights reserved. The codes documented in this report are preliminary and upon coder review may  be revised to meet current compliance requirements. Hildred Laser, MD Hildred Laser, MD 08/10/2019 9:36:52 AM This report has been signed electronically. Number of Addenda: 0

## 2019-08-10 NOTE — Transfer of Care (Signed)
Immediate Anesthesia Transfer of Care Note  Patient: Teresa [name]  Procedure(s) Performed: ESOPHAGOGASTRODUODENOSCOPY (EGD) WITH PROPOFOL (N/A ) ESOPHAGEAL DILATION (N/A ) BIOPSY  Patient Location: PACU  Anesthesia Type:General  Level of Consciousness: awake, alert , oriented and patient cooperative  Airway & Oxygen Therapy: Patient Spontanous Breathing  Post-op Assessment: Report given to RN, Post -op Vital signs reviewed and stable and Patient moving all extremities X 4  Post vital signs: Reviewed and stable  Last Vitals:  Vitals Value Taken Time  BP    Temp    Pulse    Resp    SpO2      Last Pain:  Vitals:   08/10/19 0814  TempSrc: Oral  PainSc: 6       Patients Stated Pain Goal: 6 (97/84/78 4128)  Complications: No complications documented.

## 2019-08-13 ENCOUNTER — Other Ambulatory Visit: Payer: Self-pay

## 2019-08-13 LAB — SURGICAL PATHOLOGY

## 2019-08-14 ENCOUNTER — Encounter (HOSPITAL_COMMUNITY): Payer: Self-pay | Admitting: Internal Medicine

## 2019-08-20 DIAGNOSIS — H52223 Regular astigmatism, bilateral: Secondary | ICD-10-CM | POA: Diagnosis not present

## 2019-08-20 DIAGNOSIS — H524 Presbyopia: Secondary | ICD-10-CM | POA: Diagnosis not present

## 2019-08-30 ENCOUNTER — Other Ambulatory Visit: Payer: Self-pay | Admitting: Family Medicine

## 2019-08-30 ENCOUNTER — Ambulatory Visit (INDEPENDENT_AMBULATORY_CARE_PROVIDER_SITE_OTHER): Payer: Medicare Other | Admitting: Gastroenterology

## 2019-08-30 DIAGNOSIS — Z79891 Long term (current) use of opiate analgesic: Secondary | ICD-10-CM | POA: Diagnosis not present

## 2019-08-30 DIAGNOSIS — F339 Major depressive disorder, recurrent, unspecified: Secondary | ICD-10-CM

## 2019-08-30 DIAGNOSIS — M542 Cervicalgia: Secondary | ICD-10-CM | POA: Diagnosis not present

## 2019-08-30 DIAGNOSIS — M25569 Pain in unspecified knee: Secondary | ICD-10-CM | POA: Diagnosis not present

## 2019-08-30 DIAGNOSIS — M545 Low back pain: Secondary | ICD-10-CM | POA: Diagnosis not present

## 2019-08-31 NOTE — Telephone Encounter (Signed)
30 day supply given needs a follow up appointment.

## 2019-09-07 ENCOUNTER — Other Ambulatory Visit: Payer: Self-pay | Admitting: Family Medicine

## 2019-09-24 ENCOUNTER — Other Ambulatory Visit (INDEPENDENT_AMBULATORY_CARE_PROVIDER_SITE_OTHER): Payer: Self-pay | Admitting: Internal Medicine

## 2019-09-30 ENCOUNTER — Other Ambulatory Visit: Payer: Self-pay | Admitting: Family Medicine

## 2019-09-30 DIAGNOSIS — J4540 Moderate persistent asthma, uncomplicated: Secondary | ICD-10-CM

## 2019-09-30 DIAGNOSIS — F339 Major depressive disorder, recurrent, unspecified: Secondary | ICD-10-CM

## 2019-10-01 NOTE — Telephone Encounter (Signed)
Dettinger. NTBS 30 days given 08/31/19

## 2019-10-01 NOTE — Telephone Encounter (Signed)
Appt made

## 2019-10-16 ENCOUNTER — Encounter: Payer: Self-pay | Admitting: Family Medicine

## 2019-10-16 ENCOUNTER — Ambulatory Visit (INDEPENDENT_AMBULATORY_CARE_PROVIDER_SITE_OTHER): Payer: Medicare Other | Admitting: Family Medicine

## 2019-10-16 DIAGNOSIS — I10 Essential (primary) hypertension: Secondary | ICD-10-CM

## 2019-10-16 DIAGNOSIS — K219 Gastro-esophageal reflux disease without esophagitis: Secondary | ICD-10-CM | POA: Diagnosis not present

## 2019-10-16 DIAGNOSIS — R7303 Prediabetes: Secondary | ICD-10-CM

## 2019-10-16 MED ORDER — GUAIFENESIN-DM 100-10 MG/5ML PO SYRP
5.0000 mL | ORAL_SOLUTION | ORAL | 0 refills | Status: DC | PRN
Start: 2019-10-16 — End: 2019-12-03

## 2019-10-16 NOTE — Progress Notes (Signed)
Virtual Visit via telephone Note  I connected with Teresa [name] on 10/16/19 at 1224 by telephone and verified that I am speaking with the correct person using two identifiers. Teresa [name] is currently located at home and no other people are currently with her during visit. The provider, Fransisca Kaufmann Kenya Kook, MD is located in their office at time of visit.  Call ended at 1235  I discussed the limitations, risks, security and privacy concerns of performing an evaluation and management service by telephone and the availability of in person appointments. I also discussed with the patient that there may be a patient responsible charge related to this service. The patient expressed understanding and agreed to proceed.   History and Present Illness: Prediabetes Patient comes in today for recheck of his diabetes. Patient has been currently taking no medicine and diet controlled. Patient is currently on an ACE inhibitor/ARB. Patient has seen an ophthalmologist this year. Patient denies any issues with their feet. The symptom started onset as an adult htn and gerd ARE RELATED TO DM   Hypertension Patient is currently on lisinopril, and their blood pressure today is unknown. Patient denies any lightheadedness or dizziness. Patient denies headaches, blurred vision, chest pains, shortness of breath, or weakness. Denies any side effects from medication and is content with current medication.   GERD Patient is currently on dexilant.  She denies any major symptoms or abdominal pain or belching or burping. She denies any blood in her stool or lightheadedness or dizziness.   No diagnosis found.  Outpatient Encounter Medications as of 10/16/2019  Medication Sig  . albuterol (PROVENTIL) (2.5 MG/3ML) 0.083% nebulizer solution NEBULIZE 1 VIAL EVERY 6 HOURS AS NEEDED FOR SHORTNESS OF BREATH  . albuterol (VENTOLIN HFA) 108 (90 Base) MCG/ACT inhaler USE 1 TO 2 PUFFS EVERY 4 TO 6 HOURS AS NEEDED  . buPROPion  (WELLBUTRIN XL) 300 MG 24 hr tablet TAKE ONE (1) TABLET EACH DAY  . cetirizine (ZYRTEC) 10 MG tablet Take 1 tablet (10 mg total) by mouth every morning.  . citalopram (CELEXA) 20 MG tablet TAKE ONE (1) TABLET EACH DAY  . cyclobenzaprine (FLEXERIL) 10 MG tablet Take 10 mg by mouth 3 (three) times daily as needed for muscle spasms.   Marland Kitchen dexlansoprazole (DEXILANT) 60 MG capsule Take 1 capsule (60 mg total) by mouth daily.  . diazepam (VALIUM) 10 MG tablet Take 5 mg by mouth every 12 (twelve) hours as needed for anxiety.   . diclofenac sodium (VOLTAREN) 1 % GEL Apply 1 application topically 2 (two) times daily as needed (pain).   Marland Kitchen dicyclomine (BENTYL) 10 MG capsule TAKE ONE (1) CAPSULE THREE (3) TIMES EACH DAY AS NEEDED  . docusate sodium (COLACE) 100 MG capsule Take 100-200 mg by mouth daily as needed for mild constipation.   Marland Kitchen GAVILAX 17 GM/SCOOP powder MIX 17 GRAMS INTO 8OZ OF WATER AND DRINK DAILY  . lisinopril (ZESTRIL) 10 MG tablet Take 1 tablet (10 mg total) by mouth daily.  . mometasone (NASONEX) 50 MCG/ACT nasal spray USE 2 SPRAYS IN EACH NOSTRIL ONCE DAILY  . montelukast (SINGULAIR) 10 MG tablet Take 1 tablet (10 mg total) by mouth at bedtime.  . Multiple Vitamins-Minerals (CENTRUM SILVER PO) Take 1 tablet by mouth daily.  . ondansetron (ZOFRAN) 4 MG tablet Take 4 mg by mouth every 8 (eight) hours as needed for nausea or vomiting.   Marland Kitchen oxymorphone (OPANA) 5 MG tablet Take 5 mg by mouth 2 (two) times daily.   Marland Kitchen  SYMBICORT 160-4.5 MCG/ACT inhaler USE 2 PUFFS TWICE DAILY   No facility-administered encounter medications on file as of 10/16/2019.    Review of Systems  Constitutional: Negative for chills and fever.  Eyes: Negative for visual disturbance.  Respiratory: Negative for chest tightness and shortness of breath.   Cardiovascular: Negative for chest pain and leg swelling.  Musculoskeletal: Negative for back pain and gait problem.  Skin: Negative for rash.  Neurological: Negative for  light-headedness and headaches.  Psychiatric/Behavioral: Negative for agitation and behavioral problems.  All other systems reviewed and are negative.   Observations/Objective: Patient sounds comfortable and in no acute distress  Assessment and Plan: Problem List Items Addressed This Visit      Cardiovascular and Mediastinum   HTN (hypertension)     Digestive   Gastroesophageal reflux disease without esophagitis     Other   Prediabetes - Primary      Continue current medication, will see if we can get her in next time for blood work. She had a cough today so was unable to come in, send a cough medicine for her. Follow up plan: Return in about 3 months (around 01/16/2020), or if symptoms worsen or fail to improve, for Hypertension and prediabetes recheck.     I discussed the assessment and treatment plan with the patient. The patient was provided an opportunity to ask questions and all were answered. The patient agreed with the plan and demonstrated an understanding of the instructions.   The patient was advised to call back or seek an in-person evaluation if the symptoms worsen or if the condition fails to improve as anticipated.  The above assessment and management plan was discussed with the patient. The patient verbalized understanding of and has agreed to the management plan. Patient is aware to call the clinic if symptoms persist or worsen. Patient is aware when to return to the clinic for a follow-up visit. Patient educated on when it is appropriate to go to the emergency department.    I provided 11 minutes of non-face-to-face time during this encounter.    Worthy Rancher, MD

## 2019-10-31 ENCOUNTER — Other Ambulatory Visit: Payer: Self-pay | Admitting: Family Medicine

## 2019-11-01 ENCOUNTER — Other Ambulatory Visit: Payer: Self-pay | Admitting: Family Medicine

## 2019-11-01 NOTE — Telephone Encounter (Signed)
I do not see citalopram on medication list. It looks like it was discontinued on 08/17

## 2019-11-08 ENCOUNTER — Other Ambulatory Visit (INDEPENDENT_AMBULATORY_CARE_PROVIDER_SITE_OTHER): Payer: Self-pay | Admitting: Gastroenterology

## 2019-11-13 ENCOUNTER — Encounter: Payer: Self-pay | Admitting: Family Medicine

## 2019-11-13 ENCOUNTER — Other Ambulatory Visit: Payer: Self-pay

## 2019-11-13 ENCOUNTER — Ambulatory Visit (INDEPENDENT_AMBULATORY_CARE_PROVIDER_SITE_OTHER): Payer: Medicare Other | Admitting: Family Medicine

## 2019-11-13 DIAGNOSIS — J069 Acute upper respiratory infection, unspecified: Secondary | ICD-10-CM

## 2019-11-13 NOTE — Progress Notes (Signed)
Virtual Visit via telephone Note  I connected with Teresa [name] on 11/13/19 at  1708 by telephone and verified that I am speaking with the correct person using two identifiers. Teresa [name] is currently located at home and patient are currently with her during visit. The provider, Fransisca Kaufmann Kilani Joffe, MD is located in their office at time of visit.  Call ended at 1714  I discussed the limitations, risks, security and privacy concerns of performing an evaluation and management service by telephone and the availability of in person appointments. I also discussed with the patient that there may be a patient responsible charge related to this service. The patient expressed understanding and agreed to proceed.   History and Present Illness: Patient has been having cough and drainage and body aches and nausea.  This has been going on for 3 days. She denies any wheezing or short of breath. She was around her nephew that had covid.     Outpatient Encounter Medications as of 11/13/2019  Medication Sig  . albuterol (PROVENTIL) (2.5 MG/3ML) 0.083% nebulizer solution NEBULIZE 1 VIAL EVERY 6 HOURS AS NEEDED FOR SHORTNESS OF BREATH  . albuterol (VENTOLIN HFA) 108 (90 Base) MCG/ACT inhaler USE 1 TO 2 PUFFS EVERY 4 TO 6 HOURS AS NEEDED  . buPROPion (WELLBUTRIN XL) 300 MG 24 hr tablet TAKE ONE (1) TABLET EACH DAY  . cetirizine (ZYRTEC) 10 MG tablet TAKE ONE TABLET EVERY MORNING  . citalopram (CELEXA) 20 MG tablet TAKE ONE (1) TABLET EACH DAY  . clonazePAM (KLONOPIN) 0.5 MG tablet Take 0.5 mg by mouth 3 (three) times daily as needed.  . cyclobenzaprine (FLEXERIL) 10 MG tablet Take 10 mg by mouth 3 (three) times daily as needed for muscle spasms.   Marland Kitchen dexlansoprazole (DEXILANT) 60 MG capsule Take 1 capsule (60 mg total) by mouth daily.  . diclofenac sodium (VOLTAREN) 1 % GEL Apply 1 application topically 2 (two) times daily as needed (pain).   Marland Kitchen dicyclomine (BENTYL) 10 MG capsule TAKE ONE (1) CAPSULE  THREE (3) TIMES EACH DAY AS NEEDED  . docusate sodium (COLACE) 100 MG capsule Take 100-200 mg by mouth daily as needed for mild constipation.   Marland Kitchen GAVILAX 17 GM/SCOOP powder MIX 17 GRAMS INTO 8OZ OF WATER AND DRINK DAILY  . guaiFENesin-dextromethorphan (ROBITUSSIN DM) 100-10 MG/5ML syrup Take 5 mLs by mouth every 4 (four) hours as needed for cough.  Marland Kitchen lisinopril (ZESTRIL) 10 MG tablet Take 1 tablet (10 mg total) by mouth daily.  . mometasone (NASONEX) 50 MCG/ACT nasal spray USE 2 SPRAYS IN EACH NOSTRIL ONCE DAILY  . montelukast (SINGULAIR) 10 MG tablet Take 1 tablet (10 mg total) by mouth at bedtime.  . Multiple Vitamins-Minerals (CENTRUM SILVER PO) Take 1 tablet by mouth daily.  . ondansetron (ZOFRAN) 4 MG tablet Take 4 mg by mouth every 8 (eight) hours as needed for nausea or vomiting.   Marland Kitchen oxymorphone (OPANA) 5 MG tablet Take 10 mg by mouth 2 (two) times daily.  . SYMBICORT 160-4.5 MCG/ACT inhaler USE 2 PUFFS TWICE DAILY   No facility-administered encounter medications on file as of 11/13/2019.    Review of Systems  Constitutional: Negative for chills and fever.  HENT: Positive for congestion, postnasal drip, rhinorrhea, sinus pressure, sneezing and sore throat. Negative for ear discharge and ear pain.   Eyes: Negative for pain, redness and visual disturbance.  Respiratory: Positive for cough. Negative for chest tightness, shortness of breath and wheezing.   Cardiovascular: Negative for chest  pain and leg swelling.  Genitourinary: Negative for difficulty urinating and dysuria.  Musculoskeletal: Positive for myalgias. Negative for back pain and gait problem.  Skin: Negative for rash.  Neurological: Negative for light-headedness and headaches.  Psychiatric/Behavioral: Negative for agitation and behavioral problems.  All other systems reviewed and are negative.   Observations/Objective: Patient sounds comfortable and In no acute distress  Assessment and Plan: Problem List Items  Addressed This Visit    None    Visit Diagnoses    Viral upper respiratory infection    -  Primary   Relevant Orders   Novel Coronavirus, NAA ([facility])      Recommended coming tomorrow for covid testing.  And quarantine and continue with her COPD treatments, call us if anything worsens. Follow up plan: Return if symptoms worsen or fail to improve.     I discussed the assessment and treatment plan with the patient. The patient was provided an opportunity to ask questions and all were answered. The patient agreed with the plan and demonstrated an understanding of the instructions.   The patient was advised to call back or seek an in-person evaluation if the symptoms worsen or if the condition fails to improve as anticipated.  The above assessment and management plan was discussed with the patient. The patient verbalized understanding of and has agreed to the management plan. Patient is aware to call the clinic if symptoms persist or worsen. Patient is aware when to return to the clinic for a follow-up visit. Patient educated on when it is appropriate to go to the emergency department.    I provided 6 minutes of non-face-to-face time during this encounter.    Worthy Rancher, MD   Hello this is Dr. Warrick Parisian ROS

## 2019-11-14 ENCOUNTER — Other Ambulatory Visit: Payer: Medicare Other

## 2019-11-14 DIAGNOSIS — J069 Acute upper respiratory infection, unspecified: Secondary | ICD-10-CM | POA: Diagnosis not present

## 2019-11-14 NOTE — Addendum Note (Signed)
Addended by: Earlene Plater on: 11/14/2019 10:31 AM   Modules accepted: Orders

## 2019-11-16 LAB — SARS-COV-2, NAA 2 DAY TAT

## 2019-11-16 LAB — NOVEL CORONAVIRUS, NAA: SARS-CoV-2, NAA: NOT DETECTED

## 2019-11-20 ENCOUNTER — Telehealth: Payer: Self-pay | Admitting: Family Medicine

## 2019-11-20 NOTE — Telephone Encounter (Signed)
Pt informed of negative test results

## 2019-11-21 ENCOUNTER — Telehealth: Payer: Self-pay | Admitting: Family Medicine

## 2019-11-21 NOTE — Telephone Encounter (Signed)
  Incoming Patient Call  11/21/2019  What symptoms do you have? Sinus drainage, green mucus and facial pressure and eyes and head hurt  How long have you been sick? Since last week   Have you been seen for this problem? Yes televisit last week tested negative for COVID  If your provider decides to give you a prescription, which pharmacy would you like for it to be sent to? [facility]    Patient informed that this information will be sent to the clinical staff for review and that they should receive a follow up call.

## 2019-11-22 MED ORDER — AMOXICILLIN 500 MG PO CAPS
500.0000 mg | ORAL_CAPSULE | Freq: Two times a day (BID) | ORAL | 0 refills | Status: DC
Start: 2019-11-22 — End: 2019-12-03

## 2019-11-22 NOTE — Telephone Encounter (Signed)
I sent amoxicillin in for the patient, office will have her use Flonase

## 2019-11-22 NOTE — Telephone Encounter (Signed)
Called patient, no answer, left detailed voice mail 

## 2019-11-27 ENCOUNTER — Ambulatory Visit: Payer: Medicare Other | Admitting: Nurse Practitioner

## 2019-11-29 DIAGNOSIS — M542 Cervicalgia: Secondary | ICD-10-CM | POA: Diagnosis not present

## 2019-11-29 DIAGNOSIS — G5600 Carpal tunnel syndrome, unspecified upper limb: Secondary | ICD-10-CM | POA: Diagnosis not present

## 2019-11-29 DIAGNOSIS — Z79891 Long term (current) use of opiate analgesic: Secondary | ICD-10-CM | POA: Diagnosis not present

## 2019-11-29 DIAGNOSIS — M25569 Pain in unspecified knee: Secondary | ICD-10-CM | POA: Diagnosis not present

## 2019-11-29 DIAGNOSIS — M545 Low back pain: Secondary | ICD-10-CM | POA: Diagnosis not present

## 2019-11-30 ENCOUNTER — Other Ambulatory Visit: Payer: Self-pay | Admitting: Family Medicine

## 2019-11-30 DIAGNOSIS — F331 Major depressive disorder, recurrent, moderate: Secondary | ICD-10-CM

## 2019-12-03 ENCOUNTER — Encounter: Payer: Self-pay | Admitting: Family Medicine

## 2019-12-03 ENCOUNTER — Ambulatory Visit (INDEPENDENT_AMBULATORY_CARE_PROVIDER_SITE_OTHER): Payer: Medicare Other | Admitting: Family Medicine

## 2019-12-03 ENCOUNTER — Other Ambulatory Visit: Payer: Self-pay

## 2019-12-03 VITALS — BP 136/85 | HR 108 | Temp 98.5°F | Ht 62.0 in | Wt 151.5 lb

## 2019-12-03 DIAGNOSIS — L989 Disorder of the skin and subcutaneous tissue, unspecified: Secondary | ICD-10-CM

## 2019-12-03 DIAGNOSIS — R21 Rash and other nonspecific skin eruption: Secondary | ICD-10-CM | POA: Diagnosis not present

## 2019-12-03 MED ORDER — PERMETHRIN 5 % EX CREA: 1.0000 "application " | TOPICAL_CREAM | Freq: Once | CUTANEOUS | 0 refills | Status: AC

## 2019-12-03 NOTE — Patient Instructions (Signed)
Scabies, Adult  Scabies is a skin condition that happens when very small insects get under the skin (infestation). This causes a rash and severe itchiness. Scabies can spread from person to person (is contagious). If you get scabies, it is common for others in your household to get scabies too. With proper treatment, symptoms usually go away in 2-4 weeks. Scabies usually does not cause lasting problems. What are the causes? This condition is caused by tiny mites (Sarcoptes scabiei, or human itch mites) that can only be seen with a microscope. The mites get into the top layer of skin and lay eggs. Scabies can spread from person to person through:  Close contact with a person who has scabies.  Sharing or having contact with infested items, such as towels, bedding, or clothing. What increases the risk? The following factors may make you more likely to develop this condition:  Living in a nursing home or other extended care facility.  Having sexual contact with a partner who has scabies.  Caring for others who are at increased risk for scabies. What are the signs or symptoms? Symptoms of this condition include:  Severe itchiness. This is often worse at night.  A rash that includes tiny red bumps or blisters. The rash commonly occurs on the hands, wrists, elbows, armpits, chest, waist, groin, or buttocks. The bumps may form a line (burrow) in some areas.  Skin irritation. This can include scaly patches or sores. How is this diagnosed? This condition may be diagnosed based on:  A physical exam of the skin.  A skin test. Your health care provider may take a sample of your affected skin (skin scraping) and have it examined under a microscope for signs of mites. How is this treated? This condition may be treated with:  Medicated cream or lotion that kills the mites. This is spread on the entire body and left on for several hours. Usually, one treatment with medicated cream or lotion is  enough to kill all the mites. In severe cases, the treatment may need to be repeated.  Medicated cream that relieves itching.  Medicines taken by mouth (orally) that: ? Relieve itching. ? Reduce the swelling and redness. ? Kill the mites. This treatment may be done in severe cases. Follow these instructions at home: Medicines   Take or apply over-the-counter and prescription medicines as told by your health care provider.  Apply medicated cream or lotion as told by your health care provider.  Do not wash off the medicated cream or lotion until the necessary amount of time has passed. Skin care   Avoid scratching the affected areas of your skin.  Keep your fingernails closely trimmed to reduce injury from scratching.  Take cool baths or apply cool washcloths to your skin to help reduce itching. General instructions  Clean all items that you recently had contact with, including bedding, clothing, and furniture. Do this on the same day that you start treatment. ? Dry clean items, or use hot water to wash items. Dry items on the hot dry cycle. ? Place items that cannot be washed into closed, airtight plastic bags for at least 3 days. The mites cannot live for more than 3 days away from human skin. ? Vacuum furniture and mattresses that you use.  Make sure that other people who may have been infested are examined by a health care provider. These include members of your household and anyone who may have had contact with infested items.  Keep all follow-up  visits as told by your health care provider. This is important. Contact a health care provider if:  You have itching that does not go away after 4 weeks of treatment.  You continue to develop new bumps or burrows.  You have redness, swelling, or pain in your rash area after treatment.  You have fluid, blood, or pus coming from your rash. Summary  Scabies is a skin condition that causes a rash and severe itchiness.  This  condition is caused by tiny mites that get into the top layer of the skin and lay eggs.  Scabies can spread from person to person.  Follow treatments as recommended by your health care provider.  Clean all items that you recently had contact with. This information is not intended to replace advice given to you by your health care provider. Make sure you discuss any questions you have with your health care provider. Document Revised: 12/21/2017 Document Reviewed: 12/21/2017 [employer] Patient Education  2020 [employer]  

## 2019-12-03 NOTE — Progress Notes (Signed)
Subjective: CC: rash PCP: Dettinger, Fransisca Kaufmann, MD  YPP:JKDTO Teresa [name] is a 64 y.o. female presenting to clinic today for:  1. Rash Teresa [name] reports spots on her arms and and hands for the last 2 weeks. She has had scabies last year and reports this is the same. The areas are red and a little itchy. She has picked at them and reports "picking out mites".  She has tried alcohol, peroxide, and hot showers without much relief. Hydrocortisone has provided some relief. She denies drainage or fever. She reports that her sister also has similar symptoms. She reports that she previously had a referral to dermatology for this rash but the location did not accept her insurance so she was not seen.   2. Lesion on nose Teresa [name] reports a lesion on the tip of her nose that has been present for many years. It has become larger and irritated over the last few months. Denies drainage or bleeding. She denies unintended weight loss, fatigue, or night sweats.    Relevant past medical, surgical, family, and social history reviewed and updated as indicated.  Allergies and medications reviewed and updated.  Allergies  Allergen Reactions  . Lyrica [Pregabalin] Shortness Of Breath  . Trazodone And Nefazodone Shortness Of Breath  . Amitriptyline   . Arthrotec [Diclofenac-Misoprostol] Other (See Comments)    Upset stomach  . Duloxetine   . Keflex [Cephalexin] Other (See Comments)    Unknown reaction  . Naproxen Other (See Comments)    Irritates stomach  . Other   . Prozac [Fluoxetine Hcl] Other (See Comments)    "makes me feel bad"  . Remeron [Mirtazapine] Other (See Comments)    "off balance"  . Zolpidem Other (See Comments)    Other  . Ambien [Zolpidem Tartrate] Other (See Comments)    COULDN'T FUNCTION, STAYED SLEEPY   Past Medical History:  Diagnosis Date  . Anxiety state, unspecified   . Asthma    prn inhaler  . Chronic back pain greater than 3 months duration   . Colon polyp   . COPD (chronic  obstructive pulmonary disease) ([place])   . Degeneration of intervertebral disc, site unspecified   . Depressive disorder, not elsewhere classified   . Diverticulitis   . Diverticulosis 07-08-2005   colonoscopy  . Esophageal stricture 2014  . Full dentures   . GERD (gastroesophageal reflux disease) 07-08-2005   EGD  . Hepatitis C   . Hiatal hernia 07-08-2005   EGD  . Hypertension   . Insomnia   . Irritable bowel syndrome   . Migraine   . Muscle spasm   . Osteoarthrosis, unspecified whether generalized or localized, lower leg   . Papilloma of breast 09/2011   left  . Seasonal allergies   . Seizures ([place]) 1990s   x 1, unknown cause; no seizures since; no meds, unknown etiology  . Vocal cord polyp    history of    Current Outpatient Medications:  .  albuterol (PROVENTIL) (2.5 MG/3ML) 0.083% nebulizer solution, NEBULIZE 1 VIAL EVERY 6 HOURS AS NEEDED FOR SHORTNESS OF BREATH, Disp: 150 mL, Rfl: 0 .  albuterol (VENTOLIN HFA) 108 (90 Base) MCG/ACT inhaler, USE 1 TO 2 PUFFS EVERY 4 TO 6 HOURS AS NEEDED, Disp: 25.5 g, Rfl: 4 .  buPROPion (WELLBUTRIN XL) 300 MG 24 hr tablet, TAKE ONE (1) TABLET EACH DAY, Disp: 90 tablet, Rfl: 0 .  cetirizine (ZYRTEC) 10 MG tablet, TAKE ONE TABLET EVERY MORNING, Disp: 90 tablet, Rfl: 3 .  citalopram (CELEXA) 20 MG tablet, TAKE ONE (1) TABLET EACH DAY, Disp: 30 tablet, Rfl: 2 .  clonazePAM (KLONOPIN) 0.5 MG tablet, Take 0.5 mg by mouth 3 (three) times daily as needed., Disp: , Rfl:  .  cyclobenzaprine (FLEXERIL) 10 MG tablet, Take 10 mg by mouth 3 (three) times daily as needed for muscle spasms. , Disp: , Rfl:  .  dexlansoprazole (DEXILANT) 60 MG capsule, Take 1 capsule (60 mg total) by mouth daily., Disp: 90 capsule, Rfl: 3 .  diclofenac sodium (VOLTAREN) 1 % GEL, Apply 1 application topically 2 (two) times daily as needed (pain). , Disp: , Rfl:  .  dicyclomine (BENTYL) 10 MG capsule, TAKE ONE (1) CAPSULE THREE (3) TIMES EACH DAY AS NEEDED, Disp: 90 capsule, Rfl:  0 .  docusate sodium (COLACE) 100 MG capsule, Take 100-200 mg by mouth daily as needed for mild constipation. , Disp: , Rfl:  .  GAVILAX 17 GM/SCOOP powder, MIX 17 GRAMS INTO 8OZ OF WATER AND DRINK DAILY, Disp: 510 g, Rfl: 1 .  lisinopril (ZESTRIL) 10 MG tablet, Take 1 tablet (10 mg total) by mouth daily., Disp: 90 tablet, Rfl: 3 .  mometasone (NASONEX) 50 MCG/ACT nasal spray, USE 2 SPRAYS IN EACH NOSTRIL ONCE DAILY, Disp: 51 g, Rfl: 2 .  montelukast (SINGULAIR) 10 MG tablet, Take 1 tablet (10 mg total) by mouth at bedtime., Disp: 90 tablet, Rfl: 3 .  Multiple Vitamins-Minerals (CENTRUM SILVER PO), Take 1 tablet by mouth daily., Disp: , Rfl:  .  ondansetron (ZOFRAN) 4 MG tablet, Take 4 mg by mouth every 8 (eight) hours as needed for nausea or vomiting. , Disp: , Rfl:  .  oxymorphone (OPANA) 10 MG tablet, Take 10 mg by mouth 3 (three) times daily as needed., Disp: , Rfl:  .  SYMBICORT 160-4.5 MCG/ACT inhaler, USE 2 PUFFS TWICE DAILY, Disp: 10.2 g, Rfl: 2 Social History   Socioeconomic History  . Marital status: Divorced    Spouse name: Not on file  . Number of children: 1  . Years of education: GED  . Highest education level: Not on file  Occupational History  . Occupation: Disabled  Tobacco Use  . Smoking status: Former Smoker    Packs/day: 1.50    Years: 0.50    Pack years: 0.75    Types: Cigarettes    Quit date: 08/20/1991    Years since quitting: 28.3  . Smokeless tobacco: Never Used  Vaping Use  . Vaping Use: Never used  Substance and Sexual Activity  . Alcohol use: No    Alcohol/week: 0.0 standard drinks    Comment: quit 26 yrs ago, 04/21/18 per pt  . Drug use: Not Currently    Comment: quit 26 yrs ago. Cocaine and marijuana, 04/21/18 yrs ago  . Sexual activity: Not Currently  Other Topics Concern  . Not on file  Social History Narrative   Lives in an apartment. Her oldest granddaughter lives with her.   Social Determinants of Health   Financial Resource Strain: Low  Risk   . Difficulty of Paying Living Expenses: Not hard at all  Food Insecurity: Food Insecurity Present  . Worried About Charity fundraiser in the Last Year: Sometimes true  . Ran Out of Food in the Last Year: Sometimes true  Transportation Needs: No Transportation Needs  . Lack of Transportation (Medical): No  . Lack of Transportation (Non-Medical): No  Physical Activity: Insufficiently Active  . Days of Exercise per Week: 3 days  .  Minutes of Exercise per Session: 10 min  Stress: Stress Concern Present  . Feeling of Stress : To some extent  Social Connections: Moderately Isolated  . Frequency of Communication with Friends and Family: Three times a week  . Frequency of Social Gatherings with Friends and Family: Three times a week  . Attends Religious Services: More than 4 times per year  . Active Member of Clubs or Organizations: No  . Attends Archivist Meetings: Never  . Marital Status: Divorced  Human resources officer Violence: Not At Risk  . Fear of Current or Ex-Partner: No  . Emotionally Abused: No  . Physically Abused: No  . Sexually Abused: No   Family History  Problem Relation Age of Onset  . Prostate cancer Father   . Sleep apnea Father   . Stomach cancer Maternal Uncle   . Diabetes Maternal Grandmother   . Stroke Maternal Grandmother   . Heart disease Paternal Grandmother   . Irritable bowel syndrome Other   . Stroke Maternal Grandfather     Review of Systems  Per HPI.   Objective: Office vital signs reviewed. BP 136/85   Pulse (!) 108   Temp 98.5 F (36.9 C) (Temporal)   Ht 5\' 2"  (1.575 m)   Wt 151 lb 8 oz (68.7 kg)   BMI 27.71 kg/m   Physical Examination:  Physical Exam Vitals and nursing note reviewed.  Constitutional:      General: She is not in acute distress.    Appearance: Normal appearance. She is not ill-appearing.  Skin:    General: Skin is warm and dry.     Comments: Scattered small erythematous flat area with central scabbing  on bilateral arms. No rash noted on hand. Erythematous raised lesion with irregular borders on tip of nose.   Neurological:     General: No focal deficit present.     Mental Status: She is alert and oriented to person, place, and time.  Psychiatric:        Mood and Affect: Mood normal.        Behavior: Behavior normal.      Results for orders placed or performed in visit on 11/13/19  Novel Coronavirus, NAA ([facility])   Specimen: Nasopharyngeal(NP) swabs in vial transport medium  Result Value Ref Range   SARS-CoV-2, NAA Not Detected Not Detected  SARS-COV-2, NAA 2 DAY TAT  Result Value Ref Range   SARS-CoV-2, NAA 2 DAY TAT Performed      Assessment/ Plan: Teresa [name] was seen today for pruritis.  Diagnoses and all orders for this visit:  Rash No burrows noted on exam. Exam finding consist with her report of picking at her skin. Will treat for scabies since it has helped her in the past. New referral placed for further assessment of ongoing rash.   -     Ambulatory referral to Dermatology -     permethrin (ELIMITE) 5 % cream; Apply 1 application topically once for 1 dose.  Lesion of skin of nose -     Ambulatory referral to Dermatology   The above assessment and management plan was discussed with the patient. The patient verbalized understanding of and has agreed to the management plan. Patient is aware to call the clinic if symptoms persist or worsen. Patient is aware when to return to the clinic for a follow-up visit. Patient educated on when it is appropriate to go to the emergency department.   Marjorie Smolder, FNP-C [facility] [address]  [place], [place] [address] [phone]

## 2019-12-11 ENCOUNTER — Ambulatory Visit: Payer: Self-pay | Admitting: *Deleted

## 2019-12-11 NOTE — Chronic Care Management (AMB) (Signed)
  Chronic Care Management   Note  12/11/2019 Name: Teresa [name] MRN: [id number] DOB: 1955-09-01  Removed from CCM program due to non-participation.    Follow up plan: Patient can be added back to the CCM program at anytime she or her provided desires  Chong Sicilian, BSN, RN-BC [facility] Direct Dial: [phone]

## 2019-12-31 ENCOUNTER — Other Ambulatory Visit: Payer: Self-pay | Admitting: Family Medicine

## 2019-12-31 DIAGNOSIS — J4540 Moderate persistent asthma, uncomplicated: Secondary | ICD-10-CM

## 2020-01-08 ENCOUNTER — Encounter: Payer: Self-pay | Admitting: Nurse Practitioner

## 2020-01-08 ENCOUNTER — Ambulatory Visit (INDEPENDENT_AMBULATORY_CARE_PROVIDER_SITE_OTHER): Payer: Medicare Other | Admitting: Nurse Practitioner

## 2020-01-08 DIAGNOSIS — K5732 Diverticulitis of large intestine without perforation or abscess without bleeding: Secondary | ICD-10-CM

## 2020-01-08 MED ORDER — CIPROFLOXACIN HCL 500 MG PO TABS
500.0000 mg | ORAL_TABLET | Freq: Two times a day (BID) | ORAL | 0 refills | Status: DC
Start: 2020-01-08 — End: 2020-01-16

## 2020-01-08 MED ORDER — METRONIDAZOLE 500 MG PO TABS
500.0000 mg | ORAL_TABLET | Freq: Two times a day (BID) | ORAL | 0 refills | Status: DC
Start: 2020-01-08 — End: 2020-01-16

## 2020-01-08 NOTE — Progress Notes (Signed)
Virtual Visit via telephone Note Due to COVID-19 pandemic this visit was conducted virtually. This visit type was conducted due to national recommendations for restrictions regarding the COVID-19 Pandemic (e.g. social distancing, sheltering in place) in an effort to limit this patient's exposure and mitigate transmission in our community. All issues noted in this document were discussed and addressed.  A physical exam was not performed with this format.  I connected with Teresa [name] on 01/08/20 at 9:00 by telephone and verified that I am speaking with the correct person using two identifiers. Teresa [name] is currently located at home and no ne is currently with her during visit. The provider, Mary-Margaret Hassell Done, FNP is located in their office at time of visit.  I discussed the limitations, risks, security and privacy concerns of performing an evaluation and management service by telephone and the availability of in person appointments. I also discussed with the patient that there may be a patient responsible charge related to this service. The patient expressed understanding and agreed to proceed.   History and Present Illness:   Chief Complaint: Abdominal Pain   HPI Patient calls in c/o left lower quadrant pain that started about 2 days ago. She has a history of diverticulitis and the pain is the same as has been in the past. She has had loose stools with slight nausea. She denies any fever.   Review of Systems  Constitutional: Positive for malaise/fatigue. Negative for chills, fever and weight loss.  Respiratory: Negative.   Cardiovascular: Negative.   Gastrointestinal: Positive for abdominal pain (LLQ) and nausea. Negative for blood in stool, constipation, diarrhea, melena and vomiting.  Neurological: Negative.   Psychiatric/Behavioral: Negative.   All other systems reviewed and are negative.    Observations/Objective: Alert and oriented- answers all questions  appropriately No distress Patient c/o LLQ pain on palpation   Assessment and Plan: Teresa [name] in today with chief complaint of Abdominal Pain   1. Diverticulitis of large intestine without bleeding, unspecified complication status Watch diet- avoid foods with small seeds or undigestable skin Force fluids Meds ordered this encounter  Medications   metroNIDAZOLE (FLAGYL) 500 MG tablet    Sig: Take 1 tablet (500 mg total) by mouth 2 (two) times daily.    Dispense:  14 tablet    Refill:  0    Order Specific Question:   Supervising Provider    Answer:   Caryl Pina A [[id number]]   ciprofloxacin (CIPRO) 500 MG tablet    Sig: Take 1 tablet (500 mg total) by mouth 2 (two) times daily.    Dispense:  14 tablet    Refill:  0    Order Specific Question:   Supervising Provider    Answer:   Caryl Pina A [[id number]]        Follow Up Instructions: prn    I discussed the assessment and treatment plan with the patient. The patient was provided an opportunity to ask questions and all were answered. The patient agreed with the plan and demonstrated an understanding of the instructions.   The patient was advised to call back or seek an in-person evaluation if the symptoms worsen or if the condition fails to improve as anticipated.  The above assessment and management plan was discussed with the patient. The patient verbalized understanding of and has agreed to the management plan. Patient is aware to call the clinic if symptoms persist or worsen. Patient is aware when to return to the clinic for a  follow-up visit. Patient educated on when it is appropriate to go to the emergency department.   Time call ended:  9:15  I provided 15 minutes of non-face-to-face time during this encounter.    Mary-Margaret Hassell Done, FNP

## 2020-01-16 ENCOUNTER — Ambulatory Visit (INDEPENDENT_AMBULATORY_CARE_PROVIDER_SITE_OTHER): Payer: Medicare Other | Admitting: Family Medicine

## 2020-01-16 ENCOUNTER — Telehealth: Payer: Self-pay

## 2020-01-16 ENCOUNTER — Encounter: Payer: Self-pay | Admitting: Family Medicine

## 2020-01-16 DIAGNOSIS — J441 Chronic obstructive pulmonary disease with (acute) exacerbation: Secondary | ICD-10-CM | POA: Diagnosis not present

## 2020-01-16 MED ORDER — PREDNISONE 20 MG PO TABS: ORAL_TABLET | ORAL | 0 refills | Status: DC

## 2020-01-16 MED ORDER — AEROCHAMBER PLUS FLO-VU MEDIUM MISC: 1.0000 | Freq: Once | 0 refills | Status: AC

## 2020-01-16 MED ORDER — ALBUTEROL SULFATE (2.5 MG/3ML) 0.083% IN NEBU: 2.5000 mg | INHALATION_SOLUTION | RESPIRATORY_TRACT | 3 refills | Status: AC | PRN

## 2020-01-16 NOTE — Progress Notes (Signed)
Virtual Visit via telephone Note  I connected with Teresa [name] on 01/16/20 at 1124 by telephone and verified that I am speaking with the correct person using two identifiers. Teresa [name] is currently located at home and patient are currently with her during visit. The provider, Fransisca Kaufmann Mukund Weinreb, MD is located in their office at time of visit.  Call ended at 1135  I discussed the limitations, risks, security and privacy concerns of performing an evaluation and management service by telephone and the availability of in person appointments. I also discussed with the patient that there may be a patient responsible charge related to this service. The patient expressed understanding and agreed to proceed.   History and Present Illness: Patient is calling in for congestion and coughing and wheezing. She started yesterday and worsened today.  She denies any fevers or chills or shortness of breath.  She like it is getting into her chest. She feels tight with deep breaths.  No diagnosis found.  Outpatient Encounter Medications as of 01/16/2020  Medication Sig  . albuterol (PROVENTIL) (2.5 MG/3ML) 0.083% nebulizer solution NEBULIZE 1 VIAL EVERY 6 HOURS AS NEEDED FOR SHORTNESS OF BREATH  . albuterol (VENTOLIN HFA) 108 (90 Base) MCG/ACT inhaler USE 1 TO 2 PUFFS EVERY 4 TO 6 HOURS AS NEEDED  . buPROPion (WELLBUTRIN XL) 300 MG 24 hr tablet TAKE ONE (1) TABLET EACH DAY  . cetirizine (ZYRTEC) 10 MG tablet TAKE ONE TABLET EVERY MORNING  . ciprofloxacin (CIPRO) 500 MG tablet Take 1 tablet (500 mg total) by mouth 2 (two) times daily.  . citalopram (CELEXA) 20 MG tablet TAKE ONE (1) TABLET EACH DAY  . clonazePAM (KLONOPIN) 0.5 MG tablet Take 0.5 mg by mouth 3 (three) times daily as needed.  . cyclobenzaprine (FLEXERIL) 10 MG tablet Take 10 mg by mouth 3 (three) times daily as needed for muscle spasms.   Marland Kitchen dexlansoprazole (DEXILANT) 60 MG capsule Take 1 capsule (60 mg total) by mouth daily.  .  diclofenac sodium (VOLTAREN) 1 % GEL Apply 1 application topically 2 (two) times daily as needed (pain).   Marland Kitchen dicyclomine (BENTYL) 10 MG capsule TAKE ONE (1) CAPSULE THREE (3) TIMES EACH DAY AS NEEDED  . docusate sodium (COLACE) 100 MG capsule Take 100-200 mg by mouth daily as needed for mild constipation.   Marland Kitchen GAVILAX 17 GM/SCOOP powder MIX 17 GRAMS INTO 8OZ OF WATER AND DRINK DAILY  . lisinopril (ZESTRIL) 10 MG tablet Take 1 tablet (10 mg total) by mouth daily.  . metroNIDAZOLE (FLAGYL) 500 MG tablet Take 1 tablet (500 mg total) by mouth 2 (two) times daily.  . mometasone (NASONEX) 50 MCG/ACT nasal spray USE 2 SPRAYS IN EACH NOSTRIL DAILY  . montelukast (SINGULAIR) 10 MG tablet Take 1 tablet (10 mg total) by mouth at bedtime.  . Multiple Vitamins-Minerals (CENTRUM SILVER PO) Take 1 tablet by mouth daily.  . ondansetron (ZOFRAN) 4 MG tablet Take 4 mg by mouth every 8 (eight) hours as needed for nausea or vomiting.   Marland Kitchen oxymorphone (OPANA) 10 MG tablet Take 10 mg by mouth 3 (three) times daily as needed.  . SYMBICORT 160-4.5 MCG/ACT inhaler USE 2 PUFFS TWICE DAILY   No facility-administered encounter medications on file as of 01/16/2020.    Review of Systems  Constitutional: Negative for chills and fever.  HENT: Positive for congestion, rhinorrhea and sinus pressure. Negative for ear discharge, ear pain, sinus pain, sneezing and sore throat.   Eyes: Negative for visual disturbance.  Respiratory: Positive for cough and wheezing. Negative for chest tightness and shortness of breath.   Cardiovascular: Negative for chest pain and leg swelling.  Musculoskeletal: Negative for back pain and gait problem.  Skin: Negative for rash.  Neurological: Negative for light-headedness and headaches.  Psychiatric/Behavioral: Negative for agitation and behavioral problems.  All other systems reviewed and are negative.   Observations/Objective: Patient sounds comfortable and in  No acute distress  Assessment  and Plan: Problem List Items Addressed This Visit    None    Visit Diagnoses    COPD exacerbation ([place])    -  Primary   Relevant Medications   albuterol (PROVENTIL) (2.5 MG/3ML) 0.083% nebulizer solution   Spacer/Aero-Holding Chambers (AEROCHAMBER PLUS FLO-VU MEDIUM) MISC   predniSONE (DELTASONE) 20 MG tablet   Other Relevant Orders   For home use only DME Nebulizer machine       Follow up plan: Return in about 4 weeks (around 02/13/2020), or if symptoms worsen or fail to improve, for copd recheck.     I discussed the assessment and treatment plan with the patient. The patient was provided an opportunity to ask questions and all were answered. The patient agreed with the plan and demonstrated an understanding of the instructions.   The patient was advised to call back or seek an in-person evaluation if the symptoms worsen or if the condition fails to improve as anticipated.  The above assessment and management plan was discussed with the patient. The patient verbalized understanding of and has agreed to the management plan. Patient is aware to call the clinic if symptoms persist or worsen. Patient is aware when to return to the clinic for a follow-up visit. Patient educated on when it is appropriate to go to the emergency department.    I provided 11 minutes of non-face-to-face time during this encounter.    Worthy Rancher, MD

## 2020-01-16 NOTE — Telephone Encounter (Signed)
Appt has been scheduled in the office 01/2020 at 10:10am. Pt aware to inform the office if her symptoms become worse.

## 2020-01-22 ENCOUNTER — Telehealth: Payer: Self-pay

## 2020-01-22 NOTE — Telephone Encounter (Signed)
Lady from [employer] called to see if we every received a fax from them that was sent to [place] on 12/27/19 regarding order request for incontinence supplies.   Did not see any notes or orders in pt's chart regarding it.  Lady said she would refax the order request.

## 2020-01-23 NOTE — Telephone Encounter (Signed)
Yes I think I remember trying to do the order and they would not take it and she does need to come in for a visit.

## 2020-01-23 NOTE — Telephone Encounter (Signed)
Called and spoke with patient she states she does not need any supplies at this time will call if she thinks she is going to need them

## 2020-01-23 NOTE — Telephone Encounter (Signed)
Form placed on providers desk, she may need visit, they are requesting notes

## 2020-01-23 NOTE — Telephone Encounter (Signed)
Have you seen any orders for incontinence supplies for her, I think we did something a while ago but I have nothing in my desk currently.

## 2020-01-29 ENCOUNTER — Ambulatory Visit (INDEPENDENT_AMBULATORY_CARE_PROVIDER_SITE_OTHER): Payer: Medicare Other | Admitting: Internal Medicine

## 2020-02-01 ENCOUNTER — Other Ambulatory Visit: Payer: Self-pay | Admitting: Family Medicine

## 2020-02-15 ENCOUNTER — Ambulatory Visit (INDEPENDENT_AMBULATORY_CARE_PROVIDER_SITE_OTHER): Payer: Medicare Other

## 2020-02-15 DIAGNOSIS — Z0000 Encounter for general adult medical examination without abnormal findings: Secondary | ICD-10-CM

## 2020-02-15 NOTE — Progress Notes (Signed)
MEDICARE ANNUAL WELLNESS VISIT  02/15/2020  Telephone Visit Disclaimer This Medicare AWV was conducted by telephone due to national recommendations for restrictions regarding the COVID-19 Pandemic (e.g. social distancing).  I verified, using two identifiers, that I am speaking with Teresa [name] or their authorized healthcare agent. I discussed the limitations, risks, security, and privacy concerns of performing an evaluation and management service by telephone and the potential availability of an in-person appointment in the future. The patient expressed understanding and agreed to proceed.  Location of Patient: Home Location of Provider (nurse):  [facility]  Subjective:    Teresa [name] is a 64 y.o. female patient of Dettinger, Fransisca Kaufmann, MD who had a Medicare Annual Wellness Visit today via telephone. Teresa [name] is Disabled and lives alone. She has one child, nine granchildren and two great grandchildren. She reports that she is socially active and does interact with friends/family regularly. She is minimally physically active and enjoys going to church and spending time with friends.  Patient Care Team: Dettinger, Fransisca Kaufmann, MD as PCP - General (Family Medicine) Irene Shipper, MD as Consulting Physician (Gastroenterology)  Advanced Directives 02/15/2020 08/10/2019 08/08/2019 02/13/2019 04/28/2018 04/21/2018 02/27/2018  Does Patient Have a Medical Advance Directive? No No No No No No No  Would patient like information on creating a medical advance directive? No - Patient declined No - Patient declined No - Patient declined No - Patient declined No - Patient declined No - Patient declined No - Patient declined  Pre-existing out of facility DNR order (yellow form or pink MOST form) - - - - - - -    Hospital Utilization Over the Past 12 Months: # of hospitalizations or ER visits: 0 # of surgeries: 0  Review of Systems    Patient reports that her overall health is worse compared to last  year.  History obtained from chart review and the patient  Patient Reported Readings (BP, Pulse, CBG, Weight, etc) none  Pain Assessment Pain : 0-10 Pain Score: 4  Pain Location: Back Pain Orientation: Left Pain Descriptors / Indicators: Aching,Discomfort Pain Onset: More than a month ago Pain Frequency: Occasional Pain Relieving Factors: Pain medication  Pain Relieving Factors: Pain medication  Current Medications & Allergies (verified) Allergies as of 02/15/2020      Reactions   Lyrica [pregabalin] Shortness Of Breath   Trazodone And Nefazodone Shortness Of Breath   Amitriptyline    Arthrotec [diclofenac-misoprostol] Other (See Comments)   Upset stomach   Duloxetine    Keflex [cephalexin] Other (See Comments)   Unknown reaction   Naproxen Other (See Comments)   Irritates stomach   Other    Prozac [fluoxetine Hcl] Other (See Comments)   "makes me feel bad"   Remeron [mirtazapine] Other (See Comments)   "off balance"   Zolpidem Other (See Comments)   Other   Ambien [zolpidem Tartrate] Other (See Comments)   COULDN'T FUNCTION, STAYED SLEEPY      Medication List       Accurate as of February 15, 2020  1:31 PM. If you have any questions, ask your nurse or doctor.        STOP taking these medications   predniSONE 20 MG tablet Commonly known as: DELTASONE     TAKE these medications   albuterol 108 (90 Base) MCG/ACT inhaler Commonly known as: VENTOLIN HFA USE 1 TO 2 PUFFS EVERY 4 TO 6 HOURS AS NEEDED   albuterol (2.5 MG/3ML) 0.083% nebulizer solution Commonly known as:  PROVENTIL Take 3 mLs (2.5 mg total) by nebulization every 4 (four) hours as needed for wheezing or shortness of breath.   buPROPion 300 MG 24 hr tablet Commonly known as: WELLBUTRIN XL TAKE ONE (1) TABLET EACH DAY   CENTRUM SILVER PO Take 1 tablet by mouth daily.   cetirizine 10 MG tablet Commonly known as: ZYRTEC TAKE ONE TABLET EVERY MORNING   citalopram 20 MG tablet Commonly  known as: CELEXA TAKE ONE (1) TABLET EACH DAY   clonazePAM 0.5 MG tablet Commonly known as: KLONOPIN Take 0.5 mg by mouth 3 (three) times daily as needed.   cyclobenzaprine 10 MG tablet Commonly known as: FLEXERIL Take 10 mg by mouth 3 (three) times daily as needed for muscle spasms.   Dexilant 60 MG capsule Generic drug: dexlansoprazole Take 1 capsule (60 mg total) by mouth daily.   diclofenac sodium 1 % Gel Commonly known as: VOLTAREN Apply 1 application topically 2 (two) times daily as needed (pain).   dicyclomine 10 MG capsule Commonly known as: BENTYL TAKE ONE (1) CAPSULE THREE (3) TIMES EACH DAY AS NEEDED   docusate sodium 100 MG capsule Commonly known as: COLACE Take 100-200 mg by mouth daily as needed for mild constipation.   GaviLAX 17 GM/SCOOP powder Generic drug: polyethylene glycol powder MIX 17 GRAMS INTO 8OZ OF WATER AND DRINK DAILY   lisinopril 10 MG tablet Commonly known as: ZESTRIL Take 1 tablet (10 mg total) by mouth daily.   mometasone 50 MCG/ACT nasal spray Commonly known as: NASONEX USE 2 SPRAYS IN EACH NOSTRIL DAILY   montelukast 10 MG tablet Commonly known as: SINGULAIR Take 1 tablet (10 mg total) by mouth at bedtime.   ondansetron 4 MG tablet Commonly known as: ZOFRAN Take 4 mg by mouth every 8 (eight) hours as needed for nausea or vomiting.   oxymorphone 10 MG tablet Commonly known as: OPANA Take 10 mg by mouth 3 (three) times daily as needed.   Symbicort 160-4.5 MCG/ACT inhaler Generic drug: budesonide-formoterol USE 2 PUFFS TWICE DAILY       History (reviewed): Past Medical History:  Diagnosis Date   Anxiety state, unspecified    Asthma    prn inhaler   Chronic back pain greater than 3 months duration    Colon polyp    COPD (chronic obstructive pulmonary disease) (HCC)    Degeneration of intervertebral disc, site unspecified    Depressive disorder, not elsewhere classified    Diverticulitis    Diverticulosis  07-08-2005   colonoscopy   Esophageal stricture 2014   Full dentures    GERD (gastroesophageal reflux disease) 07-08-2005   EGD   Hepatitis C    Hiatal hernia 07-08-2005   EGD   Hypertension    Insomnia    Irritable bowel syndrome    Migraine    Muscle spasm    Osteoarthrosis, unspecified whether generalized or localized, lower leg    Papilloma of breast 09/2011   left   Seasonal allergies    Seizures ([place]) 1990s   x 1, unknown cause; no seizures since; no meds, unknown etiology   Vocal cord polyp    history of   Past Surgical History:  Procedure Laterality Date   ABDOMINAL HYSTERECTOMY     partial   BILATERAL SALPINGOOPHORECTOMY     BIOPSY  08/10/2019   Procedure: BIOPSY;  Surgeon: Rogene Houston, MD;  [facility];  Service: Endoscopy;;  antral   BLADDER SURGERY     bladder tack  BREAST BIOPSY  11/03/2011   Procedure: BREAST BIOPSY WITH NEEDLE LOCALIZATION;  Surgeon: Harl Bowie, MD;  Location: [facility];  Service: General;  Laterality: Left;  needle localized left breast biopsy   CHOLECYSTECTOMY     COLONOSCOPY  07/08/2005   562.10   COLONOSCOPY WITH PROPOFOL N/A 04/28/2018   Procedure: COLONOSCOPY WITH PROPOFOL;  Surgeon: Rogene Houston, MD;  [facility];  Service: Endoscopy;  Laterality: N/A;  830   CYSTOSCOPY  08/24/2011   Procedure: CYSTOSCOPY;  Surgeon: Reece Packer, MD;  Location: [facility];  Service: Urology;  Laterality: N/A;  Cystoscopy,  Rectocele Repair and Vault Prolapse Repair   ESOPHAGEAL DILATION N/A 08/10/2019   Procedure: ESOPHAGEAL DILATION;  Surgeon: Rogene Houston, MD;  [facility];  Service: Endoscopy;  Laterality: N/A;   ESOPHAGOGASTRODUODENOSCOPY  10/03/2012   multiple    ESOPHAGOGASTRODUODENOSCOPY (EGD) WITH PROPOFOL N/A 08/10/2019   Procedure: ESOPHAGOGASTRODUODENOSCOPY (EGD) WITH PROPOFOL;  Surgeon: Rogene Houston, MD;  [facility];  Service:  Endoscopy;  Laterality: N/A;  950   OTHER SURGICAL HISTORY     exc. vocal cord polyp   POLYPECTOMY  04/28/2018   Procedure: POLYPECTOMY;  Surgeon: Rogene Houston, MD;  [facility];  Service: Endoscopy;;  colon   RECTAL TUMOR BY PROCTOTOMY EXCISION     RECTOCELE REPAIR  08/24/2011   Procedure: POSTERIOR REPAIR (RECTOCELE);  Surgeon: Reece Packer, MD;  Location: [facility];  Service: Urology;  Laterality: N/A;   rectocelle repair     TUMOR REMOVAL     benign   VAGINAL PROLAPSE REPAIR  08/24/2011   Procedure: VAGINAL VAULT SUSPENSION;  Surgeon: Reece Packer, MD;  Location: [facility];  Service: Urology;  Laterality: N/A;   Family History  Problem Relation Age of Onset   Prostate cancer Father    Sleep apnea Father    Stomach cancer Maternal Uncle    Diabetes Maternal Grandmother    Stroke Maternal Grandmother    Heart disease Paternal Grandmother    Irritable bowel syndrome Other    Stroke Maternal Grandfather    Social History   Socioeconomic History   Marital status: Divorced    Spouse name: Not on file   Number of children: 1   Years of education: GED   Highest education level: Not on file  Occupational History   Occupation: Disabled  Tobacco Use   Smoking status: Former Smoker    Packs/day: 1.50    Years: 0.50    Pack years: 0.75    Types: Cigarettes    Quit date: 08/20/1991    Years since quitting: 28.5   Smokeless tobacco: Never Used  Vaping Use   Vaping Use: Never used  Substance and Sexual Activity   Alcohol use: No    Alcohol/week: 0.0 standard drinks    Comment: quit 26 yrs ago, 04/21/18 per pt   Drug use: Not Currently    Comment: quit 26 yrs ago. Cocaine and marijuana, 04/21/18 yrs ago   Sexual activity: Not Currently  Other Topics Concern   Not on file  Social History Narrative   Lives in an apartment. Her oldest granddaughter lives with her.   Social Determinants of Health   Financial Resource Strain: Not on  file  Food Insecurity: Not on file  Transportation Needs: Not on file  Physical Activity: Not on file  Stress: Not on file  Social Connections: Not on file    Activities  of Daily Living In your present state of health, do you have any difficulty performing the following activities: 02/15/2020 08/08/2019  Hearing? N N  Vision? N N  Difficulty concentrating or making decisions? N N  Walking or climbing stairs? N Y  Comment - SOB  Dressing or bathing? N N  Doing errands, shopping? N N  Preparing Food and eating ? N -  Using the Toilet? N -  In the past six months, have you accidently leaked urine? N -  Do you have problems with loss of bowel control? N -  Managing your Medications? N -  Managing your Finances? N -  Housekeeping or managing your Housekeeping? N -  Some recent data might be hidden    Patient Education/ Literacy How often do you need to have someone help you when you read instructions, pamphlets, or other written materials from your doctor or pharmacy?: 1 - Never What is the last grade level you completed in school?: GED  Exercise Current Exercise Habits: The patient does not participate in regular exercise at present, Exercise limited by: orthopedic condition(s)  Diet Patient reports consuming 2 meals a day and 2 snack(s) a day Patient reports that her primary diet is: Regular Patient reports that she does have regular access to food.   Depression Screen PHQ 2/9 Scores 02/15/2020 12/03/2019 02/13/2019 10/16/2018 02/24/2018 02/07/2018 01/27/2018  PHQ - 2 Score 0 0 1 1 2 1 1   PHQ- 9 Score - 2 - - 6 1 -     Fall Risk Fall Risk  02/15/2020 12/03/2019 02/13/2019 01/27/2018 01/10/2018  Falls in the past year? 0 0 0 0 0  Number falls in past yr: - - - 0 -  Follow up Falls evaluation completed - - - -     Objective:  Teresa [name] seemed alert and oriented and she participated appropriately during our telephone visit.  Blood Pressure Weight BMI  BP Readings  from Last 3 Encounters:  12/03/19 136/85  08/10/19 126/77  08/08/19 119/72   Wt Readings from Last 3 Encounters:  12/03/19 151 lb 8 oz (68.7 kg)  08/10/19 153 lb (69.4 kg)  08/08/19 154 lb (69.9 kg)   BMI Readings from Last 1 Encounters:  12/03/19 27.71 kg/m    *Unable to obtain current vital signs, weight, and BMI due to telephone visit type  Hearing/Vision   Ketsia did not seem to have difficulty with hearing/understanding during the telephone conversation  Reports that she has had a formal eye exam by an eye care professional within the past year  Reports that she has not had a formal hearing evaluation within the past year *Unable to fully assess hearing and vision during telephone visit type  Cognitive Function: 6CIT Screen 02/15/2020 02/13/2019  What Year? 0 points 0 points  What month? 0 points 0 points  What time? 0 points 0 points  Count back from 20 0 points 0 points  Months in reverse 0 points 0 points  Repeat phrase 2 points 2 points  Total Score 2 2   (Normal:0-7, Significant for Dysfunction: >8)  Normal Cognitive Function Screening: Yes   Immunization & Health Maintenance Record Immunization History  Administered Date(s) Administered   Influenza,inj,Quad PF,6+ Mos 12/19/2013, 11/28/2014, 01/27/2016, 12/20/2017, 12/11/2018   Pneumococcal Polysaccharide-23 06/28/2017   Tdap 12/07/2011, 01/27/2016   Zoster 10/17/2014    Health Maintenance  Topic Date Due   COVID-19 Vaccine (1) Never done   PAP SMEAR-Modifier  05/01/2018   MAMMOGRAM  01/25/2020  DEXA SCAN  01/26/2020   INFLUENZA VACCINE  05/29/2020 (Originally 09/30/2019)   COLONOSCOPY  04/29/2023   TETANUS/TDAP  01/26/2026   Hepatitis C Screening  Completed   HIV Screening  Completed       Assessment  This is a routine wellness examination for Teresa [name].  Health Maintenance: Due or Overdue Health Maintenance Due  Topic Date Due   COVID-19 Vaccine (1) Never done   PAP  SMEAR-Modifier  05/01/2018   MAMMOGRAM  01/25/2020   DEXA SCAN  01/26/2020    Teresa [name] does not need a referral for Community Assistance: Care Management:   no Social Work:    no Prescription Assistance:  no Nutrition/Diabetes Education:  no   Plan:  Personalized Goals Goals Addressed            This Visit's Progress    Patient Stated       02/15/2020 AWV Goal: Exercise for General Health   Patient will verbalize understanding of the benefits of increased physical activity:  Exercising regularly is important. It will improve your overall fitness, flexibility, and endurance.  Regular exercise also will improve your overall health. It can help you control your weight, reduce stress, and improve your bone density.  Over the next year, patient will increase physical activity as tolerated with a goal of at least 150 minutes of moderate physical activity per week.   You can tell that you are exercising at a moderate intensity if your heart starts beating faster and you start breathing faster but can still hold a conversation.  Moderate-intensity exercise ideas include:  Walking 1 mile (1.6 km) in about 15 minutes  Biking  Hiking  Golfing  Dancing  Water aerobics  Patient will verbalize understanding of everyday activities that increase physical activity by providing examples like the following: ? Yard work, such as: ? Pushing a Conservation officer, nature ? Raking and bagging leaves ? Washing your car ? Pushing a stroller ? Shoveling snow ? Gardening ? Washing windows or floors  Patient will be able to explain general safety guidelines for exercising:   Before you start a new exercise program, talk with your health care provider.  Do not exercise so much that you hurt yourself, feel dizzy, or get very short of breath.  Wear comfortable clothes and wear shoes with good support.  Drink plenty of water while you exercise to prevent dehydration or heat stroke.  Work  out until your breathing and your heartbeat get faster.       Personalized Health Maintenance & Screening Recommendations  Pneumococcal vaccine  Screening mammography Bone densitometry screening  Lung Cancer Screening Recommended: no (Low Dose CT Chest recommended if Age 79-80 years, 30 pack-year currently smoking OR have quit w/in past 15 years) Hepatitis C Screening recommended: no HIV Screening recommended: no  Advanced Directives: Written information was not prepared per patient's request.  Referrals & Orders No orders of the defined types were placed in this encounter.   Follow-up Plan  Follow-up with Dettinger, Fransisca Kaufmann, MD as planned  Schedule bone density screening  Schedule mammogram   I have personally reviewed and noted the following in the patients chart:    Medical and social history  Use of alcohol, tobacco or illicit drugs   Current medications and supplements  Functional ability and status  Nutritional status  Physical activity  Advanced directives  List of other physicians  Hospitalizations, surgeries, and ER visits in previous 12 months  Vitals  Screenings to  include cognitive, depression, and falls  Referrals and appointments  In addition, I have reviewed and discussed with Teresa [name] certain preventive protocols, quality metrics, and best practice recommendations. A written personalized care plan for preventive services as well as general preventive health recommendations is available and can be mailed to the patient at her request.      Felicity Coyer, LPN    [id number]   Patient declined after visit summary

## 2020-02-18 ENCOUNTER — Ambulatory Visit (INDEPENDENT_AMBULATORY_CARE_PROVIDER_SITE_OTHER): Payer: Medicare Other | Admitting: Family Medicine

## 2020-02-18 NOTE — Progress Notes (Signed)
Patient did not answer her phone for her phone visit, please reschedule. Caryl Pina, MD [facility] 02/18/2020, 10:46 AM

## 2020-02-20 DIAGNOSIS — G5600 Carpal tunnel syndrome, unspecified upper limb: Secondary | ICD-10-CM | POA: Diagnosis not present

## 2020-02-20 DIAGNOSIS — M25569 Pain in unspecified knee: Secondary | ICD-10-CM | POA: Diagnosis not present

## 2020-02-20 DIAGNOSIS — M545 Low back pain, unspecified: Secondary | ICD-10-CM | POA: Diagnosis not present

## 2020-02-20 DIAGNOSIS — M542 Cervicalgia: Secondary | ICD-10-CM | POA: Diagnosis not present

## 2020-02-25 ENCOUNTER — Encounter: Payer: Self-pay | Admitting: Family Medicine

## 2020-02-25 ENCOUNTER — Ambulatory Visit (INDEPENDENT_AMBULATORY_CARE_PROVIDER_SITE_OTHER): Payer: Medicare Other | Admitting: Family Medicine

## 2020-02-25 ENCOUNTER — Other Ambulatory Visit: Payer: Self-pay

## 2020-02-25 VITALS — BP 139/87 | HR 110 | Temp 97.9°F | Ht 62.0 in | Wt 157.0 lb

## 2020-02-25 DIAGNOSIS — R350 Frequency of micturition: Secondary | ICD-10-CM

## 2020-02-25 DIAGNOSIS — R1084 Generalized abdominal pain: Secondary | ICD-10-CM | POA: Diagnosis not present

## 2020-02-25 DIAGNOSIS — R21 Rash and other nonspecific skin eruption: Secondary | ICD-10-CM

## 2020-02-25 LAB — URINALYSIS, COMPLETE
Bilirubin, UA: NEGATIVE
Glucose, UA: NEGATIVE
Ketones, UA: NEGATIVE
Leukocytes,UA: NEGATIVE
Nitrite, UA: NEGATIVE
Protein,UA: NEGATIVE
RBC, UA: NEGATIVE
Specific Gravity, UA: 1.025 (ref 1.005–1.030)
Urobilinogen, Ur: 0.2 mg/dL (ref 0.2–1.0)
pH, UA: 6 (ref 5.0–7.5)

## 2020-02-25 LAB — MICROSCOPIC EXAMINATION
RBC, Urine: NONE SEEN /hpf (ref 0–2)
WBC, UA: NONE SEEN /hpf (ref 0–5)

## 2020-02-25 MED ORDER — PERMETHRIN 5 % EX CREA: 1.0000 "application " | TOPICAL_CREAM | Freq: Once | CUTANEOUS | 0 refills | Status: AC

## 2020-02-25 NOTE — Progress Notes (Signed)
Subjective: CC:?  UTI PCP: Dettinger, Elige Radon, MD [id number] Teresa [name] is a 64 y.o. female presenting to clinic today for:  1.  Urinary frequency Patient reports about a 8-day history of increased urination.  Sometimes she has to adjust her bladder/position in efforts to completely evacuate the bladder.  She reports history of cystocele repair and rectocele repair.  She struggles with chronic constipation.  She is chronically treated with opioids.  She does take Colace but reports that her last few bowel movements had been pellet-like in nature.  Things seem to be getting somewhat better today she had a normal bowel movement.  She does admit to less pressure in the lower abdomen.  No fevers, vomiting.  2.  Rash Patient reports ongoing itchy rash on the upper extremities, back.  She was previously treated with Elimite cream for scabies but notes that she failed to sanitize her home is worried that she may have reinfected herself.   ROS: Per HPI  Allergies  Allergen Reactions   Lyrica [Pregabalin] Shortness Of Breath   Trazodone And Nefazodone Shortness Of Breath   Amitriptyline    Arthrotec [Diclofenac-Misoprostol] Other (See Comments)    Upset stomach   Duloxetine    Keflex [Cephalexin] Other (See Comments)    Unknown reaction   Naproxen Other (See Comments)    Irritates stomach   Other    Prozac [Fluoxetine Hcl] Other (See Comments)    "makes me feel bad"   Remeron [Mirtazapine] Other (See Comments)    "off balance"   Zolpidem Other (See Comments)    Other   Ambien [Zolpidem Tartrate] Other (See Comments)    COULDN'T FUNCTION, STAYED SLEEPY   Past Medical History:  Diagnosis Date   Anxiety state, unspecified    Asthma    prn inhaler   Chronic back pain greater than 3 months duration    Colon polyp    COPD (chronic obstructive pulmonary disease) (HCC)    Degeneration of intervertebral disc, site unspecified    Depressive disorder, not elsewhere  classified    Diverticulitis    Diverticulosis 07-08-2005   colonoscopy   Esophageal stricture 2014   Full dentures    GERD (gastroesophageal reflux disease) 07-08-2005   EGD   Hepatitis C    Hiatal hernia 07-08-2005   EGD   Hypertension    Insomnia    Irritable bowel syndrome    Migraine    Muscle spasm    Osteoarthrosis, unspecified whether generalized or localized, lower leg    Papilloma of breast 09/2011   left   Seasonal allergies    Seizures (HCC) 1990s   x 1, unknown cause; no seizures since; no meds, unknown etiology   Vocal cord polyp    history of    Current Outpatient Medications:    albuterol (PROVENTIL) (2.5 MG/3ML) 0.083% nebulizer solution, Take 3 mLs (2.5 mg total) by nebulization every 4 (four) hours as needed for wheezing or shortness of breath., Disp: 150 mL, Rfl: 3   albuterol (VENTOLIN HFA) 108 (90 Base) MCG/ACT inhaler, USE 1 TO 2 PUFFS EVERY 4 TO 6 HOURS AS NEEDED, Disp: 25.5 g, Rfl: 4   buPROPion (WELLBUTRIN XL) 300 MG 24 hr tablet, TAKE ONE (1) TABLET EACH DAY, Disp: 90 tablet, Rfl: 0   cetirizine (ZYRTEC) 10 MG tablet, TAKE ONE TABLET EVERY MORNING, Disp: 90 tablet, Rfl: 3   citalopram (CELEXA) 20 MG tablet, TAKE ONE (1) TABLET EACH DAY, Disp: 30 tablet, Rfl: 0   clonazePAM (  KLONOPIN) 0.5 MG tablet, Take 0.5 mg by mouth 3 (three) times daily as needed., Disp: , Rfl:    cyclobenzaprine (FLEXERIL) 10 MG tablet, Take 10 mg by mouth 3 (three) times daily as needed for muscle spasms. , Disp: , Rfl:    dexlansoprazole (DEXILANT) 60 MG capsule, Take 1 capsule (60 mg total) by mouth daily., Disp: 90 capsule, Rfl: 3   diclofenac sodium (VOLTAREN) 1 % GEL, Apply 1 application topically 2 (two) times daily as needed (pain). , Disp: , Rfl:    dicyclomine (BENTYL) 10 MG capsule, TAKE ONE (1) CAPSULE THREE (3) TIMES EACH DAY AS NEEDED, Disp: 90 capsule, Rfl: 0   docusate sodium (COLACE) 100 MG capsule, Take 100-200 mg by mouth daily as needed  for mild constipation. , Disp: , Rfl:    GAVILAX 17 GM/SCOOP powder, MIX 17 GRAMS INTO 8OZ OF WATER AND DRINK DAILY, Disp: 510 g, Rfl: 1   lisinopril (ZESTRIL) 10 MG tablet, Take 1 tablet (10 mg total) by mouth daily., Disp: 90 tablet, Rfl: 3   mometasone (NASONEX) 50 MCG/ACT nasal spray, USE 2 SPRAYS IN EACH NOSTRIL DAILY, Disp: 51 g, Rfl: 2   montelukast (SINGULAIR) 10 MG tablet, Take 1 tablet (10 mg total) by mouth at bedtime., Disp: 90 tablet, Rfl: 3   Multiple Vitamins-Minerals (CENTRUM SILVER PO), Take 1 tablet by mouth daily., Disp: , Rfl:    ondansetron (ZOFRAN) 4 MG tablet, Take 4 mg by mouth every 8 (eight) hours as needed for nausea or vomiting. , Disp: , Rfl:    oxymorphone (OPANA) 10 MG tablet, Take 10 mg by mouth 3 (three) times daily as needed., Disp: , Rfl:    permethrin (ELIMITE) 5 % cream, Apply 1 application topically once for 1 dose., Disp: 60 g, Rfl: 0   predniSONE (DELTASONE) 10 MG tablet, prednisone 10 mg tablet  Take 1 tablet every day by oral route.  Take 1 tablet three times a day x 3 days; then 1 tablet twice a day 4 days; then 1 daily x 3 days; then 1/2 daily until done, Disp: , Rfl:    SYMBICORT 160-4.5 MCG/ACT inhaler, USE 2 PUFFS TWICE DAILY, Disp: 10.2 g, Rfl: 2 Social History   Socioeconomic History   Marital status: Divorced    Spouse name: Not on file   Number of children: 1   Years of education: GED   Highest education level: Not on file  Occupational History   Occupation: Disabled  Tobacco Use   Smoking status: Former Smoker    Packs/day: 1.50    Years: 0.50    Pack years: 0.75    Types: Cigarettes    Quit date: 08/20/1991    Years since quitting: 28.5   Smokeless tobacco: Never Used  Vaping Use   Vaping Use: Never used  Substance and Sexual Activity   Alcohol use: No    Alcohol/week: 0.0 standard drinks    Comment: quit 26 yrs ago, 04/21/18 per pt   Drug use: Not Currently    Comment: quit 26 yrs ago. Cocaine and marijuana,  04/21/18 yrs ago   Sexual activity: Not Currently  Other Topics Concern   Not on file  Social History Narrative   Lives in an apartment. Her oldest granddaughter lives with her.   Social Determinants of Health   Financial Resource Strain: Not on file  Food Insecurity: Not on file  Transportation Needs: Not on file  Physical Activity: Not on file  Stress: Not on file  Social Connections: Not on file  Intimate Partner Violence: Not on file   Family History  Problem Relation Age of Onset   Prostate cancer Father    Sleep apnea Father    Stomach cancer Maternal Uncle    Diabetes Maternal Grandmother    Stroke Maternal Grandmother    Heart disease Paternal Grandmother    Irritable bowel syndrome Other    Stroke Maternal Grandfather     Objective: Office vital signs reviewed. BP 139/87    Pulse (!) 110    Temp 97.9 F (36.6 C) (Temporal)    Ht 5\' 2"  (1.575 m)    Wt 157 lb (71.2 kg)    SpO2 98%    BMI 28.72 kg/m   Physical Examination:  General: Awake, alert, nontoxic, No acute distress GI: soft, generalized tenderness to palpation.  No guarding or rebound, non-distended, bowel sounds present x4, no hepatomegaly, no splenomegaly, no masses GU no suprapubic tenderness palpation or CVA tenderness to palpation. Skin: Several excoriated lesions noted along the back, upper extremities.  No interdigital lesions noted.  No evidence of secondary bacterial infection  Assessment/ Plan: 64 y.o. female   Generalized abdominal pain  Urinary frequency - Plan: Urinalysis, Complete, Urine Culture  Rash - Plan: permethrin (ELIMITE) 5 % cream  Abdominal pain likely due to chronic constipation advised to proceed with fiber supplements.  Increase fluid intake.  Follow-up in 1 month, sooner if needed to see PCP.  Consider something like Linzess or Trulance if ongoing constipation, particularly given chronic opioid use  I suspect urinary frequency is more likely incomplete bladder  emptying in the setting of chronic constipation as above.  History of cystocele repair per her report and therefore she may need referral to urology if symptoms are ongoing despite normalization of bowel movements.  No evidence of UTI but will send for culture for completion  I have renewed her Elimite as she was previously treated for scabies but failed to sanitize her car.  Concern for possible reinfestation.  Handout provided.  Follow-up as needed   Orders Placed This Encounter  Procedures   Urine Culture   Urinalysis, Complete   Meds ordered this encounter  Medications   permethrin (ELIMITE) 5 % cream    Sig: Apply 1 application topically once for 1 dose.    Dispense:  60 g    Refill:  [place], Teresa [name] [place] [phone]

## 2020-02-25 NOTE — Patient Instructions (Signed)
Your urine did not show evidence of infection but I am sending it for culture to be sure  I suspect that your abdominal pain is more to do with chronic constipation.  Please make sure that you are getting enough water in.  Take that fiber supplement like you had planned  Follow-up with Dr. Warrick Parisian in the next 2 to 4 weeks for recheck   Scabies, Adult  Scabies is a skin condition that happens when very small insects get under the skin (infestation). This causes a rash and severe itchiness. Scabies can spread from person to person (is contagious). If you get scabies, it is common for others in your household to get scabies too. With proper treatment, symptoms usually go away in 2-4 weeks. Scabies usually does not cause lasting problems. What are the causes? This condition is caused by tiny mites (Sarcoptes scabiei, or human itch mites) that can only be seen with a microscope. The mites get into the top layer of skin and lay eggs. Scabies can spread from person to person through:  Close contact with a person who has scabies.  Sharing or having contact with infested items, such as towels, bedding, or clothing. What increases the risk? The following factors may make you more likely to develop this condition:  Living in a nursing home or other extended care facility.  Having sexual contact with a partner who has scabies.  Caring for others who are at increased risk for scabies. What are the signs or symptoms? Symptoms of this condition include:  Severe itchiness. This is often worse at night.  A rash that includes tiny red bumps or blisters. The rash commonly occurs on the hands, wrists, elbows, armpits, chest, waist, groin, or buttocks. The bumps may form a line (burrow) in some areas.  Skin irritation. This can include scaly patches or sores. How is this diagnosed? This condition may be diagnosed based on:  A physical exam of the skin.  A skin test. Your health care provider may  take a sample of your affected skin (skin scraping) and have it examined under a microscope for signs of mites. How is this treated? This condition may be treated with:  Medicated cream or lotion that kills the mites. This is spread on the entire body and left on for several hours. Usually, one treatment with medicated cream or lotion is enough to kill all the mites. In severe cases, the treatment may need to be repeated.  Medicated cream that relieves itching.  Medicines taken by mouth (orally) that: ? Relieve itching. ? Reduce the swelling and redness. ? Kill the mites. This treatment may be done in severe cases. Follow these instructions at home: Medicines   Take or apply over-the-counter and prescription medicines as told by your health care provider.  Apply medicated cream or lotion as told by your health care provider.  Do not wash off the medicated cream or lotion until the necessary amount of time has passed. Skin care   Avoid scratching the affected areas of your skin.  Keep your fingernails closely trimmed to reduce injury from scratching.  Take cool baths or apply cool washcloths to your skin to help reduce itching. General instructions  Clean all items that you recently had contact with, including bedding, clothing, and furniture. Do this on the same day that you start treatment. ? Dry clean items, or use hot water to wash items. Dry items on the hot dry cycle. ? Place items that cannot be washed into  closed, airtight plastic bags for at least 3 days. The mites cannot live for more than 3 days away from human skin. ? Vacuum furniture and mattresses that you use.  Make sure that other people who may have been infested are examined by a health care provider. These include members of your household and anyone who may have had contact with infested items.  Keep all follow-up visits as told by your health care provider. This is important. Contact a health care provider  if:  You have itching that does not go away after 4 weeks of treatment.  You continue to develop new bumps or burrows.  You have redness, swelling, or pain in your rash area after treatment.  You have fluid, blood, or pus coming from your rash. Summary  Scabies is a skin condition that causes a rash and severe itchiness.  This condition is caused by tiny mites that get into the top layer of the skin and lay eggs.  Scabies can spread from person to person.  Follow treatments as recommended by your health care provider.  Clean all items that you recently had contact with. This information is not intended to replace advice given to you by your health care provider. Make sure you discuss any questions you have with your health care provider. Document Revised: 12/21/2017 Document Reviewed: 12/21/2017 Elsevier Patient Education  2020 [employer].

## 2020-02-26 LAB — URINE CULTURE

## 2020-03-02 DIAGNOSIS — R059 Cough, unspecified: Secondary | ICD-10-CM | POA: Diagnosis not present

## 2020-03-02 DIAGNOSIS — U071 COVID-19: Secondary | ICD-10-CM | POA: Diagnosis not present

## 2020-03-02 DIAGNOSIS — I10 Essential (primary) hypertension: Secondary | ICD-10-CM | POA: Diagnosis not present

## 2020-03-02 DIAGNOSIS — J441 Chronic obstructive pulmonary disease with (acute) exacerbation: Secondary | ICD-10-CM | POA: Diagnosis not present

## 2020-03-03 ENCOUNTER — Other Ambulatory Visit: Payer: Self-pay | Admitting: Family Medicine

## 2020-03-03 DIAGNOSIS — F331 Major depressive disorder, recurrent, moderate: Secondary | ICD-10-CM

## 2020-03-04 ENCOUNTER — Telehealth: Payer: Self-pay

## 2020-03-04 NOTE — Telephone Encounter (Signed)
Patient aware and states it was COVID infusion. Will call where she was diagnosed.

## 2020-03-04 NOTE — Telephone Encounter (Signed)
What type of infusion is she talking about, she is talking about antibody infusion for Covid than what she diagnosed with Covid, when was she diagnosed.  And I do not know that I can place an order for her to have it done at [facility], I do not know that they will listen to me because I am having trouble even getting it at [facility].  If she is talking about a different type of infusion then let me know.

## 2020-03-07 DIAGNOSIS — R0602 Shortness of breath: Secondary | ICD-10-CM | POA: Diagnosis not present

## 2020-03-07 DIAGNOSIS — F32A Depression, unspecified: Secondary | ICD-10-CM | POA: Diagnosis not present

## 2020-03-07 DIAGNOSIS — I469 Cardiac arrest, cause unspecified: Secondary | ICD-10-CM | POA: Diagnosis not present

## 2020-03-07 DIAGNOSIS — R0902 Hypoxemia: Secondary | ICD-10-CM | POA: Diagnosis not present

## 2020-03-07 DIAGNOSIS — D696 Thrombocytopenia, unspecified: Secondary | ICD-10-CM | POA: Diagnosis not present

## 2020-03-07 DIAGNOSIS — I499 Cardiac arrhythmia, unspecified: Secondary | ICD-10-CM | POA: Diagnosis not present

## 2020-03-07 DIAGNOSIS — Z7951 Long term (current) use of inhaled steroids: Secondary | ICD-10-CM | POA: Diagnosis not present

## 2020-03-07 DIAGNOSIS — R6520 Severe sepsis without septic shock: Secondary | ICD-10-CM | POA: Diagnosis not present

## 2020-03-07 DIAGNOSIS — J1282 Pneumonia due to coronavirus disease 2019: Secondary | ICD-10-CM | POA: Diagnosis not present

## 2020-03-07 DIAGNOSIS — J811 Chronic pulmonary edema: Secondary | ICD-10-CM | POA: Diagnosis not present

## 2020-03-07 DIAGNOSIS — U071 COVID-19: Secondary | ICD-10-CM | POA: Diagnosis not present

## 2020-03-07 DIAGNOSIS — N179 Acute kidney failure, unspecified: Secondary | ICD-10-CM | POA: Diagnosis not present

## 2020-03-07 DIAGNOSIS — R531 Weakness: Secondary | ICD-10-CM | POA: Diagnosis not present

## 2020-03-07 DIAGNOSIS — R079 Chest pain, unspecified: Secondary | ICD-10-CM | POA: Diagnosis not present

## 2020-03-07 DIAGNOSIS — Z743 Need for continuous supervision: Secondary | ICD-10-CM | POA: Diagnosis not present

## 2020-03-07 DIAGNOSIS — J440 Chronic obstructive pulmonary disease with acute lower respiratory infection: Secondary | ICD-10-CM | POA: Diagnosis not present

## 2020-03-07 DIAGNOSIS — I10 Essential (primary) hypertension: Secondary | ICD-10-CM | POA: Diagnosis not present

## 2020-03-07 DIAGNOSIS — R0689 Other abnormalities of breathing: Secondary | ICD-10-CM | POA: Diagnosis not present

## 2020-03-07 DIAGNOSIS — Z515 Encounter for palliative care: Secondary | ICD-10-CM | POA: Diagnosis not present

## 2020-03-07 DIAGNOSIS — J9601 Acute respiratory failure with hypoxia: Secondary | ICD-10-CM | POA: Diagnosis not present

## 2020-03-07 DIAGNOSIS — A4189 Other specified sepsis: Secondary | ICD-10-CM | POA: Diagnosis not present

## 2020-03-07 DIAGNOSIS — Z66 Do not resuscitate: Secondary | ICD-10-CM | POA: Diagnosis not present

## 2020-03-07 DIAGNOSIS — Z87891 Personal history of nicotine dependence: Secondary | ICD-10-CM | POA: Diagnosis not present

## 2020-03-09 DIAGNOSIS — R0602 Shortness of breath: Secondary | ICD-10-CM | POA: Diagnosis not present

## 2020-03-09 DIAGNOSIS — R079 Chest pain, unspecified: Secondary | ICD-10-CM | POA: Diagnosis not present

## 2020-03-12 ENCOUNTER — Telehealth: Payer: Self-pay

## 2020-03-26 ENCOUNTER — Ambulatory Visit: Payer: Medicare Other | Admitting: Family Medicine

## 2020-04-01 DEATH — deceased

## 2020-05-20 ENCOUNTER — Ambulatory Visit (INDEPENDENT_AMBULATORY_CARE_PROVIDER_SITE_OTHER): Payer: Medicare Other | Admitting: Internal Medicine
